# Patient Record
Sex: Male | Born: 1945 | Race: White | Hispanic: No | Marital: Married | Smoking: Former smoker
Health system: Southern US, Community
[De-identification: ages and names within clinical notes are randomized; demographics above are authoritative.]

## PROBLEM LIST (undated history)

## (undated) DIAGNOSIS — I779 Disorder of arteries and arterioles, unspecified: Secondary | ICD-10-CM

## (undated) DIAGNOSIS — E119 Type 2 diabetes mellitus without complications: Secondary | ICD-10-CM

## (undated) DIAGNOSIS — I739 Peripheral vascular disease, unspecified: Secondary | ICD-10-CM

## (undated) DIAGNOSIS — I10 Essential (primary) hypertension: Secondary | ICD-10-CM

## (undated) DIAGNOSIS — I519 Heart disease, unspecified: Secondary | ICD-10-CM

## (undated) DIAGNOSIS — Z862 Personal history of diseases of the blood and blood-forming organs and certain disorders involving the immune mechanism: Secondary | ICD-10-CM

## (undated) DIAGNOSIS — I119 Hypertensive heart disease without heart failure: Secondary | ICD-10-CM

## (undated) DIAGNOSIS — I77811 Abdominal aortic ectasia: Secondary | ICD-10-CM

## (undated) DIAGNOSIS — I2510 Atherosclerotic heart disease of native coronary artery without angina pectoris: Secondary | ICD-10-CM

## (undated) DIAGNOSIS — Z720 Tobacco use: Secondary | ICD-10-CM

## (undated) DIAGNOSIS — Z9289 Personal history of other medical treatment: Secondary | ICD-10-CM

## (undated) DIAGNOSIS — E785 Hyperlipidemia, unspecified: Secondary | ICD-10-CM

## (undated) DIAGNOSIS — Z9889 Other specified postprocedural states: Secondary | ICD-10-CM

## (undated) HISTORY — DX: Essential (primary) hypertension: I10

## (undated) HISTORY — PX: CARDIAC CATHETERIZATION: SHX172

## (undated) HISTORY — PX: CORONARY STENT PLACEMENT: SHX1402

## (undated) HISTORY — PX: CORONARY ANGIOPLASTY: SHX604

## (undated) HISTORY — DX: Abdominal aortic ectasia: I77.811

## (undated) HISTORY — DX: Atherosclerotic heart disease of native coronary artery without angina pectoris: I25.10

## (undated) HISTORY — DX: Personal history of other medical treatment: Z92.89

## (undated) HISTORY — DX: Type 2 diabetes mellitus without complications: E11.9

## (undated) HISTORY — PX: CAROTID ENDARTERECTOMY: SUR193

## (undated) HISTORY — DX: Personal history of diseases of the blood and blood-forming organs and certain disorders involving the immune mechanism: Z86.2

## (undated) HISTORY — DX: Heart disease, unspecified: I51.9

## (undated) HISTORY — DX: Other specified postprocedural states: Z98.890

## (undated) HISTORY — DX: Hyperlipidemia, unspecified: E78.5

---

## 2013-04-07 DIAGNOSIS — Z8679 Personal history of other diseases of the circulatory system: Secondary | ICD-10-CM | POA: Diagnosis not present

## 2013-04-07 DIAGNOSIS — I10 Essential (primary) hypertension: Secondary | ICD-10-CM | POA: Diagnosis not present

## 2013-04-07 DIAGNOSIS — I252 Old myocardial infarction: Secondary | ICD-10-CM | POA: Diagnosis not present

## 2013-04-07 DIAGNOSIS — Z125 Encounter for screening for malignant neoplasm of prostate: Secondary | ICD-10-CM | POA: Diagnosis not present

## 2013-04-07 DIAGNOSIS — I2510 Atherosclerotic heart disease of native coronary artery without angina pectoris: Secondary | ICD-10-CM | POA: Diagnosis not present

## 2013-04-07 DIAGNOSIS — E7800 Pure hypercholesterolemia, unspecified: Secondary | ICD-10-CM | POA: Diagnosis not present

## 2013-04-07 DIAGNOSIS — R7309 Other abnormal glucose: Secondary | ICD-10-CM | POA: Diagnosis not present

## 2013-04-10 DIAGNOSIS — E119 Type 2 diabetes mellitus without complications: Secondary | ICD-10-CM | POA: Diagnosis not present

## 2013-04-18 ENCOUNTER — Ambulatory Visit (INDEPENDENT_AMBULATORY_CARE_PROVIDER_SITE_OTHER): Payer: Medicare Other | Admitting: Internal Medicine

## 2013-04-18 ENCOUNTER — Encounter: Payer: Self-pay | Admitting: Internal Medicine

## 2013-04-18 VITALS — BP 120/86 | HR 73 | Ht 71.0 in | Wt 235.4 lb

## 2013-04-18 DIAGNOSIS — I6529 Occlusion and stenosis of unspecified carotid artery: Secondary | ICD-10-CM | POA: Diagnosis not present

## 2013-04-18 DIAGNOSIS — E785 Hyperlipidemia, unspecified: Secondary | ICD-10-CM

## 2013-04-18 DIAGNOSIS — I2510 Atherosclerotic heart disease of native coronary artery without angina pectoris: Secondary | ICD-10-CM

## 2013-04-18 DIAGNOSIS — I6521 Occlusion and stenosis of right carotid artery: Secondary | ICD-10-CM

## 2013-04-18 NOTE — Patient Instructions (Addendum)
Your physician recommends that you schedule a follow-up appointment WU:XLKG  WITH DR ROSS Your physician recommends that you continue on your current medications as directed. Please refer to the Current Medication list given to you today.  Your physician has requested that you have a lexiscan myoview. For further information please visit [email or web address] Please follow instruction sheet, as given.  Your physician has requested that you have a carotid duplex. This test is an ultrasound of the carotid arteries in your neck. It looks at blood flow through these arteries that supply the brain with blood. Allow one hour for this exam. There are no restrictions or special instructions.

## 2013-04-18 NOTE — Progress Notes (Signed)
HPI Patient is a 67 yo who presents for continued cardiac care  He was referred by Dr Zachery Dauer at [place] He has a history of CAD  While in [place] he had an MI in 2012.   He remembers one Friday having chest pressure  Took ASA  On Saturday and Sunday pressure continued  On Mon when he went to work he was checked out  Went to local hospital and then transferred to the [facility]. He was told he had a small amount of damage to his heart  He stayed in the ICU for 1 wk and then had a cardiac cath  He received 2 stents the the artery on the right side of his heart.  Does not know about the other vessels.   He was followed by the head of cardiology after  Had an echo  Was last seen in clinic in March 2014 He denies chest pressure  Does get tired with activty  No PND  No signif palpitations Continues to smoke  Cut back to 1/2 ppd   No Known Allergies  Current Outpatient Prescriptions  Medication Sig Dispense Refill  . aspirin 81 MG tablet Take 81 mg by mouth daily.      . irbesartan (AVAPRO) 150 MG tablet Take 150 mg by mouth daily.      . rosuvastatin (CRESTOR) 10 MG tablet Take 10 mg by mouth daily.       No current facility-administered medications for this visit.    No past medical history on file.  No past surgical history on file.  No family history on file.  History   Social History  . Marital Status: Married    Spouse Name: N/A    Number of Children: N/A  . Years of Education: N/A   Occupational History  . Not on file.   Social History Main Topics  . Smoking status: Current Every Day Smoker -- 0.50 packs/day for 40 years    Types: Cigarettes    Start date: 06/18/1972  . Smokeless tobacco: Not on file  . Alcohol Use: Not on file  . Drug Use: Not on file  . Sexual Activity: Not on file   Other Topics Concern  . Not on file   Social History Narrative  . No narrative on file    Review of Systems:  All systems reviewed.  They are negative to the above  problem except as previously stated.  Vital Signs: BP 120/86  Pulse 73  Ht 5\' 11"  (1.803 m)  Wt 235 lb 6.4 oz (106.777 kg)  BMI 32.85 kg/m2  Physical Exam Patient is in NAD HEENT:  Normocephalic, atraumatic. EOMI, PERRLA.  Neck: JVP is normal.  No bruits. Scar R CEA Lungs: clear to auscultation. No rales no wheezes.  Heart: Regular rate and rhythm. Normal S1, S2. No S3.   No significant murmurs. PMI not displaced.  Abdomen:  Supple, nontender. Normal bowel sounds. No masses. No hepatomegaly.  Extremities:   Good distal pulses throughout. No lower extremity edema.  Musculoskeletal :moving all extremities.  Neuro:   alert and oriented x3.  CN II-XII grossly intact.  EKG  SR 73   Assessment and Plan:  1.  CAd  Patient with stent x 2 two years ago.  He denies CP but does give out with activtuy  I would get a stress myoview to look for residual ischemia.  Keep on same meds  If normal i would ecourage him to get more active.  Consider echo if EF down  2.  PVOD  Will get carotid USN  Had CEA in [place] in 2006  No F/U there after Would get abd USN to screen for AAA since he is obese  3.  HL  Lipids are pretty good  LDL 80  Encouraged wt loss and exercise  4.  HTN  Good control  5.  Tobacco  COunselled on quitting  He has cut back  He will discuss with Dr Zachery Dauer Chantix.

## 2013-04-21 ENCOUNTER — Ambulatory Visit (HOSPITAL_COMMUNITY): Payer: Medicare Other | Attending: Internal Medicine

## 2013-04-21 DIAGNOSIS — I6529 Occlusion and stenosis of unspecified carotid artery: Secondary | ICD-10-CM | POA: Insufficient documentation

## 2013-04-21 DIAGNOSIS — I6521 Occlusion and stenosis of right carotid artery: Secondary | ICD-10-CM

## 2013-04-22 DIAGNOSIS — D72829 Elevated white blood cell count, unspecified: Secondary | ICD-10-CM | POA: Diagnosis not present

## 2013-04-22 DIAGNOSIS — E119 Type 2 diabetes mellitus without complications: Secondary | ICD-10-CM | POA: Diagnosis not present

## 2013-04-22 DIAGNOSIS — E875 Hyperkalemia: Secondary | ICD-10-CM | POA: Diagnosis not present

## 2013-04-28 ENCOUNTER — Telehealth: Payer: Self-pay | Admitting: Internal Medicine

## 2013-04-28 DIAGNOSIS — Z9289 Personal history of other medical treatment: Secondary | ICD-10-CM

## 2013-04-28 HISTORY — DX: Personal history of other medical treatment: Z92.89

## 2013-04-28 NOTE — Telephone Encounter (Signed)
C/D 04/28/13 for appt. 05/26/13

## 2013-04-28 NOTE — Telephone Encounter (Signed)
S/W PT AND GVE NP APPT 12/29 @ 1:30 W/DR. CHISM REFERRING DR. BARNES DX- ELEVATED WBC  WELCOME PACKET MAILED,.

## 2013-05-05 ENCOUNTER — Telehealth: Payer: Self-pay | Admitting: Internal Medicine

## 2013-05-05 NOTE — Telephone Encounter (Signed)
New message    Coming in wed for nuclear stress test---pcp changed bp medication last thurs----b/p is 148/79 which is high for him.  Should he wait and have stress test later when his bp is more stable?

## 2013-05-05 NOTE — Telephone Encounter (Signed)
Spoke with pt, his medical doctor changed his avapro to metoprolol succ 25 mg daily due to hyperkalemia. His bp is running in the 140's/70's. He wants to [facility] sure okay to cont with the stress testing this week. Will discuss with dr Tenny Craw.

## 2013-05-06 NOTE — Telephone Encounter (Signed)
Left message for pt, discussed with dr Tenny Craw, she would like the pt to have his stress test tomorrow. He will take his metoprolol in the morning for bp control.

## 2013-05-07 ENCOUNTER — Ambulatory Visit (HOSPITAL_COMMUNITY): Payer: Medicare Other | Attending: Internal Medicine | Admitting: Radiology

## 2013-05-07 VITALS — BP 122/73 | Ht 71.0 in | Wt 233.0 lb

## 2013-05-07 DIAGNOSIS — E785 Hyperlipidemia, unspecified: Secondary | ICD-10-CM | POA: Insufficient documentation

## 2013-05-07 DIAGNOSIS — R5381 Other malaise: Secondary | ICD-10-CM | POA: Insufficient documentation

## 2013-05-07 DIAGNOSIS — F17200 Nicotine dependence, unspecified, uncomplicated: Secondary | ICD-10-CM | POA: Insufficient documentation

## 2013-05-07 DIAGNOSIS — I2510 Atherosclerotic heart disease of native coronary artery without angina pectoris: Secondary | ICD-10-CM

## 2013-05-07 DIAGNOSIS — I779 Disorder of arteries and arterioles, unspecified: Secondary | ICD-10-CM | POA: Insufficient documentation

## 2013-05-07 DIAGNOSIS — I252 Old myocardial infarction: Secondary | ICD-10-CM | POA: Diagnosis not present

## 2013-05-07 DIAGNOSIS — I10 Essential (primary) hypertension: Secondary | ICD-10-CM | POA: Insufficient documentation

## 2013-05-07 MED ORDER — TECHNETIUM TC 99M SESTAMIBI GENERIC - CARDIOLITE
11.0000 | Freq: Once | INTRAVENOUS | Status: AC | PRN
  Administered 2013-05-07: 11 via INTRAVENOUS

## 2013-05-07 MED ORDER — TECHNETIUM TC 99M SESTAMIBI GENERIC - CARDIOLITE
33.0000 | Freq: Once | INTRAVENOUS | Status: AC | PRN
  Administered 2013-05-07: 33 via INTRAVENOUS

## 2013-05-07 MED ORDER — REGADENOSON 0.4 MG/5ML IV SOLN
0.4000 mg | Freq: Once | INTRAVENOUS | Status: AC
  Administered 2013-05-07: 0.4 mg via INTRAVENOUS

## 2013-05-07 NOTE — Progress Notes (Signed)
[facility] SITE 3 NUCLEAR MED [address] [place], [place] [address] [phone]    Cardiology Nuclear Med Study  Randy [name] is a 67 y.o. male     MRN : [id number]     DOB: 05/27/1946  Procedure Date: 05/07/2013  Nuclear Med Background Indication for Stress Test:  Evaluation for Ischemia and Stent Patency History:  1990 MPI: [place] MPI: [facility] some blockage per pt, Cath: '12 [place] -Stent Multiple Cardiac Risk Factors: Carotid Disease, Hypertension, Lipids and Smoker  Symptoms:  Fatigue   Nuclear Pre-Procedure Caffeine/Decaff Intake:  None > 12 hrs NPO After: 9:00pm   Lungs:  clear O2 Sat: 99% on room air. IV 0.9% NS with Angio Cath:  22g  IV Site: L Forearm x 1, tolerated well IV Started by:  Randy Hong, RN  Chest Size (in):  46 Cup Size: n/a  Height: 5\' 11"  (1.803 m)  Weight:  233 lb (105.688 kg)  BMI:  Body mass index is 32.51 kg/(m^2). Tech Comments:  Took am medications    Nuclear Med Study 1 or 2 day study: 1 day  Stress Test Type:  Lexiscan  Reading MD: Randy Miss, MD  Order Authorizing Provider:  Dietrich Pates, MD  Resting Radionuclide: Technetium 51m Sestamibi  Resting Radionuclide Dose: 11.0 mCi   Stress Radionuclide:  Technetium 28m Sestamibi  Stress Radionuclide Dose: 33.0 mCi           Stress Protocol Rest HR: 57 Stress HR: 76  Rest BP: 122/73 Stress BP: 147/91  Exercise Time (min): n/a METS: n/a   Predicted Max HR: 153 bpm % Max HR: 49.67 bpm Rate Pressure Product: 21308   Dose of Adenosine (mg):  n/a Dose of Lexiscan: 0.4 mg  Dose of Atropine (mg): n/a Dose of Dobutamine: n/a mcg/kg/min (at max HR)  Stress Test Technologist: Randy [name], EMT-P  Nuclear Technologist:  Randy [name], CNMT     Rest Procedure:  Myocardial perfusion imaging was performed at rest 45 minutes following the intravenous administration of Technetium 81m Sestamibi. Rest ECG: NSR - Normal EKG  Stress Procedure:  The patient received IV  Lexiscan 0.4 mg over 15-seconds.  Technetium 52m Sestamibi injected at 30-seconds. This patient was sob and felt funny with the Lexiscan injection. Quantitative spect images were obtained after a 45 minute delay. Stress ECG: No significant change from baseline ECG  QPS Raw Data Images:  Normal; no motion artifact; normal heart/lung ratio. Stress Images:  The LV is moderately dilated.  There is a large severe defect in the mid /basal inferiolateral wall.   the uptake in the other regions is normal.    Rest Images:  The LV is moderately dilated.  There is a large severe defect in the mid /basal inferiolateral wall.   the uptake in the other regions is normal. Subtraction (SDS):  No evidence of ischemia.  There is evidence of a previous inferiolateral MI  Transient Ischemic Dilatation (Normal <1.22):  1.00 Lung/Heart Ratio (Normal <0.45):  0.35  Quantitative Gated Spect Images QGS EDV:  150 ml QGS ESV:  85 ml  Impression Exercise Capacity:  Lexiscan with no exercise. BP Response:  Normal blood pressure response. Clinical Symptoms:  No significant symptoms noted. ECG Impression:  No significant ST segment change suggestive of ischemia. Comparison with Prior Nuclear Study: No images to compare  Overall Impression:  Intermediate risk stress nuclear study .  There is evidence of a previous inferiolateral MI.  There is no ischemia.  The  LV function is mildlly depressed and the LV is moderately enlarged.   There is hypokinesis of the inferiolateral wall. .  LV Ejection Fraction: 45%.  LV Wall Motion:  The LV function is mildlly depressed and the LV is moderately enlarged.   There is hypokinesis of the inferiolateral wall. Randy [name], Randy [name]., MD, [facility] 05/07/2013, 4:34 PM Office - [phone] Pager [phone]

## 2013-05-12 ENCOUNTER — Other Ambulatory Visit: Payer: Self-pay | Admitting: *Deleted

## 2013-05-12 DIAGNOSIS — R9430 Abnormal result of cardiovascular function study, unspecified: Secondary | ICD-10-CM

## 2013-05-15 DIAGNOSIS — E875 Hyperkalemia: Secondary | ICD-10-CM | POA: Diagnosis not present

## 2013-05-26 ENCOUNTER — Ambulatory Visit: Payer: No Typology Code available for payment source

## 2013-05-26 ENCOUNTER — Other Ambulatory Visit: Payer: No Typology Code available for payment source

## 2013-05-27 ENCOUNTER — Encounter: Payer: Self-pay | Admitting: Cardiovascular Disease

## 2013-05-27 ENCOUNTER — Ambulatory Visit (HOSPITAL_COMMUNITY): Payer: Medicare Other | Attending: Cardiovascular Disease | Admitting: Radiology

## 2013-05-27 DIAGNOSIS — I079 Rheumatic tricuspid valve disease, unspecified: Secondary | ICD-10-CM | POA: Diagnosis not present

## 2013-05-27 DIAGNOSIS — R9430 Abnormal result of cardiovascular function study, unspecified: Secondary | ICD-10-CM

## 2013-05-27 DIAGNOSIS — I2510 Atherosclerotic heart disease of native coronary artery without angina pectoris: Secondary | ICD-10-CM | POA: Diagnosis not present

## 2013-05-27 DIAGNOSIS — I080 Rheumatic disorders of both mitral and aortic valves: Secondary | ICD-10-CM | POA: Insufficient documentation

## 2013-05-27 DIAGNOSIS — I679 Cerebrovascular disease, unspecified: Secondary | ICD-10-CM | POA: Insufficient documentation

## 2013-05-27 DIAGNOSIS — F17200 Nicotine dependence, unspecified, uncomplicated: Secondary | ICD-10-CM | POA: Diagnosis not present

## 2013-05-27 DIAGNOSIS — I10 Essential (primary) hypertension: Secondary | ICD-10-CM | POA: Diagnosis not present

## 2013-05-27 DIAGNOSIS — E785 Hyperlipidemia, unspecified: Secondary | ICD-10-CM | POA: Insufficient documentation

## 2013-05-27 DIAGNOSIS — I379 Nonrheumatic pulmonary valve disorder, unspecified: Secondary | ICD-10-CM | POA: Insufficient documentation

## 2013-05-27 NOTE — Progress Notes (Signed)
Echocardiogram performed.  

## 2013-06-03 ENCOUNTER — Other Ambulatory Visit (HOSPITAL_COMMUNITY): Payer: No Typology Code available for payment source

## 2013-10-23 DIAGNOSIS — I252 Old myocardial infarction: Secondary | ICD-10-CM | POA: Diagnosis not present

## 2013-10-23 DIAGNOSIS — I10 Essential (primary) hypertension: Secondary | ICD-10-CM | POA: Diagnosis not present

## 2013-10-23 DIAGNOSIS — Z8679 Personal history of other diseases of the circulatory system: Secondary | ICD-10-CM | POA: Diagnosis not present

## 2013-10-23 DIAGNOSIS — E7800 Pure hypercholesterolemia, unspecified: Secondary | ICD-10-CM | POA: Diagnosis not present

## 2013-10-23 DIAGNOSIS — E875 Hyperkalemia: Secondary | ICD-10-CM | POA: Diagnosis not present

## 2013-10-23 DIAGNOSIS — E119 Type 2 diabetes mellitus without complications: Secondary | ICD-10-CM | POA: Diagnosis not present

## 2013-10-23 DIAGNOSIS — D72829 Elevated white blood cell count, unspecified: Secondary | ICD-10-CM | POA: Diagnosis not present

## 2014-03-03 ENCOUNTER — Encounter: Payer: Self-pay | Admitting: Internal Medicine

## 2014-04-03 ENCOUNTER — Telehealth: Payer: Self-pay | Admitting: Internal Medicine

## 2014-04-03 NOTE — Telephone Encounter (Signed)
Advised will discuss with Dr. Harrington Challenger and will let him know.

## 2014-04-03 NOTE — Telephone Encounter (Signed)
New Message  Pt called requets a call back to discuss why a carotid is needed. Please call

## 2014-04-03 NOTE — Telephone Encounter (Signed)
Reviewed need for carotid doppler with patient. He wants to know if Dr. Harrington Challenger would like any other test done prior to his appointment next month.

## 2014-04-07 NOTE — Telephone Encounter (Signed)
Pt advised due for carotid doppler after 04/21/14 this year (1 year from last carotid) , transferred to [facility] scheduler to schedule carotid prior to 05/11/14 appt with Dr Harrington Challenger.  Pt states he will discuss need for further testing with Dr Harrington Challenger at time of appt with her 05/11/14

## 2014-04-07 NOTE — Telephone Encounter (Signed)
Follow up      Pt want to talk to the nurse---he did not hear back from her from a few days ago

## 2014-04-14 DIAGNOSIS — I10 Essential (primary) hypertension: Secondary | ICD-10-CM | POA: Diagnosis not present

## 2014-04-14 DIAGNOSIS — E780 Pure hypercholesterolemia: Secondary | ICD-10-CM | POA: Diagnosis not present

## 2014-04-14 DIAGNOSIS — I252 Old myocardial infarction: Secondary | ICD-10-CM | POA: Diagnosis not present

## 2014-04-14 DIAGNOSIS — Z8679 Personal history of other diseases of the circulatory system: Secondary | ICD-10-CM | POA: Diagnosis not present

## 2014-04-14 DIAGNOSIS — E119 Type 2 diabetes mellitus without complications: Secondary | ICD-10-CM | POA: Diagnosis not present

## 2014-04-14 DIAGNOSIS — I2510 Atherosclerotic heart disease of native coronary artery without angina pectoris: Secondary | ICD-10-CM | POA: Diagnosis not present

## 2014-04-15 DIAGNOSIS — Z23 Encounter for immunization: Secondary | ICD-10-CM | POA: Diagnosis not present

## 2014-04-15 DIAGNOSIS — R7989 Other specified abnormal findings of blood chemistry: Secondary | ICD-10-CM | POA: Diagnosis not present

## 2014-04-15 DIAGNOSIS — Z1211 Encounter for screening for malignant neoplasm of colon: Secondary | ICD-10-CM | POA: Diagnosis not present

## 2014-04-15 DIAGNOSIS — E875 Hyperkalemia: Secondary | ICD-10-CM | POA: Diagnosis not present

## 2014-04-17 ENCOUNTER — Other Ambulatory Visit (HOSPITAL_COMMUNITY): Payer: Self-pay | Admitting: *Deleted

## 2014-04-17 DIAGNOSIS — I6523 Occlusion and stenosis of bilateral carotid arteries: Secondary | ICD-10-CM

## 2014-05-04 ENCOUNTER — Encounter (HOSPITAL_COMMUNITY): Payer: No Typology Code available for payment source

## 2014-05-11 ENCOUNTER — Ambulatory Visit: Payer: No Typology Code available for payment source | Admitting: Internal Medicine

## 2014-06-05 ENCOUNTER — Ambulatory Visit (HOSPITAL_COMMUNITY): Payer: Medicare Other | Attending: Cardiovascular Disease | Admitting: *Deleted

## 2014-06-05 DIAGNOSIS — I6523 Occlusion and stenosis of bilateral carotid arteries: Secondary | ICD-10-CM

## 2014-06-05 NOTE — Progress Notes (Signed)
Carotid Duplex Exam Performed 

## 2014-06-12 ENCOUNTER — Ambulatory Visit (INDEPENDENT_AMBULATORY_CARE_PROVIDER_SITE_OTHER): Payer: Medicare Other | Admitting: Internal Medicine

## 2014-06-12 ENCOUNTER — Encounter: Payer: Self-pay | Admitting: Internal Medicine

## 2014-06-12 VITALS — BP 162/88 | HR 58 | Ht 71.5 in | Wt 222.0 lb

## 2014-06-12 DIAGNOSIS — I6523 Occlusion and stenosis of bilateral carotid arteries: Secondary | ICD-10-CM | POA: Diagnosis not present

## 2014-06-12 DIAGNOSIS — I10 Essential (primary) hypertension: Secondary | ICD-10-CM

## 2014-06-12 DIAGNOSIS — E785 Hyperlipidemia, unspecified: Secondary | ICD-10-CM | POA: Diagnosis not present

## 2014-06-12 NOTE — Patient Instructions (Signed)
**Note De-Identified  Obfuscation** Your physician recommends that you continue on your current medications as directed. Please refer to the Current Medication list given to you today.  Your physician recommends that you schedule a follow-up appointment in: BP check in 1 month  Your physician wants you to follow-up in: 1 year. You will receive a reminder letter in the mail two months in advance. If you don't receive a letter, please call our office to schedule the follow-up appointment.

## 2014-06-12 NOTE — Progress Notes (Signed)
HPI Patient is a 55 with history of CAD  S/P MI in 2012 in [place]    After I saw him last he had a myoview scan that showed inferior scar.  Echo dones that showed LVEF was normal   Since seen he denies CP  Breathing is OK   Seen by Dr Drema Dallas recently  Labs done.   Still smoking 1/2 ppd     No Known Allergies  Current Outpatient Prescriptions  Medication Sig Dispense Refill  . aspirin 81 MG tablet Take 81 mg by mouth daily.    . metoprolol succinate (TOPROL-XL) 25 MG 24 hr tablet Take 25 mg by mouth daily.    . rosuvastatin (CRESTOR) 10 MG tablet Take 10 mg by mouth daily.     No current facility-administered medications for this visit.    Past Medical History  Diagnosis Date  . Carotid artery occlusion   . Coronary artery disease   . Hypertension   . Hyperlipidemia     Past Surgical History  Procedure Laterality Date  . Cardiac catheterization    . Carotid endarterectomy    . Coronary angioplasty      Family History  Problem Relation Age of Onset  . Hypertension Father     History   Social History  . Marital Status: Married    Spouse Name: ardith    Number of Children: 7  . Years of Education: college   Occupational History  . retired    Social History Main Topics  . Smoking status: Current Every Day Smoker -- 0.50 packs/day for 40 years    Types: Cigarettes    Start date: 06/18/1972  . Smokeless tobacco: Not on file  . Alcohol Use: No  . Drug Use: No  . Sexual Activity: Not on file   Other Topics Concern  . Not on file   Social History Narrative    Review of Systems:  All systems reviewed.  They are negative to the above problem except as previously stated.  Vital Signs: BP 162/88 mmHg  Pulse 58  Ht 5' 11.5" (1.816 m)  Wt 222 lb (100.699 kg)  BMI 30.53 kg/m2  Physical Exam Patient is in NAD HEENT:  Normocephalic, atraumatic. EOMI, PERRLA.  Neck: JVP is normal.  No bruits. Scar R CEA Lungs: clear to auscultation. No rales no wheezes.   Heart: Regular rate and rhythm. Normal S1, S2. No S3.   No significant murmurs. PMI not displaced.  Abdomen:  Supple, nontender. Normal bowel sounds. No masses. No hepatomegaly.  Extremities:   Good distal pulses throughout. No lower extremity edema.  Musculoskeletal :moving all extremities.  Neuro:   alert and oriented x3.  CN II-XII grossly intact. Assessment and Plan:  1.  CAd  Patient with stent x 2 two years ago.Myoview wint inferior scar  LVEF normal  FOllow   2.  PVOD  F/U carotid USN in 1 year  3.  HL  Will get labs from Dr Drema Dallas' office  4.  HTN BP is high  Will have him take at home  F?U in 1 month  Bring log    5.  Tobacco  COunselled again on smoking cessation

## 2014-07-13 ENCOUNTER — Ambulatory Visit (INDEPENDENT_AMBULATORY_CARE_PROVIDER_SITE_OTHER): Payer: Medicare Other

## 2014-07-13 VITALS — BP 170/80 | HR 65 | Resp 20 | Ht 71.5 in | Wt 224.0 lb

## 2014-07-13 DIAGNOSIS — I10 Essential (primary) hypertension: Secondary | ICD-10-CM | POA: Diagnosis not present

## 2014-07-13 NOTE — Patient Instructions (Signed)
Your physician recommends that you continue on your current medications as directed. Please refer to the Current Medication list given to you today.     

## 2014-07-13 NOTE — Progress Notes (Signed)
1.) Reason for visit: BP check  2.) Name of MD requesting visit: Dr. Harrington Challenger  3.) H&P: Patient has a history CAD, carotid artery occlusion, HTN, and hyperlipidemia. Patient's vital signs BP 170/80, HR 65, and O2 100% on room air. Patient denies any chest pain or SOB.   4.) ROS related to problem: Patient's BP is elevated here at the office, but was 126/82 this am with HR 72. Patient stated that he had 3 cups of strong coffee this am, plus the weather is bad with snow last night.   5.) Assessment and plan per MD: Showed vital signs to Truitt Merle NP (FLEX), after reviewing patient's latest record of BPs in the last month from home, she would not make any changes. Elevated BP maybe due to coming out in this weather and being in the doctor's office. Will forward to Dr. Harrington Challenger for further instructions.

## 2014-07-13 NOTE — Progress Notes (Signed)
Patient came in fro BP check  Our cuff and his cuff with fair correlation BP good at home  I would recomm that he continue to keep log. He should bring in his cuff and bp log when comes into clinic again.

## 2014-10-15 ENCOUNTER — Other Ambulatory Visit: Payer: Self-pay | Admitting: Family Medicine

## 2014-10-15 DIAGNOSIS — E780 Pure hypercholesterolemia: Secondary | ICD-10-CM | POA: Diagnosis not present

## 2014-10-15 DIAGNOSIS — Z1389 Encounter for screening for other disorder: Secondary | ICD-10-CM | POA: Diagnosis not present

## 2014-10-15 DIAGNOSIS — I10 Essential (primary) hypertension: Secondary | ICD-10-CM | POA: Diagnosis not present

## 2014-10-15 DIAGNOSIS — R0989 Other specified symptoms and signs involving the circulatory and respiratory systems: Secondary | ICD-10-CM | POA: Diagnosis not present

## 2014-10-15 DIAGNOSIS — E119 Type 2 diabetes mellitus without complications: Secondary | ICD-10-CM

## 2014-10-19 ENCOUNTER — Ambulatory Visit
Admission: RE | Admit: 2014-10-19 | Discharge: 2014-10-19 | Disposition: A | Discharge status: Home / Self Care | Payer: Medicare Other | Source: Ambulatory Visit | Attending: Family Medicine | Admitting: Family Medicine

## 2014-10-19 DIAGNOSIS — E119 Type 2 diabetes mellitus without complications: Secondary | ICD-10-CM

## 2014-10-19 DIAGNOSIS — R0989 Other specified symptoms and signs involving the circulatory and respiratory systems: Secondary | ICD-10-CM | POA: Diagnosis not present

## 2015-05-17 ENCOUNTER — Other Ambulatory Visit: Payer: Self-pay | Admitting: Internal Medicine

## 2015-05-17 DIAGNOSIS — I6523 Occlusion and stenosis of bilateral carotid arteries: Secondary | ICD-10-CM

## 2015-06-07 ENCOUNTER — Inpatient Hospital Stay (HOSPITAL_COMMUNITY): Admission: RE | Admit: 2015-06-07 | Payer: Medicare Other | Source: Ambulatory Visit

## 2015-06-08 ENCOUNTER — Ambulatory Visit (HOSPITAL_COMMUNITY)
Admission: RE | Admit: 2015-06-08 | Discharge: 2015-06-08 | Disposition: A | Discharge status: Home / Self Care | Payer: Medicare Other | Source: Ambulatory Visit | Attending: Cardiology | Admitting: Cardiology

## 2015-06-08 DIAGNOSIS — E785 Hyperlipidemia, unspecified: Secondary | ICD-10-CM | POA: Insufficient documentation

## 2015-06-08 DIAGNOSIS — I10 Essential (primary) hypertension: Secondary | ICD-10-CM | POA: Diagnosis not present

## 2015-06-08 DIAGNOSIS — I6523 Occlusion and stenosis of bilateral carotid arteries: Secondary | ICD-10-CM

## 2015-07-09 DIAGNOSIS — Z1211 Encounter for screening for malignant neoplasm of colon: Secondary | ICD-10-CM | POA: Diagnosis not present

## 2015-07-09 DIAGNOSIS — E7800 Pure hypercholesterolemia, unspecified: Secondary | ICD-10-CM | POA: Diagnosis not present

## 2015-07-09 DIAGNOSIS — I10 Essential (primary) hypertension: Secondary | ICD-10-CM | POA: Diagnosis not present

## 2015-07-09 DIAGNOSIS — E119 Type 2 diabetes mellitus without complications: Secondary | ICD-10-CM | POA: Diagnosis not present

## 2015-07-16 ENCOUNTER — Ambulatory Visit: Payer: Medicare Other | Admitting: Internal Medicine

## 2015-08-05 ENCOUNTER — Ambulatory Visit: Payer: Medicare Other | Admitting: Internal Medicine

## 2015-08-19 DIAGNOSIS — Z79899 Other long term (current) drug therapy: Secondary | ICD-10-CM | POA: Diagnosis not present

## 2015-08-19 DIAGNOSIS — I10 Essential (primary) hypertension: Secondary | ICD-10-CM | POA: Diagnosis not present

## 2015-08-23 ENCOUNTER — Ambulatory Visit (INDEPENDENT_AMBULATORY_CARE_PROVIDER_SITE_OTHER): Payer: Medicare Other | Admitting: Internal Medicine

## 2015-08-23 ENCOUNTER — Encounter: Payer: Self-pay | Admitting: Internal Medicine

## 2015-08-23 VITALS — BP 138/78 | HR 70 | Ht 71.5 in | Wt 225.0 lb

## 2015-08-23 DIAGNOSIS — I10 Essential (primary) hypertension: Secondary | ICD-10-CM | POA: Diagnosis not present

## 2015-08-23 DIAGNOSIS — I6523 Occlusion and stenosis of bilateral carotid arteries: Secondary | ICD-10-CM | POA: Diagnosis not present

## 2015-08-23 MED ORDER — IRBESARTAN-HYDROCHLOROTHIAZIDE 150-12.5 MG PO TABS: 0.5000 | ORAL_TABLET | Freq: Every day | ORAL | Status: DC

## 2015-08-23 MED ORDER — VARENICLINE TARTRATE 0.5 MG PO TABS: 0.5000 mg | ORAL_TABLET | Freq: Two times a day (BID) | ORAL | Status: DC

## 2015-08-23 MED ORDER — METOPROLOL SUCCINATE ER 25 MG PO TB24: 12.5000 mg | ORAL_TABLET | Freq: Every day | ORAL | Status: DC

## 2015-08-23 NOTE — Patient Instructions (Signed)
Your physician recommends that you continue on your current medications as directed. Please refer to the Current Medication list given to you today. Your physician wants you to follow-up in: 1 year with Dr. Harrington Challenger.  You will receive a reminder letter in the mail two months in advance. If you don't receive a letter, please call our office to schedule the follow-up appointment. Your physician wants you to follow-up in: January, 2018 for carotid doppler.   You will receive a reminder letter in the mail two months in advance. If you don't receive a letter, please call our office to schedule the follow-up appointment.

## 2015-08-23 NOTE — Progress Notes (Signed)
Cardiology Office Note   Date:  08/23/2015   ID:  Randy [name], DOB [birth year], MRN [id number]  PCP:  Gerrit Heck, MD  Cardiologist:   [name], MD   No chief complaint on file.  F/U of CAD     History of Present Illness: Randy [name] is a 70 y.o. male with a history of CAD  S/p MI in 2012 in [place]  Myoview with inferior scar  LVEF on echo was normal  I saw him in Jan 2016 Carotids showed mod plaquing       Just got back from [place]    Had physical with dr Drema Dallas before he left  150/ In [place] BP was very high  Stayed high for a few days   Seen by physcian there Started on  Irbasarten/ HCTZ  150/12.5    Takes 1/2 every day    Takes 1/2 metoprolol  (Toprol XL)  That was started 3 wks ago   Breathing OK  No CP   Had pressure when BP was up   Walking in [place]  Still smoking   Outpatient Prescriptions Prior to Visit  Medication Sig Dispense Refill  . aspirin 81 MG tablet Take 81 mg by mouth daily.    . rosuvastatin (CRESTOR) 10 MG tablet Take 10 mg by mouth daily.    . metoprolol succinate (TOPROL-XL) 25 MG 24 hr tablet Take 25 mg by mouth daily. Reported on 08/23/2015     No facility-administered medications prior to visit.     Allergies:   Review of patient's allergies indicates no known allergies.   Past Medical History  Diagnosis Date  . Carotid artery occlusion   . Coronary artery disease   . Hypertension   . Hyperlipidemia     Past Surgical History  Procedure Laterality Date  . Cardiac catheterization    . Carotid endarterectomy    . Coronary angioplasty       Social History:  The patient  reports that he has been smoking Cigarettes.  He started smoking about 43 years ago. He has a 20 pack-year smoking history. He does not have any smokeless tobacco history on file. He reports that he does not drink alcohol or use illicit drugs.   Family History:  The patient's family history includes Hypertension in his father.    ROS:  Please  see the history of present illness. All other systems are reviewed and  Negative to the above problem except as noted.    PHYSICAL EXAM: VS:  BP 138/78 mmHg  Pulse 70  Ht 5' 11.5" (1.816 m)  Wt 225 lb (102.059 kg)  BMI 30.95 kg/m2  GEN: Well nourished, well developed, in no acute distress HEENT: normal Neck: no JVD, carotid bruits, or masses Cardiac: RRR; no murmurs, rubs, or gallops,no edema  Respiratory:  clear to auscultation bilaterally, normal work of breathing GI: soft, nontender, nondistended, + BS  No hepatomegaly  MS: no deformity Moving all extremities   Skin: warm and dry, no rash Neuro:  Strength and sensation are intact Psych: euthymic mood, full affect   EKG:  EKG is ordered today.  SR 71  IWMI     Lipid Panel No results found for: CHOL, TRIG, HDL, CHOLHDL, VLDL, LDLCALC, LDLDIRECT    Wt Readings from Last 3 Encounters:  08/23/15 225 lb (102.059 kg)  07/13/14 224 lb (101.606 kg)  06/12/14 222 lb (100.699 kg)      ASSESSMENT AND PLAN:  1  CAD.  No symptoms to sugg angina    2  HL   Get lipids from Dr Drema Dallas' office    3  CV dz  F/u carotid USN in 1 year    4.  HTN  Better on current regimen   Follow    5  Tob  COunselled on cessation  Dicussed Chantix  He would like to try this     F/U in 1 year       Signed, [name], MD  08/23/2015 1:55 PM    [facility] [place], [place], [place]  [address] Phone: [phone]; Fax: [phone]

## 2015-08-27 ENCOUNTER — Encounter: Payer: Self-pay | Admitting: Internal Medicine

## 2015-09-02 ENCOUNTER — Other Ambulatory Visit: Payer: Self-pay | Admitting: *Deleted

## 2015-09-02 DIAGNOSIS — I6523 Occlusion and stenosis of bilateral carotid arteries: Secondary | ICD-10-CM

## 2015-09-03 ENCOUNTER — Other Ambulatory Visit (INDEPENDENT_AMBULATORY_CARE_PROVIDER_SITE_OTHER): Payer: Medicare Other | Admitting: *Deleted

## 2015-09-03 DIAGNOSIS — I6523 Occlusion and stenosis of bilateral carotid arteries: Secondary | ICD-10-CM

## 2015-09-03 DIAGNOSIS — I6529 Occlusion and stenosis of unspecified carotid artery: Secondary | ICD-10-CM | POA: Diagnosis not present

## 2015-09-03 LAB — LIPID PANEL
CHOL/HDL RATIO: 3.4 ratio (ref <=5.0)
CHOLESTEROL: 119 mg/dL — AB (ref 125–200)
HDL: 35 mg/dL — ABNORMAL LOW (ref >=40)
LDL Cholesterol: 65 mg/dL (ref <130)
Triglycerides: 97 mg/dL (ref <150)
VLDL: 19 mg/dL (ref <30)

## 2015-09-14 ENCOUNTER — Other Ambulatory Visit: Payer: Medicare Other

## 2015-10-20 ENCOUNTER — Encounter (HOSPITAL_COMMUNITY): Payer: Self-pay | Admitting: Family Medicine

## 2015-10-20 ENCOUNTER — Emergency Department (HOSPITAL_COMMUNITY): Payer: Medicare Other

## 2015-10-20 ENCOUNTER — Inpatient Hospital Stay (HOSPITAL_COMMUNITY)
Admission: EM | Admit: 2015-10-20 | Discharge: 2015-10-22 | DRG: 247 | Disposition: A | Discharge status: Home / Self Care | Payer: Medicare Other | Attending: Internal Medicine | Admitting: Internal Medicine

## 2015-10-20 DIAGNOSIS — R0602 Shortness of breath: Secondary | ICD-10-CM | POA: Diagnosis not present

## 2015-10-20 DIAGNOSIS — I214 Non-ST elevation (NSTEMI) myocardial infarction: Secondary | ICD-10-CM | POA: Diagnosis not present

## 2015-10-20 DIAGNOSIS — R200 Anesthesia of skin: Secondary | ICD-10-CM | POA: Diagnosis not present

## 2015-10-20 DIAGNOSIS — I252 Old myocardial infarction: Secondary | ICD-10-CM

## 2015-10-20 DIAGNOSIS — I119 Hypertensive heart disease without heart failure: Secondary | ICD-10-CM | POA: Diagnosis not present

## 2015-10-20 DIAGNOSIS — Z6831 Body mass index (BMI) 31.0-31.9, adult: Secondary | ICD-10-CM | POA: Diagnosis not present

## 2015-10-20 DIAGNOSIS — Z955 Presence of coronary angioplasty implant and graft: Secondary | ICD-10-CM

## 2015-10-20 DIAGNOSIS — I779 Disorder of arteries and arterioles, unspecified: Secondary | ICD-10-CM | POA: Insufficient documentation

## 2015-10-20 DIAGNOSIS — Z716 Tobacco abuse counseling: Secondary | ICD-10-CM | POA: Diagnosis not present

## 2015-10-20 DIAGNOSIS — Y718 Miscellaneous cardiovascular devices associated with adverse incidents, not elsewhere classified: Secondary | ICD-10-CM | POA: Diagnosis present

## 2015-10-20 DIAGNOSIS — F17210 Nicotine dependence, cigarettes, uncomplicated: Secondary | ICD-10-CM | POA: Diagnosis present

## 2015-10-20 DIAGNOSIS — I10 Essential (primary) hypertension: Secondary | ICD-10-CM | POA: Diagnosis not present

## 2015-10-20 DIAGNOSIS — I2510 Atherosclerotic heart disease of native coronary artery without angina pectoris: Secondary | ICD-10-CM | POA: Insufficient documentation

## 2015-10-20 DIAGNOSIS — E785 Hyperlipidemia, unspecified: Secondary | ICD-10-CM | POA: Diagnosis not present

## 2015-10-20 DIAGNOSIS — Z7982 Long term (current) use of aspirin: Secondary | ICD-10-CM | POA: Diagnosis not present

## 2015-10-20 DIAGNOSIS — T82858A Stenosis of vascular prosthetic devices, implants and grafts, initial encounter: Secondary | ICD-10-CM | POA: Diagnosis present

## 2015-10-20 DIAGNOSIS — Z720 Tobacco use: Secondary | ICD-10-CM | POA: Insufficient documentation

## 2015-10-20 DIAGNOSIS — R079 Chest pain, unspecified: Secondary | ICD-10-CM | POA: Diagnosis not present

## 2015-10-20 DIAGNOSIS — D72829 Elevated white blood cell count, unspecified: Secondary | ICD-10-CM

## 2015-10-20 DIAGNOSIS — I739 Peripheral vascular disease, unspecified: Secondary | ICD-10-CM

## 2015-10-20 HISTORY — DX: Tobacco use: Z72.0

## 2015-10-20 HISTORY — DX: Disorder of arteries and arterioles, unspecified: I77.9

## 2015-10-20 HISTORY — DX: Peripheral vascular disease, unspecified: I73.9

## 2015-10-20 HISTORY — DX: Morbid (severe) obesity due to excess calories: E66.01

## 2015-10-20 HISTORY — DX: Hypertensive heart disease without heart failure: I11.9

## 2015-10-20 LAB — CBC
HEMATOCRIT: 43.7 % (ref 39.0–52.0)
HEMOGLOBIN: 14.2 g/dL (ref 13.0–17.0)
MCH: 29.5 pg (ref 26.0–34.0)
MCHC: 32.5 g/dL (ref 30.0–36.0)
MCV: 90.7 fL (ref 78.0–100.0)
Platelets: 245 10*3/uL (ref 150–400)
RBC: 4.82 MIL/uL (ref 4.22–5.81)
RDW: 13.1 % (ref 11.5–15.5)
WBC: 13.7 10*3/uL — ABNORMAL HIGH (ref 4.0–10.5)

## 2015-10-20 LAB — BASIC METABOLIC PANEL
ANION GAP: 13 (ref 5–15)
BUN: 12 mg/dL (ref 6–20)
CO2: 23 mmol/L (ref 22–32)
Calcium: 9.3 mg/dL (ref 8.9–10.3)
Chloride: 99 mmol/L — ABNORMAL LOW (ref 101–111)
Creatinine, Ser: 0.95 mg/dL (ref 0.61–1.24)
GFR calc Af Amer: >60 mL/min (ref >60)
GFR calc non Af Amer: >60 mL/min (ref >60)
GLUCOSE: 153 mg/dL — AB (ref 65–99)
POTASSIUM: 3.5 mmol/L (ref 3.5–5.1)
Sodium: 135 mmol/L (ref 135–145)

## 2015-10-20 LAB — TSH: TSH: 2.23 u[IU]/mL (ref 0.350–4.500)

## 2015-10-20 LAB — I-STAT TROPONIN, ED: Troponin i, poc: 0.38 ng/mL (ref 0.00–0.08)

## 2015-10-20 LAB — TROPONIN I: TROPONIN I: 0.41 ng/mL — AB (ref <0.031)

## 2015-10-20 MED ORDER — SODIUM CHLORIDE 0.9% FLUSH: 3.0000 mL | INTRAVENOUS | Status: DC | PRN

## 2015-10-20 MED ORDER — ASPIRIN 81 MG PO CHEW
81.0000 mg | CHEWABLE_TABLET | ORAL | Status: AC
  Administered 2015-10-21: 81 mg via ORAL
  Filled 2015-10-20: qty 1

## 2015-10-20 MED ORDER — IRBESARTAN-HYDROCHLOROTHIAZIDE 150-12.5 MG PO TABS: 0.5000 | ORAL_TABLET | Freq: Every day | ORAL | Status: DC

## 2015-10-20 MED ORDER — SODIUM CHLORIDE 0.9 % WEIGHT BASED INFUSION: 1.0000 mL/kg/h | INTRAVENOUS | Status: DC

## 2015-10-20 MED ORDER — ACETAMINOPHEN 325 MG PO TABS: 650.0000 mg | ORAL_TABLET | ORAL | Status: DC | PRN

## 2015-10-20 MED ORDER — SODIUM CHLORIDE 0.9 % IV SOLN: 250.0000 mL | INTRAVENOUS | Status: DC | PRN

## 2015-10-20 MED ORDER — NICOTINE 14 MG/24HR TD PT24
14.0000 mg | MEDICATED_PATCH | Freq: Every day | TRANSDERMAL | Status: DC
  Administered 2015-10-21: 14 mg via TRANSDERMAL
  Filled 2015-10-20: qty 1

## 2015-10-20 MED ORDER — HYDROCHLOROTHIAZIDE 10 MG/ML ORAL SUSPENSION
6.2500 mg | Freq: Every day | ORAL | Status: DC
  Administered 2015-10-21 – 2015-10-22 (×2): 6.25 mg via ORAL
  Filled 2015-10-20 (×3): qty 1.25

## 2015-10-20 MED ORDER — ONDANSETRON HCL 4 MG/2ML IJ SOLN: 4.0000 mg | Freq: Four times a day (QID) | INTRAMUSCULAR | Status: DC | PRN

## 2015-10-20 MED ORDER — ASPIRIN 81 MG PO TABS: 81.0000 mg | ORAL_TABLET | Freq: Every day | ORAL | Status: DC

## 2015-10-20 MED ORDER — METOPROLOL SUCCINATE ER 25 MG PO TB24
12.5000 mg | ORAL_TABLET | Freq: Every day | ORAL | Status: DC
  Administered 2015-10-21 – 2015-10-22 (×2): 12.5 mg via ORAL
  Filled 2015-10-20 (×2): qty 1

## 2015-10-20 MED ORDER — NITROGLYCERIN 0.4 MG SL SUBL: 0.4000 mg | SUBLINGUAL_TABLET | SUBLINGUAL | Status: DC | PRN

## 2015-10-20 MED ORDER — HEPARIN (PORCINE) IN NACL 100-0.45 UNIT/ML-% IJ SOLN
1200.0000 [IU]/h | INTRAMUSCULAR | Status: DC
  Administered 2015-10-20: 1200 [IU]/h via INTRAVENOUS
  Filled 2015-10-20: qty 250

## 2015-10-20 MED ORDER — IRBESARTAN 75 MG PO TABS
75.0000 mg | ORAL_TABLET | Freq: Every day | ORAL | Status: DC
  Administered 2015-10-21 – 2015-10-22 (×2): 75 mg via ORAL
  Filled 2015-10-20 (×3): qty 1

## 2015-10-20 MED ORDER — ASPIRIN 81 MG PO CHEW
324.0000 mg | CHEWABLE_TABLET | Freq: Once | ORAL | Status: AC
  Administered 2015-10-20: 324 mg via ORAL
  Filled 2015-10-20: qty 4

## 2015-10-20 MED ORDER — SODIUM CHLORIDE 0.9 % WEIGHT BASED INFUSION
3.0000 mL/kg/h | INTRAVENOUS | Status: DC
  Administered 2015-10-21: 3 mL/kg/h via INTRAVENOUS

## 2015-10-20 MED ORDER — SODIUM CHLORIDE 0.9% FLUSH
3.0000 mL | Freq: Two times a day (BID) | INTRAVENOUS | Status: DC
  Administered 2015-10-21 – 2015-10-22 (×2): 3 mL via INTRAVENOUS

## 2015-10-20 MED ORDER — ASPIRIN 81 MG PO CHEW
81.0000 mg | CHEWABLE_TABLET | Freq: Every day | ORAL | Status: DC
  Administered 2015-10-22: 81 mg via ORAL
  Filled 2015-10-20: qty 1

## 2015-10-20 MED ORDER — ATORVASTATIN CALCIUM 80 MG PO TABS
80.0000 mg | ORAL_TABLET | Freq: Every day | ORAL | Status: DC
  Administered 2015-10-21 – 2015-10-22 (×2): 80 mg via ORAL
  Filled 2015-10-20 (×2): qty 1

## 2015-10-20 MED ORDER — SODIUM CHLORIDE 0.9% FLUSH
3.0000 mL | Freq: Two times a day (BID) | INTRAVENOUS | Status: DC
  Administered 2015-10-20: 3 mL via INTRAVENOUS

## 2015-10-20 MED ORDER — HEPARIN BOLUS VIA INFUSION
4000.0000 [IU] | Freq: Once | INTRAVENOUS | Status: AC
  Administered 2015-10-20: 4000 [IU] via INTRAVENOUS
  Filled 2015-10-20: qty 4000

## 2015-10-20 NOTE — Progress Notes (Signed)
ANTICOAGULATION CONSULT NOTE - Initial Consult  Pharmacy Consult for heparin Indication: chest pain/ACS  No Known Allergies  Patient Measurements: Weight: 225 lb (102.059 kg) Heparin Dosing Weight: 97.5 kg  Vital Signs: Temp: 97.8 F (36.6 C) (05/24 1720) Temp Source: Oral (05/24 1720) BP: 142/82 mmHg (05/24 1810) Pulse Rate: 68 (05/24 1810)  Labs:  Recent Labs  10/20/15 1730  HGB 14.2  HCT 43.7  PLT 245  CREATININE 0.95    Estimated Creatinine Clearance: 88.7 mL/min (by C-G formula based on Cr of 0.95).   Medical History: Past Medical History  Diagnosis Date  . Carotid artery occlusion   . Coronary artery disease   . Hypertension   . Hyperlipidemia   . MI, old     Medications:  Scheduled:  . aspirin  324 mg Oral Once    Assessment: 42 wo man admitted 10/20/2015 for CP with SOB and numbness in L neck and face. Trop 0.38 in ED. Received ASA 325.  PMH CAD s/p MI in [place] (2012), HTN, HLD  No anticoagulants prior to admission. Hgb 14.2, plt wnl.   Goal of Therapy:  Heparin level 0.3-0.7 units/ml Monitor platelets by anticoagulation protocol: Yes   Plan:  Heparin 4000 units x 1 bolus Heparin 1200 units/h AM HL and daily HL, CBC Monitor s/sx bleeding   Heloise Ochoa, Pharm.D., BCPS PGY2 Cardiology Pharmacy Resident Pager: [phone]  10/20/2015,6:20 PM

## 2015-10-20 NOTE — Consult Note (Deleted)
History & Physical    Patient ID: Randy [name] MRN: [id number], DOB/AGE: Oct 05, 1945   Admit date: 10/20/2015  Primary Physician: Gerrit Heck, MD Primary Cardiologist: Lizbeth Bark, MD   Patient Profile    70 y/o ? with a h/o CAD s/p prior MI and presumably RCA stenting who presented to the ED tonight following an episode of dyspnea and was found to have an elevated troponin.  Past Medical History    Past Medical History  Diagnosis Date  . Coronary artery disease     a. 2012 MI in [place] w/ PCI; b. 04/2013 MV: inflat scar, no ischemia.  . Hypertensive heart disease   . Hyperlipidemia   . Carotid arterial disease ([place])     a. S/p R CEA;  b. 05/2015 Carotid U/S: RICA 2-53%, LICA 66-44%.  . Morbid obesity ([place])   . Tobacco abuse     Past Surgical History  Procedure Laterality Date  . Cardiac catheterization    . Carotid endarterectomy    . Coronary angioplasty      Allergies  No Known Allergies  History of Present Illness    70 y/o ? with a h/o CAD s/p MI in [place] in 2012 @ which time he underwent stenting x 2.  He also has a h/o HTN, HL, obesity, and tobacco abuse.  He has been followed by Dr. Harrington Challenger as an outpt with low risk MV in 2014, nl EF by echo in 2014, nl ABI's in 09/2014, and moderate carotid dzs by u/s in 05/2015.  He lives locally with his wife and last travelled to [place] in March of this year.  He does not routinely exercise but generally does not experience any cp or dyspnea with usual activities.  Today, he and his wife went to [facility] and upon returning, he was carrying his good up the stairs into his home and developed profound dyspnea.  He sat and rested and felt that his heart was skipping, so he checked his carotid pulse and noted "skipped beats," which concerned him.  He was not having any chest pain or lightheadedness, and dyspnea resolved after sitting for about 45 seconds.  He was most concerned about his heart skipping and so he presented to the ED  where ECG showed RSR, prior inferior infarct and otw no acute changes.  His troponin returned elevated @ 0.38.  WBC 13.7.  Labs otw nl.  Since arrival in the ED, he has had no further dyspnea or palpitations.  He denies experiencing chest pain at all this evening, though apparently he reported this to triage earlier.  Home Medications    Prior to Admission medications   Medication Sig Start Date End Date Taking? Authorizing Provider  aspirin 81 MG tablet Take 81 mg by mouth daily.   Yes Historical Provider, MD  atorvastatin (LIPITOR) 40 MG tablet Take 40 mg by mouth daily. 09/01/15  Yes Historical Provider, MD  irbesartan-hydrochlorothiazide (AVALIDE) 150-12.5 MG tablet Take 0.5 tablets by mouth daily. 08/23/15  Yes Fay Records, MD  metoprolol succinate (TOPROL XL) 25 MG 24 hr tablet Take 0.5 tablets (12.5 mg total) by mouth daily. 08/23/15  Yes Fay Records, MD  varenicline (CHANTIX) 0.5 MG tablet Take 1 tablet (0.5 mg total) by mouth 2 (two) times daily. 08/23/15   Fay Records, MD    Family History    Family History  Problem Relation Age of Onset  . Hypertension Father     died in [place] @ 38  .  Other Mother     died @ 39 - complications following surgery.    Social History    Social History   Social History  . Marital Status: Married    Spouse Name: ardith  . Number of Children: 7  . Years of Education: college   Occupational History  . retired    Social History Main Topics  . Smoking status: Current Every Day Smoker -- 0.75 packs/day for 40 years    Types: Cigarettes    Start date: 06/18/1972  . Smokeless tobacco: Not on file  . Alcohol Use: No  . Drug Use: No  . Sexual Activity: Not on file   Other Topics Concern  . Not on file   Social History Narrative   Lives in [place] with wife.  Does not routinely exercise.  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     Review of Systems    General:  No chills, fever, night sweats or weight  changes.  Cardiovascular:  No chest pain, +++ dyspnea on exertion, no edema, orthopnea, palpitations, paroxysmal nocturnal dyspnea. Dermatological: No rash, lesions/masses Respiratory: No cough, dyspnea Urologic: No hematuria, dysuria Abdominal:   No nausea, vomiting, diarrhea, bright red blood per rectum, melena, or hematemesis Neurologic:  Perioral numbness in setting of dyspnea earlier today.  No visual changes, wkns, changes in mental status. All other systems reviewed and are otherwise negative except as noted above.  Physical Exam    Blood pressure 132/65, pulse 69, temperature 97.8 F (36.6 C), temperature source Oral, resp. rate 20, height 5' 11.5" (1.816 m), weight 225 lb (102.059 kg), SpO2 98 %.  General: Pleasant, NAD Psych: Normal affect. Neuro: Alert and oriented X 3. Moves all extremities spontaneously. HEENT: Normal  Neck: Supple without bruits or JVD. Lungs:  Resp regular and unlabored, CTA. Heart: RRR, distant heart sounds, no s3, s4, or murmurs. Abdomen: Soft, non-tender, non-distended, BS + x 4.  Extremities: No clubbing, cyanosis or edema. DP/PT/Radials 2+ and equal bilaterally.  Labs    Troponin [facility] of Care Test)  Recent Labs  11-09-2015 1735  TROPIPOC 0.38*   Lab Results  Component Value Date   WBC 13.7* 09-Nov-2015   HGB 14.2 11/09/2015   HCT 43.7 11-09-15   MCV 90.7 11-09-2015   PLT 245 11/09/2015     Recent Labs Lab 11/09/15 1730  NA 135  K 3.5  CL 99*  CO2 23  BUN 12  CREATININE 0.95  CALCIUM 9.3  GLUCOSE 153*   Lab Results  Component Value Date   CHOL 119* 09/03/2015   HDL 35* 09/03/2015   LDLCALC 65 09/03/2015   TRIG 97 09/03/2015    Radiology Studies    Dg Chest Port 1 View  09-Nov-2015  CLINICAL DATA:  Numbness in the left side the lips. EXAM: PORTABLE CHEST 1 VIEW COMPARISON:  None. FINDINGS: The heart size and mediastinal contours are within normal limits. There is no focal infiltrate, pulmonary edema, or pleural  effusion. The visualized skeletal structures are unremarkable. IMPRESSION: No active cardiopulmonary disease. Electronically Signed   By: Abelardo Diesel M.D.   On: 2015-11-09 18:41    ECG & Cardiac Imaging    RSR, 73, inf infarct, non-specific st/t changes.  Assessment & Plan    1.  NSTEMI:  Pt with a h/o CAD s/p prior MI and stenting x 2 in [place] in 2012.  Neg nuc study in 2014 with nl EF by echo @ that time.  He presented to the ED tonight after an episode of profound dyspnea and palpitations after carrying things into his house from [facility].  He denies chest pain. In ED, ECG w/o acute changes however troponin is elevated @ 0.38.  Currently symptom free.  Heparin started by ER.  Cont heparin, asa,  blocker, statin.  Cycle CE.  Probable cath in AM.  2.  Hypertensive Heart Disease:  Stable on  blocker, ARB/diuretic.  3.  HL:  LDL 65 in April.  Cont statin.  4.  Carotid Arterial dzs:  Stable by U/S in January.  Cont asa/statin.  5.  Tobacco abuse:  Cessation advised.  6.  Morbid obesity:  Will benefit from outpt dietary consultation.  7.  Leukocytosis:  ? Reactive in setting of NSTEMI.  CXR w/o acute findings.  No localizing Ss.  Afebrile.  Follow.  Signed, Murray Hodgkins, NP 10/20/2015, 7:51 PM

## 2015-10-20 NOTE — ED Provider Notes (Signed)
CSN: [id number]     Arrival date & time 10/20/15  1712 History   First MD Initiated Contact with Patient 10/20/15 1804     Chief Complaint  Patient presents with  . Shortness of Breath  . Chest Pain     (Consider location/radiation/quality/duration/timing/severity/associated sxs/prior Treatment) HPI Comments: 70yo M w/ PMH including CAD s/p stenting, carotid endarterectomy, HTN, HLD who presents with shortness of breath and numbness. Earlier today, the patient went to [facility] and then came home, where he carried several heavy objects up the stairs. When he finished, he noted that he did not feel well and he sat down. He felt short of breath and noticed numbness in his left neck and face. He also noticed that his heart seemed to skip beats and beat irregularly. Although he endorses chest pain to triage, he denied any chest pain or pressure to me. He states that his symptoms lasted approximately one hour and eventually resolved. He denies any symptoms currently. He states that he has had chest pain previously but denies any chest pain today. He states he had an MI and required cardiac stenting approximately 6 years ago while he was in [place]. He denies any recent illness. No problems with leg swelling. No recent travel.  Patient is a 70 y.o. male presenting with shortness of breath and chest pain. The history is provided by the patient.  Shortness of Breath Associated symptoms: chest pain   Chest Pain Associated symptoms: shortness of breath     Past Medical History  Diagnosis Date  . Carotid artery occlusion   . Coronary artery disease   . Hypertension   . Hyperlipidemia   . MI, old    Past Surgical History  Procedure Laterality Date  . Cardiac catheterization    . Carotid endarterectomy    . Coronary angioplasty     Family History  Problem Relation Age of Onset  . Hypertension Father    Social History  Substance Use Topics  . Smoking status: Current Every Day Smoker -- 0.50  packs/day for 40 years    Types: Cigarettes    Start date: 06/18/1972  . Smokeless tobacco: None  . Alcohol Use: No    Review of Systems  Respiratory: Positive for shortness of breath.   Cardiovascular: Positive for chest pain.   10 Systems reviewed and are negative for acute change except as noted in the HPI.   Allergies  Review of patient's allergies indicates no known allergies.  Home Medications   Prior to Admission medications   Medication Sig Start Date End Date Taking? Authorizing Provider  aspirin 81 MG tablet Take 81 mg by mouth daily.   Yes Historical Provider, MD  atorvastatin (LIPITOR) 40 MG tablet Take 40 mg by mouth daily. 09/01/15  Yes Historical Provider, MD  irbesartan-hydrochlorothiazide (AVALIDE) 150-12.5 MG tablet Take 0.5 tablets by mouth daily. 08/23/15  Yes Fay Records, MD  metoprolol succinate (TOPROL XL) 25 MG 24 hr tablet Take 0.5 tablets (12.5 mg total) by mouth daily. 08/23/15  Yes Fay Records, MD  varenicline (CHANTIX) 0.5 MG tablet Take 1 tablet (0.5 mg total) by mouth 2 (two) times daily. 08/23/15   Fay Records, MD   BP 134/71 mmHg  Pulse 67  Temp(Src) 97.8 F (36.6 C) (Oral)  Resp 20  Ht 5' 11.5" (1.816 m)  Wt 225 lb (102.059 kg)  BMI 30.95 kg/m2  SpO2 99% Physical Exam  Constitutional: He is oriented to person, place, and time. He appears  well-developed and well-nourished. No distress.  HENT:  Head: Normocephalic and atraumatic.  Moist mucous membranes, scar over R mid-face and weakness of R facial muscles involving forehead and eye  Eyes: Conjunctivae are normal. Pupils are equal, round, and reactive to light.  Neck: Neck supple.  Cardiovascular: Normal rate, regular rhythm and normal heart sounds.   No murmur heard. Pulmonary/Chest: Effort normal and breath sounds normal.  Abdominal: Soft. Bowel sounds are normal. He exhibits no distension. There is no tenderness.  Multiple well-healed abdominal scars  Musculoskeletal: He exhibits no  edema.  Neurological: He is alert and oriented to person, place, and time.  Fluent speech  Skin: Skin is warm and dry.  Psychiatric: He has a normal mood and affect. Judgment normal.  Nursing note and vitals reviewed.   ED Course  .Critical Care Performed by: Sharlett Iles Authorized by: Sharlett Iles Total critical care time: 30 minutes Critical care time was exclusive of separately billable procedures and treating other patients. Critical care was necessary to treat or prevent imminent or life-threatening deterioration of the following conditions: cardiac failure. Critical care was time spent personally by me on the following activities: development of treatment plan with patient or surrogate, discussions with consultants, evaluation of patient's response to treatment, examination of patient, obtaining history from patient or surrogate, ordering and performing treatments and interventions, ordering and review of laboratory studies, ordering and review of radiographic studies, re-evaluation of patient's condition and review of old charts.   (including critical care time) Labs Review Labs Reviewed  BASIC METABOLIC PANEL - Abnormal; Notable for the following:    Chloride 99 (*)    Glucose, Bld 153 (*)    All other components within normal limits  CBC - Abnormal; Notable for the following:    WBC 13.7 (*)    All other components within normal limits  I-STAT TROPOININ, ED - Abnormal; Notable for the following:    Troponin i, poc 0.38 (*)    All other components within normal limits  HEPARIN LEVEL (UNFRACTIONATED)  CBC    Imaging Review Dg Chest Port 1 View  10/20/2015  CLINICAL DATA:  Numbness in the left side the lips. EXAM: PORTABLE CHEST 1 VIEW COMPARISON:  None. FINDINGS: The heart size and mediastinal contours are within normal limits. There is no focal infiltrate, pulmonary edema, or pleural effusion. The visualized skeletal structures are unremarkable.  IMPRESSION: No active cardiopulmonary disease. Electronically Signed   By: Abelardo Diesel M.D.   On: 10/20/2015 18:41   I have personally reviewed and evaluated these lab results as part of my medical decision-making.   EKG Interpretation   Date/Time:  Wednesday Oct 20 2015 18:09:29 EDT Ventricular Rate:  76 PR Interval:  182 QRS Duration: 100 QT Interval:  429 QTC Calculation: 482 R Axis:   -23 Text Interpretation:  Sinus rhythm Multiform ventricular premature  complexes Borderline left axis deviation Abnormal R-wave progression,  early transition Nonspecific T abnormalities, diffuse leads Borderline  prolonged QT interval occasional PVCs new from previous, otherwise no  significant change Confirmed by LITTLE MD, RACHEL [id number]) on 10/20/2015  6:17:05 PM     Medications  heparin ADULT infusion 100 units/mL (25000 units/247m sodium chloride 0.45%) (1,200 Units/hr Intravenous New Bag/Given 10/20/15 1840)  aspirin chewable tablet 324 mg (324 mg Oral Given 10/20/15 1835)  heparin bolus via infusion 4,000 Units (4,000 Units Intravenous Given 10/20/15 1839)    MDM   Final diagnoses:  None   Patient presents with an episode  of shortness of breath as well as left facial numbness which occurred after exertion and improved after approximately one hour. On exam, he was comfortable and well-appearing with reassuring vital signs. EKG with some nonspecific T-wave changes and borderline prolonged QT, no previous available for comparison. No obvious ischemic changes. Occasional PVCs. Troponin and obtained from triage was abnormal at 0.38. Gave the patient 324 mg aspirin. He denies any bleeding problems or bloody stool, therefore initiated heparin per ACS protocol. Chest x-ray is unremarkable and given no sudden ripping or tearing chest pain, I doubt aortic dissection. Also no risk factors for PE. Patient remains symptom free in the ED. I discussed with cardiology, Dr. Tommi Rumps, who will admit patient  for further care.   Sharlett Iles, MD 10/20/15 Lurline Hare

## 2015-10-20 NOTE — ED Notes (Signed)
Dr. Little at bedside.  

## 2015-10-20 NOTE — ED Notes (Signed)
Pt here for chest pain, SOB and numbness in left neck and face. sts he was at [employer] when this happened and he was checking his pulse and he said his heart would stop for a few seconds.

## 2015-10-20 NOTE — ED Notes (Signed)
Cardiology at bedside with patient

## 2015-10-20 NOTE — ED Notes (Signed)
Reported to Dr. Rex Kras that pt.s Troponin is .38 and that we are placing pt. In Pod A 8

## 2015-10-20 NOTE — H&P (Signed)
History & Physical    Patient ID: Randy [name] MRN: [id number], DOB/AGE: December 15, 1945   Admit date: 10/20/2015  Primary Physician: Randy Heck, MD Primary Cardiologist: Randy Bark, MD   Patient Profile    70 y/o ? with a h/o CAD s/p prior MI and presumably RCA stenting who presented to the ED tonight following an episode of dyspnea and was found to have an elevated troponin.  Past Medical History    Past Medical History  Diagnosis Date  . Coronary artery disease     a. 2012 MI in [place] w/ PCI; b. 04/2013 MV: inflat scar, no ischemia.  . Hypertensive heart disease   . Hyperlipidemia   . Carotid arterial disease ([place])     a. S/p R CEA;  b. 05/2015 Carotid U/S: RICA 7-40%, LICA 81-44%.  . Morbid obesity ([place])   . Tobacco abuse     Past Surgical History  Procedure Laterality Date  . Cardiac catheterization    . Carotid endarterectomy    . Coronary angioplasty      Allergies  No Known Allergies  History of Present Illness    70 y/o ? with a h/o CAD s/p MI in [place] in 2012 @ which time he underwent stenting x 2.  He also has a h/o HTN, HL, obesity, and tobacco abuse.  He has been followed by Dr. Harrington [name] as an outpt with low risk MV in 2014, nl EF by echo in 2014, nl ABI's in 09/2014, and moderate carotid dzs by u/s in 05/2015.  He lives locally with his wife and last travelled to [place] in March of this year.  He does not routinely exercise but generally does not experience any cp or dyspnea with usual activities.  Today, he and his wife went to [facility] and upon returning, he was carrying his good up the stairs into his home and developed profound dyspnea.  He sat and rested and felt that his heart was skipping, so he checked his carotid pulse and noted "skipped beats," which concerned him.  He was not having any chest pain or lightheadedness, and dyspnea resolved after sitting for about 45 seconds.  He was most concerned about his heart skipping and so he presented to the ED  where ECG showed RSR, prior inferior infarct and otw no acute changes.  His troponin returned elevated @ 0.38.  WBC 13.7.  Labs otw nl.  Since arrival in the ED, he has had no further dyspnea or palpitations.  He denies experiencing chest pain at all this evening, though apparently he reported this to triage earlier.  Home Medications    Prior to Admission medications   Medication Sig Start Date End Date Taking? Authorizing Provider  aspirin 81 MG tablet Take 81 mg by mouth daily.   Yes Historical Provider, MD  atorvastatin (LIPITOR) 40 MG tablet Take 40 mg by mouth daily. 09/01/15  Yes Historical Provider, MD  irbesartan-hydrochlorothiazide (AVALIDE) 150-12.5 MG tablet Take 0.5 tablets by mouth daily. 08/23/15  Yes Randy Records, MD  metoprolol succinate (TOPROL XL) 25 MG 24 hr tablet Take 0.5 tablets (12.5 mg total) by mouth daily. 08/23/15  Yes Randy Records, MD  varenicline (CHANTIX) 0.5 MG tablet Take 1 tablet (0.5 mg total) by mouth 2 (two) times daily. 08/23/15   Randy Records, MD    Family History    Family History  Problem Relation Age of Onset  . Hypertension Father     died in [place] @ 13  .  Other Mother     died @ 7 - complications following surgery.    Social History    Social History   Social History  . Marital Status: Married    Spouse Name: Randy [name]  . Number of Children: 7  . Years of Education: college   Occupational History  . retired    Social History Main Topics  . Smoking status: Current Every Day Smoker -- 0.75 packs/day for 40 years    Types: Cigarettes    Start date: 06/18/1972  . Smokeless tobacco: Not on file  . Alcohol Use: No  . Drug Use: No  . Sexual Activity: Not on file   Other Topics Concern  . Not on file   Social History Narrative   Lives in [place] with wife.  Does not routinely exercise.  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     Review of Systems    General:  No chills, fever, night sweats or weight  changes.  Cardiovascular:  No chest pain, +++ dyspnea on exertion, no edema, orthopnea, palpitations, paroxysmal nocturnal dyspnea. Dermatological: No rash, lesions/masses Respiratory: No cough, dyspnea Urologic: No hematuria, dysuria Abdominal:   No nausea, vomiting, diarrhea, bright red blood per rectum, melena, or hematemesis Neurologic:  Perioral numbness in setting of dyspnea earlier today.  No visual changes, wkns, changes in mental status. All other systems reviewed and are otherwise negative except as noted above.  Physical Exam    Blood pressure 132/65, pulse 69, temperature 97.8 F (36.6 C), temperature source Oral, resp. rate 20, height 5' 11.5" (1.816 m), weight 225 lb (102.059 kg), SpO2 98 %.  General: Pleasant, NAD Psych: Normal affect. Neuro: Alert and oriented X 3. Moves all extremities spontaneously. HEENT: Normal  Neck: Supple without bruits or JVD. Lungs:  Resp regular and unlabored, CTA. Heart: RRR, distant heart sounds, no s3, s4, or murmurs. Abdomen: Soft, non-tender, non-distended, BS + x 4.  Extremities: No clubbing, cyanosis or edema. DP/PT/Radials 2+ and equal bilaterally.  Labs    Troponin [facility] of Care Test)  Recent Labs  11-09-2015 1735  TROPIPOC 0.38*   Lab Results  Component Value Date   WBC 13.7* 09-Nov-2015   HGB 14.2 2015/11/09   HCT 43.7 11-09-15   MCV 90.7 2015-11-09   PLT 245 2015/11/09     Recent Labs Lab November 09, 2015 1730  NA 135  K 3.5  CL 99*  CO2 23  BUN 12  CREATININE 0.95  CALCIUM 9.3  GLUCOSE 153*   Lab Results  Component Value Date   CHOL 119* 09/03/2015   HDL 35* 09/03/2015   LDLCALC 65 09/03/2015   TRIG 97 09/03/2015    Radiology Studies    Dg Chest Port 1 View  Nov 09, 2015  CLINICAL DATA:  Numbness in the left side the lips. EXAM: PORTABLE CHEST 1 VIEW COMPARISON:  None. FINDINGS: The heart size and mediastinal contours are within normal limits. There is no focal infiltrate, pulmonary edema, or pleural  effusion. The visualized skeletal structures are unremarkable. IMPRESSION: No active cardiopulmonary disease. Electronically Signed   By: Abelardo Diesel M.D.   On: 2015-11-09 18:41    ECG & Cardiac Imaging    RSR, 73, inf infarct, non-specific st/t changes.  Assessment & Plan    1.  NSTEMI:  Pt with a h/o CAD s/p prior MI and stenting x 2 in [place] in 2012.  Neg nuc study in 2014 with nl EF by echo @ that time.  He presented to the ED tonight after an episode of profound dyspnea and palpitations after carrying things into his house from [facility].  He denies chest pain. In ED, ECG w/o acute changes however troponin is elevated @ 0.38.  Currently symptom free.  Heparin started by ER.  Cont heparin, asa,  blocker, statin.  Cycle CE.  Probable cath in AM.  2.  Hypertensive Heart Disease:  Stable on  blocker, ARB/diuretic.  3.  HL:  LDL 65 in April.  Cont statin.  4.  Carotid Arterial dzs:  Stable by U/S in January.  Cont asa/statin.  5.  Tobacco abuse:  Cessation advised.  6.  Morbid obesity:  Will benefit from outpt dietary consultation.  7.  Leukocytosis:  ? Reactive in setting of NSTEMI.  CXR w/o acute findings.  No localizing Ss.  Afebrile.  Follow.  Signed, Murray Hodgkins, NP 10/20/2015, 7:51 PM  The patient was seen and examined, and I agree with the history, physical exam, assessment and plan as documented above which has been discussed with Angelica Ran NP. Patient currently symptom free. Denies any chest pain in the past several months. Had one isolated episode of shortness of breath while chopping wood roughly 3 months ago. Did experience bouts of dizziness when going from sitting to standing position this week but denies syncope. Will continue medications as noted above and plan for coronary angiography 10/21/15.  Kate Sable, MD, [facility]  10/20/2015 8:16 PM

## 2015-10-21 ENCOUNTER — Encounter (HOSPITAL_COMMUNITY)
Admission: EM | Disposition: A | Discharge status: Home / Self Care | Payer: Self-pay | Source: Home / Self Care | Attending: Internal Medicine

## 2015-10-21 ENCOUNTER — Encounter (HOSPITAL_COMMUNITY): Payer: Self-pay | Admitting: Interventional Cardiology

## 2015-10-21 DIAGNOSIS — Z9889 Other specified postprocedural states: Secondary | ICD-10-CM

## 2015-10-21 HISTORY — PX: CARDIAC CATHETERIZATION: SHX172

## 2015-10-21 HISTORY — DX: Other specified postprocedural states: Z98.890

## 2015-10-21 LAB — CREATININE, SERUM: CREATININE: 0.81 mg/dL (ref 0.61–1.24)

## 2015-10-21 LAB — COMPREHENSIVE METABOLIC PANEL
ALT: 22 U/L (ref 17–63)
AST: 19 U/L (ref 15–41)
Albumin: 3.1 g/dL — ABNORMAL LOW (ref 3.5–5.0)
Alkaline Phosphatase: 62 U/L (ref 38–126)
Anion gap: 6 (ref 5–15)
BILIRUBIN TOTAL: 0.4 mg/dL (ref 0.3–1.2)
BUN: 12 mg/dL (ref 6–20)
CHLORIDE: 102 mmol/L (ref 101–111)
CO2: 26 mmol/L (ref 22–32)
CREATININE: 0.86 mg/dL (ref 0.61–1.24)
Calcium: 8.8 mg/dL — ABNORMAL LOW (ref 8.9–10.3)
Glucose, Bld: 163 mg/dL — ABNORMAL HIGH (ref 65–99)
POTASSIUM: 3.5 mmol/L (ref 3.5–5.1)
Sodium: 134 mmol/L — ABNORMAL LOW (ref 135–145)
TOTAL PROTEIN: 6.3 g/dL — AB (ref 6.5–8.1)

## 2015-10-21 LAB — CBC
HCT: 39.9 % (ref 39.0–52.0)
HEMATOCRIT: 42.2 % (ref 39.0–52.0)
HEMOGLOBIN: 13.5 g/dL (ref 13.0–17.0)
Hemoglobin: 12.9 g/dL — ABNORMAL LOW (ref 13.0–17.0)
MCH: 29.1 pg (ref 26.0–34.0)
MCH: 29.9 pg (ref 26.0–34.0)
MCHC: 32.3 g/dL (ref 30.0–36.0)
MCHC: 32 g/dL (ref 30.0–36.0)
MCV: 89.9 fL (ref 78.0–100.0)
MCV: 93.6 fL (ref 78.0–100.0)
PLATELETS: 205 10*3/uL (ref 150–400)
Platelets: 204 10*3/uL (ref 150–400)
RBC: 4.44 MIL/uL (ref 4.22–5.81)
RBC: 4.51 MIL/uL (ref 4.22–5.81)
RDW: 13.2 % (ref 11.5–15.5)
RDW: 13.3 % (ref 11.5–15.5)
WBC: 11.5 10*3/uL — AB (ref 4.0–10.5)
WBC: 9.8 10*3/uL (ref 4.0–10.5)

## 2015-10-21 LAB — PROTIME-INR
INR: 1.14 (ref 0.00–1.49)
PROTHROMBIN TIME: 14.7 s (ref 11.6–15.2)

## 2015-10-21 LAB — POCT ACTIVATED CLOTTING TIME
Activated Clotting Time: 266 seconds
Activated Clotting Time: 384 seconds

## 2015-10-21 LAB — HEPARIN LEVEL (UNFRACTIONATED)
HEPARIN UNFRACTIONATED: 0.44 [IU]/mL (ref 0.30–0.70)
Heparin Unfractionated: 0.32 IU/mL (ref 0.30–0.70)

## 2015-10-21 LAB — TROPONIN I
TROPONIN I: 0.38 ng/mL — AB (ref <0.031)
TROPONIN I: 0.32 ng/mL — AB (ref <0.031)

## 2015-10-21 LAB — PLATELET COUNT: PLATELETS: 218 10*3/uL (ref 150–400)

## 2015-10-21 LAB — MRSA PCR SCREENING: MRSA by PCR: NEGATIVE

## 2015-10-21 SURGERY — LEFT HEART CATH AND CORONARY ANGIOGRAPHY
Anesthesia: LOCAL

## 2015-10-21 MED ORDER — LIDOCAINE HCL (PF) 1 % IJ SOLN
INTRAMUSCULAR | Status: AC
  Filled 2015-10-21: qty 30

## 2015-10-21 MED ORDER — HEPARIN (PORCINE) IN NACL 2-0.9 UNIT/ML-% IJ SOLN
INTRAMUSCULAR | Status: DC | PRN
  Administered 2015-10-21: 15:00:00

## 2015-10-21 MED ORDER — FENTANYL CITRATE (PF) 100 MCG/2ML IJ SOLN
INTRAMUSCULAR | Status: AC
  Filled 2015-10-21: qty 2

## 2015-10-21 MED ORDER — HEPARIN SODIUM (PORCINE) 1000 UNIT/ML IJ SOLN
INTRAMUSCULAR | Status: AC
  Filled 2015-10-21: qty 1

## 2015-10-21 MED ORDER — FENTANYL CITRATE (PF) 100 MCG/2ML IJ SOLN
INTRAMUSCULAR | Status: DC | PRN
  Administered 2015-10-21 (×2): 25 ug via INTRAVENOUS

## 2015-10-21 MED ORDER — MIDAZOLAM HCL 2 MG/2ML IJ SOLN
INTRAMUSCULAR | Status: AC
  Filled 2015-10-21: qty 2

## 2015-10-21 MED ORDER — HEPARIN (PORCINE) IN NACL 2-0.9 UNIT/ML-% IJ SOLN
INTRAMUSCULAR | Status: AC
  Filled 2015-10-21: qty 1000

## 2015-10-21 MED ORDER — IOPAMIDOL (ISOVUE-370) INJECTION 76%
INTRAVENOUS | Status: AC
  Filled 2015-10-21: qty 100

## 2015-10-21 MED ORDER — TIROFIBAN HCL IN NACL 5-0.9 MG/100ML-% IV SOLN
INTRAVENOUS | Status: AC
  Filled 2015-10-21: qty 100

## 2015-10-21 MED ORDER — HEPARIN SODIUM (PORCINE) 1000 UNIT/ML IJ SOLN
INTRAMUSCULAR | Status: DC | PRN
  Administered 2015-10-21: 5000 [IU] via INTRAVENOUS
  Administered 2015-10-21: 2000 [IU] via INTRAVENOUS
  Administered 2015-10-21: 5000 [IU] via INTRAVENOUS

## 2015-10-21 MED ORDER — TICAGRELOR 90 MG PO TABS
90.0000 mg | ORAL_TABLET | Freq: Two times a day (BID) | ORAL | Status: DC
  Administered 2015-10-21 – 2015-10-22 (×2): 90 mg via ORAL
  Filled 2015-10-21 (×2): qty 1

## 2015-10-21 MED ORDER — TIROFIBAN HCL IN NACL 5-0.9 MG/100ML-% IV SOLN
0.1500 ug/kg/min | INTRAVENOUS | Status: AC
  Filled 2015-10-21: qty 100

## 2015-10-21 MED ORDER — IOPAMIDOL (ISOVUE-370) INJECTION 76%
INTRAVENOUS | Status: DC | PRN
  Administered 2015-10-21: 100 mL

## 2015-10-21 MED ORDER — ASPIRIN 81 MG PO CHEW: 81.0000 mg | CHEWABLE_TABLET | Freq: Every day | ORAL | Status: DC

## 2015-10-21 MED ORDER — SODIUM CHLORIDE 0.9% FLUSH
3.0000 mL | Freq: Two times a day (BID) | INTRAVENOUS | Status: DC
  Administered 2015-10-21 – 2015-10-22 (×2): 3 mL via INTRAVENOUS

## 2015-10-21 MED ORDER — TIROFIBAN (AGGRASTAT) BOLUS VIA INFUSION
INTRAVENOUS | Status: DC | PRN
  Administered 2015-10-21: 2542.5 ug via INTRAVENOUS

## 2015-10-21 MED ORDER — ACETAMINOPHEN 325 MG PO TABS: 650.0000 mg | ORAL_TABLET | ORAL | Status: DC | PRN

## 2015-10-21 MED ORDER — ONDANSETRON HCL 4 MG/2ML IJ SOLN: 4.0000 mg | Freq: Four times a day (QID) | INTRAMUSCULAR | Status: DC | PRN

## 2015-10-21 MED ORDER — HEPARIN (PORCINE) IN NACL 2-0.9 UNIT/ML-% IJ SOLN
INTRAMUSCULAR | Status: DC | PRN
  Administered 2015-10-21: 10 mL via INTRA_ARTERIAL

## 2015-10-21 MED ORDER — SODIUM CHLORIDE 0.9 % IV SOLN: 250.0000 mL | INTRAVENOUS | Status: DC | PRN

## 2015-10-21 MED ORDER — NITROGLYCERIN 1 MG/10 ML FOR IR/CATH LAB
INTRA_ARTERIAL | Status: DC | PRN
  Administered 2015-10-21: 200 ug via INTRACORONARY

## 2015-10-21 MED ORDER — HEPARIN (PORCINE) IN NACL 2-0.9 UNIT/ML-% IJ SOLN
INTRAMUSCULAR | Status: AC
  Filled 2015-10-21: qty 500

## 2015-10-21 MED ORDER — HEPARIN SODIUM (PORCINE) 5000 UNIT/ML IJ SOLN
5000.0000 [IU] | Freq: Three times a day (TID) | INTRAMUSCULAR | Status: DC
  Administered 2015-10-22: 5000 [IU] via SUBCUTANEOUS
  Filled 2015-10-21: qty 1

## 2015-10-21 MED ORDER — SODIUM CHLORIDE 0.9% FLUSH: 3.0000 mL | INTRAVENOUS | Status: DC | PRN

## 2015-10-21 MED ORDER — SODIUM CHLORIDE 0.9 % WEIGHT BASED INFUSION
1.0000 mL/kg/h | INTRAVENOUS | Status: AC
  Administered 2015-10-21 (×2): 1 mL/kg/h via INTRAVENOUS

## 2015-10-21 MED ORDER — LIDOCAINE HCL (PF) 1 % IJ SOLN
INTRAMUSCULAR | Status: DC | PRN
  Administered 2015-10-21: 4 mL

## 2015-10-21 MED ORDER — NITROGLYCERIN 1 MG/10 ML FOR IR/CATH LAB
INTRA_ARTERIAL | Status: AC
  Filled 2015-10-21: qty 10

## 2015-10-21 MED ORDER — TIROFIBAN HCL IV 12.5 MG/250 ML
INTRAVENOUS | Status: DC | PRN
  Administered 2015-10-21: .15 ug/kg/min via INTRAVENOUS

## 2015-10-21 MED ORDER — TICAGRELOR 90 MG PO TABS
ORAL_TABLET | ORAL | Status: DC | PRN
  Administered 2015-10-21: 180 mg via ORAL

## 2015-10-21 MED ORDER — MIDAZOLAM HCL 2 MG/2ML IJ SOLN
INTRAMUSCULAR | Status: DC | PRN
  Administered 2015-10-21: 2 mg via INTRAVENOUS
  Administered 2015-10-21: 1 mg via INTRAVENOUS

## 2015-10-21 MED ORDER — TICAGRELOR 90 MG PO TABS
ORAL_TABLET | ORAL | Status: AC
  Filled 2015-10-21: qty 1

## 2015-10-21 MED ORDER — VERAPAMIL HCL 2.5 MG/ML IV SOLN
INTRAVENOUS | Status: AC
  Filled 2015-10-21: qty 2

## 2015-10-21 SURGICAL SUPPLY — 22 items
BALLN ANGIOSCULPT RX 2.5X10 (BALLOONS) ×2
BALLN EUPHORA RX 3.5X15 (BALLOONS) ×2
BALLN ~~LOC~~ TREK RX 4.5X12 (BALLOONS) ×2
BALLOON ANGIOSCULPT RX 2.5X10 (BALLOONS) ×1 IMPLANT
BALLOON EUPHORA RX 3.5X15 (BALLOONS) ×1 IMPLANT
BALLOON ~~LOC~~ TREK RX 4.5X12 (BALLOONS) ×1 IMPLANT
CATH INFINITI 5 FR JL3.5 (CATHETERS) ×2 IMPLANT
CATH INFINITI 5FR ANG PIGTAIL (CATHETERS) ×2 IMPLANT
CATH INFINITI JR4 5F (CATHETERS) ×2 IMPLANT
DEVICE RAD COMP TR BAND LRG (VASCULAR PRODUCTS) ×2 IMPLANT
GLIDESHEATH SLEND SS 6F .021 (SHEATH) ×2 IMPLANT
GUIDE CATH RUNWAY 6FR CLS3 (CATHETERS) ×2 IMPLANT
KIT ENCORE 26 ADVANTAGE (KITS) ×2 IMPLANT
KIT HEART LEFT (KITS) ×2 IMPLANT
PACK CARDIAC CATHETERIZATION (CUSTOM PROCEDURE TRAY) ×2 IMPLANT
STENT PROMUS PREM MR 4.0X20 (Permanent Stent) ×2 IMPLANT
SYR MEDRAD MARK V 150ML (SYRINGE) IMPLANT
TRANSDUCER W/STOPCOCK (MISCELLANEOUS) ×2 IMPLANT
TUBING CIL FLEX 10 FLL-RA (TUBING) ×2 IMPLANT
VALVE GUARDIAN II ~~LOC~~ HEMO (MISCELLANEOUS) ×2 IMPLANT
WIRE ASAHI PROWATER 180CM (WIRE) ×2 IMPLANT
WIRE SAFE-T 1.5MM-J .035X260CM (WIRE) ×2 IMPLANT

## 2015-10-21 NOTE — Care Management Note (Signed)
Case Management Note  Patient Details  Name: Randy [name] MRN: [id number] Date of Birth: [birth year]  Subjective/Objective:      Adm w nstemi              Action/Plan:lives w wife   Expected Discharge Date:                  Expected Discharge Plan:  Home/Self Care  In-House Referral:     Discharge planning Services     Post Acute Care Choice:    Choice offered to:     DME Arranged:    DME Agency:     HH Arranged:    HH Agency:     Status of Service:  In process, will continue to follow  Medicare Important Message Given:    Date Medicare IM Given:    Medicare IM give by:    Date Additional Medicare IM Given:    Additional Medicare Important Message give by:     If discussed at [place] of Stay Meetings, dates discussed:    Additional Comments: ur review done  Lacretia Leigh, RN 10/21/2015, 9:36 AM

## 2015-10-21 NOTE — Progress Notes (Signed)
ANTICOAGULATION CONSULT NOTE  Pharmacy Consult for heparin Indication: chest pain/ACS  No Known Allergies  Patient Measurements: Height: 5' 11.5" (181.6 cm) Weight: 225 lb (102.059 kg) IBW/kg (Calculated) : 76.45 Heparin Dosing Weight: 97.5 kg  Vital Signs: Temp: 98.1 F (36.7 C) (05/24 2340) Temp Source: Oral (05/24 2340) BP: 111/62 mmHg (05/24 2340) Pulse Rate: 63 (05/24 2340)  Labs:  Recent Labs  10/20/15 1730 10/20/15 2043 10/20/15 2357  HGB 14.2  --   --   HCT 43.7  --   --   PLT 245  --   --   HEPARINUNFRC  --   --  0.44  CREATININE 0.95  --   --   TROPONINI  --  0.41*  --     Estimated Creatinine Clearance: 88.7 mL/min (by C-G formula based on Cr of 0.95).   Medical History: Past Medical History  Diagnosis Date  . Coronary artery disease     a. 2012 MI in [place] w/ PCI; b. 04/2013 MV: inflat scar, no ischemia.  . Hypertensive heart disease   . Hyperlipidemia   . Carotid arterial disease ([place])     a. S/p R CEA;  b. 05/2015 Carotid U/S: RICA 4-54%, LICA 09-81%.  . Morbid obesity ([place])   . Tobacco abuse     Medications:  Scheduled:  . aspirin  81 mg Oral Pre-Cath  . aspirin  81 mg Oral Daily  . atorvastatin  80 mg Oral Daily  . hydrochlorothiazide  6.25 mg Oral Daily  . irbesartan  75 mg Oral Daily  . metoprolol succinate  12.5 mg Oral Daily  . nicotine  14 mg Transdermal Daily  . sodium chloride flush  3 mL Intravenous Q12H  . sodium chloride flush  3 mL Intravenous Q12H    Assessment: 89 wo man admitted 10/20/2015 for CP with SOB and numbness in L neck and face. Trop 0.38 in ED. Received ASA 325.  PMH CAD s/p MI in [place] (2012), HTN, HLD  No anticoagulants prior to admission. Hgb 14.2, plt wnl.   Initial HL is therapeutic at 0.44 on heparin 1200 units/hr. No issues with infusion or bleeding noted.  Goal of Therapy:  Heparin level 0.3-0.7 units/ml Monitor platelets by anticoagulation protocol: Yes   Plan:  Continue heparin 1200  units/hr Daily HL, CBC Monitor s/sx bleeding   Andrey Cota. Diona Foley, PharmD, BCPS Clinical Pharmacist Pager [phone]  10/21/2015,1:00 AM

## 2015-10-21 NOTE — Progress Notes (Signed)
Chaplain has given Advance Paperwork to the Pt. When The paper work is completed chaplain will call a notary

## 2015-10-21 NOTE — Progress Notes (Signed)
ANTICOAGULATION CONSULT NOTE  Pharmacy Consult for heparin Indication: chest pain/ACS  No Known Allergies  Patient Measurements: Height: 5' 11.5" (181.6 cm) Weight: 224 lb 3.3 oz (101.7 kg) IBW/kg (Calculated) : 76.45 Heparin Dosing Weight: 97.5 kg  Vital Signs: Temp: 97.6 F (36.4 C) (05/25 0800) Temp Source: Oral (05/25 0800) BP: 137/69 mmHg (05/25 1000) Pulse Rate: 60 (05/25 1000)  Labs:  Recent Labs  10/20/15 1730 10/20/15 2043 10/20/15 2357 10/21/15 0227 10/21/15 0919  HGB 14.2  --   --  12.9*  --   HCT 43.7  --   --  39.9  --   PLT 245  --   --  205  --   LABPROT  --   --   --   --  14.7  INR  --   --   --   --  1.14  HEPARINUNFRC  --   --  0.44  --  0.32  CREATININE 0.95  --   --  0.86  --   TROPONINI  --  0.41*  --  0.38* 0.32*    Estimated Creatinine Clearance: 97.9 mL/min (by C-G formula based on Cr of 0.86).   Medical History: Past Medical History  Diagnosis Date  . Coronary artery disease     a. 2012 MI in [place] w/ PCI; b. 04/2013 MV: inflat scar, no ischemia.  . Hypertensive heart disease   . Hyperlipidemia   . Carotid arterial disease ([place])     a. S/p R CEA;  b. 05/2015 Carotid U/S: RICA 8-85%, LICA 02-77%.  . Morbid obesity ([place])   . Tobacco abuse     Medications:  Scheduled:  . aspirin  81 mg Oral Daily  . atorvastatin  80 mg Oral Daily  . hydrochlorothiazide  6.25 mg Oral Daily  . irbesartan  75 mg Oral Daily  . metoprolol succinate  12.5 mg Oral Daily  . nicotine  14 mg Transdermal Daily  . sodium chloride flush  3 mL Intravenous Q12H  . sodium chloride flush  3 mL Intravenous Q12H    Assessment: 9 wo male admitted 10/20/2015 for CP with SOB and numbness in L neck and face. Trop 0.38 in ED. No anticoagulants prior to admission. Hgb 14.2, plt wnl.   HL remains therapeutic on 1200 units/hr. Patient to cath this afternoon.  Goal of Therapy:  Heparin level 0.3-0.7 units/ml Monitor platelets by anticoagulation protocol: Yes    Plan:  Continue heparin 1200 units/hr Daily HL, CBC Monitor s/sx bleeding F/u [facility] plans post-cath   Joya San, PharmD Clinical Pharmacy Resident Pager # [phone] 10/21/2015 11:03 AM

## 2015-10-21 NOTE — Interval H&P Note (Signed)
Cath Lab Visit (complete for each Cath Lab visit)  Clinical Evaluation Leading to the Procedure:   ACS: Yes.    Non-ACS:    Anginal Classification: CCS IV  Anti-ischemic medical therapy: Minimal Therapy (1 class of medications)  Non-Invasive Test Results: No non-invasive testing performed  Prior CABG: No previous CABG      History and Physical Interval Note:  10/21/2015 1:34 PM  Randy [name]  has presented today for surgery, with the diagnosis of NSTEMI  The various methods of treatment have been discussed with the patient and family. After consideration of risks, benefits and other options for treatment, the patient has consented to  Procedure(s): Left Heart Cath and Coronary Angiography (N/A) as a surgical intervention .  The patient's history has been reviewed, patient examined, no change in status, stable for surgery.  I have reviewed the patient's chart and labs.  Questions were answered to the patient's satisfaction.     Larae Grooms

## 2015-10-21 NOTE — Progress Notes (Signed)
       Patient Name: Randy [name] Date of Encounter: 10/21/2015    SUBJECTIVE:Denies chest pain.   TELEMETRY:  Sinus rhythm.  Filed Vitals:   10/21/15 0354 10/21/15 0400 10/21/15 0500 10/21/15 0800  BP: 99/55 95/46 116/65 128/67  Pulse: 57 58 59 63  Temp: 98.1 F (36.7 C)   97.6 F (36.4 C)  TempSrc: Oral   Oral  Resp: '21 23 20 16  '$ Height:      Weight: 224 lb 3.3 oz (101.7 kg)     SpO2: 93% 95% 95% 98%    Intake/Output Summary (Last 24 hours) at 10/21/15 0805 Last data filed at 10/21/15 0600  Gross per 24 hour  Intake    136 ml  Output      0 ml  Net    136 ml   LABS: Basic Metabolic Panel:  Recent Labs  10/20/15 1730 10/21/15 0227  NA 135 134*  K 3.5 3.5  CL 99* 102  CO2 23 26  GLUCOSE 153* 163*  BUN 12 12  CREATININE 0.95 0.86  CALCIUM 9.3 8.8*   CBC:  Recent Labs  10/20/15 1730 10/21/15 0227  WBC 13.7* 11.5*  HGB 14.2 12.9*  HCT 43.7 39.9  MCV 90.7 89.9  PLT 245 205   Cardiac Enzymes:  Recent Labs  10/20/15 2043 10/21/15 0227  TROPONINI 0.41* 0.38*    Physical Exam: Blood pressure 128/67, pulse 63, temperature 97.6 F (36.4 C), temperature source Oral, resp. rate 16, height 5' 11.5" (1.816 m), weight 224 lb 3.3 oz (101.7 kg), SpO2 98 %. Weight change:   Wt Readings from Last 3 Encounters:  10/21/15 224 lb 3.3 oz (101.7 kg)  08/23/15 225 lb (102.059 kg)  07/13/14 224 lb (101.606 kg)    General: NAD, laying in bed comfortably, obese Lungs: CTAB, no wheezing or crackles Cardiac: RRR, no murmurs or gallops GI: soft, active bowel sounds, non TTP Neuro: CN II-XII grossly intact Skin: warm and dry Ext: neg for pedal edema.    Assessment and plan:   1. NSTEMI- - troponins peaked at 0.41, EKG this morning reveals ST depression in lateral leads. Continue heparin gtt, BB, ASA, and statin. Having cardiac cath this afternoon.  2. HTN- controlled, continue  3. HLD - on statin HCTZ 6.'25mg'$ , torpol xl 12.'5mg'$ .  4. Tobacco abuse--  discussed tobacco cessation 6. Obesity -- discussed diet and exercise w/ patient this morning.   [name] 10/21/2015, 8:05 AM Patient seen and examined and history reviewed. Agree with above findings and plan. No further chest pain or SOB. Reports compliance with meds. BP has been well controlled. Smokes 1 ppd.  Ecg shows ST- T changes consistent with lateral ischemia. Troponin peak at .41. Findings consistent with STEMI. Reports 2 stents placed while in [place] in 2012 with MI.   Plan cardiac cath today with possible PCI. Hold off on P2Y12 inhibitor until anatomy defined. Discussed smoking cessation. Continue statin and beta blocker.   Peter Martinique, [place] 10/21/2015 9:13 AM

## 2015-10-21 NOTE — H&P (View-Only) (Signed)
       Patient Name: Randy [name] Date of Encounter: 10/21/2015    SUBJECTIVE:Denies chest pain.   TELEMETRY:  Sinus rhythm.  Filed Vitals:   10/21/15 0354 10/21/15 0400 10/21/15 0500 10/21/15 0800  BP: 99/55 95/46 116/65 128/67  Pulse: 57 58 59 63  Temp: 98.1 F (36.7 C)   97.6 F (36.4 C)  TempSrc: Oral   Oral  Resp: '21 23 20 16  '$ Height:      Weight: 224 lb 3.3 oz (101.7 kg)     SpO2: 93% 95% 95% 98%    Intake/Output Summary (Last 24 hours) at 10/21/15 0805 Last data filed at 10/21/15 0600  Gross per 24 hour  Intake    136 ml  Output      0 ml  Net    136 ml   LABS: Basic Metabolic Panel:  Recent Labs  10/20/15 1730 10/21/15 0227  NA 135 134*  K 3.5 3.5  CL 99* 102  CO2 23 26  GLUCOSE 153* 163*  BUN 12 12  CREATININE 0.95 0.86  CALCIUM 9.3 8.8*   CBC:  Recent Labs  10/20/15 1730 10/21/15 0227  WBC 13.7* 11.5*  HGB 14.2 12.9*  HCT 43.7 39.9  MCV 90.7 89.9  PLT 245 205   Cardiac Enzymes:  Recent Labs  10/20/15 2043 10/21/15 0227  TROPONINI 0.41* 0.38*    Physical Exam: Blood pressure 128/67, pulse 63, temperature 97.6 F (36.4 C), temperature source Oral, resp. rate 16, height 5' 11.5" (1.816 m), weight 224 lb 3.3 oz (101.7 kg), SpO2 98 %. Weight change:   Wt Readings from Last 3 Encounters:  10/21/15 224 lb 3.3 oz (101.7 kg)  08/23/15 225 lb (102.059 kg)  07/13/14 224 lb (101.606 kg)    General: NAD, laying in bed comfortably, obese Lungs: CTAB, no wheezing or crackles Cardiac: RRR, no murmurs or gallops GI: soft, active bowel sounds, non TTP Neuro: CN II-XII grossly intact Skin: warm and dry Ext: neg for pedal edema.    Assessment and plan:   1. NSTEMI- - troponins peaked at 0.41, EKG this morning reveals ST depression in lateral leads. Continue heparin gtt, BB, ASA, and statin. Having cardiac cath this afternoon.  2. HTN- controlled, continue  3. HLD - on statin HCTZ 6.'25mg'$ , torpol xl 12.'5mg'$ .  4. Tobacco abuse--  discussed tobacco cessation 6. Obesity -- discussed diet and exercise w/ patient this morning.   Randy [name] 10/21/2015, 8:05 AM Patient seen and examined and history reviewed. Agree with above findings and plan. No further chest pain or SOB. Reports compliance with meds. BP has been well controlled. Smokes 1 ppd.  Ecg shows ST- T changes consistent with lateral ischemia. Troponin peak at .41. Findings consistent with STEMI. Reports 2 stents placed while in [place] in 2012 with MI.   Plan cardiac cath today with possible PCI. Hold off on P2Y12 inhibitor until anatomy defined. Discussed smoking cessation. Continue statin and beta blocker.   Randy [name], [place] 10/21/2015 9:13 AM

## 2015-10-22 ENCOUNTER — Inpatient Hospital Stay (HOSPITAL_COMMUNITY): Payer: Medicare Other

## 2015-10-22 ENCOUNTER — Encounter (HOSPITAL_COMMUNITY): Payer: Self-pay | Admitting: Interventional Cardiology

## 2015-10-22 DIAGNOSIS — R079 Chest pain, unspecified: Secondary | ICD-10-CM

## 2015-10-22 LAB — ECHOCARDIOGRAM COMPLETE
Height: 71.5 in
Weight: 3622.6 oz

## 2015-10-22 LAB — BASIC METABOLIC PANEL
ANION GAP: 9 (ref 5–15)
BUN: 9 mg/dL (ref 6–20)
CHLORIDE: 104 mmol/L (ref 101–111)
CO2: 23 mmol/L (ref 22–32)
Calcium: 8.7 mg/dL — ABNORMAL LOW (ref 8.9–10.3)
Creatinine, Ser: 0.76 mg/dL (ref 0.61–1.24)
GFR calc Af Amer: >60 mL/min (ref >60)
GFR calc non Af Amer: >60 mL/min (ref >60)
GLUCOSE: 129 mg/dL — AB (ref 65–99)
POTASSIUM: 4 mmol/L (ref 3.5–5.1)
Sodium: 136 mmol/L (ref 135–145)

## 2015-10-22 LAB — CBC
HCT: 40.7 % (ref 39.0–52.0)
Hemoglobin: 13.3 g/dL (ref 13.0–17.0)
MCH: 29.5 pg (ref 26.0–34.0)
MCHC: 32.7 g/dL (ref 30.0–36.0)
MCV: 90.2 fL (ref 78.0–100.0)
Platelets: 197 10*3/uL (ref 150–400)
RBC: 4.51 MIL/uL (ref 4.22–5.81)
RDW: 13.2 % (ref 11.5–15.5)
WBC: 11.1 10*3/uL — AB (ref 4.0–10.5)

## 2015-10-22 MED ORDER — NICOTINE 14 MG/24HR TD PT24: 14.0000 mg | MEDICATED_PATCH | Freq: Every day | TRANSDERMAL | Status: DC

## 2015-10-22 MED ORDER — ATORVASTATIN CALCIUM 80 MG PO TABS: 80.0000 mg | ORAL_TABLET | Freq: Every day | ORAL | Status: DC

## 2015-10-22 MED ORDER — IRBESARTAN 75 MG PO TABS: 75.0000 mg | ORAL_TABLET | Freq: Every day | ORAL | Status: DC

## 2015-10-22 MED ORDER — HYDROCHLOROTHIAZIDE 12.5 MG PO CAPS: ORAL_CAPSULE | ORAL | Status: DC

## 2015-10-22 MED ORDER — TICAGRELOR 90 MG PO TABS: 90.0000 mg | ORAL_TABLET | Freq: Two times a day (BID) | ORAL | Status: DC

## 2015-10-22 MED ORDER — NITROGLYCERIN 0.4 MG SL SUBL: 0.4000 mg | SUBLINGUAL_TABLET | SUBLINGUAL | Status: DC | PRN

## 2015-10-22 MED FILL — Tirofiban HCl in NaCl 0.9% IV Soln 5 MG/100ML (Base Equiv): INTRAVENOUS | Qty: 100 | Status: AC

## 2015-10-22 NOTE — Progress Notes (Signed)
Patient discharged, all IV's removed, all patient belongings returned to patient, and discharge instructions reviewed with patient.  Patient had no questions.

## 2015-10-22 NOTE — Progress Notes (Signed)
CARDIAC REHAB PHASE I   PRE:  Rate/Rhythm: 61 SR  BP:  Supine: 130/65  Sitting:   Standing:    SaO2: 100%RA  MODE:  Ambulation: 700 ft   POST:  Rate/Rhythm: 83 SR  BP:  Supine:   Sitting: 146/72  Standing:    SaO2: 100%RA 1050-1140 Pt walked 700 ft on RA with steady gait. No CP. MI education completed with pt and wife. Understanding voiced. Stressed importance of brilinta with stent. Needs to see case manager. Discussed CRP 2 and will refer to [place]. Pt only eats one meal a day and that is at night. Discussed healthier food choices. Reviewed NTG use, ex ed and smoking cessation. Gave fake cigarette and smoking cessation handout. Pt stated he quit once for three months.   Graylon Good, RN BSN  10/22/2015 11:37 AM

## 2015-10-22 NOTE — Discharge Instructions (Signed)
Start taking Brilinta twice a day along with Aspirin everyday.   For your blood pressure take them as directed on this handout.

## 2015-10-22 NOTE — Progress Notes (Signed)
  Echocardiogram 2D Echocardiogram has been performed.  Randy [name] 10/22/2015, 1:54 PM

## 2015-10-22 NOTE — Progress Notes (Signed)
Spoke w pt. Gave pt 30day free brilitna card. Pt has ins that covers meds he states.

## 2015-10-22 NOTE — Discharge Summary (Signed)
Physician Discharge Summary    Patient ID: Randy [name] MRN: [id number] DOB/AGE: 1945/06/06 70 y.o.  Admit date: 10/20/2015 Discharge date: 10/22/2015  Primary Discharge Diagnosis: NSTEMI Secondary Discharge Diagnoses: HTN, HLD, tobacco abuse, obesity  Significant Diagnostic Studies: Cardiac Cath 5/25: Prox Cx lesion, 90% stenosed. Post intervention with a 4.0 x 20 mm Promus Premier stent, postdilated to 4.5 mm, there is a 0% residual stenosis. Ost Mrg lesion, 95% stenosed instent-TIMI 2 flow. This was the culprit vessel for the non-STEMI. Post intervention with a 2.5 x 10 Angiosculpt scoring balloon, there is a 0% residual stenosis. The lesion was previously treated with a stent (unknown type) in 2014 when the patient was in [place].   Consults: none  Hospital Course: 70 y/o ? with a h/o CAD s/p MI in [place] in 2012 @ which time he underwent stenting x 2. He also has a h/o HTN, HL, obesity, and tobacco abuse. He has been followed by Dr. Harrington Challenger as an outpt with low risk MV in 2014, nl EF by echo in 2014, nl ABI's in 09/2014, and moderate carotid dzs by u/s in 05/2015. He lives locally with his wife and last travelled to [place] in March of this year. He does not routinely exercise but generally does not experience any cp or dyspnea with usual activities.  On admission, he and his wife went to [facility] and upon returning, he was carrying his goods up the stairs into his home and developed profound dyspnea. He sat and rested and felt that his heart was skipping, so he checked his carotid pulse and noted "skipped beats," which concerned him. He was not having any chest pain or lightheadedness, and dyspnea resolved after sitting for about 45 seconds. He was most concerned about his heart skipping and so he presented to the ED where ECG showed RSR, prior inferior infarct and otw no acute changes. His troponin returned elevated @ 0.38. WBC 13.7. Labs otw nl. Since arrival in the ED, he had no further  dyspnea or palpitations.   Troponins peaked at 0.41. He was immediately started on a heparin gtt and taken to cath lab the following day revealing Prox Cx lesion, 90% stenosed. Post intervention with a 4.0 x 20 mm Promus Premier stent, postdilated to 4.5 mm, there is a 0% residual stenosis. Ost Mrg lesion, 95% stenosed instent-TIMI 2 flow. This was the culprit vessel for the non-STEMI. Post intervention with a 2.5 x 10 Angiosculpt scoring balloon, there is a 0% residual stenosis. The lesion was previously treated with a stent (unknown type) in 2014 when the patient was in [place]. He was started on brilinta '90mg'$  BID and asa x 1 year. He was counseled on diet, tobacco cessation, and exercise. On day of discharge an ECHO was ordered to assess LV function, prelim read by Dr. Martinique reveals LVEF around 50%, he was then discharged home w/ follow up with Richardson Dopp on 11/04/15.     Discharge Exam: Blood pressure 146/72, pulse 61, temperature 98 F (36.7 C), temperature source Oral, resp. rate 23, height 5' 11.5" (1.816 m), weight 226 lb 6.6 oz (102.7 kg), SpO2 95 %.   General: NAD, laying in bed comfortably, obese Lungs: CTAB, no wheezing or crackles Cardiac: RRR, no murmurs or gallops GI: soft, active bowel sounds, non TTP Neuro: CN II-XII grossly intact Skin: warm and dry Ext: neg for pedal edema.  Labs:   Lab Results  Component Value Date   WBC 11.1* 10/22/2015   HGB 13.3 10/22/2015  HCT 40.7 10/22/2015   MCV 90.2 10/22/2015   PLT 197 10/22/2015    Recent Labs Lab 10/21/15 0227  10/22/15 0243  NA 134*  --  136  K 3.5  --  4.0  CL 102  --  104  CO2 26  --  23  BUN 12  --  9  CREATININE 0.86  < > 0.76  CALCIUM 8.8*  --  8.7*  PROT 6.3*  --   --   BILITOT 0.4  --   --   ALKPHOS 62  --   --   ALT 22  --   --   AST 19  --   --   GLUCOSE 163*  --  129*  < > = values in this interval not displayed. Lab Results  Component Value Date   TROPONINI 0.32* 10/21/2015    Lab Results   Component Value Date   CHOL 119* 09/03/2015   Lab Results  Component Value Date   HDL 35* 09/03/2015   Lab Results  Component Value Date   LDLCALC 65 09/03/2015   Lab Results  Component Value Date   TRIG 97 09/03/2015   Lab Results  Component Value Date   CHOLHDL 3.4 09/03/2015      Radiology: Dg Chest Port 1 View  10/20/2015 CLINICAL DATA: Numbness in the left side the lips. EXAM: PORTABLE CHEST 1 VIEW COMPARISON: None. FINDINGS: The heart size and mediastinal contours are within normal limits. There is no focal infiltrate, pulmonary edema, or pleural effusion. The visualized skeletal structures are unremarkable. IMPRESSION: No active cardiopulmonary disease. Electronically Signed By: Abelardo Diesel M.D. On: 10/20/2015 18:41   EKG: RSR, 73, inf infarct, non-specific st/t changes.  FOLLOW UP PLANS AND APPOINTMENTS Discharge Instructions    Amb Referral to Cardiac Rehabilitation    Complete by:  As directed   Diagnosis:   NSTEMI Coronary Stents       Diet - low sodium heart healthy    Complete by:  As directed      Increase activity slowly    Complete by:  As directed             Medication List    STOP taking these medications        irbesartan-hydrochlorothiazide 150-12.5 MG tablet  Commonly known as:  AVALIDE      TAKE these medications        aspirin 81 MG tablet  Take 81 mg by mouth daily.     atorvastatin 80 MG tablet  Commonly known as:  LIPITOR  Take 1 tablet (80 mg total) by mouth daily.     hydrochlorothiazide 12.5 MG capsule  Commonly known as:  MICROZIDE  Take 1/2 tab daily ( 6.'25mg'$ )     irbesartan 75 MG tablet  Commonly known as:  AVAPRO  Take 1 tablet (75 mg total) by mouth daily.     metoprolol succinate 25 MG 24 hr tablet  Commonly known as:  TOPROL XL  Take 0.5 tablets (12.5 mg total) by mouth daily.     nicotine 14 mg/24hr patch  Commonly known as:  NICODERM CQ - dosed in mg/24 hours  Place 1 patch (14 mg total) onto  the skin daily.     nitroGLYCERIN 0.4 MG SL tablet  Commonly known as:  NITROSTAT  Place 1 tablet (0.4 mg total) under the tongue every 5 (five) minutes x 3 doses as needed for chest pain.     ticagrelor 90 MG Tabs tablet  Commonly known as:  BRILINTA  Take 1 tablet (90 mg total) by mouth 2 (two) times daily.     varenicline 0.5 MG tablet  Commonly known as:  CHANTIX  Take 1 tablet (0.5 mg total) by mouth 2 (two) times daily.           Follow-up Information    Follow up with Richardson Dopp, PA-C On 11/04/2015.   Specialties:  Cardiology, Physician Assistant   Why:  at 9:15am for hospital follow up.    Contact information:   [address] [place] [address] [phone]       [facility] ALL MEDICATIONS WITH YOU TO FOLLOW UP APPOINTMENTS   Signed: Julious Oka 10/22/2015, 2:12 PM

## 2015-10-22 NOTE — Progress Notes (Signed)
Patient Name: GYASI HAZZARD Date of Encounter: 10/22/2015    SUBJECTIVE: Denies CP, SOB. Only having pain from [facility] site.   TELEMETRY:  Sinus rhythm.  Filed Vitals:   10/22/15 0400 10/22/15 0500 10/22/15 0600 10/22/15 0700  BP: 117/55 117/51 115/59 98/52  Pulse: 54 66 54 51  Temp: 97.7 F (36.5 C)     TempSrc: Oral     Resp: '19 18 20 18  '$ Height:      Weight:  226 lb 6.6 oz (102.7 kg)    SpO2: 96% 97% 96% 96%    Intake/Output Summary (Last 24 hours) at 10/22/15 0751 Last data filed at 10/22/15 0200  Gross per 24 hour  Intake 1158.11 ml  Output    575 ml  Net 583.11 ml   LABS: Basic Metabolic Panel:  Recent Labs  10/21/15 0227 10/21/15 1617 10/22/15 0243  NA 134*  --  136  K 3.5  --  4.0  CL 102  --  104  CO2 26  --  23  GLUCOSE 163*  --  129*  BUN 12  --  9  CREATININE 0.86 0.81 0.76  CALCIUM 8.8*  --  8.7*   CBC:  Recent Labs  10/21/15 1617 10/21/15 1935 10/22/15 0243  WBC 9.8  --  11.1*  HGB 13.5  --  13.3  HCT 42.2  --  40.7  MCV 93.6  --  90.2  PLT 204 218 197   Cardiac Enzymes:  Recent Labs  10/20/15 2043 10/21/15 0227 10/21/15 0919  TROPONINI 0.41* 0.38* 0.32*    Physical Exam: Blood pressure 98/52, pulse 51, temperature 97.7 F (36.5 C), temperature source Oral, resp. rate 18, height 5' 11.5" (1.816 m), weight 226 lb 6.6 oz (102.7 kg), SpO2 96 %. Weight change: 1 lb 6.6 oz (0.641 kg)  Wt Readings from Last 3 Encounters:  10/22/15 226 lb 6.6 oz (102.7 kg)  08/23/15 225 lb (102.059 kg)  07/13/14 224 lb (101.606 kg)    General: NAD, laying in bed comfortably, obese Lungs: CTAB, no wheezing or crackles Cardiac: RRR, no murmurs or gallops GI: soft, active bowel sounds, non TTP Neuro: CN II-XII grossly intact Skin: warm and dry Ext: neg for pedal edema.    Assessment and plan:  1. NSTEMI- s/p LHC 5/25 w/ Prox Cx lesion, 90% stenosed. Post intervention with a 4.0 x 20 mm Promus Premier stent, postdilated to 4.5 mm, there  is a 0% residual stenosis. Ost Mrg lesion, 95% stenosed instent-TIMI 2 flow. This was the culprit vessel for the non-STEMI. Post intervention with a 2.5 x 10 Angiosculpt scoring balloon, there is a 0% residual stenosis. The lesion was previously treated with a stent (unknown type) in 2014 when the patient was in [place]. Continue asa and brillinta x 1 year along with lifestyle modifications.  - ECHO pending.  - encouraged cardiac rehab - possible d/c home today 2. HTN- controlled, HCTZ 6.'25mg'$ , torpol xl 12.'5mg'$ , irbesartan '75mg'$   3. HLD - on statin  4. Tobacco abuse-- discussed tobacco cessation, nicotine patch 6. Obesity -- discussed diet and exercise   Signed, Julious Oka 10/22/2015, 7:51 AM  Patient seen and examined and history reviewed. Agree with above findings and plan. Doing well post PCI. Findings of in stent restenosis in the first OM treated with POBA. Stent in mid LCx patent. New lesion in proximal LCx treated with DES. No chest pain. VSS. Labs OK. No radial site hematoma.  Will assess LV  function today with Echo. Plan to DC later today. Continue DAPT with ASA and Brilinta for one year. Discussed smoking cessation. On high dose statin.  Peter Martinique, [place] 10/22/2015 9:24 AM

## 2015-10-29 ENCOUNTER — Emergency Department (HOSPITAL_COMMUNITY): Payer: Medicare Other

## 2015-10-29 ENCOUNTER — Telehealth: Payer: Self-pay | Admitting: Internal Medicine

## 2015-10-29 ENCOUNTER — Emergency Department (HOSPITAL_COMMUNITY)
Admission: EM | Admit: 2015-10-29 | Discharge: 2015-10-29 | Disposition: A | Discharge status: Home / Self Care | Payer: Medicare Other | Attending: Emergency Medicine | Admitting: Emergency Medicine

## 2015-10-29 ENCOUNTER — Encounter (HOSPITAL_COMMUNITY): Payer: Self-pay | Admitting: *Deleted

## 2015-10-29 DIAGNOSIS — R079 Chest pain, unspecified: Secondary | ICD-10-CM

## 2015-10-29 DIAGNOSIS — R0789 Other chest pain: Secondary | ICD-10-CM | POA: Diagnosis not present

## 2015-10-29 DIAGNOSIS — Z7982 Long term (current) use of aspirin: Secondary | ICD-10-CM | POA: Insufficient documentation

## 2015-10-29 DIAGNOSIS — I2510 Atherosclerotic heart disease of native coronary artery without angina pectoris: Secondary | ICD-10-CM | POA: Insufficient documentation

## 2015-10-29 DIAGNOSIS — I119 Hypertensive heart disease without heart failure: Secondary | ICD-10-CM | POA: Insufficient documentation

## 2015-10-29 DIAGNOSIS — E785 Hyperlipidemia, unspecified: Secondary | ICD-10-CM | POA: Diagnosis not present

## 2015-10-29 DIAGNOSIS — Z79899 Other long term (current) drug therapy: Secondary | ICD-10-CM | POA: Insufficient documentation

## 2015-10-29 DIAGNOSIS — Z9889 Other specified postprocedural states: Secondary | ICD-10-CM | POA: Diagnosis not present

## 2015-10-29 DIAGNOSIS — Z87891 Personal history of nicotine dependence: Secondary | ICD-10-CM | POA: Insufficient documentation

## 2015-10-29 DIAGNOSIS — M7981 Nontraumatic hematoma of soft tissue: Secondary | ICD-10-CM | POA: Insufficient documentation

## 2015-10-29 DIAGNOSIS — Z9861 Coronary angioplasty status: Secondary | ICD-10-CM | POA: Diagnosis not present

## 2015-10-29 LAB — BASIC METABOLIC PANEL
Anion gap: 9 (ref 5–15)
BUN: 19 mg/dL (ref 6–20)
CHLORIDE: 97 mmol/L — AB (ref 101–111)
CO2: 26 mmol/L (ref 22–32)
CREATININE: 0.96 mg/dL (ref 0.61–1.24)
Calcium: 9.7 mg/dL (ref 8.9–10.3)
GFR calc Af Amer: >60 mL/min (ref >60)
GFR calc non Af Amer: >60 mL/min (ref >60)
GLUCOSE: 109 mg/dL — AB (ref 65–99)
POTASSIUM: 4.3 mmol/L (ref 3.5–5.1)
Sodium: 132 mmol/L — ABNORMAL LOW (ref 135–145)

## 2015-10-29 LAB — CBC
HEMATOCRIT: 45.3 % (ref 39.0–52.0)
Hemoglobin: 15.2 g/dL (ref 13.0–17.0)
MCH: 30.2 pg (ref 26.0–34.0)
MCHC: 33.6 g/dL (ref 30.0–36.0)
MCV: 90.1 fL (ref 78.0–100.0)
PLATELETS: 286 10*3/uL (ref 150–400)
RBC: 5.03 MIL/uL (ref 4.22–5.81)
RDW: 12.9 % (ref 11.5–15.5)
WBC: 13.8 10*3/uL — ABNORMAL HIGH (ref 4.0–10.5)

## 2015-10-29 LAB — I-STAT TROPONIN, ED: Troponin i, poc: 0.06 ng/mL (ref 0.00–0.08)

## 2015-10-29 NOTE — ED Notes (Signed)
Brought patient back to room via wheelchair; patient undressed, in gown, on monitor, continuous pulse oximetry and blood pressure cuff

## 2015-10-29 NOTE — Telephone Encounter (Signed)
Reviewed with Almyra Deforest, PA-  Per Isaac Laud- Pt should report to ED now for further evaluation.  Pt advised, verbalized understanding,agreed with plan. Pt advised not to drive.

## 2015-10-29 NOTE — ED Provider Notes (Signed)
CSN: [id number]     Arrival date & time 10/29/15  1140 History   First MD Initiated Contact with Patient 10/29/15 1337     Chief Complaint  Patient presents with  . Chest Pain     (Consider location/radiation/quality/duration/timing/severity/associated sxs/prior Treatment) HPI    Randy [name] is a 70 y.o. male who presents for evaluation of left lateral chest pain, in the area of the anterior axilla. The pain is intermittent, lasting just a second, and usually occurs about every 15 minutes. He describes the pain as "mild". There is no associated nausea, vomiting, cough, shortness of breath, persistent chest pain, dizziness, or paresthesias. He was concerned this might be a heart problem, so called his cardiologist, and a nurse there recommended that he come to the ED. He's been doing well post recent catheterization, with cardiac stenting. He is taking all his medications as directed. There are no other known modifying factors.  Past Medical History  Diagnosis Date  . Coronary artery disease     a. 2012 MI in [place] w/ PCI; b. 04/2013 MV: inflat scar, no ischemia.  . Hypertensive heart disease   . Hyperlipidemia   . Carotid arterial disease ([place])     a. S/p R CEA;  b. 05/2015 Carotid U/S: RICA 7-82%, LICA 95-62%.  . Morbid obesity ([place])   . Tobacco abuse    Past Surgical History  Procedure Laterality Date  . Cardiac catheterization    . Carotid endarterectomy    . Coronary angioplasty    . Cardiac catheterization N/A 10/21/2015    Procedure: Left Heart Cath and Coronary Angiography;  Surgeon: [name], MD;  Location: [facility];  Service: Cardiovascular;  Laterality: N/A;  . Cardiac catheterization N/A 10/21/2015    Procedure: Coronary Stent Intervention;  Surgeon: [name], MD;  Location: [facility];  Service: Cardiovascular;  Laterality: N/A;  . Cardiac catheterization N/A 10/21/2015    Procedure: Coronary Balloon Angioplasty;  Surgeon:  [name], MD;  Location: [facility];  Service: Cardiovascular;  Laterality: N/A;  . Coronary stent placement     Family History  Problem Relation Age of Onset  . Hypertension Father     died in [place] @ 72  . Other Mother     died @ 76 - complications following surgery.   Social History  Substance Use Topics  . Smoking status: Former Smoker -- 0.75 packs/day for 40 years    Types: Cigarettes    Start date: 06/18/1972    Quit date: 10/24/2015  . Smokeless tobacco: None  . Alcohol Use: No    Review of Systems  All other systems reviewed and are negative.     Allergies  Review of patient's allergies indicates no known allergies.  Home Medications   Prior to Admission medications   Medication Sig Start Date End Date Taking? Authorizing Provider  aspirin EC 81 MG tablet Take 81 mg by mouth daily.   Yes Historical Provider, MD  atorvastatin (LIPITOR) 40 MG tablet Take 40 mg by mouth daily.   Yes Historical Provider, MD  irbesartan-hydrochlorothiazide (AVALIDE) 150-12.5 MG tablet Take 0.5 tablets by mouth daily. 08/23/15  Yes Historical Provider, MD  metoprolol succinate (TOPROL XL) 25 MG 24 hr tablet Take 0.5 tablets (12.5 mg total) by mouth daily. 08/23/15  Yes Fay Records, MD  ticagrelor (BRILINTA) 90 MG TABS tablet Take 1 tablet (90 mg total) by mouth 2 (two) times daily. 10/22/15  Yes  Norman Herrlich, MD  atorvastatin (LIPITOR) 80 MG tablet Take 1 tablet (80 mg total) by mouth daily. Patient not taking: Reported on 10/29/2015 10/22/15   Norman Herrlich, MD  hydrochlorothiazide (MICROZIDE) 12.5 MG capsule Take 1/2 tab daily ( 6.'25mg'$ ) Patient not taking: Reported on 10/29/2015 10/22/15   Norman Herrlich, MD  irbesartan (AVAPRO) 75 MG tablet Take 1 tablet (75 mg total) by mouth daily. Patient not taking: Reported on 10/29/2015 10/22/15   Norman Herrlich, MD  nicotine (NICODERM CQ - DOSED IN MG/24 HOURS) 14 mg/24hr patch Place 1 patch (14 mg total) onto the skin daily. Patient  not taking: Reported on 10/29/2015 10/22/15   Norman Herrlich, MD  nitroGLYCERIN (NITROSTAT) 0.4 MG SL tablet Place 1 tablet (0.4 mg total) under the tongue every 5 (five) minutes x 3 doses as needed for chest pain. Patient not taking: Reported on 10/29/2015 10/22/15   Norman Herrlich, MD  varenicline (CHANTIX) 0.5 MG tablet Take 1 tablet (0.5 mg total) by mouth 2 (two) times daily. Patient not taking: Reported on 10/29/2015 08/23/15   Fay Records, MD   BP 114/76 mmHg  Pulse 60  Temp(Src) 97.8 F (36.6 C) (Oral)  Resp 18  Ht '5\' 11"'$  (1.803 m)  Wt 225 lb (102.059 kg)  BMI 31.39 kg/m2  SpO2 99% Physical Exam  Constitutional: He is oriented to person, place, and time. He appears well-developed and well-nourished. No distress.  HENT:  Head: Normocephalic and atraumatic.  Right Ear: External ear normal.  Left Ear: External ear normal.  Eyes: Conjunctivae and EOM are normal. Pupils are equal, round, and reactive to light.  Neck: Normal range of motion and phonation normal. Neck supple.  Cardiovascular: Normal rate, regular rhythm and normal heart sounds.   Pulmonary/Chest: Effort normal and breath sounds normal. No respiratory distress. He exhibits tenderness (Mild tenderness left lateral pectoralis region. No deformity in this area.). He exhibits no bony tenderness.  Abdominal: Soft. There is no tenderness.  Musculoskeletal: Normal range of motion. He exhibits no edema or tenderness.  Neurological: He is alert and oriented to person, place, and time. No cranial nerve deficit or sensory deficit. He exhibits normal muscle tone. Coordination normal.  Skin: Skin is warm, dry and intact.  Scattered bruising of the forearms bilaterally, consistent with recent hospitalization and catheterization, from vascular access trauma.  Psychiatric: He has a normal mood and affect. His behavior is normal. Judgment and thought content normal.  Nursing note and vitals reviewed.   ED Course  Procedures (including  critical care time)  Medications - No data to display  Patient Vitals for the past 24 hrs:  BP Temp Temp src Pulse Resp SpO2 Height Weight  10/29/15 1336 114/76 mmHg - - 60 18 99 % - -  10/29/15 1150 133/65 mmHg 97.8 F (36.6 C) Oral 73 16 97 % '5\' 11"'$  (1.803 m) 225 lb (102.059 kg)    14:05- Cardiology paged. Call returned at 15:25, triage nurse states that she will have the cardiologist call me. Dr. Harrington Challenger responded; she agrees with discharge at this time, 15:40  3:44 PM Reevaluation with update and discussion. After initial assessment and treatment, an updated evaluation reveals he remains comfortable. Findings discussed with patient and family member, all questions answered. Faron Tudisco L    Labs Review Labs Reviewed  BASIC METABOLIC PANEL - Abnormal; Notable for the following:    Sodium 132 (*)    Chloride 97 (*)    Glucose, Bld 109 (*)  All other components within normal limits  CBC - Abnormal; Notable for the following:    WBC 13.8 (*)    All other components within normal limits  I-STAT TROPOININ, ED    Imaging Review Dg Chest 2 View  10/29/2015  CLINICAL DATA:  Chest pain EXAM: CHEST  2 VIEW COMPARISON:  10/20/2015 FINDINGS: Normal heart size. Cardiac stents are identified. No pleural effusion or edema identified. No airspace consolidation noted. Spondylosis noted within the thoracic spine. IMPRESSION: 1. No acute cardiopulmonary abnormalities. Electronically Signed   By: Kerby Moors M.D.   On: 10/29/2015 12:33   I have personally reviewed and evaluated these images and lab results as part of my medical decision-making.   EKG Interpretation   Date/Time:  Friday October 29 2015 11:47:44 EDT Ventricular Rate:  68 PR Interval:  162 QRS Duration: 90 QT Interval:  380 QTC Calculation: 404 R Axis:   37 Text Interpretation:  Normal sinus rhythm Possible Inferior infarct , age  undetermined Abnormal ECG since last tracing no significant change  Confirmed by Eulis Foster  MD,  Marirose Deveney [phone]) on 10/29/2015 1:37:40 PM      MDM   Final diagnoses:  Nonspecific chest pain    Noncardiac chest pain, with reassuring evaluation. Doubt ACS, PE or pneumonia.  Nursing Notes Reviewed/ Care Coordinated Applicable Imaging Reviewed Interpretation of Laboratory Data incorporated into ED treatment  The patient appears reasonably screened and/or stabilized for discharge and I doubt any other medical condition or other [facility] requiring further screening, evaluation, or treatment in the ED at this time prior to discharge.  Plan: Home Medications- usual; Home Treatments- rest; return here if the recommended treatment, does not improve the symptoms; Recommended follow up- PCP prn     Daleen Bo, MD 10/29/15 1545

## 2015-10-29 NOTE — Discharge Instructions (Signed)

## 2015-10-29 NOTE — Telephone Encounter (Signed)
Pt states starting yesterday he noticed a pain on the left side above his left breast. Pt states he did not have symptoms during the night but symptoms started again this morning. Pt states that the pain is very light dull pain, lasts for a second, comes and goes every few minutes. Pt denies any other symptoms, including shortness of breath or nausea. Pt states BP 125/72 pulse 64.

## 2015-10-29 NOTE — ED Notes (Signed)
PT was discharged from [facility] last Fri after having 2 coronary artery stents placed.  Began experiencing L sided chest pain yesterday.  Pt called his cardiologist today who stated to come here.

## 2015-10-29 NOTE — Telephone Encounter (Signed)
New Message  Pt called states that he recently had a few stents and he has a slight pain on the left side of his chest just above his breast. Pt is requesting a call back to determine if he should come in.

## 2015-11-03 NOTE — Progress Notes (Signed)
Cardiology Office Note:    Date:  11/04/2015   ID:  Randy [name], DOB 06/22/1945, MRN [id number]  PCP:  Gerrit Heck, MD  Cardiologist:  Dr. [name]   Electrophysiologist:  n/a  Referring MD: Leighton Ruff, MD   Chief Complaint  Patient presents with  . Hospitalization Follow-up    a. s/p NSTEMI; b. ED visit 6/2 with chest pain    History of Present Illness:     Randy [name] is a 70 y.o. male with a hx of CAD s/p PCI in setting of MI while in [place] in 2012, carotid artery disease s/p R CEA, HTN, HL, tobacco abuse.  Last seen by Dr. Harrington Challenger 3/17.  Admitted 5/24-5/26 with non-STEMI. Cardiac catheterization demonstrated 90% proximal LCx stenosis treated with a Promus DES and 95% OM1 in-stent restenosis treated with POBA (angioscope scoring balloon). Echocardiogram demonstrated mild LVH, EF 55% and possible inferolateral hypokinesis, moderate diastolic dysfunction and trace AI.  This PCI course was uneventful. He was discharged on aspirin, Brilinta, Lipitor 80, Avapro, Toprol.    He returned to the emergency room 10/29/15 with chest discomfort. Cardiac markers are negative. Chest x-ray was unremarkable. Chest symptoms are felt to be atypical for ischemia and he was DC to home.  Returns for FU.  Here alone. He is doing well.  The patient denies any chest pain, significant dyspnea, syncope, orthopnea, PND, edema.   Past Medical History  Diagnosis Date  . Hypertensive heart disease   . Hyperlipidemia   . Carotid arterial disease ([place])     a. S/p R CEA;  b. 05/2015 Carotid U/S: RICA 8-84%, LICA 16-60%.  . Morbid obesity ([place])   . Tobacco abuse   . Coronary artery disease     a. 2012 MI in [place] w/ PCI  //  b. 04/2013 MV: inflat scar, no ischemia.  //  c.   NSTEMI 5/17 >> Promus DES to LCx and POBA to OM1 (ISR).   [name] History of echocardiogram     a. Mild LVH, EF 50-55%, possible inferolateral HK, grade 2 diastolic dysfunction, trivial AI, MAC  . History of cardiac  catheterization 10/21/15    a. NSTEMI 5/17: oLAD 40, pLCx 90 (Promus DES), OM2 90 ISR (POBA), oRCA 25   . History of nuclear stress test 04/2013    a. Myoview 12/14: Intermediate risk, prior inferolateral MI, no ischemia, EF 45%    Past Surgical History  Procedure Laterality Date  . Cardiac catheterization    . Carotid endarterectomy    . Coronary angioplasty    . Cardiac catheterization N/A 10/21/2015    Procedure: Left Heart Cath and Coronary Angiography;  Surgeon: [name], MD;  Location: [place] [facility];  Service: Cardiovascular;  Laterality: N/A;  . Cardiac catheterization N/A 10/21/2015    Procedure: Coronary Stent Intervention;  Surgeon: [name], MD;  Location: [place] [facility];  Service: Cardiovascular;  Laterality: N/A;  . Cardiac catheterization N/A 10/21/2015    Procedure: Coronary Balloon Angioplasty;  Surgeon: [name], MD;  Location: [place] [facility];  Service: Cardiovascular;  Laterality: N/A;  . Coronary stent placement      Current Medications: Outpatient Prescriptions Prior to Visit  Medication Sig Dispense Refill  . aspirin EC 81 MG tablet Take 81 mg by mouth daily.    . irbesartan-hydrochlorothiazide (AVALIDE) 150-12.5 MG tablet Take 0.5 tablets by mouth daily.  3  . metoprolol succinate (TOPROL XL) 25 MG 24 hr tablet Take 0.5  tablets (12.5 mg total) by mouth daily. 45 tablet 3  . nitroGLYCERIN (NITROSTAT) 0.4 MG SL tablet Place 1 tablet (0.4 mg total) under the tongue every 5 (five) minutes x 3 doses as needed for chest pain. 30 tablet 12  . atorvastatin (LIPITOR) 40 MG tablet Take 40 mg by mouth daily.    . ticagrelor (BRILINTA) 90 MG TABS tablet Take 1 tablet (90 mg total) by mouth 2 (two) times daily. 60 tablet 11  . atorvastatin (LIPITOR) 80 MG tablet Take 1 tablet (80 mg total) by mouth daily. (Patient not taking: Reported on 10/29/2015) 30 tablet 11  . hydrochlorothiazide (MICROZIDE) 12.5 MG capsule Take 1/2 tab daily (  6.'25mg'$ ) (Patient not taking: Reported on 10/29/2015) 30 capsule 1  . irbesartan (AVAPRO) 75 MG tablet Take 1 tablet (75 mg total) by mouth daily. (Patient not taking: Reported on 10/29/2015) 30 tablet 1  . nicotine (NICODERM CQ - DOSED IN MG/24 HOURS) 14 mg/24hr patch Place 1 patch (14 mg total) onto the skin daily. (Patient not taking: Reported on 10/29/2015) 28 patch 0  . varenicline (CHANTIX) 0.5 MG tablet Take 1 tablet (0.5 mg total) by mouth 2 (two) times daily. (Patient not taking: Reported on 10/29/2015) 60 tablet 0   No facility-administered medications prior to visit.      Allergies:   Review of patient's allergies indicates no known allergies.   Social History   Social History  . Marital Status: Married    Spouse Name: ardith  . Number of Children: 7  . Years of Education: college   Occupational History  . retired    Social History Main Topics  . Smoking status: Former Smoker -- 0.75 packs/day for 40 years    Types: Cigarettes    Start date: 06/18/1972    Quit date: 10/24/2015  . Smokeless tobacco: None  . Alcohol Use: No  . Drug Use: No  . Sexual Activity: Not Asked   Other Topics Concern  . None   Social History Narrative   Lives in [place] with wife.  Does not routinely exercise.  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     Family History:  The patient's family history includes Hypertension in his father; Other in his mother.   ROS:   Please see the history of present illness.    Review of Systems  Cardiovascular: Positive for dyspnea on exertion.  Hematologic/Lymphatic: Bruises/bleeds easily.   All other systems reviewed and are negative.   Physical Exam:    VS:  BP 122/78 mmHg  Pulse 60  Ht '5\' 11"'$  (1.803 m)  Wt 229 lb 12.8 oz (104.237 kg)  BMI 32.06 kg/m2   Physical Exam  Constitutional: He is oriented to person, place, and time. He appears well-developed and well-nourished.  HENT:  Head: Normocephalic and  atraumatic.  Neck: Normal range of motion. No JVD present.  Cardiovascular: Normal rate, regular rhythm and normal heart sounds.   No murmur heard. Pulmonary/Chest: Breath sounds normal. He has no wheezes. He has no rales.  Abdominal: Soft. Bowel sounds are normal. There is no tenderness.  Musculoskeletal: Normal range of motion. He exhibits no edema.  right wrist without hematoma or mass   Neurological: He is alert and oriented to person, place, and time.  Skin: Skin is warm and dry.  Psychiatric: He has a normal mood and affect.    Wt Readings from Last 3 Encounters:  11/04/15 229 lb 12.8 oz (104.237 kg)  10/29/15 225 lb (102.059 kg)  10/22/15 226 lb 6.6 oz (102.7 kg)      Studies/Labs Reviewed:     EKG:  EKG is  ordered today.  The ekg ordered today demonstrates NSR, HR 61, normal axis, NSSTTW changes, QTc 434 ms, no sig changes  Recent Labs: 10/20/2015: TSH 2.230 10/21/2015: ALT 22 10/29/2015: BUN 19; Creatinine, Ser 0.96; Hemoglobin 15.2; Platelets 286; Potassium 4.3; Sodium 132*   Recent Lipid Panel    Component Value Date/Time   CHOL 119* 09/03/2015 1124   TRIG 97 09/03/2015 1124   HDL 35* 09/03/2015 1124   CHOLHDL 3.4 09/03/2015 1124   VLDL 19 09/03/2015 1124   LDLCALC 65 09/03/2015 1124    Additional studies/ records that were reviewed today include:   Echo 10/22/15 - Left ventricle: The cavity size was normal. Wall thickness was  increased in a pattern of mild LVH. Systolic function was normal.  The estimated ejection fraction was in the range of 50% to 55%.  Possible hypokinesis of the basalinferolateral myocardium.  Features are consistent with a pseudonormal left ventricular  filling pattern, with concomitant abnormal relaxation and  increased filling pressure (grade 2 diastolic dysfunction). - Aortic valve: There was trivial regurgitation. - Mitral valve: Calcified annulus. Impressions: - Mild hypokinesis of the basal inferior lateral wall with  overall  preserved LV function; grade 2 diastolic dysfunction; mild LVH;  calcified aortic valve with trace AI.  LHC 10/21/15 LAD ost 40% LCx prox 90%, OM2 90% ISR RCA ost 25% PCI: 4 x 20 mm Promus DES to pLCx, POBA to OM2  Prox Cx lesion, 90% stenosed. Post intervention with a 4.0 x 20 mm Promus Premier stent, postdilated to 4.5 mm, there is a 0% residual stenosis.  Ost Mrg lesion, 95% stenosed instent-TIMI 2 flow. This was the culprit vessel for the non-STEMI. Post intervention with a 2.5 x 10 Angiosculpt scoring balloon, there is a 0% residual stenosis. The lesion was previously treated with a stent (unknown type) in 2014 when the patient was in [place].  Normal LVEDP. No aortic valve gradient. Continue dual antiplatelet therapy for at least a year without interruption. Continue aggressive secondary prevention including weight reduction, regular exercise, healthy diet and lipid-lowering therapy.  Carotid US 06/08/15 R 1-39%, L 40-59% >> FU 1 year  Myoview 12/14 Overall Impression: Intermediate risk stress nuclear study . There is evidence of a previous inferiolateral MI. There is no ischemia. The LV function is mildlly depressed and the LV is moderately enlarged. There is hypokinesis of the inferiolateral wall.  LV Ejection Fraction: 45%. LV Wall Motion: The LV function is mildlly depressed and the LV is moderately enlarged. There is hypokinesis of the inferiolateral wall.    ASSESSMENT:     1. NSTEMI (non-ST elevated myocardial infarction) ([place])   2. Coronary artery disease involving native coronary artery of native heart without angina pectoris   3. Hypertensive heart disease without heart failure   4. Hyperlipidemia   5. Bilateral carotid artery disease ([place])   6. Tobacco abuse     PLAN:     In order of problems listed above:  1. S/p NSTEMI - Recent admit with NSTEMI tx with DES to LCx and POBA of ISR in OM2.  We discussed the importance of dual antiplatelet  therapy. He is doing well without angina.  -  Continue ASA, Brilinta, high dose statin, ARB, beta-blocker   -  Refer to Cardiac Rehab.  -  Cost of Brilinta may be prohibitive. We  will check on assistance.  2. CAD - No recurrent chest pain. Mild to mod non-obstructive residual disease in LAD and RCA to be tx medically.  Continue ASA, Brilinta, statin, beta-blocker, angiotensin receptor blocker.    3. HTN - Controlled.  4. HL - He was previously on Lipitor 40. Given recent MI, I have recommended that he remain on high-dose statin. Increase Lipitor to 80 mg daily. Check lipids and LFTs in 6 weeks.  5. Carotid disease - FU duplex in 1/18 planned.  6. Tobacco abuse - He has quit smoking.     Medication Adjustments/Labs and Tests Ordered: Current medicines are reviewed at length with the patient today.  Concerns regarding medicines are outlined above.  Medication changes, Labs and Tests ordered today are outlined in the Patient Instructions noted below. Patient Instructions  Medication Instructions:  INCREASE ATORVASTATIN TO 80 MG ONCE DAILY= 2 OF THE 40 MG TABLETS ONCE DAILY Labwork: Your physician recommends that you return for lab work in: 6 WEEKS= DO NOT EAT PRIOR TO LAB WORK Follow-Up: Your physician recommends that you schedule a follow-up appointment in: WITH DR ROSS IN 2 [place]    Signed, Richardson Dopp, PA-C  11/04/2015 10:29 AM    [facility] [address], [place]  [address] Phone: [phone]; Fax: [phone]

## 2015-11-04 ENCOUNTER — Ambulatory Visit (INDEPENDENT_AMBULATORY_CARE_PROVIDER_SITE_OTHER): Payer: Medicare Other | Admitting: Physician Assistant

## 2015-11-04 ENCOUNTER — Encounter: Payer: Self-pay | Admitting: Physician Assistant

## 2015-11-04 VITALS — BP 122/78 | HR 60 | Ht 71.0 in | Wt 229.8 lb

## 2015-11-04 DIAGNOSIS — I6523 Occlusion and stenosis of bilateral carotid arteries: Secondary | ICD-10-CM

## 2015-11-04 DIAGNOSIS — E785 Hyperlipidemia, unspecified: Secondary | ICD-10-CM | POA: Diagnosis not present

## 2015-11-04 DIAGNOSIS — Z720 Tobacco use: Secondary | ICD-10-CM

## 2015-11-04 DIAGNOSIS — I119 Hypertensive heart disease without heart failure: Secondary | ICD-10-CM

## 2015-11-04 DIAGNOSIS — I2510 Atherosclerotic heart disease of native coronary artery without angina pectoris: Secondary | ICD-10-CM

## 2015-11-04 DIAGNOSIS — I214 Non-ST elevation (NSTEMI) myocardial infarction: Secondary | ICD-10-CM

## 2015-11-04 DIAGNOSIS — I739 Peripheral vascular disease, unspecified: Secondary | ICD-10-CM

## 2015-11-04 DIAGNOSIS — I779 Disorder of arteries and arterioles, unspecified: Secondary | ICD-10-CM

## 2015-11-04 MED ORDER — ATORVASTATIN CALCIUM 80 MG PO TABS: 80.0000 mg | ORAL_TABLET | Freq: Every day | ORAL | Status: DC

## 2015-11-04 MED ORDER — TICAGRELOR 90 MG PO TABS
90.0000 mg | ORAL_TABLET | Freq: Two times a day (BID) | ORAL | Status: DC
Start: 2015-11-04 — End: 2015-12-30

## 2015-11-04 NOTE — Patient Instructions (Addendum)
Medication Instructions:  INCREASE ATORVASTATIN TO 80 MG ONCE DAILY= 2 OF THE 40 MG TABLETS ONCE DAILY Labwork: Your physician recommends that you return for lab work in: 6 WEEKS= DO NOT EAT PRIOR TO LAB WORK Follow-Up: Your physician recommends that you schedule a follow-up appointment in: WITH DR ROSS IN 2 [place]

## 2015-12-16 ENCOUNTER — Other Ambulatory Visit: Payer: Medicare Other

## 2015-12-24 ENCOUNTER — Telehealth: Payer: Self-pay | Admitting: *Deleted

## 2015-12-24 ENCOUNTER — Other Ambulatory Visit: Payer: Medicare Other | Admitting: *Deleted

## 2015-12-24 DIAGNOSIS — E785 Hyperlipidemia, unspecified: Secondary | ICD-10-CM | POA: Diagnosis not present

## 2015-12-24 LAB — LIPID PANEL
Cholesterol: 125 mg/dL (ref 125–200)
HDL: 39 mg/dL — ABNORMAL LOW (ref >=40)
LDL Cholesterol: 66 mg/dL (ref <130)
Total CHOL/HDL Ratio: 3.2 Ratio (ref <=5.0)
Triglycerides: 99 mg/dL (ref <150)
VLDL: 20 mg/dL (ref <30)

## 2015-12-24 LAB — HEPATIC FUNCTION PANEL
ALK PHOS: 69 U/L (ref 40–115)
ALT: 32 U/L (ref 9–46)
AST: 21 U/L (ref 10–35)
Albumin: 3.9 g/dL (ref 3.6–5.1)
BILIRUBIN DIRECT: 0.2 mg/dL (ref <=0.2)
BILIRUBIN TOTAL: 0.7 mg/dL (ref 0.2–1.2)
Indirect Bilirubin: 0.5 mg/dL (ref 0.2–1.2)
Total Protein: 7 g/dL (ref 6.1–8.1)

## 2015-12-24 NOTE — Telephone Encounter (Signed)
Pt notified of lab results by phone with verbal understanding.  

## 2015-12-28 ENCOUNTER — Inpatient Hospital Stay (HOSPITAL_COMMUNITY): Admission: RE | Admit: 2015-12-28 | Payer: Medicare Other | Source: Ambulatory Visit

## 2015-12-29 ENCOUNTER — Telehealth: Payer: Self-pay | Admitting: Internal Medicine

## 2015-12-29 NOTE — Telephone Encounter (Signed)
New Message  Pt c/o medication issue:  1. Name of Medication: Brilinta   2. How are you currently taking this medication (dosage and times per day)? '90mg'$    3. Are you having a reaction (difficulty breathing--STAT)? Pt states he is winded; nose bleeds   4. What is your medication issue? Pt states he was prescribed Brilinta and since taking the med it has caused him to be winded and having nose bleeds.pt is concerned and would like a call back from RN to discuss

## 2015-12-30 MED ORDER — CLOPIDOGREL BISULFATE 75 MG PO TABS: 75.0000 mg | ORAL_TABLET | Freq: Every day | ORAL | 11 refills | Status: DC

## 2015-12-30 NOTE — Telephone Encounter (Signed)
Patient reports he has had continuous mild SOB since discharge from the hospital in May. He has also had 2 nosebleeds (the last one 2 weeks ago). He states he did not have these symptoms prior to hospitalization. He blames his symptoms on Brilinta. He denies any other symptoms. Per Richardson Dopp, confirmed with patient he has taken Plavix before and did not have any issues with it (he took it for 6 months after MI in [place]). Instructed patient to switch Brilinta to Plavix with first dose of Plavix being tomorrow. Patient understands to keep OV with Dr. Harrington Challenger in a couple weeks and to call if symptoms do not improve. He was grateful for assistance.

## 2015-12-30 NOTE — Telephone Encounter (Signed)
Agree. DC Brilinta (Ticagrelor)  Start Clopidogrel 75 mg QD FU with Dr. [name] as planned Richardson Dopp, PA-C   12/30/2015 1:04 PM

## 2016-01-03 ENCOUNTER — Ambulatory Visit (HOSPITAL_COMMUNITY): Payer: Medicare Other

## 2016-01-05 ENCOUNTER — Ambulatory Visit (HOSPITAL_COMMUNITY): Payer: Medicare Other

## 2016-01-07 ENCOUNTER — Ambulatory Visit (HOSPITAL_COMMUNITY): Payer: Medicare Other

## 2016-01-10 ENCOUNTER — Ambulatory Visit (HOSPITAL_COMMUNITY): Payer: Medicare Other

## 2016-01-12 ENCOUNTER — Ambulatory Visit (HOSPITAL_COMMUNITY): Payer: Medicare Other

## 2016-01-12 ENCOUNTER — Telehealth (HOSPITAL_COMMUNITY): Payer: Self-pay | Admitting: *Deleted

## 2016-01-12 DIAGNOSIS — Z87891 Personal history of nicotine dependence: Secondary | ICD-10-CM | POA: Diagnosis not present

## 2016-01-12 DIAGNOSIS — Z794 Long term (current) use of insulin: Secondary | ICD-10-CM | POA: Diagnosis not present

## 2016-01-12 DIAGNOSIS — I2510 Atherosclerotic heart disease of native coronary artery without angina pectoris: Secondary | ICD-10-CM | POA: Diagnosis not present

## 2016-01-12 DIAGNOSIS — I252 Old myocardial infarction: Secondary | ICD-10-CM | POA: Diagnosis not present

## 2016-01-12 DIAGNOSIS — I10 Essential (primary) hypertension: Secondary | ICD-10-CM | POA: Diagnosis not present

## 2016-01-12 DIAGNOSIS — E119 Type 2 diabetes mellitus without complications: Secondary | ICD-10-CM | POA: Diagnosis not present

## 2016-01-12 DIAGNOSIS — E7800 Pure hypercholesterolemia, unspecified: Secondary | ICD-10-CM | POA: Diagnosis not present

## 2016-01-12 NOTE — Telephone Encounter (Signed)
Pt called to inform cardiac rehab staff that he would like to cancel his appt for orientation on tomorrow. Pt stated that he is having Bp problems and he will be seeing the doctor on Monday.  Asked it pt wanted to reschedule.  Pt indicated no, he will call back to reschedule when ready. Cherre Huger, BSN

## 2016-01-13 ENCOUNTER — Ambulatory Visit (HOSPITAL_COMMUNITY): Payer: Medicare Other

## 2016-01-14 ENCOUNTER — Ambulatory Visit (HOSPITAL_COMMUNITY): Payer: Medicare Other

## 2016-01-17 ENCOUNTER — Ambulatory Visit (INDEPENDENT_AMBULATORY_CARE_PROVIDER_SITE_OTHER): Payer: Medicare Other | Admitting: Internal Medicine

## 2016-01-17 ENCOUNTER — Encounter: Payer: Self-pay | Admitting: Internal Medicine

## 2016-01-17 ENCOUNTER — Ambulatory Visit (HOSPITAL_COMMUNITY): Payer: Medicare Other

## 2016-01-17 VITALS — BP 128/76 | HR 64 | Ht 71.0 in | Wt 237.4 lb

## 2016-01-17 DIAGNOSIS — I10 Essential (primary) hypertension: Secondary | ICD-10-CM | POA: Diagnosis not present

## 2016-01-17 DIAGNOSIS — I25709 Atherosclerosis of coronary artery bypass graft(s), unspecified, with unspecified angina pectoris: Secondary | ICD-10-CM

## 2016-01-17 DIAGNOSIS — I6523 Occlusion and stenosis of bilateral carotid arteries: Secondary | ICD-10-CM

## 2016-01-17 NOTE — Progress Notes (Signed)
Cardiology Office Note   Date:  01/17/2016   ID:  Randy [name], DOB 03/06/1946, MRN [id number]  PCP:  Gerrit Heck, MD  Cardiologist:   [name], MD   F/U of CAD     History of Present Illness: Randy [name] is a 70 y.o. male with a history of Randy [name] is a 70 y.o. male with a hx of CAD s/p PCI in setting of MI while in [place] in 2012, carotid artery disease s/p R CEA, HTN, HL, tobacco abuse.  Last seen by Dr. Harrington Challenger 3/17.  Admitted 5/24-5/26 with non-STEMI. Cardiac catheterization demonstrated 90% proximal LCx stenosis treated with a Promus DES and 95% OM1 in-stent restenosis treated with POBA (angioscope scoring balloon). Echocardiogram demonstrated mild LVH, EF 55% and possible inferolateral hypokinesis, moderate diastolic dysfunction and trace AI.  This PCI course was uneventful. He was discharged on aspirin, Brilinta, Lipitor 80, Avapro, Toprol.    He returned to the emergency room 10/29/15 with chest discomfort. Cardiac markers are negative. Chest x-ray was unremarkable. Chest symptoms are felt to be atypical for ischemia and he was DC to home.  Returns for FU.  Here alone. He is doing well.  The patient denies any chest pain, significant dyspnea, syncope, orthopnea, PND, edema.  Since seen he called in with SOB   Told to switch to Plavix  He has not done this yet BP at home 110s to 120s  At night 100s  Has not taken meds yed    Has not resumed smoking   No CP  Breagthing can be short at times        Outpatient Medications Prior to Visit  Medication Sig Dispense Refill  . aspirin EC 81 MG tablet Take 81 mg by mouth daily.    [name] atorvastatin (LIPITOR) 80 MG tablet Take 1 tablet (80 mg total) by mouth daily. 30 tablet 11  . irbesartan-hydrochlorothiazide (AVALIDE) 150-12.5 MG tablet Take 0.5 tablets by mouth daily.  3  . metoprolol succinate (TOPROL XL) 25 MG 24 hr tablet Take 0.5 tablets (12.5 mg total) by mouth daily. 45 tablet 3  . nitroGLYCERIN  (NITROSTAT) 0.4 MG SL tablet Place 1 tablet (0.4 mg total) under the tongue every 5 (five) minutes x 3 doses as needed for chest pain. 30 tablet 12  . clopidogrel (PLAVIX) 75 MG tablet Take 1 tablet (75 mg total) by mouth daily. (Patient not taking: Reported on 01/17/2016) 30 tablet 11   No facility-administered medications prior to visit.      Allergies:   Review of patient's allergies indicates no known allergies.   Past Medical History:  Diagnosis Date  . Carotid arterial disease ([place])    a. S/p R CEA;  b. 05/2015 Carotid U/S: RICA 5-36%, LICA 64-40%.  . Coronary artery disease    a. 2012 MI in [place] w/ PCI  //  b. 04/2013 MV: inflat scar, no ischemia.  //  c.   NSTEMI 5/17 >> Promus DES to LCx and POBA to OM1 (ISR).   [name] History of cardiac catheterization 10/21/15   a. NSTEMI 5/17: oLAD 40, pLCx 90 (Promus DES), OM2 90 ISR (POBA), oRCA 25   . History of echocardiogram    a. Mild LVH, EF 50-55%, possible inferolateral HK, grade 2 diastolic dysfunction, trivial AI, MAC  . History of nuclear stress test 04/2013   a. Myoview 12/14: Intermediate risk, prior inferolateral MI, no ischemia, EF 45%  . Hyperlipidemia   . Hypertensive heart disease   .  Morbid obesity ([place])   . Tobacco abuse     Past Surgical History:  Procedure Laterality Date  . CARDIAC CATHETERIZATION    . CARDIAC CATHETERIZATION N/A 10/21/2015   Procedure: Left Heart Cath and Coronary Angiography;  Surgeon: [name], MD;  Location: [facility];  Service: Cardiovascular;  Laterality: N/A;  . CARDIAC CATHETERIZATION N/A 10/21/2015   Procedure: Coronary Stent Intervention;  Surgeon: [name], MD;  Location: [facility];  Service: Cardiovascular;  Laterality: N/A;  . CARDIAC CATHETERIZATION N/A 10/21/2015   Procedure: Coronary Balloon Angioplasty;  Surgeon: [name], MD;  Location: [facility];  Service: Cardiovascular;  Laterality: N/A;  . CAROTID ENDARTERECTOMY    .  CORONARY ANGIOPLASTY    . CORONARY STENT PLACEMENT       Social History:  The patient  reports that he quit smoking about 2 months ago. His smoking use included Cigarettes. He started smoking about 43 years ago. He has a 30.00 pack-year smoking history. He has never used smokeless tobacco. He reports that he does not drink alcohol or use drugs.   Family History:  The patient's family history includes Hypertension in his father; Other in his mother.    ROS:  Please see the history of present illness. All other systems are reviewed and  Negative to the above problem except as noted.    PHYSICAL EXAM: VS:  BP 128/76   Pulse 64   Ht '5\' 11"'$  (1.803 m)   Wt 237 lb 6.4 oz (107.7 kg)   SpO2 97%   BMI 33.11 kg/m   GEN: Obese 70 yo, in no acute distress  HEENT: normal  Neck: no JVD, carotid bruits, or masses Cardiac: RRR; no murmurs, rubs, or gallops,no edema  Respiratory:  clear to auscultation bilaterally, normal work of breathing GI: soft, nontender, nondistended, + BS  No hepatomegaly  MS: no deformity Moving all extremities   Skin: warm and dry, no rash Neuro:  Strength and sensation are intact Psych: euthymic mood, full affect   EKG:  EKG is not ordered today.   Lipid Panel    Component Value Date/Time   CHOL 125 12/24/2015 0809   TRIG 99 12/24/2015 0809   HDL 39 (L) 12/24/2015 0809   CHOLHDL 3.2 12/24/2015 0809   VLDL 20 12/24/2015 0809   LDLCALC 66 12/24/2015 0809      Wt Readings from Last 3 Encounters:  01/17/16 237 lb 6.4 oz (107.7 kg)  11/04/15 229 lb 12.8 oz (104.2 kg)  10/29/15 225 lb (102.1 kg)      ASSESSMENT AND PLAN:  1  CAD  Doing OK  Will swithc to Plavix  2  HTN  BP is low at night  I told him to hold avalide  Check BP  If goes up would recomm Avapro 75 He will call  3.  HL Continue lipitir  4  Tobacco  Congratulated on quitting   F?U in  Jan 2018     SignedDorris Carnes, MD  01/17/2016 8:37 AM    [address], [place],   [address] Phone: [phone]; Fax: [phone]

## 2016-01-17 NOTE — Patient Instructions (Addendum)
Hold Avalide and call in few days or email Dr. Harrington Challenger through [facility] your blood pressures and how you are feeling.  Switch to Plavix (75 mg once a day)when you finish current supply of Brilinta.  Continue all other medications.  Your physician wants you to follow-up in: January, 2018 with Dr. Harrington Challenger.  You will receive a reminder letter in the mail two months in advance. If you don't receive a letter, please call our office to schedule the follow-up appointment.

## 2016-01-19 ENCOUNTER — Ambulatory Visit (HOSPITAL_COMMUNITY): Payer: Medicare Other

## 2016-01-20 ENCOUNTER — Telehealth: Payer: Self-pay | Admitting: Internal Medicine

## 2016-01-20 NOTE — Telephone Encounter (Signed)
Pt states at time of office visit 01/17/16 when avalide was discontinued there was some discussion about changing to clopidogrel from Brilinta because of shortness of breath.  Pt states since he is feeling better, less shortness of breath, off avalide he is going to continue Brilinta for another month before making a decision about changing to clopidogrel.  Pt requesting Dr Harrington Challenger review BP readings and make recommendations.  Pt advised I will forward to Dr Harrington Challenger for review.

## 2016-01-20 NOTE — Telephone Encounter (Signed)
New Message:    Pt said Dr Harrington Challenger took him off one of his blood pressure medicine on Monday. It is going up since he stopped the medicine.Please call to advise.

## 2016-01-20 NOTE — Telephone Encounter (Signed)
Pt states Dr Harrington Challenger discontinued avalide.  Pt calling with BP readings the last 4 days.  01/17/16 137/72 58 01/18/16  141/84 HR 72  AM 144/87   HR 65   PM 01/19/16   144/84  HR 64 AM 139/80 HR  64 PM 01/20/16 143/80 HR 63 AM  Pt calling to report BP readings since stopping avalide. Pt states feels better and more energy, less shortness of breath.

## 2016-01-20 NOTE — Telephone Encounter (Signed)
BP is OK  WOuld continue to follow Agree wth plans as noted.

## 2016-01-21 ENCOUNTER — Ambulatory Visit (HOSPITAL_COMMUNITY): Payer: Medicare Other

## 2016-01-21 ENCOUNTER — Other Ambulatory Visit: Payer: Self-pay | Admitting: *Deleted

## 2016-01-21 MED ORDER — TICAGRELOR 90 MG PO TABS: 90.0000 mg | ORAL_TABLET | Freq: Two times a day (BID) | ORAL | 3 refills | Status: DC

## 2016-01-21 NOTE — Progress Notes (Signed)
Refilled Brilinta to pharmacy.  Pt is going to continue x 1 more month since SOB has improved.   He had been planning to switch to Plavix but wants to try another month on Brilinta.  See phone note 8/24.

## 2016-01-21 NOTE — Telephone Encounter (Addendum)
LMTCB  Pt returning call.  Advised of Dr. Alan Ripper recommendation to continue to monitor BP.  Discussed range of BP and when to take his BP.  Advised to take BP about 1 hr after taking his medication in the AM.  Advised to notify us if he continues to have the SOB.  He verbalizes understanding.

## 2016-01-24 ENCOUNTER — Ambulatory Visit (HOSPITAL_COMMUNITY): Payer: Medicare Other

## 2016-01-26 ENCOUNTER — Ambulatory Visit (HOSPITAL_COMMUNITY): Payer: Medicare Other

## 2016-01-28 ENCOUNTER — Ambulatory Visit (HOSPITAL_COMMUNITY): Payer: Medicare Other

## 2016-02-01 ENCOUNTER — Telehealth: Payer: Self-pay | Admitting: Internal Medicine

## 2016-02-01 ENCOUNTER — Encounter: Payer: Self-pay | Admitting: Internal Medicine

## 2016-02-01 NOTE — Telephone Encounter (Signed)
Spoke with pt and advised him that pt advice request has been sent over to Dr. Harrington Challenger and we are waiting for her to review this.  Pt email is as follows:  Dr. Harrington Challenger  During my last visit I was taking 1/2 pill Metroprolol 25 mg and 1/2 Irbesartin-HCTZ 150-12.5 mg. We eliminated the Irbesartin . My readings for the last two weeks have been averaging 155/85. But over the holidays it really got high readings. 188/107 and 186/106 the last couple of days morning and evening. I did not do anything but rest those 2 days and readings still high so I went back to Irbesartin the last few days and readings have came down to 157/86 as of this morning. Do we need to change the BP Pill or continue on Irbesartin? I am currently cutting Irbesartin in half but they do make a 75 mg without HCTZ. Or should we go to another BP pill that is better. As I said before the Irbesartin I was taking I felt a little weak but we can try again . Please advise  Randy [name]   Pt denies any changes to meds or diet that would cause BP to rise.  Pt had slight HA when BP elevated but denies any other symptoms.  On Sunday pt started taking a 1/2 tab of the Irbesartan/HCTZ that was previously d/c'ed.  Advised pt that I will send this message to Dr. Harrington Challenger for review and advisement.  Pt verbalized understanding.

## 2016-02-01 NOTE — Telephone Encounter (Signed)
Follow up   Pt verbalized that he sent an email today and he is waiting for rn to call him back

## 2016-02-02 ENCOUNTER — Ambulatory Visit (HOSPITAL_COMMUNITY): Payer: Medicare Other

## 2016-02-02 ENCOUNTER — Encounter: Payer: Self-pay | Admitting: Internal Medicine

## 2016-02-02 MED ORDER — IRBESARTAN 75 MG PO TABS: 75.0000 mg | ORAL_TABLET | Freq: Every day | ORAL | 3 refills | Status: DC

## 2016-02-02 NOTE — Telephone Encounter (Signed)
FOLLOW UP EMAIL FROM PHONE CALL THIS AM:  From: Gayleen Orem  Sent: 02/02/2016 10:55 AM  To: [facility] Triage  Subject: Visit Follow-Up Question               Randy [name],  Just spoken to you and wanted to follow up on the back pain or pressure on my back above the left shoulder blade when my blood pressure was hi. I also had it when I was on vacation and walking one time but went away. I don't know if that is from Hi BP or not you may want to ask Dr. Harrington Challenger per our discussion  [place],  [name]

## 2016-02-02 NOTE — Telephone Encounter (Signed)
FROM LAST OV NOTE:  ASSESSMENT AND PLAN:  1  CAD  Doing OK  Will swithc to Plavix  2  HTN  BP is low at night  I told him to hold avalide  Check BP  If goes up would recomm Avapro 75 He will call    I called patient to discuss.    2 days ago he restarted his Irbesartin-HCTZ  --1/2 tablet every morning (His usual dose).  BPs yesterday 137/83, 126/75.   This AM 140/85 (before meds). We discussed that Dr. Harrington Challenger was planning to prescribe Avapro 75 if BP went back up.    Patient is agreeable to start Avapro 75 mg EVERY AM.      He will call back/email with further updates.   Wants Dr. Harrington Challenger to know that he had an ache between his shoulder blades only when his BP readings were high (per email).  This did not go away with position changes.  No other symptoms but did discuss SOB, still has it, not as bad.  Wants to continue Brilinta for now even though he was told it is ok to switch to Plavix.   He is going to [place] on Oct 9 for couple weeks, able to change this if necessary.  Pt is aware that I am forwarding to Dr. Harrington Challenger and will call him if there are any further recommendations.

## 2016-02-03 ENCOUNTER — Other Ambulatory Visit: Payer: Self-pay

## 2016-02-03 NOTE — Telephone Encounter (Signed)
Spoke to pt on phone. He is taking avapro 75  BP today is 135/75 No back symptoms   I have asked him to follow BP and write back email in 2 wks with readings.

## 2016-02-04 ENCOUNTER — Ambulatory Visit (HOSPITAL_COMMUNITY): Payer: Medicare Other

## 2016-02-07 ENCOUNTER — Ambulatory Visit (HOSPITAL_COMMUNITY): Payer: Medicare Other

## 2016-02-09 ENCOUNTER — Ambulatory Visit (HOSPITAL_COMMUNITY): Payer: Medicare Other

## 2016-02-11 ENCOUNTER — Ambulatory Visit (HOSPITAL_COMMUNITY): Payer: Medicare Other

## 2016-02-14 ENCOUNTER — Ambulatory Visit (HOSPITAL_COMMUNITY): Payer: Medicare Other

## 2016-02-16 ENCOUNTER — Ambulatory Visit (HOSPITAL_COMMUNITY): Payer: Medicare Other

## 2016-02-18 ENCOUNTER — Ambulatory Visit (HOSPITAL_COMMUNITY): Payer: Medicare Other

## 2016-02-21 ENCOUNTER — Ambulatory Visit (HOSPITAL_COMMUNITY): Payer: Medicare Other

## 2016-02-23 ENCOUNTER — Ambulatory Visit (HOSPITAL_COMMUNITY): Payer: Medicare Other

## 2016-02-25 ENCOUNTER — Ambulatory Visit (HOSPITAL_COMMUNITY): Payer: Medicare Other

## 2016-02-28 ENCOUNTER — Ambulatory Visit (HOSPITAL_COMMUNITY): Payer: Medicare Other

## 2016-03-01 ENCOUNTER — Ambulatory Visit (HOSPITAL_COMMUNITY): Payer: Medicare Other

## 2016-03-03 ENCOUNTER — Ambulatory Visit (HOSPITAL_COMMUNITY): Payer: Medicare Other

## 2016-03-06 ENCOUNTER — Ambulatory Visit (HOSPITAL_COMMUNITY): Payer: Medicare Other

## 2016-03-08 ENCOUNTER — Ambulatory Visit (HOSPITAL_COMMUNITY): Payer: Medicare Other

## 2016-03-10 ENCOUNTER — Ambulatory Visit (HOSPITAL_COMMUNITY): Payer: Medicare Other

## 2016-03-13 ENCOUNTER — Ambulatory Visit (HOSPITAL_COMMUNITY): Payer: Medicare Other

## 2016-03-15 ENCOUNTER — Ambulatory Visit (HOSPITAL_COMMUNITY): Payer: Medicare Other

## 2016-03-17 ENCOUNTER — Ambulatory Visit (HOSPITAL_COMMUNITY): Payer: Medicare Other

## 2016-03-20 ENCOUNTER — Ambulatory Visit (HOSPITAL_COMMUNITY): Payer: Medicare Other

## 2016-03-22 ENCOUNTER — Ambulatory Visit (HOSPITAL_COMMUNITY): Payer: Medicare Other

## 2016-03-24 ENCOUNTER — Ambulatory Visit (HOSPITAL_COMMUNITY): Payer: Medicare Other

## 2016-03-27 ENCOUNTER — Ambulatory Visit (HOSPITAL_COMMUNITY): Payer: Medicare Other

## 2016-03-29 ENCOUNTER — Ambulatory Visit (HOSPITAL_COMMUNITY): Payer: Medicare Other

## 2016-03-31 ENCOUNTER — Ambulatory Visit (HOSPITAL_COMMUNITY): Payer: Medicare Other

## 2016-04-03 ENCOUNTER — Ambulatory Visit (HOSPITAL_COMMUNITY): Payer: Medicare Other

## 2016-04-05 ENCOUNTER — Ambulatory Visit (HOSPITAL_COMMUNITY): Payer: Medicare Other

## 2016-04-07 ENCOUNTER — Ambulatory Visit (HOSPITAL_COMMUNITY): Payer: Medicare Other

## 2016-04-10 ENCOUNTER — Ambulatory Visit (HOSPITAL_COMMUNITY): Payer: Medicare Other

## 2016-04-12 ENCOUNTER — Ambulatory Visit (HOSPITAL_COMMUNITY): Payer: Medicare Other

## 2016-04-14 ENCOUNTER — Ambulatory Visit (HOSPITAL_COMMUNITY): Payer: Medicare Other

## 2016-04-17 ENCOUNTER — Ambulatory Visit (HOSPITAL_COMMUNITY): Payer: Medicare Other

## 2016-04-19 ENCOUNTER — Encounter: Payer: Self-pay | Admitting: Internal Medicine

## 2016-04-19 ENCOUNTER — Ambulatory Visit (HOSPITAL_COMMUNITY): Payer: Medicare Other

## 2016-04-21 ENCOUNTER — Ambulatory Visit (HOSPITAL_COMMUNITY): Payer: Medicare Other

## 2016-04-24 ENCOUNTER — Ambulatory Visit (HOSPITAL_COMMUNITY): Payer: Medicare Other

## 2016-04-26 ENCOUNTER — Ambulatory Visit (HOSPITAL_COMMUNITY): Payer: Medicare Other

## 2016-04-28 ENCOUNTER — Ambulatory Visit (HOSPITAL_COMMUNITY): Payer: Medicare Other

## 2016-05-01 ENCOUNTER — Ambulatory Visit (HOSPITAL_COMMUNITY): Payer: Medicare Other

## 2016-05-03 ENCOUNTER — Ambulatory Visit (HOSPITAL_COMMUNITY): Payer: Medicare Other

## 2016-05-05 ENCOUNTER — Ambulatory Visit (HOSPITAL_COMMUNITY): Payer: Medicare Other

## 2016-05-16 ENCOUNTER — Encounter: Payer: Self-pay | Admitting: Internal Medicine

## 2016-05-16 ENCOUNTER — Telehealth: Payer: Self-pay | Admitting: *Deleted

## 2016-05-16 NOTE — Telephone Encounter (Signed)
Patient c/o elevated blood pressure today. Patient confirmed that he is taking avapro 75 mg daily and toprol xl 12.5 mg daily. Patient said he uses an omron digital BP cuff that he purchased 6-7 months ago and that he has checked his monitor for accuracy at his PCP's office. Patient states " my BP was 188/108 this morning after waking up and sitting for 10 min." Patient said he slept well last night and admits to snoring. Per patient, he checked his BP 3 times today and it ranged from 867-672/ with diastolic all over 094. Patient encouraged to check his blood pressure at the same time each day, using the same cuff and same arm each time. Patient said he was in [place] for 2 weeks and has been back in the [place] for 1 week. Patient admitted to eating foods high in sodium while in [place]. Patient said that prior to going to [place] (November 2017) that he did check his BP daily his BP ranged around 122/77 -145/89. No c/o chest pain, dizziness, n/v or edema. C/O sob with activity that has worsened recently. Patient said he thinks its related to [place]. Patient said that he did discuss this with his doctor previously but he chose to stay on brilinta. Patient also c/o of slight intermittent headaches that he's had all week rated 6-7/10. No c/o blurred vision. Patient said he did not take anything for the headache. Patient said he has been able to do all of his regular activities. Patient encouraged to take tylenol for the headache.Patient c/o indigestion last night with coughing and said that some stomach content came up that caused him some discomfort. Patient said he ate chicken and noodles for dinner last night and did not have any spicy foods. Patient advised to monitor the BP as suggested above, advised to contact his PCP about his headaches and that message would be sent to his doctor for advise on the worsened sob with taking brilinta. Patient verbalized understanding of plan.

## 2016-05-16 NOTE — Telephone Encounter (Signed)
Message discussed with Dr. Tamala Julian and suggested that patient start a BP log measuring BP's at the same time daily, using the same cuff and same arm and taking it at at least 2 hours after BP medications and not upon waking up in the mornings. Also suggested that patient does not take any NSAID's and patient is to contact his PCP about his headaches. Patient informed of the above information and  also made aware that Dr. Harrington Challenger would see this message upon her return to the office r/e his sob and brilinta. Patient informed and verbalized understanding of plan.

## 2016-05-18 NOTE — Progress Notes (Signed)
Patient ID: Randy [name]                 DOB: Aug 01, 1945                      MRN: [id number]     HPI: Randy [name] is a 70 y.o. male referred by Dr. Harrington Challenger to HTN clinic.  PMH positive for hx of CAD s/p PCI in setting of MI in 2012, non-STEMI 2017,carotid artery disease s/p R CEA, HTN, HL, tobacco abuse. Cardiac catheterization demonstrated 90% proximal LCx stenosis.  Echocardiogram demonstrated mild LVH, EF 55% and moderate diastolic dysfunction.   Patient presents today at hypertension clinic for 1st visit. Reports multiple changes in blood pressure medication in the last 6 months. Patient travels to [place] every 3 months for work and sees doctor in [place] as needed for cardiac management and blood pressure management.  Also comes to OV with 4 different home devices for BP monitoring.    No dizziness, headaches or swelling reported. Only complaint today is SOB related to Brilinta and already addressed by Dr Harrington Challenger.  Main home monitor was compared with clinic manual readying and patient to stop using ALL other devices.  Current HTN meds: ibesartan '75mg'$  daily, metoprolol succinate 12.'5mg'$  daily  Previously tried: ibesartan-HTCZ 150-12.'5mg'$  (1 tablet daily, then 1/2 tablet daily) changed by Dr Harrington Challenger > 3 months ago.  BP goal: < 130/80  Social History:  The patient  reports that he quit smoking about 2 months ago. His smoking use included Cigarettes. He started smoking about 43 years ago. He has a 30.00 pack-year smoking history. He has never used smokeless tobacco. He reports that he does not drink alcohol or use drugs.   Family History:  The patient's family history includes Hypertension in his father; Other in his mother.   Diet: Add table salt to food frequently; no salt substitute; 1 cup of regular coffee every day  Exercise: walks a lot when in [place], very minimal exercise while at home  Home BP readings:  30 readings ; average 138/86 with range 115-185/68-108  Wt Readings from Last 3  Encounters:  01/17/16 237 lb 6.4 oz (107.7 kg)  11/04/15 229 lb 12.8 oz (104.2 kg)  10/29/15 225 lb (102.1 kg)   BP Readings from Last 3 Encounters:  01/17/16 128/76  11/04/15 122/78  10/29/15 114/61   Pulse Readings from Last 3 Encounters:  01/17/16 64  11/04/15 60  10/29/15 (!) 50    Past Medical History:  Diagnosis Date  . Carotid arterial disease ([place])    a. S/p R CEA;  b. 05/2015 Carotid U/S: RICA 7-82%, LICA 42-35%.  . Coronary artery disease    a. 2012 MI in [place] w/ PCI  //  b. 04/2013 MV: inflat scar, no ischemia.  //  c.   NSTEMI 5/17 >> Promus DES to LCx and POBA to OM1 (ISR).   [name] History of cardiac catheterization 10/21/15   a. NSTEMI 5/17: oLAD 40, pLCx 90 (Promus DES), OM2 90 ISR (POBA), oRCA 25   . History of echocardiogram    a. Mild LVH, EF 50-55%, possible inferolateral HK, grade 2 diastolic dysfunction, trivial AI, MAC  . History of nuclear stress test 04/2013   a. Myoview 12/14: Intermediate risk, prior inferolateral MI, no ischemia, EF 45%  . Hyperlipidemia   . Hypertensive heart disease   . Morbid obesity ([place])   . Tobacco abuse     Current Outpatient Prescriptions on  File Prior to Visit  Medication Sig Dispense Refill  . aspirin EC 81 MG tablet Take 81 mg by mouth daily.    [name] atorvastatin (LIPITOR) 80 MG tablet Take 1 tablet (80 mg total) by mouth daily. 30 tablet 11  . clopidogrel (PLAVIX) 75 MG tablet Take 1 tablet (75 mg total) by mouth daily. (Patient not taking: Reported on 01/17/2016) 30 tablet 11  . irbesartan (AVAPRO) 75 MG tablet Take 1 tablet (75 mg total) by mouth daily. 90 tablet 3  . metoprolol succinate (TOPROL XL) 25 MG 24 hr tablet Take 0.5 tablets (12.5 mg total) by mouth daily. 45 tablet 3  . nitroGLYCERIN (NITROSTAT) 0.4 MG SL tablet Place 1 tablet (0.4 mg total) under the tongue every 5 (five) minutes x 3 doses as needed for chest pain. 30 tablet 12  . ticagrelor (BRILINTA) 90 MG TABS tablet Take 1 tablet (90 mg total) by mouth 2 (two)  times daily. 60 tablet 3   No current facility-administered medications on file prior to visit.     No Known Allergies   Assessment/Plan:  1. Hypertension - BP today of 155/88 (~3hr after taking all morning medication) remains above goal of <130/80 for a patient with significant cardiac history.  Readings at home extremely variable with  range 115-185/68-108. Home device was compared with manual reading in office with less than 59mHg difference.  Will plan to increase irbesartan '75mg'$  to '150mg'$  daily (will use irbesartan '75mg'$  x 2 tablet for now), and  continue metoprolol succinate 12.'5mg'$  daily.  Beta-blocker dose stable at this time with HR consistently ~60s.   Follow up with clinic and BMET in 2 weeks.  Patient instructed to call clinic if BP drops < 100/60 or sign/symptoms of ADR noted.     Joselito Fieldhouse Rodriguez-Guzman PharmD, BSan [id number]/27/2017 1:51 PM

## 2016-05-19 NOTE — Telephone Encounter (Signed)
Spoke with patient on yesterday r/e email he sent with his BP readings. Patient scheduled to be seen in the HTN clinic on 05/24/16 next week.

## 2016-05-23 ENCOUNTER — Encounter: Payer: Self-pay | Admitting: *Deleted

## 2016-05-24 ENCOUNTER — Ambulatory Visit (INDEPENDENT_AMBULATORY_CARE_PROVIDER_SITE_OTHER): Payer: Medicare Other | Admitting: Pharmacist

## 2016-05-24 VITALS — BP 155/88 | HR 69

## 2016-05-24 DIAGNOSIS — I10 Essential (primary) hypertension: Secondary | ICD-10-CM

## 2016-05-24 DIAGNOSIS — I6523 Occlusion and stenosis of bilateral carotid arteries: Secondary | ICD-10-CM

## 2016-05-24 NOTE — Patient Instructions (Signed)
Blood pressure today was 155/88 pulse 69  1. Increase Irbesartan to 150 mg daily (okay to take 2 tablets of 75 mg )   2. Continue metoprolol 12.'5mg'$  daily  3. Keep records of home blood pressure reading and bring to next appointment  4. Work on decreasing salt from diet  5. Call Hypertension clinic if Blood pressure below 100/60 or side effect develop  6. Follow up appointment and blood work in 2 weeks   Hypertension Hypertension is another name for high blood pressure. High blood pressure forces your heart to work harder to pump blood. A blood pressure reading has two numbers, which includes a higher number over a lower number (example: 110/72). Follow these instructions at home:  Have your blood pressure rechecked by your doctor.  Only take medicine as told by your doctor. Follow the directions carefully. The medicine does not work as well if you skip doses. Skipping doses also puts you at risk for problems.  Do not smoke.  Monitor your blood pressure at home as told by your doctor. Contact a doctor if:  You think you are having a reaction to the medicine you are taking.  You have repeat headaches or feel dizzy.  You have puffiness (swelling) in your ankles.  You have trouble with your vision. Get help right away if:  You get a very bad headache and are confused.  You feel weak, numb, or faint.  You get chest or belly (abdominal) pain.  You throw up (vomit).  You cannot breathe very well. This information is not intended to replace advice given to you by your health care provider. Make sure you discuss any questions you have with your health care provider. Document Released: 11/01/2007 Document Revised: 10/21/2015 Document Reviewed: 03/07/2013 Elsevier Interactive Patient Education  2017 [employer].

## 2016-06-07 ENCOUNTER — Other Ambulatory Visit: Payer: Medicare Other

## 2016-06-09 ENCOUNTER — Encounter: Payer: Self-pay | Admitting: Internal Medicine

## 2016-06-09 ENCOUNTER — Ambulatory Visit: Payer: Medicare Other

## 2016-06-09 ENCOUNTER — Other Ambulatory Visit: Payer: Self-pay | Admitting: *Deleted

## 2016-06-09 ENCOUNTER — Ambulatory Visit (INDEPENDENT_AMBULATORY_CARE_PROVIDER_SITE_OTHER): Payer: Medicare Other | Admitting: Internal Medicine

## 2016-06-09 ENCOUNTER — Other Ambulatory Visit: Payer: Medicare Other | Admitting: *Deleted

## 2016-06-09 VITALS — BP 164/81 | HR 64 | Ht 71.0 in | Wt 240.0 lb

## 2016-06-09 DIAGNOSIS — Z23 Encounter for immunization: Secondary | ICD-10-CM | POA: Diagnosis not present

## 2016-06-09 DIAGNOSIS — E7800 Pure hypercholesterolemia, unspecified: Secondary | ICD-10-CM | POA: Diagnosis not present

## 2016-06-09 DIAGNOSIS — I110 Hypertensive heart disease with heart failure: Secondary | ICD-10-CM | POA: Diagnosis not present

## 2016-06-09 DIAGNOSIS — I214 Non-ST elevation (NSTEMI) myocardial infarction: Secondary | ICD-10-CM

## 2016-06-09 MED ORDER — ATORVASTATIN CALCIUM 40 MG PO TABS: 40.0000 mg | ORAL_TABLET | Freq: Every day | ORAL | 3 refills | Status: DC

## 2016-06-09 MED ORDER — IRBESARTAN 150 MG PO TABS: 150.0000 mg | ORAL_TABLET | Freq: Every day | ORAL | 3 refills | Status: DC

## 2016-06-09 NOTE — Addendum Note (Signed)
Addended by: Eulis Foster on: 06/09/2016 02:01 PM   Modules accepted: Orders

## 2016-06-09 NOTE — Patient Instructions (Addendum)
Your physician recommends that you continue on your current medications as directed. Please refer to the Current Medication list given to you today.     

## 2016-06-09 NOTE — Progress Notes (Deleted)
Patient ID: Randy [name]                 DOB: Oct 25, 1945                      MRN: [id number]     HPI: Randy [name] is a 71 y.o. male patient of Dr. Harrington Challenger with PMH below who presents today for hypertension follow up. His cardiac catheterization demonstrated 90% proximal LCx stenosis.  Echocardiogram demonstrated mild LVH, EF 55% and moderate diastolic dysfunction. At his most recent visit with Raquel Rodriguez-Guzman, PharmD he seemed to have tenuous home pressures with ranges 115-185/68-108 and his irbesartan was increased to 164m daily.    Cardiac Hx: CAD s/p PCI in setting of MI in 2012, non-STEMI 2017,carotid artery disease s/p R CEA, HTN, HL, tobacco abuse  Current HTN meds:  Irbesartan 1519mdaily Metoprolol succinate 12.92m1maily  Previously tried: ibesartan-HTCZ 150-12.92mg66m tablet daily, then 1/2 tablet daily) changed by Dr RossHarrington Challenger months ago  BP goal: <130/80  Social History: The patient reports that he quit smoking about 2 months ago. His smoking use included Cigarettes. He started smoking about 43 years ago. He has a 30.00 pack-year smoking history. He has never used smokeless tobacco. He reports that he does not drink alcohol or use drugs.  Family History:The patient's family history includes Hypertension in his father; Other in his mother.  Diet: Add table salt to food frequently; no salt substitute; 1 cup of regular coffee every day  Exercise: walks a lot when in [place] minimal exercise while at home  Home BP readings:   Wt Readings from Last 3 Encounters:  01/17/16 237 lb 6.4 oz (107.7 kg)  11/04/15 229 lb 12.8 oz (104.2 kg)  10/29/15 225 lb (102.1 kg)   BP Readings from Last 3 Encounters:  05/24/16 (!) 155/88  01/17/16 128/76  11/04/15 122/78   Pulse Readings from Last 3 Encounters:  05/24/16 69  01/17/16 64  11/04/15 60    Renal function: CrCl cannot be calculated (Patient's most recent lab result is older than the maximum 21 days  allowed.).  Past Medical History:  Diagnosis Date  . Carotid arterial disease (HCC)[place] a. S/p R CEA;  b. 05/2015 Carotid U/S: RICA 1-399-48%CA 40-501-65% Coronary artery disease    [place]. [place]  //  b. 04/2013 MV: inflat scar, no ischemia.  //  c.   NSTEMI 5/17 >> Promus DES to LCx and POBA to OM1 (ISR).   . HiMarland Kitchentory of cardiac catheterization 10/21/15   a. NSTEMI 5/17: oLAD 40, pLCx 90 (Promus DES), OM2 90 ISR (POBA), oRCA 25   . History of echocardiogram    a. Mild LVH, EF 50-55%, possible inferolateral HK, grade 2 diastolic dysfunction, trivial AI, MAC  . History of nuclear stress test 04/2013   a. Myoview 12/14: Intermediate risk, prior inferolateral MI, no ischemia, EF 45%  . Hyperlipidemia   . Hypertensive heart disease   . Morbid obesity (HCC)[place]. Tobacco abuse     Current Outpatient Prescriptions on File Prior to Visit  Medication Sig Dispense Refill  . aspirin EC 81 MG tablet Take 81 mg by mouth daily.    . atMarland Kitchenrvastatin (LIPITOR) 80 MG tablet Take 1 tablet (80 mg total) by mouth daily. 30 tablet 11  . clopidogrel (PLAVIX) 75 MG tablet Take 1 tablet (75 mg total) by mouth daily. (Patient  not taking: Reported on 01/17/2016) 30 tablet 11  . irbesartan (AVAPRO) 75 MG tablet Take 1 tablet (75 mg total) by mouth daily. 90 tablet 3  . metoprolol succinate (TOPROL XL) 25 MG 24 hr tablet Take 0.5 tablets (12.5 mg total) by mouth daily. 45 tablet 3  . nitroGLYCERIN (NITROSTAT) 0.4 MG SL tablet Place 1 tablet (0.4 mg total) under the tongue every 5 (five) minutes x 3 doses as needed for chest pain. 30 tablet 12  . ticagrelor (BRILINTA) 90 MG TABS tablet Take 1 tablet (90 mg total) by mouth 2 (two) times daily. 60 tablet 3   No current facility-administered medications on file prior to visit.     No Known Allergies  There were no vitals taken for this visit.   Assessment/Plan: Hypertension:    Thank you, Lelan Pons. Patterson Hammersmith, [facility]    06/09/2016 8:03 AM

## 2016-06-09 NOTE — Progress Notes (Signed)
Cardiology Office Note   Date:  06/09/2016   ID:  Randy [name], DOB 07/23/1945, MRN [id number]  PCP:  Gerrit Heck, MD  Cardiologist:   [name], MD   F/U of CAD     History of Present Illness: Randy [name] is a 71 y.o. male with a history of CAD  S/p PCI in 2012 in [place]  Pt went on to have an NSTEMI in May 2017   Cardiac catheterization demonstrated 90% proximal LCx stenosis treated with a Promus DES and 95% OM1 in-stent restenosis treated with POBA (angioscope scoring balloon). Echocardiogram demonstrated mild LVH, EF 55% and possible inferolateral hypokinesis, moderate diastolic dysfunction and trace AI. This PCI course was uneventful. He was discharged on aspirin, Brilinta, Lipitor 80, Avapro, Toprol.   He returned to the emergency room 10/29/15 with chest discomfort. Cardiac markers are negative. Chest x-ray was unremarkable. Chest symptoms are felt to be atypical for ischemia and he was DC to home.  I saw hiim in AUgust 2017   He had switched to plavix prior   Pt also had a history of HTN, HL  Tobacco abouse, CV dz s/p R CEA    BP wa up  Iresartan was increasied  He is still taking Brilinta  Still has some SOB  Says he never felt boost after  Intervention done last spring  Denies CP   Current Meds  Medication Sig  . aspirin EC 81 MG tablet Take 81 mg by mouth daily.  [name] atorvastatin (LIPITOR) 80 MG tablet Take 1 tablet (80 mg total) by mouth daily. (Patient taking differently: Take 40 mg by mouth daily. )  . irbesartan (AVAPRO) 75 MG tablet Take 1 tablet (75 mg total) by mouth daily. (Patient taking differently: Take 150 mg by mouth daily. )  . metoprolol succinate (TOPROL XL) 25 MG 24 hr tablet Take 0.5 tablets (12.5 mg total) by mouth daily.  . nitroGLYCERIN (NITROSTAT) 0.4 MG SL tablet Place 1 tablet (0.4 mg total) under the tongue every 5 (five) minutes x 3 doses as needed for chest pain.  . ticagrelor (BRILINTA) 90 MG TABS tablet Take 1 tablet (90 mg  total) by mouth 2 (two) times daily.     Allergies:   Patient has no known allergies.   Past Medical History:  Diagnosis Date  . Carotid arterial disease ([place])    a. S/p R CEA;  b. 05/2015 Carotid U/S: RICA 5-88%, LICA 50-27%.  . Coronary artery disease    a. 2012 MI in [place] w/ PCI  //  b. 04/2013 MV: inflat scar, no ischemia.  //  c.   NSTEMI 5/17 >> Promus DES to LCx and POBA to OM1 (ISR).   [name] History of cardiac catheterization 10/21/15   a. NSTEMI 5/17: oLAD 40, pLCx 90 (Promus DES), OM2 90 ISR (POBA), oRCA 25   . History of echocardiogram    a. Mild LVH, EF 50-55%, possible inferolateral HK, grade 2 diastolic dysfunction, trivial AI, MAC  . History of nuclear stress test 04/2013   a. Myoview 12/14: Intermediate risk, prior inferolateral MI, no ischemia, EF 45%  . Hyperlipidemia   . Hypertensive heart disease   . Morbid obesity ([place])   . Tobacco abuse     Past Surgical History:  Procedure Laterality Date  . CARDIAC CATHETERIZATION    . CARDIAC CATHETERIZATION N/A 10/21/2015   Procedure: Left Heart Cath and Coronary Angiography;  Surgeon: [name], MD;  Location: [facility];  Service: Cardiovascular;  Laterality: N/A;  . CARDIAC CATHETERIZATION N/A 10/21/2015   Procedure: Coronary Stent Intervention;  Surgeon: [name], MD;  Location: [facility];  Service: Cardiovascular;  Laterality: N/A;  . CARDIAC CATHETERIZATION N/A 10/21/2015   Procedure: Coronary Balloon Angioplasty;  Surgeon: [name], MD;  Location: [facility];  Service: Cardiovascular;  Laterality: N/A;  . CAROTID ENDARTERECTOMY    . CORONARY ANGIOPLASTY    . CORONARY STENT PLACEMENT       Social History:  The patient  reports that he quit smoking about 7 months ago. His smoking use included Cigarettes. He started smoking about 44 years ago. He has a 30.00 pack-year smoking history. He has never used smokeless tobacco. He reports that he does not drink alcohol or  use drugs.   Family History:  The patient's family history includes Hypertension in his father; Other in his mother.    ROS:  Please see the history of present illness. All other systems are reviewed and  Negative to the above problem except as noted.    PHYSICAL EXAM: VS:  BP (!) 164/81   Pulse 64   Ht _0  (1.803 m)   Wt 240 lb (108.9 kg)   BMI 33.47 kg/m   GEN: Obese 70 yo , in no acute distress  HEENT: normal  Neck: no JVD, carotid bruits, or masses Cardiac: RRR; no murmurs, rubs, or gallops,no edema  Respiratory:  clear to auscultation bilaterally, normal work of breathing GI: soft, nontender, nondistended, + BS  No hepatomegaly  MS: no deformity Moving all extremities   Skin: warm and dry, no rash Neuro:  Strength and sensation are intact Psych: euthymic mood, full affect   EKG:  EKG is not  ordered today.   Lipid Panel    Component Value Date/Time   CHOL 125 12/24/2015 0809   TRIG 99 12/24/2015 0809   HDL 39 (L) 12/24/2015 0809   CHOLHDL 3.2 12/24/2015 0809   VLDL 20 12/24/2015 0809   LDLCALC 66 12/24/2015 0809      Wt Readings from Last 3 Encounters:  06/09/16 240 lb (108.9 kg)  01/17/16 237 lb 6.4 oz (107.7 kg)  11/04/15 229 lb 12.8 oz (104.2 kg)      ASSESSMENT AND PLAN:  1  CAD   I am not convinced of active angina  SOB has been chronic   WIll see if he can get Efflient covered  Would try before switching to plavix    2  HL   Keep on statin   Good control of LDL   3   BP is up some  I would not change now     Current medicines are reviewed at length with the patient today.  The patient does not have concerns regarding medicines.  Signed, [name], MD  06/09/2016 2:40 PM    [facility] [place], [place], [place]  [address] Phone: [phone]; Fax: [phone]

## 2016-06-10 LAB — BASIC METABOLIC PANEL
BUN/Creatinine Ratio: 13 (ref 10–24)
BUN: 12 mg/dL (ref 8–27)
CO2: 23 mmol/L (ref 18–29)
Calcium: 9.2 mg/dL (ref 8.6–10.2)
Chloride: 96 mmol/L (ref 96–106)
Creatinine, Ser: 0.93 mg/dL (ref 0.76–1.27)
GFR calc Af Amer: 96 mL/min/{1.73_m2} (ref >59)
GFR calc non Af Amer: 83 mL/min/{1.73_m2} (ref >59)
GLUCOSE: 118 mg/dL — AB (ref 65–99)
Potassium: 4.7 mmol/L (ref 3.5–5.2)
Sodium: 137 mmol/L (ref 134–144)

## 2016-06-19 ENCOUNTER — Encounter: Payer: Self-pay | Admitting: Internal Medicine

## 2016-06-19 MED ORDER — PRASUGREL HCL 10 MG PO TABS: 10.0000 mg | ORAL_TABLET | Freq: Every day | ORAL | 6 refills | Status: DC

## 2016-06-20 LAB — HM DIABETES EYE EXAM

## 2016-06-23 ENCOUNTER — Encounter: Payer: Self-pay | Admitting: Internal Medicine

## 2016-06-23 ENCOUNTER — Telehealth: Payer: Self-pay | Admitting: Internal Medicine

## 2016-06-23 MED ORDER — IRBESARTAN 75 MG PO TABS: 75.0000 mg | ORAL_TABLET | Freq: Every day | ORAL | Status: DC

## 2016-06-23 NOTE — Telephone Encounter (Signed)
Patient is concerned about his BP 98/64. Patient is asymptomatic. Patient thinks his BP is too low due to his increased dose of irbesartan 150 mg. Will consult Dr. Harrington Challenger for advisement.  Dr. Harrington Challenger advised patient to go back on irbesartan 75 mg, keep taking record of BPs, and come in to see her in a couple of weeks. Instructed patient to bring his BP machine with him to office visit. Scheduled on 07/14/16. Patient verbalized understanding. Will update patient's medication list.

## 2016-06-23 NOTE — Telephone Encounter (Signed)
Follow Up:   Pt said he sent e mail earlier today,still have not heard anything. Pt's blood pressure is low,it is 98/64.

## 2016-06-27 ENCOUNTER — Other Ambulatory Visit: Payer: Self-pay | Admitting: Internal Medicine

## 2016-06-27 DIAGNOSIS — I6523 Occlusion and stenosis of bilateral carotid arteries: Secondary | ICD-10-CM

## 2016-07-12 ENCOUNTER — Ambulatory Visit (HOSPITAL_COMMUNITY)
Admission: RE | Admit: 2016-07-12 | Discharge: 2016-07-12 | Disposition: A | Discharge status: Home / Self Care | Payer: Medicare Other | Source: Ambulatory Visit | Attending: Cardiology | Admitting: Cardiology

## 2016-07-12 DIAGNOSIS — I6523 Occlusion and stenosis of bilateral carotid arteries: Secondary | ICD-10-CM | POA: Diagnosis not present

## 2016-07-12 NOTE — Progress Notes (Signed)
Cardiology Office Note   Date:  07/14/2016   ID:  BODI PALMERI, DOB [birth year], MRN [id number]  PCP:  Gerrit Heck, MD  Cardiologist:   [name], MD   F/U of CAD   History of Present Illness: Randy [name] is a 71 y.o. male with a history ofCAD  S/p PCI in 2012 in [place]  Pt went on to have an NSTEMI in May 2017   Cardiac catheterization demonstrated 90% proximal LCx stenosis treated with a Promus DES and 95% OM1 in-stent restenosis treated with POBA (angioscope scoring balloon). Echocardiogram demonstrated mild LVH, EF 55% and possible inferolateral hypokinesis, moderate diastolic dysfunction and trace AI. This PCI course was uneventful. He was discharged on aspirin, Brilinta, Lipitor 80, Avapro, Toprol.   He returned to the emergency room 10/29/15 with chest discomfort. Cardiac markers are negative. Chest x-ray was unremarkable. Chest symptoms are felt to be atypical for ischemia and he was DC to home.  He had switched to plavix prior   Pt also had a history of HTN, HL  Tobacco abouse, CV dz s/p R CEA     I ssw him in JAn 2018  Avapro adjusted  Up  He stopped brilinta and switched to effient  After that bp at home was lower  He felt bad  Went back to 25 Irbisartan.   He is feelig good  BP at home has been 110s to 150  Average 120  Denies CP  Feeling better  Breating is better     Current Meds  Medication Sig  . aspirin EC 81 MG tablet Take 81 mg by mouth daily.  [name] atorvastatin (LIPITOR) 40 MG tablet Take 1 tablet (40 mg total) by mouth daily.  . irbesartan (AVAPRO) 75 MG tablet Take 1 tablet (75 mg total) by mouth daily.  . metoprolol succinate (TOPROL XL) 25 MG 24 hr tablet Take 0.5 tablets (12.5 mg total) by mouth daily.  . nitroGLYCERIN (NITROSTAT) 0.4 MG SL tablet Place 1 tablet (0.4 mg total) under the tongue every 5 (five) minutes x 3 doses as needed for chest pain.  . prasugrel (EFFIENT) 10 MG TABS tablet Take 1 tablet (10 mg total) by mouth daily.      Allergies:   Patient has no known allergies.   Past Medical History:  Diagnosis Date  . Carotid arterial disease ([place])    a. S/p R CEA;  b. 05/2015 Carotid U/S: RICA 6-25%, LICA 63-89%.  . Coronary artery disease    a. 2012 MI in [place] w/ PCI  //  b. 04/2013 MV: inflat scar, no ischemia.  //  c.   NSTEMI 5/17 >> Promus DES to LCx and POBA to OM1 (ISR).   [name] History of cardiac catheterization 10/21/15   a. NSTEMI 5/17: oLAD 40, pLCx 90 (Promus DES), OM2 90 ISR (POBA), oRCA 25   . History of echocardiogram    a. Mild LVH, EF 50-55%, possible inferolateral HK, grade 2 diastolic dysfunction, trivial AI, MAC  . History of nuclear stress test 04/2013   a. Myoview 12/14: Intermediate risk, prior inferolateral MI, no ischemia, EF 45%  . Hyperlipidemia   . Hypertensive heart disease   . Morbid obesity ([place])   . Tobacco abuse     Past Surgical History:  Procedure Laterality Date  . CARDIAC CATHETERIZATION    . CARDIAC CATHETERIZATION N/A 10/21/2015   Procedure: Left Heart Cath and Coronary Angiography;  Surgeon: [name], MD;  Location: [facility];  Service: Cardiovascular;  Laterality: N/A;  . CARDIAC CATHETERIZATION N/A 10/21/2015   Procedure: Coronary Stent Intervention;  Surgeon: [name], MD;  Location: [facility];  Service: Cardiovascular;  Laterality: N/A;  . CARDIAC CATHETERIZATION N/A 10/21/2015   Procedure: Coronary Balloon Angioplasty;  Surgeon: [name], MD;  Location: [facility];  Service: Cardiovascular;  Laterality: N/A;  . CAROTID ENDARTERECTOMY    . CORONARY ANGIOPLASTY    . CORONARY STENT PLACEMENT       Social History:  The patient  reports that he quit smoking about 8 months ago. His smoking use included Cigarettes. He started smoking about 44 years ago. He has a 30.00 pack-year smoking history. He has never used smokeless tobacco. He reports that he does not drink alcohol or use drugs.   Family History:  The  patient's family history includes Hypertension in his father; Other in his mother.    ROS:  Please see the history of present illness. All other systems are reviewed and  Negative to the above problem except as noted.    PHYSICAL EXAM: VS:  BP 138/70   Pulse 65   Ht _0  (1.803 m)   Wt 241 lb 3.2 oz (109.4 kg)   SpO2 97%   BMI 33.64 kg/m   GEN:  Obese 70 yo in no acute distress  HEENT: normal  Neck: no JVD, carotid bruits, or masses Cardiac: RRR; no murmurs, rubs, or gallops,no edema  Respiratory:  clear to auscultation bilaterally, normal work of breathing GI: soft, nontender, nondistended, + BS  No hepatomegaly  MS: no deformity Moving all extremities   Skin: warm and dry, no rash Neuro:  Strength and sensation are intact Psych: euthymic mood, full affect   EKG:  EKG is not prdered today.   Lipid Panel    Component Value Date/Time   CHOL 125 12/24/2015 0809   TRIG 99 12/24/2015 0809   HDL 39 (L) 12/24/2015 0809   CHOLHDL 3.2 12/24/2015 0809   VLDL 20 12/24/2015 0809   LDLCALC 66 12/24/2015 0809      Wt Readings from Last 3 Encounters:  07/14/16 241 lb 3.2 oz (109.4 kg)  06/09/16 240 lb (108.9 kg)  01/17/16 237 lb 6.4 oz (107.7 kg)      ASSESSMENT AND PLAN: 1  HTN  BP ok on current dose of medicine  I would continue to follow  May need to go to higher dose  I would recomm that he lose wt    2  CAD  No anginal symptoms  Continue current meds including Effient F/U end of may  3  HL  Keep on same regimen     4  Obesity  COunselled on diet and exercise  IF wt came down, would help BP    F/U May 2018    Current medicines are reviewed at length with the patient today.  The patient does not have concerns regarding medicines.  Signed, [name], MD  07/14/2016 2:16 PM    [facility] [place], [place], [place]  [address] Phone: [phone]; Fax: [phone]

## 2016-07-14 ENCOUNTER — Encounter: Payer: Self-pay | Admitting: Internal Medicine

## 2016-07-14 ENCOUNTER — Ambulatory Visit (INDEPENDENT_AMBULATORY_CARE_PROVIDER_SITE_OTHER): Payer: Medicare Other | Admitting: Internal Medicine

## 2016-07-14 VITALS — BP 138/70 | HR 65 | Ht 71.0 in | Wt 241.2 lb

## 2016-07-14 DIAGNOSIS — I6523 Occlusion and stenosis of bilateral carotid arteries: Secondary | ICD-10-CM | POA: Diagnosis not present

## 2016-07-14 DIAGNOSIS — I10 Essential (primary) hypertension: Secondary | ICD-10-CM | POA: Diagnosis not present

## 2016-07-14 DIAGNOSIS — I2510 Atherosclerotic heart disease of native coronary artery without angina pectoris: Secondary | ICD-10-CM

## 2016-07-14 DIAGNOSIS — E785 Hyperlipidemia, unspecified: Secondary | ICD-10-CM

## 2016-07-14 NOTE — Patient Instructions (Signed)
Your physician recommends that you continue on your current medications as directed. Please refer to the Current Medication list given to you today.  Your physician recommends that you schedule a follow-up appointment in: May, 2018 with Dr. Harrington Challenger.

## 2016-08-04 ENCOUNTER — Ambulatory Visit: Payer: Medicare Other | Admitting: Internal Medicine

## 2016-08-16 ENCOUNTER — Other Ambulatory Visit: Payer: Self-pay | Admitting: *Deleted

## 2016-08-16 MED ORDER — PRASUGREL HCL 10 MG PO TABS: 10.0000 mg | ORAL_TABLET | Freq: Every day | ORAL | 3 refills | Status: DC

## 2016-08-25 ENCOUNTER — Ambulatory Visit: Payer: Medicare Other | Admitting: Internal Medicine

## 2016-09-03 ENCOUNTER — Other Ambulatory Visit: Payer: Self-pay | Admitting: Internal Medicine

## 2016-10-04 ENCOUNTER — Encounter: Payer: Self-pay | Admitting: Internal Medicine

## 2016-10-19 NOTE — Progress Notes (Signed)
Cardiology Office Note   Date:  10/20/2016   ID:  COLBE Randy [name], DOB Jul 26, 1945, MRN [id number]  PCP:  Leighton Ruff, MD  Cardiologist:   [name], MD   F/U of CAD    History of Present Illness: Randy [name] is a 71 y.o. male with a history of CAD    S/p PCI in 2012 in [place]  Pt went on to have an NSTEMI in May 2017   Cardiac catheterization demonstrated 90% proximal LCx stenosis treated with a Promus DES and 95% OM1 in-stent restenosis treated with POBA (angioscope scoring balloon). Echocardiogram demonstrated mild LVH, EF 55% and possible inferolateral hypokinesis, moderate diastolic dysfunction and trace AI. This PCI course was uneventful. He was discharged on aspirin, Brilinta, Lipitor 80, Avapro, Toprol.   He returned to the emergency room 10/29/15 with chest discomfort. Cardiac markers are negative. Chest x-ray was unremarkable. Chest symptoms are felt to be atypical for ischemia and he was DC to home.  I saw him back in Jan 2018 and then Feb  Brings in BP log  Overall pretty good  110s to 130s  Occaional 150    He is breathing good  No CP  No dizziness  Plans to go to [place] soon   Current Meds  Medication Sig  . aspirin EC 81 MG tablet Take 81 mg by mouth daily.  [name] atorvastatin (LIPITOR) 40 MG tablet Take 1 tablet (40 mg total) by mouth daily.  . irbesartan (AVAPRO) 150 MG tablet Take 150 mg by mouth as directed.  . irbesartan (AVAPRO) 75 MG tablet Take 1 tablet (75 mg total) by mouth daily.  . metoprolol succinate (TOPROL-XL) 25 MG 24 hr tablet TAKE 1/2 TABLETS BY MOUTH DAILY  . nitroGLYCERIN (NITROSTAT) 0.4 MG SL tablet Place 1 tablet (0.4 mg total) under the tongue every 5 (five) minutes x 3 doses as needed for chest pain.  . prasugrel (EFFIENT) 10 MG TABS tablet Take 1 tablet (10 mg total) by mouth daily.     Allergies:   Patient has no known allergies.   Past Medical History:  Diagnosis Date  . Carotid arterial disease ([place])    a. S/p R CEA;  b.  05/2015 Carotid U/S: RICA 3-61%, LICA 44-31%.  . Coronary artery disease    a. 2012 MI in [place] w/ PCI  //  b. 04/2013 MV: inflat scar, no ischemia.  //  c.   NSTEMI 5/17 >> Promus DES to LCx and POBA to OM1 (ISR).   [name] History of cardiac catheterization 10/21/15   a. NSTEMI 5/17: oLAD 40, pLCx 90 (Promus DES), OM2 90 ISR (POBA), oRCA 25   . History of echocardiogram    a. Mild LVH, EF 50-55%, possible inferolateral HK, grade 2 diastolic dysfunction, trivial AI, MAC  . History of nuclear stress test 04/2013   a. Myoview 12/14: Intermediate risk, prior inferolateral MI, no ischemia, EF 45%  . Hyperlipidemia   . Hypertensive heart disease   . Morbid obesity ([place])   . Tobacco abuse     Past Surgical History:  Procedure Laterality Date  . CARDIAC CATHETERIZATION    . CARDIAC CATHETERIZATION N/A 10/21/2015   Procedure: Left Heart Cath and Coronary Angiography;  Surgeon: [name], MD;  Location: [facility];  Service: Cardiovascular;  Laterality: N/A;  . CARDIAC CATHETERIZATION N/A 10/21/2015   Procedure: Coronary Stent Intervention;  Surgeon: [name], MD;  Location: [facility];  Service: Cardiovascular;  Laterality:  N/A;  . CARDIAC CATHETERIZATION N/A 10/21/2015   Procedure: Coronary Balloon Angioplasty;  Surgeon: [name], MD;  Location: [facility];  Service: Cardiovascular;  Laterality: N/A;  . CAROTID ENDARTERECTOMY    . CORONARY ANGIOPLASTY    . CORONARY STENT PLACEMENT       Social History:  The patient  reports that he quit smoking about a year ago. His smoking use included Cigarettes. He started smoking about 44 years ago. He has a 30.00 pack-year smoking history. He has never used smokeless tobacco. He reports that he does not drink alcohol or use drugs.   Family History:  The patient's family history includes Hypertension in his father; Other in his mother.    ROS:  Please see the history of present illness. All other systems  are reviewed and  Negative to the above problem except as noted.    PHYSICAL EXAM: VS:  BP 124/68   Pulse 67   Ht _0  (1.803 m)   Wt 233 lb 12.8 oz (106.1 kg)   SpO2 97%   BMI 32.61 kg/m   GEN: Well nourished, well developed, in no acute distress  HEENT: normal  Neck: no JVD, carotid bruits, or masses Cardiac: RRR; no murmurs, rubs, or gallops,no edema  Respiratory:  clear to auscultation bilaterally, normal work of breathing GI: soft, nontender, nondistended, + BS  No hepatomegaly  MS: no deformity Moving all extremities   Skin: warm and dry, no rash Neuro:  Strength and sensation are intact Psych: euthymic mood, full affect   EKG:  EKG is not ordered today.   Lipid Panel    Component Value Date/Time   CHOL 125 12/24/2015 0809   TRIG 99 12/24/2015 0809   HDL 39 (L) 12/24/2015 0809   CHOLHDL 3.2 12/24/2015 0809   VLDL 20 12/24/2015 0809   LDLCALC 66 12/24/2015 0809      Wt Readings from Last 3 Encounters:  10/20/16 233 lb 12.8 oz (106.1 kg)  07/14/16 241 lb 3.2 oz (109.4 kg)  06/09/16 240 lb (108.9 kg)      ASSESSMENT AND PLAN:  1  CAD No symptoms of angina    I would keep on same regimen  Will review length of DAPT Rx    2  HTN  BP is better  I would keep on same meds    3  HL  Continue statin  Check lipids today    4 Obesity  COunselled on wt loss.  INcreased activity  Will also check CBC and BMET    F/U next winter      Current medicines are reviewed at length with the patient today.  The patient does not have concerns regarding medicines.  Signed, [name], MD  10/20/2016 2:49 PM    [facility], [place], [place]  [address] Phone: [phone]; Fax: [phone]

## 2016-10-20 ENCOUNTER — Ambulatory Visit (INDEPENDENT_AMBULATORY_CARE_PROVIDER_SITE_OTHER): Payer: Medicare Other | Admitting: Internal Medicine

## 2016-10-20 ENCOUNTER — Encounter: Payer: Self-pay | Admitting: Internal Medicine

## 2016-10-20 VITALS — BP 124/68 | HR 67 | Ht 71.0 in | Wt 233.8 lb

## 2016-10-20 DIAGNOSIS — I2510 Atherosclerotic heart disease of native coronary artery without angina pectoris: Secondary | ICD-10-CM

## 2016-10-20 DIAGNOSIS — I10 Essential (primary) hypertension: Secondary | ICD-10-CM

## 2016-10-20 DIAGNOSIS — E785 Hyperlipidemia, unspecified: Secondary | ICD-10-CM | POA: Diagnosis not present

## 2016-10-20 DIAGNOSIS — I6523 Occlusion and stenosis of bilateral carotid arteries: Secondary | ICD-10-CM

## 2016-10-20 NOTE — Patient Instructions (Signed)
Your physician recommends that you continue on your current medications as directed. Please refer to the Current Medication list given to you today.  Your physician recommends that you return for lab work in: today (LIPIDS, BMET, CBC)  Your physician wants you to follow-up in: [place].  You will receive a reminder letter in the mail two months in advance. If you don't receive a letter, please call our office to schedule the follow-up appointment.

## 2016-10-21 LAB — CBC
HEMOGLOBIN: 13.5 g/dL (ref 13.0–17.7)
Hematocrit: 42 % (ref 37.5–51.0)
MCH: 28.1 pg (ref 26.6–33.0)
MCHC: 32.1 g/dL (ref 31.5–35.7)
MCV: 87 fL (ref 79–97)
PLATELETS: 326 10*3/uL (ref 150–379)
RBC: 4.81 x10E6/uL (ref 4.14–5.80)
RDW: 13.1 % (ref 12.3–15.4)
WBC: 12.3 10*3/uL — ABNORMAL HIGH (ref 3.4–10.8)

## 2016-10-21 LAB — BASIC METABOLIC PANEL
BUN / CREAT RATIO: 13 (ref 10–24)
BUN: 11 mg/dL (ref 8–27)
CO2: 24 mmol/L (ref 18–29)
CREATININE: 0.87 mg/dL (ref 0.76–1.27)
Calcium: 9.5 mg/dL (ref 8.6–10.2)
Chloride: 99 mmol/L (ref 96–106)
GFR calc Af Amer: 100 mL/min/{1.73_m2} (ref >59)
GFR calc non Af Amer: 87 mL/min/{1.73_m2} (ref >59)
GLUCOSE: 99 mg/dL (ref 65–99)
Potassium: 5.1 mmol/L (ref 3.5–5.2)
Sodium: 138 mmol/L (ref 134–144)

## 2016-10-21 LAB — LIPID PANEL
CHOLESTEROL TOTAL: 116 mg/dL (ref 100–199)
Chol/HDL Ratio: 3.7 ratio (ref 0.0–5.0)
HDL: 31 mg/dL — ABNORMAL LOW (ref >39)
LDL CALC: 55 mg/dL (ref 0–99)
TRIGLYCERIDES: 149 mg/dL (ref 0–149)
VLDL Cholesterol Cal: 30 mg/dL (ref 5–40)

## 2016-10-24 ENCOUNTER — Encounter: Payer: Self-pay | Admitting: Internal Medicine

## 2016-10-25 ENCOUNTER — Encounter: Payer: Self-pay | Admitting: *Deleted

## 2016-10-30 MED ORDER — CLOPIDOGREL BISULFATE 75 MG PO TABS: 75.0000 mg | ORAL_TABLET | Freq: Every day | ORAL | 3 refills | Status: DC

## 2016-10-30 NOTE — Progress Notes (Signed)
Would keep on Plavix and 81 asa

## 2016-10-30 NOTE — Addendum Note (Signed)
Addended by: Emmaline Life on: 10/30/2016 09:23 AM   Modules accepted: Orders

## 2016-11-08 ENCOUNTER — Other Ambulatory Visit: Payer: Self-pay | Admitting: *Deleted

## 2016-11-08 DIAGNOSIS — R7989 Other specified abnormal findings of blood chemistry: Secondary | ICD-10-CM

## 2016-12-01 ENCOUNTER — Other Ambulatory Visit: Payer: Medicare Other

## 2017-01-03 ENCOUNTER — Encounter: Payer: Self-pay | Admitting: Internal Medicine

## 2017-01-12 ENCOUNTER — Other Ambulatory Visit: Payer: Self-pay | Admitting: Internal Medicine

## 2017-01-23 DIAGNOSIS — E7800 Pure hypercholesterolemia, unspecified: Secondary | ICD-10-CM | POA: Diagnosis not present

## 2017-01-23 DIAGNOSIS — D72829 Elevated white blood cell count, unspecified: Secondary | ICD-10-CM | POA: Diagnosis not present

## 2017-01-23 DIAGNOSIS — I10 Essential (primary) hypertension: Secondary | ICD-10-CM | POA: Diagnosis not present

## 2017-01-23 DIAGNOSIS — Z23 Encounter for immunization: Secondary | ICD-10-CM | POA: Diagnosis not present

## 2017-01-23 DIAGNOSIS — E119 Type 2 diabetes mellitus without complications: Secondary | ICD-10-CM | POA: Diagnosis not present

## 2017-01-23 DIAGNOSIS — Z8679 Personal history of other diseases of the circulatory system: Secondary | ICD-10-CM | POA: Diagnosis not present

## 2017-01-23 DIAGNOSIS — I2510 Atherosclerotic heart disease of native coronary artery without angina pectoris: Secondary | ICD-10-CM | POA: Diagnosis not present

## 2017-01-23 LAB — POCT UA - MICROALBUMIN
ALBUMIN/CREATININE RATIO, URINE, POC: 13
Creatinine, POC: 144 mg/dL
MICROALBUMIN (UR) POC: 1.87 mg/L

## 2017-02-02 DIAGNOSIS — E119 Type 2 diabetes mellitus without complications: Secondary | ICD-10-CM | POA: Diagnosis not present

## 2017-02-02 DIAGNOSIS — R7989 Other specified abnormal findings of blood chemistry: Secondary | ICD-10-CM | POA: Diagnosis not present

## 2017-02-02 DIAGNOSIS — Z7984 Long term (current) use of oral hypoglycemic drugs: Secondary | ICD-10-CM | POA: Diagnosis not present

## 2017-02-20 DIAGNOSIS — E119 Type 2 diabetes mellitus without complications: Secondary | ICD-10-CM | POA: Diagnosis not present

## 2017-02-20 DIAGNOSIS — Z23 Encounter for immunization: Secondary | ICD-10-CM | POA: Diagnosis not present

## 2017-02-20 DIAGNOSIS — Z7984 Long term (current) use of oral hypoglycemic drugs: Secondary | ICD-10-CM | POA: Diagnosis not present

## 2017-02-22 ENCOUNTER — Telehealth: Payer: Self-pay | Admitting: Oncology

## 2017-02-22 ENCOUNTER — Encounter: Payer: Self-pay | Admitting: Oncology

## 2017-02-22 NOTE — Telephone Encounter (Signed)
Appt has been scheduled for the pt to see Dr. Alen Blew on 10/9 at 11am. Pt aware to arrive 30 minutes early. Letter mailed to the pt and faxed to the referring.

## 2017-03-07 ENCOUNTER — Ambulatory Visit (HOSPITAL_BASED_OUTPATIENT_CLINIC_OR_DEPARTMENT_OTHER): Payer: Medicare Other | Admitting: Oncology

## 2017-03-07 ENCOUNTER — Telehealth: Payer: Self-pay | Admitting: Oncology

## 2017-03-07 VITALS — BP 155/76 | HR 74 | Temp 97.4°F | Resp 18 | Ht 71.0 in | Wt 218.3 lb

## 2017-03-07 DIAGNOSIS — D72829 Elevated white blood cell count, unspecified: Secondary | ICD-10-CM | POA: Diagnosis present

## 2017-03-07 NOTE — Telephone Encounter (Signed)
Per 10/10 - no los at checkout

## 2017-03-07 NOTE — Progress Notes (Signed)
Reason for Referral: Leukocytosis.   HPI:  71 year old gentleman currently of [place] where he lived the majority of his life. He also lived and continues to travel to [place] on multiple occasions because of his work. He is currently retired but does travel periodically still. He has also history of coronary disease and hyperlipidemia. He was noted to have leukocytosis dating back to 1999. He has intermittently noted to have white cell count as high as 13,000 which normalizes spontaneously. He was had normal differential and normal CBC otherwise. His most recent CBC in September 2018 show white cell count 12.3, normal hemoglobin and platelet count. His differential was completely normal. He is asymptomatic at this time from this finding. He denied any smoking recently or any known allergies. He does have seasonal allergies at times. He denies any fevers or constitutional symptoms. He remains active and attends to activities of daily living.  He has not reported any headaches, blurry vision, syncope or seizures. He does not report any fevers, chills or sweats. He does not report any cough, wheezing or hemoptysis. He does not report any chest pain, palpitation, orthopnea or leg edema. He does not report any frequency urgency or hesitancy. He does not report any skeletal complaints. Remaining review of system is unremarkable.   Past Medical History:  Diagnosis Date  . Carotid arterial disease ([place])    a. S/p R CEA;  b. 05/2015 Carotid U/S: RICA 8-18%, LICA 29-93%.  . Coronary artery disease    a. 2012 MI in [place] w/ PCI  //  b. 04/2013 MV: inflat scar, no ischemia.  //  c.   NSTEMI 5/17 >> Promus DES to LCx and POBA to OM1 (ISR).   [name] History of cardiac catheterization 10/21/15   a. NSTEMI 5/17: oLAD 40, pLCx 90 (Promus DES), OM2 90 ISR (POBA), oRCA 25   . History of echocardiogram    a. Mild LVH, EF 50-55%, possible inferolateral HK, grade 2 diastolic dysfunction, trivial AI, MAC  . History of  nuclear stress test 04/2013   a. Myoview 12/14: Intermediate risk, prior inferolateral MI, no ischemia, EF 45%  . Hyperlipidemia   . Hypertensive heart disease   . Morbid obesity ([place])   . Tobacco abuse   :  Past Surgical History:  Procedure Laterality Date  . CARDIAC CATHETERIZATION    . CARDIAC CATHETERIZATION N/A 10/21/2015   Procedure: Left Heart Cath and Coronary Angiography;  Surgeon: [name], MD;  Location: [facility];  Service: Cardiovascular;  Laterality: N/A;  . CARDIAC CATHETERIZATION N/A 10/21/2015   Procedure: Coronary Stent Intervention;  Surgeon: [name], MD;  Location: [facility];  Service: Cardiovascular;  Laterality: N/A;  . CARDIAC CATHETERIZATION N/A 10/21/2015   Procedure: Coronary Balloon Angioplasty;  Surgeon: [name], MD;  Location: [facility];  Service: Cardiovascular;  Laterality: N/A;  . CAROTID ENDARTERECTOMY    . CORONARY ANGIOPLASTY    . CORONARY STENT PLACEMENT    :   Current Outpatient Prescriptions:  .  aspirin EC 81 MG tablet, Take 81 mg by mouth daily., Disp: , Rfl:  .  atorvastatin (LIPITOR) 40 MG tablet, Take 1 tablet (40 mg total) by mouth daily., Disp: 90 tablet, Rfl: 3 .  clopidogrel (PLAVIX) 75 MG tablet, Take 1 tablet (75 mg total) by mouth daily., Disp: 90 tablet, Rfl: 3 .  metFORMIN (GLUCOPHAGE) 500 MG tablet, TAKE 1/2 TABLET TWICE A DAY ORALLY, Disp: , Rfl: 0 .  metoprolol  succinate (TOPROL-XL) 25 MG 24 hr tablet, TAKE 1/2 TABLETS BY MOUTH DAILY, Disp: 45 tablet, Rfl: 2:  No Known Allergies:  Family History  Problem Relation Age of Onset  . Other Mother        died @ 49 - complications following surgery.  . Hypertension Father        died in [place] @ 75  :  Social History   Social History  . Marital status: Married    Spouse name: ardith  . Number of children: 7  . Years of education: college   Occupational History  . retired    Social History Main Topics  . Smoking  status: Former Smoker    Packs/day: 0.75    Years: 40.00    Types: Cigarettes    Start date: 06/18/1972    Quit date: 10/24/2015  . Smokeless tobacco: Never Used  . Alcohol use No  . Drug use: No  . Sexual activity: Not on file   Other Topics Concern  . Not on file   Social History Narrative   Lives in [place] with wife.  Does not routinely exercise.  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  :  Pertinent items are noted in HPI.  Exam: Blood pressure (!) 155/76, pulse 74, temperature (!) 97.4 F (36.3 C), temperature source Oral, resp. rate 18, height _0  (1.803 m), weight 218 lb 4.8 oz (99 kg), SpO2 100 %.  ECOG 0  General appearance: alert and cooperative appeared without distress. Throat: No oral thrush or ulcers.  Neck: no adenopathy or neck masses. Resp: clear to auscultation bilaterally Chest wall: no tenderness Cardio: regular rate and rhythm, S1, S2 normal, no murmur, click, rub or gallop GI: soft, non-tender; bowel sounds normal; no masses,  no organomegaly Extremities: extremities normal, atraumatic, no cyanosis or edema Pulses: 2+ and symmetric Skin: Skin color, texture, turgor normal. No rashes or lesions  CBC    Component Value Date/Time   WBC 12.3 (H) 10/20/2016 1508   WBC 13.8 (H) 10/29/2015 1145   RBC 4.81 10/20/2016 1508   RBC 5.03 10/29/2015 1145   HGB 13.5 10/20/2016 1508   HCT 42.0 10/20/2016 1508   PLT 326 10/20/2016 1508   MCV 87 10/20/2016 1508   MCH 28.1 10/20/2016 1508   MCH 30.2 10/29/2015 1145   MCHC 32.1 10/20/2016 1508   MCHC 33.6 10/29/2015 1145   RDW 13.1 10/20/2016 1508     Chemistry      Component Value Date/Time   NA 138 10/20/2016 1508   K 5.1 10/20/2016 1508   CL 99 10/20/2016 1508   CO2 24 10/20/2016 1508   BUN 11 10/20/2016 1508   CREATININE 0.87 10/20/2016 1508      Component Value Date/Time   CALCIUM 9.5 10/20/2016 1508   ALKPHOS 69 12/24/2015 0809   AST 21 12/24/2015 0809    ALT 32 12/24/2015 0809   BILITOT 0.7 12/24/2015 0809       Assessment and Plan:   71 year old gentleman with the following issues:  1. Leukocytosis: His white cell count fluctuating as high as 12.3 and the normal range since at least 1999. We have documented CBCs in 2017 with similar pattern. His differential was normal including normal percentage of neutrophils, lymphocytes, eosinophils and basophils. He has normal hemoglobin and platelets. He is completely asymptomatic from this finding.  The differential diagnosis was discussed today with the patient. This appears to be benign fluctuating leukocytosis without  any evidence to suggest hematological disorder. Myeloproliferative disorder and a lymphoproliferative disorder considered less likely. Leukocytosis related to malignancy is also less likely.  Given the chronicity and the fluctuating nature of this finding I see no evidence to suggest hematological disorder or any further workup at this time is not needed.  2. Follow-up: In happy to see him in the future as needed.

## 2017-03-12 ENCOUNTER — Encounter: Payer: Self-pay | Admitting: Physician Assistant

## 2017-03-12 ENCOUNTER — Ambulatory Visit (INDEPENDENT_AMBULATORY_CARE_PROVIDER_SITE_OTHER): Payer: Medicare Other

## 2017-03-12 ENCOUNTER — Ambulatory Visit (INDEPENDENT_AMBULATORY_CARE_PROVIDER_SITE_OTHER): Payer: Medicare Other | Admitting: Physician Assistant

## 2017-03-12 VITALS — BP 131/82 | HR 73 | Ht 71.0 in | Wt 218.0 lb

## 2017-03-12 DIAGNOSIS — I10 Essential (primary) hypertension: Secondary | ICD-10-CM | POA: Diagnosis not present

## 2017-03-12 DIAGNOSIS — I110 Hypertensive heart disease with heart failure: Secondary | ICD-10-CM | POA: Diagnosis not present

## 2017-03-12 DIAGNOSIS — I119 Hypertensive heart disease without heart failure: Secondary | ICD-10-CM

## 2017-03-12 DIAGNOSIS — J189 Pneumonia, unspecified organism: Secondary | ICD-10-CM

## 2017-03-12 DIAGNOSIS — D72829 Elevated white blood cell count, unspecified: Secondary | ICD-10-CM | POA: Diagnosis not present

## 2017-03-12 DIAGNOSIS — E119 Type 2 diabetes mellitus without complications: Secondary | ICD-10-CM | POA: Diagnosis not present

## 2017-03-12 DIAGNOSIS — R05 Cough: Secondary | ICD-10-CM | POA: Diagnosis not present

## 2017-03-12 DIAGNOSIS — Z122 Encounter for screening for malignant neoplasm of respiratory organs: Secondary | ICD-10-CM

## 2017-03-12 DIAGNOSIS — Z7689 Persons encountering health services in other specified circumstances: Secondary | ICD-10-CM

## 2017-03-12 DIAGNOSIS — J449 Chronic obstructive pulmonary disease, unspecified: Secondary | ICD-10-CM

## 2017-03-12 DIAGNOSIS — I5189 Other ill-defined heart diseases: Secondary | ICD-10-CM

## 2017-03-12 DIAGNOSIS — Z87891 Personal history of nicotine dependence: Secondary | ICD-10-CM | POA: Insufficient documentation

## 2017-03-12 DIAGNOSIS — R058 Other specified cough: Secondary | ICD-10-CM

## 2017-03-12 DIAGNOSIS — R053 Chronic cough: Secondary | ICD-10-CM | POA: Insufficient documentation

## 2017-03-12 DIAGNOSIS — I6523 Occlusion and stenosis of bilateral carotid arteries: Secondary | ICD-10-CM | POA: Diagnosis not present

## 2017-03-12 DIAGNOSIS — I519 Heart disease, unspecified: Secondary | ICD-10-CM | POA: Diagnosis not present

## 2017-03-12 MED ORDER — ALBUTEROL SULFATE HFA 108 (90 BASE) MCG/ACT IN AERS: 1.0000 | INHALATION_SPRAY | Freq: Every day | RESPIRATORY_TRACT | 0 refills | Status: DC

## 2017-03-12 MED ORDER — BENZONATATE 200 MG PO CAPS: 200.0000 mg | ORAL_CAPSULE | Freq: Three times a day (TID) | ORAL | 0 refills | Status: DC | PRN

## 2017-03-12 NOTE — Progress Notes (Signed)
HPI:                                                                Randy [name] is a 71 y.o. male who presents to [facility] today to establish care  Current concerns include: cough  This pleasant 71 yo M with PMH COPD/chronic bronchitis, HTN, CAD, HFpEF, leukocytosis presents today for new onset cough. Patient reports productive cough for 7 weeks. Worse when laying down. Productive of thick, clear sputum. Reports he quit smoking 1 year ago, approximately (40 packyears). States his COPD improved greatly after quitting smoking and he was taken off of his inhalers. Denies fever, chills, nightsweats, malaise, unintended weight loss, hemoptysis, SOB, DOE.  Denies recent international travel. 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   Past Medical History:  Diagnosis Date  . Carotid arterial disease ([place])    a. S/p R CEA;  b. 05/2015 Carotid U/S: RICA 0-45%, LICA 40-98%.  . Coronary artery disease    a. 2012 MI in [place] w/ PCI  //  b. 04/2013 MV: inflat scar, no ischemia.  //  c.   NSTEMI 5/17 >> Promus DES to LCx and POBA to OM1 (ISR).   . Diabetes mellitus without complication ([place])   . History of cardiac catheterization 10/21/15   a. NSTEMI 5/17: oLAD 40, pLCx 90 (Promus DES), OM2 90 ISR (POBA), oRCA 25   . History of echocardiogram    a. Mild LVH, EF 50-55%, possible inferolateral HK, grade 2 diastolic dysfunction, trivial AI, MAC  . History of nuclear stress test 04/2013   a. Myoview 12/14: Intermediate risk, prior inferolateral MI, no ischemia, EF 45%  . Hx of leukocytosis   . Hyperlipidemia   . Hypertension   . Hypertensive heart disease   . Morbid obesity ([place])   . Tobacco abuse    Past Surgical History:  Procedure Laterality Date  . CARDIAC CATHETERIZATION    . CARDIAC CATHETERIZATION N/A 10/21/2015   Procedure: Left Heart Cath and Coronary Angiography;  Surgeon: [name], MD;  Location: [facility];   Service: Cardiovascular;  Laterality: N/A;  . CARDIAC CATHETERIZATION N/A 10/21/2015   Procedure: Coronary Stent Intervention;  Surgeon: [name], MD;  Location: [facility];  Service: Cardiovascular;  Laterality: N/A;  . CARDIAC CATHETERIZATION N/A 10/21/2015   Procedure: Coronary Balloon Angioplasty;  Surgeon: [name], MD;  Location: [facility];  Service: Cardiovascular;  Laterality: N/A;  . CAROTID ENDARTERECTOMY    . CORONARY ANGIOPLASTY    . CORONARY STENT PLACEMENT     Social History  Substance Use Topics  . Smoking status: Former Smoker    Packs/day: 0.75    Years: 40.00    Types: Cigarettes    Start date: 06/18/1972    Quit date: 10/24/2015  . Smokeless tobacco: Never Used  . Alcohol use No   family history includes Hypertension in his father; Other in his mother.  ROS: Review of Systems  Constitutional: Negative.   HENT: Positive for hearing loss.   Respiratory: Positive for cough and sputum production. Negative for hemoptysis, shortness of breath and wheezing.   Cardiovascular: Negative.  Negative for chest pain and palpitations.  Gastrointestinal: Negative.   Genitourinary:       + erectile dysfunction  Musculoskeletal: Negative.   Skin: Negative.   Neurological: Negative.   Endo/Heme/Allergies: Negative.   Psychiatric/Behavioral: Negative.      Medications: Current Outpatient Prescriptions  Medication Sig Dispense Refill  . aspirin EC 81 MG tablet Take 81 mg by mouth daily.    [name] atorvastatin (LIPITOR) 40 MG tablet Take 1 tablet (40 mg total) by mouth daily. 90 tablet 3  . clopidogrel (PLAVIX) 75 MG tablet Take 1 tablet (75 mg total) by mouth daily. 90 tablet 3  . irbesartan (AVAPRO) 75 MG tablet Take 0.5 tablets by mouth daily as needed.    . metFORMIN (GLUCOPHAGE) 500 MG tablet TAKE 1/2 TABLET TWICE A DAY ORALLY  0  . metoprolol succinate (TOPROL-XL) 25 MG 24 hr tablet TAKE 1/2 TABLETS BY MOUTH DAILY 45 tablet 2  .  albuterol (PROVENTIL HFA;VENTOLIN HFA) 108 (90 Base) MCG/ACT inhaler Inhale 1 puff into the lungs at bedtime. 1 Inhaler 0  . benzonatate (TESSALON) 200 MG capsule Take 1 capsule (200 mg total) by mouth 3 (three) times daily as needed for cough. 45 capsule 0   No current facility-administered medications for this visit.    Allergies  Allergen Reactions  . Brilinta [Ticagrelor]        Objective:  BP 131/82   Pulse 73   Ht 5' 11"  (1.803 m)   Wt 218 lb (98.9 kg)   SpO2 97%   BMI 30.40 kg/m  Gen:  alert, not ill-appearing, no distress, appropriate for age 53: head normocephalic without obvious abnormality, conjunctiva and cornea clear, trachea midline Pulm: Normal work of breathing, normal phonation, clear to auscultation bilaterally, no wheezes, rales or rhonchi CV: Normal rate, regular rhythm, heart sounds are distant, no murmur appreciated Neuro: alert and oriented x 3, no tremor MSK: extremities atraumatic, normal gait and station, no peripheral edema Skin: intact, no rashes on exposed skin, no cyanosis Psych: well-groomed, cooperative, good eye contact, euthymic mood, affect mood-congruent, speech is articulate, and thought processes clear and goal-directed  Depression screen [facility] 2/9 03/12/2017  Decreased Interest 0  Down, Depressed, Hopeless 0  PHQ - 2 Score 0     No results found for this or any previous visit (from the past 72 hour(s)). No results found.    Assessment and Plan: 71 y.o. male with   1. Encounter to establish care - reviewed PMH, PSH, PFH, medications and allergies - reviewed health maintenance - has never had colon cancer screening - due for AAA screening - Pneumonia and influenza vaccines UTD  2. Cough present for greater than 3 weeks - differential diagnosis is COPD exacerbation, pneumonia, pertussis, and CHF exacerbation. Patient is saturating 97% on RA and rest, vitals are normal, stable. - DG Chest 2 View to assess for infiltrate and  pulmonary congestion. Will also order BNP given history of hypertensive cardiomyopathy - symptomatic management with Tessalon and Albuterol in the meantime - albuterol (PROVENTIL HFA;VENTOLIN HFA) 108 (90 Base) MCG/ACT inhaler; Inhale 1 puff into the lungs at bedtime.  Dispense: 1 Inhaler; Refill: 0 - benzonatate (TESSALON) 200 MG capsule; Take 1 capsule (200 mg total) by mouth 3 (three) times daily as needed for cough.  Dispense: 45 capsule; Refill: 0 - B Nat Peptide  3. Hypertension goal BP (blood pressure) < 140/90 BP Readings from Last 3 Encounters:  03/12/17 131/82  03/07/17 (!) 155/76  10/20/16 124/68  - BP at goal <140/90 - cont ARB and  beta blocker - therapeutic lifestyle changes   4. Diabetes mellitus without complication (HCC) - cont low-dose Metformin - Hemoglobin A1c  5. Hypertensive heart disease without congestive heart failure, unspecified heart failure type [facility]) - followed by Dr. Stanford Breed, Cardiology - Echo 10/22/2015 EF 50-55%, mild LVH, grade 2 diastolic dysfunction - checking B Nat Peptide today, patient does not appear volume overloaded on exam  6. COPD, Former heavy cigarette smoker - patient is a candidate for annual low-dose lung CT for lung cancer screening   Patient education and anticipatory guidance given Patient agrees with treatment plan Follow-up in 2 weeks or sooner as needed if symptoms worsen or fail to improve  Darlyne Russian PA-C

## 2017-03-12 NOTE — Patient Instructions (Addendum)
-   Chest x-ray and labs today - Tessalon up to three times daily for cough - Albuterol 1-2 puffs nightly at bedtime - Follow-up in 2 weeks   Cough, Adult Coughing is a reflex that clears your throat and your airways. Coughing helps to heal and protect your lungs. It is normal to cough occasionally, but a cough that happens with other symptoms or lasts a long time may be a sign of a condition that needs treatment. A cough may last only 2-3 weeks (acute), or it may last longer than 8 weeks (chronic). What are the causes? Coughing is commonly caused by:  Breathing in substances that irritate your lungs.  A viral or bacterial respiratory infection.  Allergies.  Asthma.  Postnasal drip.  Smoking.  Acid backing up from the stomach into the esophagus (gastroesophageal reflux).  Certain medicines.  Chronic lung problems, including COPD (or rarely, lung cancer).  Other medical conditions such as heart failure.  Follow these instructions at home: Pay attention to any changes in your symptoms. Take these actions to help with your discomfort:  Take medicines only as told by your health care provider. ? If you were prescribed an antibiotic medicine, take it as told by your health care provider. Do not stop taking the antibiotic even if you start to feel better. ? Talk with your health care provider before you take a cough suppressant medicine.  Drink enough fluid to keep your urine clear or pale yellow.  If the air is dry, use a cold steam vaporizer or humidifier in your bedroom or your home to help loosen secretions.  Avoid anything that causes you to cough at work or at home.  If your cough is worse at night, try sleeping in a semi-upright position.  Avoid cigarette smoke. If you smoke, quit smoking. If you need help quitting, ask your health care provider.  Avoid caffeine.  Avoid alcohol.  Rest as needed.  Contact a health care provider if:  You have new symptoms.  You  cough up pus.  Your cough does not get better after 2-3 weeks, or your cough gets worse.  You cannot control your cough with suppressant medicines and you are losing sleep.  You develop pain that is getting worse or pain that is not controlled with pain medicines.  You have a fever.  You have unexplained weight loss.  You have night sweats. Get help right away if:  You cough up blood.  You have difficulty breathing.  Your heartbeat is very fast. This information is not intended to replace advice given to you by your health care provider. Make sure you discuss any questions you have with your health care provider. Document Released: 11/11/2010 Document Revised: 10/21/2015 Document Reviewed: 07/22/2014 Elsevier Interactive Patient Education  2017 [employer].

## 2017-03-13 ENCOUNTER — Ambulatory Visit: Payer: Medicare Other

## 2017-03-13 ENCOUNTER — Encounter: Payer: Self-pay | Admitting: Physician Assistant

## 2017-03-13 DIAGNOSIS — Z122 Encounter for screening for malignant neoplasm of respiratory organs: Secondary | ICD-10-CM

## 2017-03-13 DIAGNOSIS — J449 Chronic obstructive pulmonary disease, unspecified: Secondary | ICD-10-CM | POA: Insufficient documentation

## 2017-03-13 DIAGNOSIS — I5189 Other ill-defined heart diseases: Secondary | ICD-10-CM

## 2017-03-13 DIAGNOSIS — I519 Heart disease, unspecified: Secondary | ICD-10-CM | POA: Insufficient documentation

## 2017-03-13 DIAGNOSIS — I700 Atherosclerosis of aorta: Secondary | ICD-10-CM | POA: Diagnosis not present

## 2017-03-13 DIAGNOSIS — Z87891 Personal history of nicotine dependence: Secondary | ICD-10-CM

## 2017-03-13 HISTORY — DX: Other ill-defined heart diseases: I51.89

## 2017-03-13 LAB — HEMOGLOBIN A1C
HEMOGLOBIN A1C: 6.2 %{Hb} — AB (ref <5.7)
Mean Plasma Glucose: 131 (calc)
eAG (mmol/L): 7.3 (calc)

## 2017-03-13 LAB — BRAIN NATRIURETIC PEPTIDE: Brain Natriuretic Peptide: 14 pg/mL (ref <100)

## 2017-03-13 MED ORDER — AZITHROMYCIN 250 MG PO TABS: ORAL_TABLET | ORAL | 0 refills | Status: DC

## 2017-03-13 MED ORDER — PREDNISONE 50 MG PO TABS: ORAL_TABLET | ORAL | 0 refills | Status: DC

## 2017-03-13 NOTE — Progress Notes (Signed)
Good morning Elenore Rota,  Your chest x-ray is concerning for pneumonia. I have sent an antibiotic and steroid to your [facility]. Please follow-up with me in 1 week.  I have also ordered a CT scan of your lungs. Your smoking history qualifies you for lung cancer screening. You will be contacted to schedule this.  Your diabetes is well-controlled (a1c 6.2). Continue your Metformin as you are taking it. We will check this every 6 months.  Best, Evlyn Clines

## 2017-03-13 NOTE — Telephone Encounter (Signed)
To PCP

## 2017-03-13 NOTE — Addendum Note (Signed)
Addended by: Nelson Chimes E on: 03/13/2017 09:15 AM   Modules accepted: Orders

## 2017-03-14 ENCOUNTER — Encounter: Payer: Self-pay | Admitting: Physician Assistant

## 2017-03-14 DIAGNOSIS — Z136 Encounter for screening for cardiovascular disorders: Secondary | ICD-10-CM

## 2017-03-14 DIAGNOSIS — I700 Atherosclerosis of aorta: Secondary | ICD-10-CM | POA: Insufficient documentation

## 2017-03-14 DIAGNOSIS — R918 Other nonspecific abnormal finding of lung field: Secondary | ICD-10-CM | POA: Insufficient documentation

## 2017-03-14 NOTE — Progress Notes (Signed)
Good morning Don,  Your CT scan shows a mass in the right upper lung. It is possible this is due to pneumonia . However, it could also be a tumor. We are going to treat you for pneumonia and get another picture of your lungs in 4 weeks. If you do not improve after antibiotics, I will likely refer you to a pulmonologist, a doctor who specializes in the lung disease.  I would like to see you back in the clinic next week for follow-up.  Best, Evlyn Clines

## 2017-03-19 ENCOUNTER — Ambulatory Visit (INDEPENDENT_AMBULATORY_CARE_PROVIDER_SITE_OTHER): Payer: Medicare Other | Admitting: Physician Assistant

## 2017-03-19 ENCOUNTER — Encounter: Payer: Self-pay | Admitting: Physician Assistant

## 2017-03-19 ENCOUNTER — Other Ambulatory Visit: Payer: Self-pay

## 2017-03-19 VITALS — BP 130/76 | HR 73 | Temp 98.1°F | Wt 219.0 lb

## 2017-03-19 DIAGNOSIS — R918 Other nonspecific abnormal finding of lung field: Secondary | ICD-10-CM

## 2017-03-19 DIAGNOSIS — J181 Lobar pneumonia, unspecified organism: Secondary | ICD-10-CM

## 2017-03-19 DIAGNOSIS — I6523 Occlusion and stenosis of bilateral carotid arteries: Secondary | ICD-10-CM

## 2017-03-19 DIAGNOSIS — J439 Emphysema, unspecified: Secondary | ICD-10-CM

## 2017-03-19 DIAGNOSIS — Z136 Encounter for screening for cardiovascular disorders: Secondary | ICD-10-CM

## 2017-03-19 DIAGNOSIS — J189 Pneumonia, unspecified organism: Secondary | ICD-10-CM

## 2017-03-19 MED ORDER — METFORMIN HCL 500 MG PO TABS: 250.0000 mg | ORAL_TABLET | Freq: Two times a day (BID) | ORAL | 0 refills | Status: DC

## 2017-03-19 NOTE — Progress Notes (Signed)
HPI:                                                                Randy [name] is a 71 y.o. male who presents to [facility]: [facility] today for pneumonia follow-up  Randy [name] is a pleasant 71 yo M with PMH of COPD (quit date 1 year ago), HTN, CAD, HFpEF, DMII, leukocytosis recently found to have lung mass of the RUL on CT chest. The mass was felt to be a possible necrotizing pneumonia versus malignancy. At the time he reported a cough productive of thick sputum for 7 weeks. He later developed some hemoptysis. He was treated for CAP with Azithromycin and Prednisone and noted improvement in his symptoms within 48 hours of starting treatment. Today he feels improved; denies fever, chills, nightsweats, malaise, unintended weight loss, cough, hemoptysis, SOB, DOE. He is wondering if it is okay to travel to [place] this month.   HTN: taking low-dose Irbesartan and Metoprolol daily. Compliant with medications. Checks BP's at home. BP range 114-139/68-85 Denies vision change, headache, chest pain with exertion, orthopnea, lightheadedness, syncope and edema.   Past Medical History:  Diagnosis Date  . Carotid arterial disease ([place])    a. S/p R CEA;  b. 05/2015 Carotid U/S: RICA 1-44%, LICA 31-54%.  . Coronary artery disease    a. 2012 MI in [place] w/ PCI  //  b. 04/2013 MV: inflat scar, no ischemia.  //  c.   NSTEMI 5/17 >> Promus DES to LCx and POBA to OM1 (ISR).   . Diabetes mellitus without complication ([place])   . Grade II diastolic dysfunction 00/86/7619  . History of cardiac catheterization 10/21/15   a. NSTEMI 5/17: oLAD 40, pLCx 90 (Promus DES), OM2 90 ISR (POBA), oRCA 25   . History of echocardiogram    a. Mild LVH, EF 50-55%, possible inferolateral HK, grade 2 diastolic dysfunction, trivial AI, MAC  . History of nuclear stress test 04/2013   a. Myoview 12/14: Intermediate risk, prior inferolateral MI, no ischemia, EF 45%  . Hx of leukocytosis   .  Hyperlipidemia   . Hypertension   . Hypertensive heart disease   . Morbid obesity ([place])   . Tobacco abuse    Past Surgical History:  Procedure Laterality Date  . CARDIAC CATHETERIZATION    . CARDIAC CATHETERIZATION N/A 10/21/2015   Procedure: Left Heart Cath and Coronary Angiography;  Surgeon: [name], MD;  Location: [facility];  Service: Cardiovascular;  Laterality: N/A;  . CARDIAC CATHETERIZATION N/A 10/21/2015   Procedure: Coronary Stent Intervention;  Surgeon: [name], MD;  Location: [facility];  Service: Cardiovascular;  Laterality: N/A;  . CARDIAC CATHETERIZATION N/A 10/21/2015   Procedure: Coronary Balloon Angioplasty;  Surgeon: [name], MD;  Location: [facility];  Service: Cardiovascular;  Laterality: N/A;  . CAROTID ENDARTERECTOMY    . CORONARY ANGIOPLASTY    . CORONARY STENT PLACEMENT     Social History  Substance Use Topics  . Smoking status: Former Smoker    Packs/day: 0.75    Years: 40.00    Types: Cigarettes    Start date: 06/18/1972    Quit date: 10/24/2015  . Smokeless tobacco: Never Used  .  Alcohol use No   family history includes Hypertension in his father; Other in his mother.  ROS: negative except as noted in the HPI  Medications: Current Outpatient Prescriptions  Medication Sig Dispense Refill  . albuterol (PROVENTIL HFA;VENTOLIN HFA) 108 (90 Base) MCG/ACT inhaler Inhale 1 puff into the lungs at bedtime. 1 Inhaler 0  . aspirin EC 81 MG tablet Take 81 mg by mouth daily.    [name] atorvastatin (LIPITOR) 40 MG tablet Take 1 tablet (40 mg total) by mouth daily. 90 tablet 3  . clopidogrel (PLAVIX) 75 MG tablet Take 1 tablet (75 mg total) by mouth daily. 90 tablet 3  . irbesartan (AVAPRO) 75 MG tablet Take 0.5 tablets by mouth daily as needed.    . metoprolol succinate (TOPROL-XL) 25 MG 24 hr tablet TAKE 1/2 TABLETS BY MOUTH DAILY 45 tablet 2  . metFORMIN (GLUCOPHAGE) 500 MG tablet Take 0.5 tablets (250 mg  total) by mouth 2 (two) times daily with a meal. 90 tablet 0   No current facility-administered medications for this visit.    Allergies  Allergen Reactions  . Brilinta [Ticagrelor]        Objective:  BP 130/76   Pulse 73   Temp 98.1 F (36.7 C) (Oral)   Wt 219 lb (99.3 kg)   SpO2 96%   BMI 30.54 kg/m  Gen:  alert, not ill-appearing, no distress, appropriate for age, obese male HEENT: head normocephalic without obvious abnormality, conjunctiva and cornea clear, well groomed, trachea midline Pulm: Normal work of breathing, normal phonation, clear to auscultation bilaterally, no wheezes, rales or rhonchi CV: Normal rate, regular rhythm, heart sounds distant, no murmurs, clicks or rubs  Neuro: alert and oriented x 3, no tremor MSK: extremities atraumatic, normal gait and station Skin: intact, no rashes on exposed skin, no cyanosis Psych: well-groomed, cooperative, good eye contact, euthymic mood, affect mood-congruent, speech is articulate, and thought processes clear and goal-directed  CLINICAL DATA:  Lung cancer screening. Fifty pack-year history. Former asymptomatic smoker.  EXAM: CT CHEST WITHOUT CONTRAST  TECHNIQUE: Multidetector CT imaging of the chest was performed following the standard protocol without IV contrast.  COMPARISON:  None.  FINDINGS: Cardiovascular: The heart size is normal. Aortic atherosclerosis. Calcifications within the left main coronary artery, LAD, left circumflex and RCA. No pericardial effusion  Mediastinum/Nodes: The trachea appears patent and is midline. Normal appearance of the esophagus. Lymph nodes. No hilar adenopathy. No axillary or supraclavicular adenopathy.  Lungs/Pleura: No pleural effusion. Moderate changes of emphysema. There is a cavitary lung mass within the central right upper lobe measuring 3.3 cm, image 157 of series 5 there is complete occlusion of the right upper lobe bronchus. Postobstructive pneumonitis  within the right upper lobe is identified.  Upper Abdomen: No acute abnormality.  Musculoskeletal: There is degenerative disc disease identified within the thoracic spine. No aggressive lytic or sclerotic bone lesions.  IMPRESSION: 1. There is a central right upper lobe cavitary lung mass with associated postobstructive pneumonitis. If there are signs or symptoms of infection then it is conceivable that this could represent an necrotizing pneumonia. However, the diagnosis of exclusion in this patient who has a long-standing smoking history and evidence of smoking related changes in both lungs would be pulmonary neoplasm. Management strategies include antibiotic therapy if there are signs or symptoms of pneumonia with follow-up imaging to ensure complete resolution. If there is no evidence for pneumonia then further investigation with PET-CT and tissue sampling is recommended. 2. Aortic Atherosclerosis (ICD10-I70.0). Three vessel  coronary artery calcifications noted. These results will be called to the ordering clinician or representative by the Radiologist Assistant, and communication documented in the PACS or zVision Dashboard.   Electronically Signed   By: Kerby Moors M.D.   On: 03/13/2017 16:31   No results found for this or any previous visit (from the past 72 hour(s)). No results found.    Assessment and Plan: 71 y.o. male with   1. Mass of upper lobe of right lung - I think it is reassuring that he responded to pneumonia treatment. Given smoking history and nature of the mass, recommend consultation with Pulmonology to decide on appropriate work-up/surveillance - Ambulatory referral to Pulmonology  2. Community acquired pneumonia of right upper lobe of lung ([place]) - completed Azithromycin/Prednisone; clinically improved, vital signs normal, SpO2 96% on RA at rest - I recommended he defer any air travel for now  3. Pulmonary emphysema, unspecified  emphysema type ([place]) - Ambulatory referral to Pulmonology  4. Encounter for abdominal aortic aneurysm (AAA) screening - patient has known CAD, +smoking history and recent CT chest showed aortic atherosclerosis. He states he has never had AAA screening - [place] Aorta Initial Medicare Screen; Future   Patient education and anticipatory guidance given Patient agrees with treatment plan Follow-up in 6 months for DM and HTN or sooner as needed if symptoms worsen or fail to improve  Darlyne Russian PA-C

## 2017-03-20 DIAGNOSIS — J189 Pneumonia, unspecified organism: Secondary | ICD-10-CM | POA: Insufficient documentation

## 2017-03-20 DIAGNOSIS — J181 Lobar pneumonia, unspecified organism: Secondary | ICD-10-CM

## 2017-03-21 ENCOUNTER — Encounter: Payer: Self-pay | Admitting: Physician Assistant

## 2017-03-21 DIAGNOSIS — J850 Gangrene and necrosis of lung: Secondary | ICD-10-CM

## 2017-03-22 ENCOUNTER — Ambulatory Visit (HOSPITAL_BASED_OUTPATIENT_CLINIC_OR_DEPARTMENT_OTHER)
Admission: RE | Admit: 2017-03-22 | Discharge: 2017-03-22 | Disposition: A | Discharge status: Home / Self Care | Payer: Medicare Other | Source: Ambulatory Visit | Attending: Physician Assistant | Admitting: Physician Assistant

## 2017-03-22 DIAGNOSIS — I714 Abdominal aortic aneurysm, without rupture: Secondary | ICD-10-CM | POA: Diagnosis not present

## 2017-03-22 DIAGNOSIS — I77811 Abdominal aortic ectasia: Secondary | ICD-10-CM | POA: Insufficient documentation

## 2017-03-22 DIAGNOSIS — Z136 Encounter for screening for cardiovascular disorders: Secondary | ICD-10-CM | POA: Diagnosis not present

## 2017-03-22 DIAGNOSIS — I700 Atherosclerosis of aorta: Secondary | ICD-10-CM | POA: Insufficient documentation

## 2017-03-22 DIAGNOSIS — Z87891 Personal history of nicotine dependence: Secondary | ICD-10-CM | POA: Diagnosis not present

## 2017-03-22 MED ORDER — MOXIFLOXACIN HCL 400 MG PO TABS: 400.0000 mg | ORAL_TABLET | Freq: Every day | ORAL | 0 refills | Status: DC

## 2017-03-23 ENCOUNTER — Encounter: Payer: Self-pay | Admitting: Physician Assistant

## 2017-03-23 DIAGNOSIS — I77811 Abdominal aortic ectasia: Secondary | ICD-10-CM

## 2017-03-23 HISTORY — DX: Abdominal aortic ectasia: I77.811

## 2017-03-23 NOTE — Telephone Encounter (Signed)
Good morning,  Your ultrasound shows that your aorta is mildly dilated, but there is no aneurysm. Recommend repeat ultrasound in 5 years.  Best, Evlyn Clines

## 2017-03-26 ENCOUNTER — Encounter: Payer: Self-pay | Admitting: Pulmonary Disease

## 2017-03-26 ENCOUNTER — Ambulatory Visit (INDEPENDENT_AMBULATORY_CARE_PROVIDER_SITE_OTHER): Payer: Medicare Other | Admitting: Pulmonary Disease

## 2017-03-26 ENCOUNTER — Ambulatory Visit: Payer: Medicare Other | Admitting: Physician Assistant

## 2017-03-26 VITALS — BP 120/71 | HR 78 | Ht 71.0 in | Wt 212.0 lb

## 2017-03-26 DIAGNOSIS — R0600 Dyspnea, unspecified: Secondary | ICD-10-CM

## 2017-03-26 DIAGNOSIS — J432 Centrilobular emphysema: Secondary | ICD-10-CM

## 2017-03-26 DIAGNOSIS — J984 Other disorders of lung: Secondary | ICD-10-CM

## 2017-03-26 DIAGNOSIS — I6523 Occlusion and stenosis of bilateral carotid arteries: Secondary | ICD-10-CM | POA: Diagnosis not present

## 2017-03-26 DIAGNOSIS — Z01812 Encounter for preprocedural laboratory examination: Secondary | ICD-10-CM

## 2017-03-26 NOTE — Patient Instructions (Signed)
Lung mass: While I agree there is some pneumonia in your right upper lobe, I am very concerned that she will have a lung tumor there We will arrange for a bronchoscopy on Monday, November 5 in the afternoon at [facility] long hospital endoscopy suite Stop taking aspirin and Plavix now We will check a complete blood count  Shortness of breath: We will check a lung function test to evaluate for COPD  Recent pneumonia: Continue moxifloxacin as guided by your primary care physician  We will follow-up with you in 12-14 days, once the bronchoscopy results are available

## 2017-03-26 NOTE — Progress Notes (Signed)
Subjective:    Patient ID: Randy [name], male    DOB: [birth year], 71 y.o.   MRN: [id number]  Synopsis: Referred in 2018 with CAD and hyperlipidemia for a right upper lobe mass, thought to be necrotizing pneumonia.  He also has a history of COPD.  He sees Dr. Harrington Challenger for his CAD.   HPI  Chief Complaint  Patient presents with  . Advice Only    Referred for pneumonia, chronic cough.     Joan is here to see me for evaluation of an abnormal CT scan of the chest seen recently.  He tells me that he had a cough for three weeks productive of clear mucus.  He had a chest X-ray that was abnormal and a CT scan.  He was diagnosed with pneumonia in the right upper lobe and was treated with prednisone and azithromycin.  He completed treatment and he says his cough is 50% better.  His PCP added moxifloxacin and he has about three days left on that.  He says that he still feels like he is getting better.  He has a cough which has improved and is still brining up some clear mucus.  He is no longer feeling short of breath right now, only when he climbs stairs.  He says that this started in [place] all the sudden, no sick contact, no no fevers or chills.  Some runny nose.  He has noticed some hemoptysis with the cough.   He used to smoke cigarettes for 40 years, just under 1ppd, quit in 2017.    No hospitalizations for respiratory.    He takes Plavix for his CAD, his last stent was placed 08/2015.    He says that he has been told he had emphysema but he has not been told he has COPD.    He has been evalutaed recently for an elevated WBC by hematology and was told there was no evidence of malignancy.      Past Medical History:  Diagnosis Date  . 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 Repeat US due 2023  . Carotid arterial disease ([place])    a. S/p R CEA;  b. 05/2015 Carotid U/S: RICA 1-70%, LICA 01-74%.  . Coronary artery disease    a. 2012 MI in [place] w/ PCI  //  b. 04/2013 MV: inflat  scar, no ischemia.  //  c.   NSTEMI 5/17 >> Promus DES to LCx and POBA to OM1 (ISR).   . Diabetes mellitus without complication ([place])   . Grade II diastolic dysfunction 94/49/6759  . History of cardiac catheterization 10/21/15   a. NSTEMI 5/17: oLAD 40, pLCx 90 (Promus DES), OM2 90 ISR (POBA), oRCA 25   . History of echocardiogram    a. Mild LVH, EF 50-55%, possible inferolateral HK, grade 2 diastolic dysfunction, trivial AI, MAC  . History of nuclear stress test 04/2013   a. Myoview 12/14: Intermediate risk, prior inferolateral MI, no ischemia, EF 45%  . Hx of leukocytosis   . Hyperlipidemia   . Hypertension   . Hypertensive heart disease   . Morbid obesity ([place])   . Tobacco abuse      Family History  Problem Relation Age of Onset  . Other Mother        died @ 52 - complications following surgery.  . Hypertension Father        died in [place] @ 67     Social History   Social History  .  Marital status: Married    Spouse name: ardith  . Number of children: 7  . Years of education: college   Occupational History  . retired    Social History Main Topics  . Smoking status: Former Smoker    Packs/day: 0.75    Years: 40.00    Types: Cigarettes    Start date: 06/18/1972    Quit date: 10/24/2015  . Smokeless tobacco: Never Used  . Alcohol use No  . Drug use: No  . Sexual activity: Not Currently   Other Topics Concern  . Not on file   Social History Narrative   Lives in [place] with wife.  Does not routinely exercise.  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     Allergies  Allergen Reactions  . Brilinta [Ticagrelor]      Outpatient Medications Prior to Visit  Medication Sig Dispense Refill  . aspirin EC 81 MG tablet Take 81 mg by mouth daily.    [name] atorvastatin (LIPITOR) 40 MG tablet Take 1 tablet (40 mg total) by mouth daily. 90 tablet 3  . clopidogrel (PLAVIX) 75 MG tablet Take 1 tablet (75 mg total) by mouth daily. 90 tablet 3  .  irbesartan (AVAPRO) 75 MG tablet Take 0.5 tablets by mouth daily as needed.    . metFORMIN (GLUCOPHAGE) 500 MG tablet Take 0.5 tablets (250 mg total) by mouth 2 (two) times daily with a meal. 90 tablet 0  . metoprolol succinate (TOPROL-XL) 25 MG 24 hr tablet TAKE 1/2 TABLETS BY MOUTH DAILY 45 tablet 2  . moxifloxacin (AVELOX) 400 MG tablet Take 1 tablet (400 mg total) by mouth daily. 7 tablet 0  . albuterol (PROVENTIL HFA;VENTOLIN HFA) 108 (90 Base) MCG/ACT inhaler Inhale 1 puff into the lungs at bedtime. (Patient not taking: Reported on 03/26/2017) 1 Inhaler 0   No facility-administered medications prior to visit.        Review of Systems     Objective:   Physical Exam  Vitals:   03/26/17 1446  BP: 120/71  Pulse: 78  SpO2: 95%  Weight: 212 lb (96.2 kg)  Height: _0  (1.803 m)    Gen: well appearing, no acute distress HENT: NCAT, OP clear, neck supple without masses Eyes: PERRL, EOMi Lymph: no cervical lymphadenopathy PULM: CTA B CV: RRR, no mgr, no JVD GI: BS+, soft, nontender, no hsm Derm: no rash or skin breakdown MSK: normal bulk and tone Neuro: A&Ox4, CN II-XII intact, strength 5/5 in all 4 extremities Psyche: normal mood and affect    CBC    Component Value Date/Time   WBC 12.3 (H) 10/20/2016 1508   WBC 13.8 (H) 10/29/2015 1145   RBC 4.81 10/20/2016 1508   RBC 5.03 10/29/2015 1145   HGB 13.5 10/20/2016 1508   HCT 42.0 10/20/2016 1508   PLT 326 10/20/2016 1508   MCV 87 10/20/2016 1508   MCH 28.1 10/20/2016 1508   MCH 30.2 10/29/2015 1145   MCHC 32.1 10/20/2016 1508   MCHC 33.6 10/29/2015 1145   RDW 13.1 10/20/2016 1508   BMET    Component Value Date/Time   NA 138 10/20/2016 1508   K 5.1 10/20/2016 1508   CL 99 10/20/2016 1508   CO2 24 10/20/2016 1508   GLUCOSE 99 10/20/2016 1508   GLUCOSE 109 (H) 10/29/2015 1145   BUN 11 10/20/2016 1508   CREATININE 0.87 10/20/2016 1508   CALCIUM 9.5 10/20/2016 1508   GFRNONAA 87 10/20/2016 1508  GFRAA  100 10/20/2016 1508   Chest imaging: 02/2017 CT chest images reviewed: upper lobe predominant centrilobular emphsema mild, necrotizing RUQ mass  [facility] Onc records reviewed: has leukocytosis of undetermined significance  Records from his visit with primary care reviewed where he was noted to have a right upper lobe lung mass, possible necrotizing pneumonia and he was referred to [place] for further evaluation.  He had previously been treated with prednisone and azithromycin     Assessment & Plan:   Dyspnea, unspecified type - Plan: Pulmonary function test  Pre-procedural laboratory examination - Plan: CBC w/Diff  Centrilobular emphysema ([place])  Cavitating mass in right upper lung lobe  Discussion: I am very concerned about Elenore Rota.  He has a cavitary lesion in the right upper lobe with surrounding changes worrisome for pneumonia.  However, the mass itself seems to be larger than what would be expected for just pneumonia.  Further, his clinical history is not consistent with what we would normally expect with necrotizing pneumonia and that he never had a fever or significant purulent cough.  Given his extensive smoking history I feel the best approach is to proceed with a bronchoscopy after he has held aspirin and Plavix for a week.  I described this to him in detail today and he is willing to proceed.  He also has centrilobular emphysema and likely has COPD, will get a lung function test to evaluate.  Plan:  Lung mass: While I agree there is some pneumonia in your right upper lobe, I am very concerned that she will have a lung tumor there We will arrange for a bronchoscopy on Monday, November 5 in the afternoon at [facility] long hospital endoscopy suite Stop taking aspirin and Plavix now We will check a complete blood count  Shortness of breath: We will check a lung function test to evaluate for COPD  Recent pneumonia: Continue moxifloxacin as guided by your primary care physician  We will  follow-up with you in 12-14 days, once the bronchoscopy results are available   Current Outpatient Prescriptions:  .  aspirin EC 81 MG tablet, Take 81 mg by mouth daily., Disp: , Rfl:  .  atorvastatin (LIPITOR) 40 MG tablet, Take 1 tablet (40 mg total) by mouth daily., Disp: 90 tablet, Rfl: 3 .  clopidogrel (PLAVIX) 75 MG tablet, Take 1 tablet (75 mg total) by mouth daily., Disp: 90 tablet, Rfl: 3 .  irbesartan (AVAPRO) 75 MG tablet, Take 0.5 tablets by mouth daily as needed., Disp: , Rfl:  .  metFORMIN (GLUCOPHAGE) 500 MG tablet, Take 0.5 tablets (250 mg total) by mouth 2 (two) times daily with a meal., Disp: 90 tablet, Rfl: 0 .  metoprolol succinate (TOPROL-XL) 25 MG 24 hr tablet, TAKE 1/2 TABLETS BY MOUTH DAILY, Disp: 45 tablet, Rfl: 2 .  moxifloxacin (AVELOX) 400 MG tablet, Take 1 tablet (400 mg total) by mouth daily., Disp: 7 tablet, Rfl: 0 .  albuterol (PROVENTIL HFA;VENTOLIN HFA) 108 (90 Base) MCG/ACT inhaler, Inhale 1 puff into the lungs at bedtime. (Patient not taking: Reported on 03/26/2017), Disp: 1 Inhaler, Rfl: 0

## 2017-03-27 LAB — CBC WITH DIFFERENTIAL/PLATELET
BASOS: 0 %
Basophils Absolute: 0.1 10*3/uL (ref 0.0–0.2)
EOS (ABSOLUTE): 0.3 10*3/uL (ref 0.0–0.4)
EOS: 2 %
HEMATOCRIT: 42.7 % (ref 37.5–51.0)
HEMOGLOBIN: 14.2 g/dL (ref 13.0–17.7)
IMMATURE GRANS (ABS): 0 10*3/uL (ref 0.0–0.1)
IMMATURE GRANULOCYTES: 0 %
LYMPHS: 20 %
Lymphocytes Absolute: 3.2 10*3/uL — ABNORMAL HIGH (ref 0.7–3.1)
MCH: 28.5 pg (ref 26.6–33.0)
MCHC: 33.3 g/dL (ref 31.5–35.7)
MCV: 86 fL (ref 79–97)
MONOCYTES: 8 %
Monocytes Absolute: 1.2 10*3/uL — ABNORMAL HIGH (ref 0.1–0.9)
NEUTROS PCT: 70 %
Neutrophils Absolute: 10.9 10*3/uL — ABNORMAL HIGH (ref 1.4–7.0)
Platelets: 354 10*3/uL (ref 150–379)
RBC: 4.98 x10E6/uL (ref 4.14–5.80)
RDW: 14.1 % (ref 12.3–15.4)
WBC: 15.7 10*3/uL — AB (ref 3.4–10.8)

## 2017-04-02 ENCOUNTER — Encounter (HOSPITAL_COMMUNITY): Payer: Medicare Other

## 2017-04-02 ENCOUNTER — Encounter: Payer: Self-pay | Admitting: Physician Assistant

## 2017-04-03 ENCOUNTER — Other Ambulatory Visit: Payer: Self-pay | Admitting: Pulmonary Disease

## 2017-04-03 ENCOUNTER — Encounter (HOSPITAL_COMMUNITY): Payer: Self-pay | Admitting: Respiratory Therapy

## 2017-04-03 ENCOUNTER — Ambulatory Visit (HOSPITAL_COMMUNITY)
Admission: RE | Admit: 2017-04-03 | Discharge: 2017-04-03 | Disposition: A | Discharge status: Home / Self Care | Payer: Medicare Other | Source: Ambulatory Visit | Attending: Pulmonary Disease | Admitting: Pulmonary Disease

## 2017-04-03 ENCOUNTER — Encounter (HOSPITAL_COMMUNITY)
Admission: RE | Disposition: A | Discharge status: Home / Self Care | Payer: Self-pay | Source: Ambulatory Visit | Attending: Pulmonary Disease

## 2017-04-03 ENCOUNTER — Ambulatory Visit (HOSPITAL_COMMUNITY): Payer: Medicare Other

## 2017-04-03 ENCOUNTER — Other Ambulatory Visit: Payer: Self-pay | Admitting: Physician Assistant

## 2017-04-03 DIAGNOSIS — R058 Other specified cough: Secondary | ICD-10-CM

## 2017-04-03 DIAGNOSIS — E119 Type 2 diabetes mellitus without complications: Secondary | ICD-10-CM | POA: Diagnosis not present

## 2017-04-03 DIAGNOSIS — Z7901 Long term (current) use of anticoagulants: Secondary | ICD-10-CM | POA: Diagnosis not present

## 2017-04-03 DIAGNOSIS — I252 Old myocardial infarction: Secondary | ICD-10-CM | POA: Diagnosis not present

## 2017-04-03 DIAGNOSIS — Z87891 Personal history of nicotine dependence: Secondary | ICD-10-CM | POA: Diagnosis not present

## 2017-04-03 DIAGNOSIS — I119 Hypertensive heart disease without heart failure: Secondary | ICD-10-CM | POA: Insufficient documentation

## 2017-04-03 DIAGNOSIS — C3411 Malignant neoplasm of upper lobe, right bronchus or lung: Secondary | ICD-10-CM | POA: Diagnosis not present

## 2017-04-03 DIAGNOSIS — Z955 Presence of coronary angioplasty implant and graft: Secondary | ICD-10-CM | POA: Insufficient documentation

## 2017-04-03 DIAGNOSIS — R918 Other nonspecific abnormal finding of lung field: Secondary | ICD-10-CM

## 2017-04-03 DIAGNOSIS — R05 Cough: Secondary | ICD-10-CM

## 2017-04-03 DIAGNOSIS — Z9889 Other specified postprocedural states: Secondary | ICD-10-CM

## 2017-04-03 DIAGNOSIS — I2510 Atherosclerotic heart disease of native coronary artery without angina pectoris: Secondary | ICD-10-CM | POA: Insufficient documentation

## 2017-04-03 DIAGNOSIS — E785 Hyperlipidemia, unspecified: Secondary | ICD-10-CM | POA: Insufficient documentation

## 2017-04-03 HISTORY — PX: VIDEO BRONCHOSCOPY: SHX5072

## 2017-04-03 SURGERY — BRONCHOSCOPY, WITH FLUOROSCOPY
Anesthesia: Moderate Sedation | Laterality: Bilateral

## 2017-04-03 MED ORDER — LIDOCAINE HCL 2 % EX GEL: 1.0000 "application " | Freq: Once | CUTANEOUS | Status: DC

## 2017-04-03 MED ORDER — VENTOLIN HFA 108 (90 BASE) MCG/ACT IN AERS: 1.0000 | INHALATION_SPRAY | RESPIRATORY_TRACT | 5 refills | Status: DC | PRN

## 2017-04-03 MED ORDER — FENTANYL CITRATE (PF) 100 MCG/2ML IJ SOLN
INTRAMUSCULAR | Status: AC
  Filled 2017-04-03: qty 4

## 2017-04-03 MED ORDER — FENTANYL CITRATE (PF) 100 MCG/2ML IJ SOLN
INTRAMUSCULAR | Status: DC | PRN
  Administered 2017-04-03: 25 ug via INTRAVENOUS
  Administered 2017-04-03: 50 ug via INTRAVENOUS
  Administered 2017-04-03: 25 ug via INTRAVENOUS

## 2017-04-03 MED ORDER — LIDOCAINE HCL 2 % EX GEL
CUTANEOUS | Status: DC | PRN
  Administered 2017-04-03: 1

## 2017-04-03 MED ORDER — SODIUM CHLORIDE 0.9 % IV SOLN
INTRAVENOUS | Status: DC
  Administered 2017-04-03: 15:00:00 via INTRAVENOUS

## 2017-04-03 MED ORDER — PHENYLEPHRINE HCL 0.25 % NA SOLN
NASAL | Status: DC | PRN
  Administered 2017-04-03: 2 via NASAL

## 2017-04-03 MED ORDER — MIDAZOLAM HCL 5 MG/ML IJ SOLN
INTRAMUSCULAR | Status: AC
  Filled 2017-04-03: qty 2

## 2017-04-03 MED ORDER — BUTAMBEN-TETRACAINE-BENZOCAINE 2-2-14 % EX AERO: 1.0000 | INHALATION_SPRAY | Freq: Once | CUTANEOUS | Status: DC

## 2017-04-03 MED ORDER — MIDAZOLAM HCL 10 MG/2ML IJ SOLN
INTRAMUSCULAR | Status: DC | PRN
  Administered 2017-04-03 (×2): 1 mg via INTRAVENOUS
  Administered 2017-04-03: 2 mg via INTRAVENOUS

## 2017-04-03 MED ORDER — LIDOCAINE HCL 1 % IJ SOLN
INTRAMUSCULAR | Status: DC | PRN
  Administered 2017-04-03: 6 mL via RESPIRATORY_TRACT

## 2017-04-03 MED ORDER — PHENYLEPHRINE HCL 0.25 % NA SOLN: 1.0000 | Freq: Four times a day (QID) | NASAL | Status: DC | PRN

## 2017-04-03 MED ORDER — HYDROCOD POLST-CPM POLST ER 10-8 MG/5ML PO SUER
5.0000 mL | Freq: Two times a day (BID) | ORAL | 0 refills | Status: DC | PRN
Start: 2017-04-03 — End: 2017-05-01

## 2017-04-03 NOTE — Progress Notes (Signed)
Video Bronchoscopy done  Intervention Bronchial washing Intervention Bronchial biopsy Intervention Bronchial brushing  Procedure tolerated well

## 2017-04-03 NOTE — Op Note (Signed)
[facility] Cardiopulmonary Patient Name: Randy [name] Procedure Date: 04/03/2017 MRN: [id number] Attending MD: [name] , MD Date of Birth: 1946-01-22 CSN: [id number] Age: 71 Admit Type: Outpatient Ethnicity: Not Hispanic or Latino Procedure:            Bronchoscopy Indications:          Right upper lobe mass Providers:            Nathaneil Canary B. Lake Bells, MD, Andre Lefort RRT,RCP, Cherre Huger RRT, RCP Referring MD:          Medicines:            Midazolam 4 mg IV, Fentanyl 100 mcg IV, Lidocaine 2%                        Nebulizer 2.5 mL, Lidocaine 1% applied to cords 5 mL,                        Lidocaine 1% applied to the tracheobronchial tree 9 mL Complications:        No immediate complications Estimated Blood Loss: Estimated blood loss was minimal. Procedure:      Pre-Anesthesia Assessment:      - A History and Physical has been performed. Patient meds and allergies       have been reviewed. The risks and benefits of the procedure and the       sedation options and risks were discussed with the patient. All       questions were answered and informed consent was obtained. Patient       identification and proposed procedure were verified prior to the       procedure by the physician and the technician in the procedure room.       Mental Status Examination: alert and oriented. Airway Examination:       normal oropharyngeal airway. Respiratory Examination: clear to       auscultation. CV Examination: RRR, no murmurs, no S3 or S4. ASA Grade       Assessment: II - A patient with mild systemic disease. After reviewing       the risks and benefits, the patient was deemed in satisfactory condition       to undergo the procedure. The anesthesia plan was to use moderate       sedation / analgesia (conscious sedation). Immediately prior to       administration of medications, the patient was re-assessed for adequacy       to receive  sedatives. The heart rate, respiratory rate, oxygen       saturations, blood pressure, adequacy of pulmonary ventilation, and       response to care were monitored throughout the procedure. The physical       status of the patient was re-assessed after the procedure.      After obtaining informed consent, the bronchoscope was passed under       direct vision. Throughout the procedure, the patient's blood pressure,       pulse, and oxygen saturations were monitored continuously. the [id number]       ([id number]) scope was introduced through the left nostril and advanced to       the tracheobronchial tree of both lungs. The procedure was accomplished  without difficulty. The patient tolerated the procedure well. The total       duration of the procedure was 15 minutes. Findings:      The nasopharynx/oropharynx appears normal. The larynx appears normal.       The vocal cords appear normal. The subglottic space is normal. The       trachea is of normal caliber. The carina is sharp. The tracheobronchial       tree of the left lung was examined to at least the first subsegmental       level. Bronchial mucosa and anatomy in the left lung are normal; there       are no endobronchial lesions, and no secretions.      Right Lung Abnormalities: The right mainstem, bronchus intermedius,       right middle lobe and right lower lobe were inspected to the first       subsegment and were found to be normal. However when attention was       turned to the right upper lobe a partially obstructing mass was found       proximally, at the orifice in the anterior segment of the right upper       lobe (B3). The mass was large and endobronchial and infiltrative. The       lesion was not traversed. Washings were obtained in the right upper lobe       and sent for routine cytology. The return was blood-tinged.       Fluoroscopically guided transbronchial brushings were obtained in the       anterior segment of the  right upper lobe and sent for routine cytology.       Two samples were obtained. Endobronchial biopsies were performed in the       right upper lobe using forceps and sent for histopathology examination.       6 were obtained. Impression:      - Right upper lobe mass      - The left lung was normal.      - An endobronchial and infiltrative mass was found in the anterior       segment of the right upper lobe (B3). This lesion is likely malignant.      - Washings were obtained.      - Fluoroscopically guided transbronchial brushings were obtained.      - An endobronchial biopsy was performed. Moderate Sedation:      Moderate (conscious) sedation was personally administered by the       endoscopist. The following parameters were monitored: oxygen saturation,       heart rate, blood pressure, and response to care. Total physician       intraservice time was 21 minutes. Recommendation:      - Await biopsy and cytology results. Procedure Code(s):      --- Professional ---      [phone], Bronchoscopy, rigid or flexible, including fluoroscopic guidance,       when performed; with bronchial or endobronchial biopsy(s), single or       multiple sites      31623, Bronchoscopy, rigid or flexible, including fluoroscopic guidance,       when performed; with brushing or protected brushings      99152, Moderate sedation services provided by the same physician or       other qualified health care professional performing the diagnostic or       therapeutic service that the sedation supports, requiring  the presence       of an independent trained observer to assist in the monitoring of the       patient's level of consciousness and physiological status; initial 15       minutes of intraservice time, patient age 63 years or older Diagnosis Code(s):      --- Professional ---      R91.8, Other nonspecific abnormal finding of lung field      J98.9, Respiratory disorder, unspecified CPT copyright 2016  American Medical Association. All rights reserved. The codes documented in this report are preliminary and upon coder review may  be revised to meet current compliance requirements. Norlene Campbell, MD [name], MD 04/03/2017 3:23:57 PM This report has been signed electronically. Number of Addenda: 0 Scope In: 2:58:09 PM Scope Out: 3:15:01 PM

## 2017-04-03 NOTE — H&P (Signed)
[facility] H&P  CC lung mass HPI: Referred in 02/2017 for evaluation of a RUL pneumonia.  Has a history of tobacco abuse.  CT scan of the chest worrisome for malignancy.  He takes blood thinners for a history of CAD with stent in place. He feels OK today. He says the cough is improving.   Past Medical History:  Diagnosis Date  . 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 Repeat US due 2023  . Carotid arterial disease ([place])    a. S/p R CEA;  b. 05/2015 Carotid U/S: RICA 3-97%, LICA 67-34%.  . Coronary artery disease    a. 2012 MI in [place] w/ PCI  //  b. 04/2013 MV: inflat scar, no ischemia.  //  c.   NSTEMI 5/17 >> Promus DES to LCx and POBA to OM1 (ISR).   . Diabetes mellitus without complication ([place])   . Grade II diastolic dysfunction 19/37/9024  . History of cardiac catheterization 10/21/15   a. NSTEMI 5/17: oLAD 40, pLCx 90 (Promus DES), OM2 90 ISR (POBA), oRCA 25   . History of echocardiogram    a. Mild LVH, EF 50-55%, possible inferolateral HK, grade 2 diastolic dysfunction, trivial AI, MAC  . History of nuclear stress test 04/2013   a. Myoview 12/14: Intermediate risk, prior inferolateral MI, no ischemia, EF 45%  . Hx of leukocytosis   . Hyperlipidemia   . Hypertension   . Hypertensive heart disease   . Morbid obesity ([place])   . Tobacco abuse      Family History  Problem Relation Age of Onset  . Other Mother        died @ 58 - complications following surgery.  . Hypertension Father        died in MVA @ 44     Social History   Socioeconomic History  . Marital status: Married    Spouse name: ardith  . Number of children: 7  . Years of education: college  . Highest education level: Not on file  Social Needs  . Financial resource strain: Not on file  . Food insecurity - worry: Not on file  . Food insecurity - inability: Not on file  . Transportation needs - medical: Not on file  . Transportation needs - non-medical: Not on file  Occupational History  .  Occupation: retired  Tobacco Use  . Smoking status: Former Smoker    Packs/day: 0.75    Years: 40.00    Pack years: 30.00    Types: Cigarettes    Start date: 06/18/1972    Last attempt to quit: 10/24/2015    Years since quitting: 1.4  . Smokeless tobacco: Never Used  Substance and Sexual Activity  . Alcohol use: No    Alcohol/week: 0.0 oz  . Drug use: No  . Sexual activity: Not Currently  Other Topics Concern  . Not on file  Social History Narrative   Lives in [place] with wife.  Does not routinely exercise.  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     Allergies  Allergen Reactions  . Brilinta [Ticagrelor] Shortness Of Breath     _0 @  There were no vitals filed for this visit.   Gen: well appearing HENT: OP clear, TM's clear, neck supple PULM: CTA B, normal percussion CV: RRR, no mgr, trace edema GI: BS+, soft, nontender Derm: no cyanosis or rash Psyche: normal mood and affect   CT chest : reviewed again  showing a mass like density at the right upper lobe obstructing the orifice of the anterior segment of the RUL  CBC    Component Value Date/Time   WBC 15.7 (H) 03/26/2017 1537   WBC 13.8 (H) 10/29/2015 1145   RBC 4.98 03/26/2017 1537   RBC 5.03 10/29/2015 1145   HGB 14.2 03/26/2017 1537   HCT 42.7 03/26/2017 1537   PLT 354 03/26/2017 1537   MCV 86 03/26/2017 1537   MCH 28.5 03/26/2017 1537   MCH 30.2 10/29/2015 1145   MCHC 33.3 03/26/2017 1537   MCHC 33.6 10/29/2015 1145   RDW 14.1 03/26/2017 1537   LYMPHSABS 3.2 (H) 03/26/2017 1537   EOSABS 0.3 03/26/2017 1537   BASOSABS 0.1 03/26/2017 1537    Impression/Plan:  Right upper lobe mass: Plan bronchoscopy today for further evaluation   Roselie Awkward, MD [facility] Pager: [phone] Cell: [phone] After 3pm or if no response, call [phone]

## 2017-04-03 NOTE — Discharge Instructions (Signed)
Flexible Bronchoscopy, Care After These instructions give you information on caring for yourself after your procedure. Your doctor may also give you more specific instructions. Call your doctor if you have any problems or questions after your procedure. Follow these instructions at home:  Do not eat or drink anything for 2 hours after your procedure. If you try to eat or drink before the medicine wears off, food or drink could go into your lungs. You could also burn yourself.  After 2 hours have passed and when you can cough and gag normally, you may eat soft food and drink liquids slowly.  The day after the test, you may eat your normal diet.  You may do your normal activities.  Keep all doctor visits. Get help right away if:  You get more and more short of breath.  You get light-headed.  You feel like you are going to pass out (faint).  You have chest pain.  You have new problems that worry you.  You cough up more than a little blood.  You cough up more blood than before. This information is not intended to replace advice given to you by your health care provider. Make sure you discuss any questions you have with your health care provider. Document Released: 03/12/2009 Document Revised: 10/21/2015 Document Reviewed: 01/17/2013 Elsevier Interactive Patient Education  2017 [place].  Nothing to eat or drink until     5:15  pm today  04/02/2017

## 2017-04-04 ENCOUNTER — Encounter (HOSPITAL_COMMUNITY): Payer: Self-pay | Admitting: Pulmonary Disease

## 2017-04-04 LAB — GLUCOSE, CAPILLARY: Glucose-Capillary: 101 mg/dL — ABNORMAL HIGH (ref 65–99)

## 2017-04-05 ENCOUNTER — Encounter: Payer: Self-pay | Admitting: Internal Medicine

## 2017-04-05 ENCOUNTER — Encounter: Payer: Self-pay | Admitting: Pulmonary Disease

## 2017-04-05 ENCOUNTER — Ambulatory Visit (INDEPENDENT_AMBULATORY_CARE_PROVIDER_SITE_OTHER): Payer: Medicare Other | Admitting: Pulmonary Disease

## 2017-04-05 VITALS — BP 131/85 | HR 92 | Ht 71.0 in | Wt 208.0 lb

## 2017-04-05 DIAGNOSIS — I6523 Occlusion and stenosis of bilateral carotid arteries: Secondary | ICD-10-CM

## 2017-04-05 DIAGNOSIS — J432 Centrilobular emphysema: Secondary | ICD-10-CM

## 2017-04-05 DIAGNOSIS — C3491 Malignant neoplasm of unspecified part of right bronchus or lung: Secondary | ICD-10-CM

## 2017-04-05 NOTE — Patient Instructions (Signed)
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

## 2017-04-05 NOTE — Progress Notes (Signed)
Subjective:    Patient ID: Randy [name], male    DOB: 1945-06-16, 71 y.o.   MRN: [id number]  Synopsis: Referred in 2018 for evaluation of a right upper lobe mass and pneumonia.  Bronchoscopy performed in 03/2017 showed squamous cell carcinoma.   HPI Chief Complaint  Patient presents with  . Follow-up    review recent bronch results per BQ.     Randy [name] comes back to clinic today to go over the results from his bronchoscopy earlier this week.  He says that his cough resolved the night after the bronchoscopy.  He never needed to use the cough medicine.  His shortness of breath has been improving.    Past Medical History:  Diagnosis Date  . 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 Repeat US due 2023  . Carotid arterial disease ([place])    a. S/p R CEA;  b. 05/2015 Carotid U/S: RICA 7-09%, LICA 62-83%.  . Coronary artery disease    a. 2012 MI in [place] w/ PCI  //  b. 04/2013 MV: inflat scar, no ischemia.  //  c.   NSTEMI 5/17 >> Promus DES to LCx and POBA to OM1 (ISR).   . Diabetes mellitus without complication ([place])   . Grade II diastolic dysfunction 66/29/4765  . History of cardiac catheterization 10/21/15   a. NSTEMI 5/17: oLAD 40, pLCx 90 (Promus DES), OM2 90 ISR (POBA), oRCA 25   . History of echocardiogram    a. Mild LVH, EF 50-55%, possible inferolateral HK, grade 2 diastolic dysfunction, trivial AI, MAC  . History of nuclear stress test 04/2013   a. Myoview 12/14: Intermediate risk, prior inferolateral MI, no ischemia, EF 45%  . Hx of leukocytosis   . Hyperlipidemia   . Hypertension   . Hypertensive heart disease   . Morbid obesity ([place])   . Tobacco abuse         Review of Systems  Constitutional: Negative for chills, fatigue and fever.  HENT: Negative for rhinorrhea, sinus pressure and sinus pain.   Respiratory: Positive for cough. Negative for shortness of breath and wheezing.   Cardiovascular: Negative for chest pain, palpitations and leg swelling.        Objective:   Physical Exam  Vitals:   04/05/17 1547  BP: 131/85  Pulse: 92  SpO2: 100%  Weight: 208 lb (94.3 kg)  Height: _0  (1.803 m)    Gen: well appearing HENT: OP clear, TM's clear, neck supple PULM: Wheeze RUL B, normal percussion CV: RRR, no mgr, trace edema GI: BS+, soft, nontender Derm: no cyanosis or rash Psyche: normal mood and affect   CBC    Component Value Date/Time   WBC 15.7 (H) 03/26/2017 1537   WBC 13.8 (H) 10/29/2015 1145   RBC 4.98 03/26/2017 1537   RBC 5.03 10/29/2015 1145   HGB 14.2 03/26/2017 1537   HCT 42.7 03/26/2017 1537   PLT 354 03/26/2017 1537   MCV 86 03/26/2017 1537   MCH 28.5 03/26/2017 1537   MCH 30.2 10/29/2015 1145   MCHC 33.3 03/26/2017 1537   MCHC 33.6 10/29/2015 1145   RDW 14.1 03/26/2017 1537   LYMPHSABS 3.2 (H) 03/26/2017 1537   EOSABS 0.3 03/26/2017 1537   BASOSABS 0.1 03/26/2017 1537   BMET    Component Value Date/Time   NA 138 10/20/2016 1508   K 5.1 10/20/2016 1508   CL 99 10/20/2016 1508   CO2 24 10/20/2016 1508   GLUCOSE 99 10/20/2016  1508   GLUCOSE 109 (H) 10/29/2015 1145   BUN 11 10/20/2016 1508   CREATININE 0.87 10/20/2016 1508   CALCIUM 9.5 10/20/2016 1508   GFRNONAA 87 10/20/2016 1508   GFRAA 100 10/20/2016 1508    Chest imaging: 02/2017 CT chest images reviewed: upper lobe predominant centrilobular emphsema mild, necrotizing RUQ mass  Pathology: November 2018 right upper lobe mass surgical biopsy consistent with squamous cell carcinoma, brushings also consistent with squamous cell carcinoma; sent for PDL-1 testing     Assessment & Plan:   Centrilobular emphysema ([place])  Squamous cell carcinoma of lung, right ([place])  Discussion: Unfortunately Randy [name] has a new diagnosis of squamous 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  This is due to prior tobacco use.  Today we discussed his diagnosis and his questions were answered.  I explained to him that the next steps will be performing  staging studies and then he will be seen in the thoracic oncology clinic.  I talked about the advancements and cancer treatment over the last 10 years and advised that some of the newer medications are quite effective.  As his disease appears to be localized to the right upper lobe we may consider surgical resection.  It should be noted that tumor did involve the orifice of the right upper lobe bronchus.  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  aspirin EC 81 MG tablet, Take 81 mg by mouth daily., Disp: , Rfl:  .  atorvastatin (LIPITOR) 40 MG tablet, Take 1 tablet (40 mg total) by mouth daily., Disp: 90 tablet, Rfl: 3 .  clopidogrel (PLAVIX) 75 MG tablet, Take 1 tablet (75 mg total) by mouth daily., Disp: 90 tablet, Rfl: 3 .  irbesartan (AVAPRO) 75 MG tablet, Take 75 mg by mouth daily as needed. , Disp: , Rfl:  .  metFORMIN (GLUCOPHAGE) 500 MG tablet, Take 0.5 tablets (250 mg total) by mouth 2 (two) times daily with a meal., Disp: 90 tablet, Rfl: 0 .  metoprolol succinate (TOPROL-XL) 25 MG 24 hr tablet, Take 12.5 mg by mouth daily., Disp: , Rfl:  .  VENTOLIN HFA 108 (90 Base) MCG/ACT inhaler, Inhale 1 puff every 4 (four) hours as needed into the lungs for wheezing or shortness of breath., Disp: 1 Inhaler, Rfl: 5 .  chlorpheniramine-HYDROcodone (TUSSIONEX PENNKINETIC ER) 10-8 MG/5ML SUER, Take 5 mLs every 12 (twelve) hours as needed by mouth for cough. (Patient not taking: Reported on 04/05/2017), Disp: 140 mL, Rfl: 0

## 2017-04-06 ENCOUNTER — Encounter: Payer: Self-pay | Admitting: *Deleted

## 2017-04-06 DIAGNOSIS — R918 Other nonspecific abnormal finding of lung field: Secondary | ICD-10-CM

## 2017-04-06 NOTE — Progress Notes (Signed)
Oncology Nurse Navigator Documentation  Oncology Nurse Navigator Flowsheets 04/06/2017  Referral date to RadOnc/MedOnc 04/06/2017  Navigator Encounter Type Other/I received referral on MR. [place].  Looks like early stage lung cancer. PET and brain scan are on 11/19. Per Dr. Kathee Delton note stating possible surgical resection, I completed referral to T surgery. I will also update T surgery office on referral.   Treatment Phase Pre-Tx/Tx Discussion  Barriers/Navigation Needs Coordination of Care  Interventions Coordination of Care  Coordination of Care Other  Acuity Level 1  Time Spent with Patient 30

## 2017-04-09 ENCOUNTER — Ambulatory Visit (HOSPITAL_COMMUNITY): Payer: Medicare Other

## 2017-04-12 ENCOUNTER — Ambulatory Visit: Payer: Medicare Other | Admitting: Adult Health

## 2017-04-12 ENCOUNTER — Telehealth: Payer: Self-pay | Admitting: Pulmonary Disease

## 2017-04-12 ENCOUNTER — Telehealth: Payer: Self-pay | Admitting: Internal Medicine

## 2017-04-12 ENCOUNTER — Encounter: Payer: Self-pay | Admitting: Internal Medicine

## 2017-04-12 NOTE — Telephone Encounter (Signed)
Patient states that he has been taking plavix for a little over a year after his stents and wanted to know if he should still be on Plavix?

## 2017-04-12 NOTE — Telephone Encounter (Signed)
Per last OV note in May:  ASSESSMENT AND PLAN:  1  CAD No symptoms of angina    I would keep on same regimen  Will review length of DAPT Rx     Will route to Dr. Harrington Challenger for further recommendation on how long patient will remain on Plavix.

## 2017-04-12 NOTE — Telephone Encounter (Signed)
I forwarded note to pt that I would stay on plavix given that he had instent restenosis

## 2017-04-13 ENCOUNTER — Encounter (HOSPITAL_COMMUNITY): Payer: Self-pay

## 2017-04-13 NOTE — Telephone Encounter (Signed)
Spoke with patient who stated that he noticed the blood in his mucus after a "hard coughing" spell. Since calling yesterday, patient stated that he has not noticed any more blood in his mucus. He denied any symptoms of SOB or chest pain.   I advised patient to call us back if the blood returns or he starts to develop symptoms. Patient verbalized understanding. Nothing else needed at time of call.

## 2017-04-16 ENCOUNTER — Encounter (HOSPITAL_COMMUNITY)
Admission: RE | Admit: 2017-04-16 | Discharge: 2017-04-16 | Disposition: A | Discharge status: Home / Self Care | Payer: Medicare Other | Source: Ambulatory Visit | Attending: Pulmonary Disease | Admitting: Pulmonary Disease

## 2017-04-16 ENCOUNTER — Other Ambulatory Visit (HOSPITAL_COMMUNITY): Payer: Medicare Other

## 2017-04-16 ENCOUNTER — Ambulatory Visit (HOSPITAL_COMMUNITY)
Admission: RE | Admit: 2017-04-16 | Discharge: 2017-04-16 | Disposition: A | Discharge status: Home / Self Care | Payer: Medicare Other | Source: Ambulatory Visit | Attending: Pulmonary Disease | Admitting: Pulmonary Disease

## 2017-04-16 DIAGNOSIS — N200 Calculus of kidney: Secondary | ICD-10-CM | POA: Diagnosis not present

## 2017-04-16 DIAGNOSIS — C3491 Malignant neoplasm of unspecified part of right bronchus or lung: Secondary | ICD-10-CM | POA: Diagnosis not present

## 2017-04-16 DIAGNOSIS — J189 Pneumonia, unspecified organism: Secondary | ICD-10-CM | POA: Diagnosis not present

## 2017-04-16 DIAGNOSIS — R918 Other nonspecific abnormal finding of lung field: Secondary | ICD-10-CM | POA: Diagnosis not present

## 2017-04-16 DIAGNOSIS — I700 Atherosclerosis of aorta: Secondary | ICD-10-CM | POA: Diagnosis not present

## 2017-04-16 DIAGNOSIS — R590 Localized enlarged lymph nodes: Secondary | ICD-10-CM | POA: Diagnosis not present

## 2017-04-16 DIAGNOSIS — I2510 Atherosclerotic heart disease of native coronary artery without angina pectoris: Secondary | ICD-10-CM | POA: Diagnosis not present

## 2017-04-16 DIAGNOSIS — G9389 Other specified disorders of brain: Secondary | ICD-10-CM | POA: Diagnosis not present

## 2017-04-16 DIAGNOSIS — C3490 Malignant neoplasm of unspecified part of unspecified bronchus or lung: Secondary | ICD-10-CM | POA: Diagnosis not present

## 2017-04-16 LAB — GLUCOSE, CAPILLARY: GLUCOSE-CAPILLARY: 123 mg/dL — AB (ref 65–99)

## 2017-04-16 LAB — POCT I-STAT CREATININE: CREATININE: 0.7 mg/dL (ref 0.61–1.24)

## 2017-04-16 MED ORDER — GADOBENATE DIMEGLUMINE 529 MG/ML IV SOLN
20.0000 mL | Freq: Once | INTRAVENOUS | Status: AC | PRN
  Administered 2017-04-16: 19 mL via INTRAVENOUS

## 2017-04-16 MED ORDER — FLUDEOXYGLUCOSE F - 18 (FDG) INJECTION
10.1700 | Freq: Once | INTRAVENOUS | Status: AC | PRN
  Administered 2017-04-16: 10.17 via INTRAVENOUS

## 2017-04-18 ENCOUNTER — Institutional Professional Consult (permissible substitution) (INDEPENDENT_AMBULATORY_CARE_PROVIDER_SITE_OTHER): Payer: Medicare Other | Admitting: Thoracic Surgery (Cardiothoracic Vascular Surgery)

## 2017-04-18 ENCOUNTER — Encounter: Payer: Self-pay | Admitting: Thoracic Surgery (Cardiothoracic Vascular Surgery)

## 2017-04-18 ENCOUNTER — Other Ambulatory Visit: Payer: Self-pay

## 2017-04-18 VITALS — BP 118/76 | HR 89 | Resp 16 | Ht 71.0 in | Wt 207.0 lb

## 2017-04-18 DIAGNOSIS — I6523 Occlusion and stenosis of bilateral carotid arteries: Secondary | ICD-10-CM

## 2017-04-18 DIAGNOSIS — C3411 Malignant neoplasm of upper lobe, right bronchus or lung: Secondary | ICD-10-CM | POA: Diagnosis not present

## 2017-04-18 NOTE — Progress Notes (Signed)
PCP is [name], PA-C Referring Provider is [name], MD  Chief Complaint  Patient presents with  . 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  He presented with a complaint of a chronic persistent cough.  He saw Sherlie Ban and had a chest x-ray.  There was concern for pneumonia and he was treated with antibiotics and steroids.  A CT of the chest was ordered and it showed a cavitary right upper lobe mass.  He was referred to Dr. Lake Bells.  Bronchoscopy showed an endobronchial mass in the right upper lobe.  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  He has had a couple of episodes of hemoptysis but none recently.  He says that he can walk about half a mile without getting short of breath but his wife questions that.  He does get winded walking up a flight of stairs.  He is not having any chest pain, pressure, or tightness at rest or with exertion.  He has not had any headaches or visual changes.  He has had a 21 pound weight loss over the past 3 months.  Zubrod Score: At the time of surgery this patient's most appropriate activity status/level should be described as: _0     0    Normal activity, no symptoms _1     1    Restricted in physical strenuous activity but ambulatory, able to do out light work _2     2    Ambulatory and capable of self care, unable to do work activities, up and about >50 % of waking hours                              _3     3    Only limited self care, in bed greater than 50% of waking hours _4     4    Completely disabled, no self care, confined to bed or chair _5     5     Moribund  Past Medical History:  Diagnosis Date  . 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 Repeat US due 2023  . Carotid arterial disease ([place])    a. S/p R CEA;  b. 05/2015 Carotid U/S: RICA 4-74%, LICA 25-95%.  . Coronary artery disease    a. 2012 MI in [place] w/ PCI  //  b. 04/2013 MV: inflat scar, no ischemia.  //  c.   NSTEMI 5/17 >> Promus DES to LCx and POBA to OM1 (ISR).   . Diabetes mellitus without complication ([place])   . Grade II diastolic dysfunction 63/87/5643  . History of cardiac catheterization 10/21/15   a. NSTEMI 5/17: oLAD 40, pLCx 90 (Promus DES), OM2 90 ISR (POBA), oRCA 25   . History of echocardiogram    a. Mild LVH, EF 50-55%, possible inferolateral HK, grade 2 diastolic dysfunction, trivial AI, MAC  . History of nuclear stress test 04/2013   a. Myoview 12/14: Intermediate risk, prior inferolateral MI, no ischemia, EF 45%  . Hx of leukocytosis   . Hyperlipidemia   . Hypertension   . Hypertensive heart disease   . Morbid obesity ([place])   .  Tobacco abuse     Past Surgical History:  Procedure Laterality Date  . CARDIAC CATHETERIZATION    . CARDIAC CATHETERIZATION N/A 10/21/2015   Procedure: Left Heart Cath and Coronary Angiography;  Surgeon: [name], MD;  Location: [facility];  Service: Cardiovascular;  Laterality: N/A;  . CARDIAC CATHETERIZATION N/A 10/21/2015   Procedure: Coronary Stent Intervention;  Surgeon: [name], MD;  Location: [facility];  Service: Cardiovascular;  Laterality: N/A;  . CARDIAC CATHETERIZATION N/A 10/21/2015   Procedure: Coronary Balloon Angioplasty;  Surgeon: [name], MD;  Location: [facility];  Service: Cardiovascular;  Laterality: N/A;  . CAROTID ENDARTERECTOMY    . CORONARY ANGIOPLASTY    . CORONARY STENT PLACEMENT    . VIDEO BRONCHOSCOPY Bilateral 04/03/2017   Procedure: VIDEO BRONCHOSCOPY WITH FLUORO;  Surgeon: [name], MD;  Location: [facility];   Service: Cardiopulmonary;  Laterality: Bilateral;    Family History  Problem Relation Age of Onset  . Other Mother        died @ 29 - complications following surgery.  . Hypertension Father        died in [place] @ 47    Social History Social History   Tobacco Use  . Smoking status: Former Smoker    Packs/day: 0.75    Years: 40.00    Pack years: 30.00    Types: Cigarettes    Start date: 06/18/1972    Last attempt to quit: 10/24/2015    Years since quitting: 1.4  . Smokeless tobacco: Never Used  Substance Use Topics  . Alcohol use: No    Alcohol/week: 0.0 oz  . Drug use: No    Current Outpatient Medications  Medication Sig Dispense Refill  . aspirin EC 81 MG tablet Take 81 mg by mouth daily.    [name] atorvastatin (LIPITOR) 40 MG tablet Take 1 tablet (40 mg total) by mouth daily. 90 tablet 3  . clopidogrel (PLAVIX) 75 MG tablet Take 1 tablet (75 mg total) by mouth daily. 90 tablet 3  . irbesartan (AVAPRO) 75 MG tablet Take 75 mg by mouth daily as needed.     . metFORMIN (GLUCOPHAGE) 500 MG tablet Take 0.5 tablets (250 mg total) by mouth 2 (two) times daily with a meal. 90 tablet 0  . metoprolol succinate (TOPROL-XL) 25 MG 24 hr tablet Take 12.5 mg by mouth daily.    . VENTOLIN HFA 108 (90 Base) MCG/ACT inhaler Inhale 1 puff every 4 (four) hours as needed into the lungs for wheezing or shortness of breath. 1 Inhaler 5  . chlorpheniramine-HYDROcodone (TUSSIONEX PENNKINETIC ER) 10-8 MG/5ML SUER Take 5 mLs every 12 (twelve) hours as needed by mouth for cough. (Patient not taking: Reported on 04/05/2017) 140 mL 0   No current facility-administered medications for this visit.     Allergies  Allergen Reactions  . Brilinta [Ticagrelor] Shortness Of Breath    Review of Systems  Constitutional: Positive for activity change, fatigue and unexpected weight change (Lost 21 pounds in 3 months). Negative for appetite change.  HENT: Negative for trouble swallowing and voice change.   Eyes:  Negative for visual disturbance.  Respiratory: Positive for cough, shortness of breath and wheezing (Occasional).   Cardiovascular: Negative for chest pain and leg swelling.  Gastrointestinal: Negative for abdominal pain and blood in stool.  Genitourinary: Negative for difficulty urinating and dysuria.  Musculoskeletal: Negative for arthralgias and myalgias.  Neurological: Negative for seizures, speech difficulty and weakness.  Hematological: Negative for adenopathy. Bruises/bleeds easily.  All other systems reviewed and are negative.   BP 118/76 (BP Location: Left Arm, Patient Position: Sitting, Cuff Size: Large)   Pulse 89   Resp 16   Ht _0  (1.803 m)   Wt 207 lb (93.9 kg)   SpO2 98% Comment: ON RA  BMI 28.87 kg/m  Physical Exam  Constitutional: He is oriented to person, place, and time. He appears well-developed and well-nourished. No distress.  HENT:  Head: Normocephalic and atraumatic.  Mouth/Throat: No oropharyngeal exudate.  Eyes: Conjunctivae and EOM are normal. No scleral icterus.  Neck: Neck supple. No thyromegaly present.  Cardiovascular: Normal rate, regular rhythm, normal heart sounds and intact distal pulses. Exam reveals no gallop and no friction rub.  No murmur heard. Pulmonary/Chest: Effort normal. No respiratory distress. He has no wheezes.  Diminished breath sounds right apex  Abdominal: Soft. He exhibits no distension. There is no tenderness.  Musculoskeletal: He exhibits no edema.  Lymphadenopathy:    He has no cervical adenopathy.  Neurological: He is alert and oriented to person, place, and time. No cranial nerve deficit.  Motor grossly intact  Skin: Skin is warm and dry.  Vitals reviewed.    Diagnostic Tests: CT CHEST WITHOUT CONTRAST  TECHNIQUE: Multidetector CT imaging of the chest was performed following the standard protocol without IV contrast.  COMPARISON:  None.  FINDINGS: Cardiovascular: The heart size is normal. Aortic  atherosclerosis. Calcifications within the left main coronary artery, LAD, left circumflex and RCA. No pericardial effusion  Mediastinum/Nodes: The trachea appears patent and is midline. Normal appearance of the esophagus. Lymph nodes. No hilar adenopathy. No axillary or supraclavicular adenopathy.  Lungs/Pleura: No pleural effusion. Moderate changes of emphysema. There is a cavitary lung mass within the central right upper lobe measuring 3.3 cm, image 157 of series 5 there is complete occlusion of the right upper lobe bronchus. Postobstructive pneumonitis within the right upper lobe is identified.  Upper Abdomen: No acute abnormality.  Musculoskeletal: There is degenerative disc disease identified within the thoracic spine. No aggressive lytic or sclerotic bone lesions.  IMPRESSION: 1. There is a central right upper lobe cavitary lung mass with associated postobstructive pneumonitis. If there are signs or symptoms of infection then it is conceivable that this could represent an necrotizing pneumonia. However, the diagnosis of exclusion in this patient who has a long-standing smoking history and evidence of smoking related changes in both lungs would be pulmonary neoplasm. Management strategies include antibiotic therapy if there are signs or symptoms of pneumonia with follow-up imaging to ensure complete resolution. If there is no evidence for pneumonia then further investigation with PET-CT and tissue sampling is recommended. 2. Aortic Atherosclerosis (ICD10-I70.0). Three vessel coronary artery calcifications noted. These results will be called to the ordering clinician or representative by the Radiologist Assistant, and communication documented in the PACS or zVision Dashboard.   Electronically Signed   By: Kerby Moors M.D.   On: 03/13/2017 16:31 NUCLEAR MEDICINE PET SKULL BASE TO THIGH  TECHNIQUE: 3.2 mCi F-18 FDG was injected intravenously. Full-ring PET  imaging was performed from the skull base to thigh after the radiotracer. CT data was obtained and used for attenuation correction and anatomic localization.  FASTING BLOOD GLUCOSE:  Value: 126 mg/dl  COMPARISON:  CT 03/13/2017  FINDINGS: NECK: No areas of abnormal hypermetabolism. Bilateral carotid atherosclerosis. No cervical adenopathy.  CHEST: Hypermetabolic central right upper lobe lung mass which measures 5.0 cm and a S.U.V. max of 22.2,  including on image 30/series a 8. This is similar in size to on the prior diagnostic CT.  Surrounding presumed postobstructive pneumonitis. A more nodular hypermetabolic component laterally including at a S.U.V. max of 3.6 on image 27/ series 8 could represent tumor extension or postobstructive pneumonitis.  A focus of right hilar hypermetabolism measures a S.U.V. max of 4.5, including on image 76/series 4. This is favored to correspond to a node on image 162/series 3 of the prior diagnostic CT  Right mediastinal node measures 7 mm and a S.U.V. max of 3.0 on image 74/series 4.  Multivessel coronary artery atherosclerosis. Tiny hiatal hernia. Centrilobular and paraseptal emphysema. Scattered pulmonary nodules, below PET resolution. These are grossly similar and nonspecific. Example in the right middle lobe at 6 mm on image 45/series 8.  ABDOMEN/PELVIS: No abdominopelvic parenchymal or nodal hypermetabolism. Punctate right renal collecting system calculi. Normal adrenal glands. Abdominal aortic and branch vessel atherosclerosis is advanced. Mild prostatomegaly.  SKELETON: No abnormal marrow activity.  IMPRESSION: 1. Hypermetabolic right upper lobe lung mass, consistent with primary bronchogenic carcinoma. Surrounding postobstructive pneumonitis. 2. Probable right hilar/infrahilar nodal metastasis. Equivocal activity within a non pathologically enlarged right mediastinum. 3. No evidence of extrathoracic metastatic  disease. 4. Nonspecific pulmonary nodules. 5. Coronary artery atherosclerosis. Aortic Atherosclerosis (ICD10-I70.0). 6. Right nephrolithiasis.   Electronically Signed   By: Abigail Miyamoto M.D.   On: 04/16/2017 10:04 I personally reviewed the CT and PET/CT images and concur with the findings noted above  Impression: Randy [name] is a 71 year old gentleman with a history of tobacco abuse who has had a persistent cough with intermittent hemoptysis recently.  He was found to have a cavitary right upper lobe mass that is biopsy-proven squamous cell carcinoma.  His imaging shows an elevated right hemidiaphragm and there is mediastinal invasion.  There also appears to be involvement of the hilar node and probably a subcarinal node as well.  He has at least T4 N1 and possible T4N2 disease.  He therefore is either stage IIIA or stage IIIB.  Surgical resection is not an option given the mediastinal invasion.  The best option would be to treat him with combined chemoradiation.  If he has a dramatic response we could potentially consider surgery after chemoradiation, but my guess is it will not be useful in his situation.  We would also have to address his cardiorespiratory status before we consider surgical resection.  Coronary artery disease with multiple stents-followed by Dr. [name].  Is on aspirin and Plavix.  Does have an in-stent restenosis.  Tobacco abuse-quit 2 years ago.  Plan: Referral to multiuse perithoracic oncology clinic to meet with radiation oncology and hematology oncology.  Melrose Nakayama, MD [facility] [phone]

## 2017-04-23 ENCOUNTER — Telehealth: Payer: Self-pay | Admitting: *Deleted

## 2017-04-23 DIAGNOSIS — R918 Other nonspecific abnormal finding of lung field: Secondary | ICD-10-CM

## 2017-04-23 NOTE — Telephone Encounter (Signed)
Oncology Nurse Navigator Documentation  Oncology Nurse Navigator Flowsheets 04/23/2017  Navigator Location [facility]  Navigator Encounter Type Telephone/I received referral on Mr. Woodhead today. I updated Dr. [name]. He would like to see patient tomorrow. I called and scheduled patient to be seen tomorrow.   Telephone Outgoing Call  Treatment Phase Pre-Tx/Tx Discussion  Barriers/Navigation Needs Coordination of Care  Interventions Coordination of Care  Coordination of Care Appts  Acuity Level 2  Time Spent with Patient 30

## 2017-04-24 ENCOUNTER — Other Ambulatory Visit (HOSPITAL_BASED_OUTPATIENT_CLINIC_OR_DEPARTMENT_OTHER): Payer: Medicare Other

## 2017-04-24 ENCOUNTER — Ambulatory Visit (HOSPITAL_BASED_OUTPATIENT_CLINIC_OR_DEPARTMENT_OTHER): Payer: Medicare Other | Admitting: Internal Medicine

## 2017-04-24 ENCOUNTER — Encounter: Payer: Self-pay | Admitting: *Deleted

## 2017-04-24 ENCOUNTER — Telehealth: Payer: Self-pay | Admitting: Internal Medicine

## 2017-04-24 ENCOUNTER — Encounter: Payer: Self-pay | Admitting: Internal Medicine

## 2017-04-24 VITALS — BP 124/68 | HR 73 | Temp 98.5°F | Resp 20 | Ht 71.0 in | Wt 207.5 lb

## 2017-04-24 DIAGNOSIS — C3411 Malignant neoplasm of upper lobe, right bronchus or lung: Secondary | ICD-10-CM

## 2017-04-24 DIAGNOSIS — Z7189 Other specified counseling: Secondary | ICD-10-CM

## 2017-04-24 DIAGNOSIS — R918 Other nonspecific abnormal finding of lung field: Secondary | ICD-10-CM

## 2017-04-24 DIAGNOSIS — C3491 Malignant neoplasm of unspecified part of right bronchus or lung: Secondary | ICD-10-CM

## 2017-04-24 DIAGNOSIS — Z5111 Encounter for antineoplastic chemotherapy: Secondary | ICD-10-CM

## 2017-04-24 LAB — CBC WITH DIFFERENTIAL/PLATELET
BASO%: 1 % (ref 0.0–2.0)
BASOS ABS: 0.1 10*3/uL (ref 0.0–0.1)
EOS%: 1.7 % (ref 0.0–7.0)
Eosinophils Absolute: 0.2 10*3/uL (ref 0.0–0.5)
HEMATOCRIT: 40.7 % (ref 38.4–49.9)
HGB: 13.1 g/dL (ref 13.0–17.1)
LYMPH%: 19.3 % (ref 14.0–49.0)
MCH: 27.3 pg (ref 27.2–33.4)
MCHC: 32.2 g/dL (ref 32.0–36.0)
MCV: 84.8 fL (ref 79.3–98.0)
MONO#: 1.1 10*3/uL — ABNORMAL HIGH (ref 0.1–0.9)
MONO%: 9.3 % (ref 0.0–14.0)
NEUT#: 8 10*3/uL — ABNORMAL HIGH (ref 1.5–6.5)
NEUT%: 68.7 % (ref 39.0–75.0)
PLATELETS: 315 10*3/uL (ref 140–400)
RBC: 4.8 10*6/uL (ref 4.20–5.82)
RDW: 14.6 % (ref 11.0–14.6)
WBC: 11.6 10*3/uL — ABNORMAL HIGH (ref 4.0–10.3)
lymph#: 2.2 10*3/uL (ref 0.9–3.3)

## 2017-04-24 LAB — COMPREHENSIVE METABOLIC PANEL
ALBUMIN: 3.3 g/dL — AB (ref 3.5–5.0)
ALK PHOS: 59 U/L (ref 40–150)
ALT: 12 U/L (ref 0–55)
ANION GAP: 11 meq/L (ref 3–11)
AST: 13 U/L (ref 5–34)
BILIRUBIN TOTAL: 0.65 mg/dL (ref 0.20–1.20)
BUN: 8.2 mg/dL (ref 7.0–26.0)
CALCIUM: 9.7 mg/dL (ref 8.4–10.4)
CO2: 23 mEq/L (ref 22–29)
CREATININE: 0.8 mg/dL (ref 0.7–1.3)
Chloride: 102 mEq/L (ref 98–109)
EGFR: >60 mL/min/{1.73_m2} (ref >60)
Glucose: 97 mg/dl (ref 70–140)
Potassium: 4.4 mEq/L (ref 3.5–5.1)
Sodium: 136 mEq/L (ref 136–145)
TOTAL PROTEIN: 7.9 g/dL (ref 6.4–8.3)

## 2017-04-24 NOTE — Telephone Encounter (Signed)
Gave avs and calendar for December and January 2019 °

## 2017-04-24 NOTE — Progress Notes (Signed)
[place] [phone]   [phone]  CONSULT NOTE  REFERRING PHYSICIAN: Dr. Modesto Charon  REASON FOR CONSULTATION:  71 years old white male recently diagnosed with lung cancer.  HPI Randy [name] is a 71 y.o. male with past medical history significant for hypertension, diabetes mellitus, coronary artery disease, dyslipidemia, moderate obesity, carotid artery disease as well as long history of smoking.  The patient mentioned that since September 2018 he has been complaining of chronic cough that was getting worse.  He was seen by his primary care physician and a chest x-ray on March 12, 2017 showed right upper lobe opacities concerning for pneumonia.  CT screening of the chest was performed on March 13, 2017 and it showed a central right upper lobe cavitary lung mass with associated postobstructive pneumonitis.  Suspicious for necrotizing pneumonia versus neoplasm.  The patient was a started on treatment with antibiotics and steroids.  He has no improvement in his symptoms.  He was seen by Dr. Lake Bells and underwent bronchoscopy on April 03, 2017 and the final pathology [id number]) was consistent with a squamous cell carcinoma.  PDL 1 expression was performed and it was negative.  A PET scan was performed on April 16, 2017 and it showed hypermetabolic central right upper lobe lung mass measured 5.0 cm with SUV max of 22.2.  There was also presumed postobstructive pneumonitis.  The scan also showed a focus of right hilar hypermetabolism with SUV max of 4.5.  There was also right mediastinal node measures 0.7 cm with SUV max of 3.0.  MRI of the brain performed in the same day showed no evidence of metastatic disease to the brain. The patient was seen by Dr. Roxan Hockey for consideration of surgical resection but because of his advanced disease he is not a good surgical candidate for resection. Dr. Roxan Hockey kindly refer the patient to me today for  evaluation and recommendation regarding treatment of his condition. When seen today the patient is fine except for fatigue.  He also has cough productive of whitish mucus and shortness of breath with exertion but no significant chest pain or hemoptysis.  He denied having any nausea, vomiting, diarrhea or constipation.  He intentionally lost around 20 pounds in the last few months with diet.  The patient denied having any headache or visual changes. Family history significant for mother died from pulmonary embolism after back surgery.  Father died at age 87 in a car accident. The patient is married and has 4 children.  He used to work as an Chief Financial Officer.  He has a history of smoking 1 pack/day for around 4 years and quit 18 months ago.  He has no history of alcohol or drug abuse. HPI  Past Medical History:  Diagnosis Date  . 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 Repeat US due 2023  . Carotid arterial disease ([place])    a. S/p R CEA;  b. 05/2015 Carotid U/S: RICA 6-56%, LICA 81-27%.  . Coronary artery disease    a. 2012 MI in [place] w/ PCI  //  b. 04/2013 MV: inflat scar, no ischemia.  //  c.   NSTEMI 5/17 >> Promus DES to LCx and POBA to OM1 (ISR).   . Diabetes mellitus without complication ([place])   . Grade II diastolic dysfunction 51/70/0174  . History of cardiac catheterization 10/21/15   a. NSTEMI 5/17: oLAD 40, pLCx 90 (Promus DES), OM2 90 ISR (POBA), oRCA 25   . History  of echocardiogram    a. Mild LVH, EF 50-55%, possible inferolateral HK, grade 2 diastolic dysfunction, trivial AI, MAC  . History of nuclear stress test 04/2013   a. Myoview 12/14: Intermediate risk, prior inferolateral MI, no ischemia, EF 45%  . Hx of leukocytosis   . Hyperlipidemia   . Hypertension   . Hypertensive heart disease   . Morbid obesity ([place])   . Tobacco abuse     Past Surgical History:  Procedure Laterality Date  . CARDIAC CATHETERIZATION    . CARDIAC CATHETERIZATION N/A 10/21/2015   Procedure:  Left Heart Cath and Coronary Angiography;  Surgeon: [name], MD;  Location: [facility];  Service: Cardiovascular;  Laterality: N/A;  . CARDIAC CATHETERIZATION N/A 10/21/2015   Procedure: Coronary Stent Intervention;  Surgeon: [name], MD;  Location: [facility];  Service: Cardiovascular;  Laterality: N/A;  . CARDIAC CATHETERIZATION N/A 10/21/2015   Procedure: Coronary Balloon Angioplasty;  Surgeon: [name], MD;  Location: [facility];  Service: Cardiovascular;  Laterality: N/A;  . CAROTID ENDARTERECTOMY    . CORONARY ANGIOPLASTY    . CORONARY STENT PLACEMENT    . VIDEO BRONCHOSCOPY Bilateral 04/03/2017   Procedure: VIDEO BRONCHOSCOPY WITH FLUORO;  Surgeon: [name], MD;  Location: [facility];  Service: Cardiopulmonary;  Laterality: Bilateral;    Family History  Problem Relation Age of Onset  . Other Mother        died @ 53 - complications following surgery.  . Hypertension Father        died in [place] @ 36    Social History Social History   Tobacco Use  . Smoking status: Former Smoker    Packs/day: 0.75    Years: 40.00    Pack years: 30.00    Types: Cigarettes    Start date: 06/18/1972    Last attempt to quit: 10/24/2015    Years since quitting: 1.5  . Smokeless tobacco: Never Used  Substance Use Topics  . Alcohol use: No    Alcohol/week: 0.0 oz  . Drug use: No    Allergies  Allergen Reactions  . Brilinta [Ticagrelor] Shortness Of Breath    Current Outpatient Medications  Medication Sig Dispense Refill  . aspirin EC 81 MG tablet Take 81 mg by mouth daily.    [name] atorvastatin (LIPITOR) 40 MG tablet Take 1 tablet (40 mg total) by mouth daily. 90 tablet 3  . chlorpheniramine-HYDROcodone (TUSSIONEX PENNKINETIC ER) 10-8 MG/5ML SUER Take 5 mLs every 12 (twelve) hours as needed by mouth for cough. (Patient not taking: Reported on 04/05/2017) 140 mL 0  . clopidogrel (PLAVIX) 75 MG tablet Take 1 tablet (75 mg total) by  mouth daily. 90 tablet 3  . irbesartan (AVAPRO) 75 MG tablet Take 75 mg by mouth daily as needed.     . metFORMIN (GLUCOPHAGE) 500 MG tablet Take 0.5 tablets (250 mg total) by mouth 2 (two) times daily with a meal. 90 tablet 0  . metoprolol succinate (TOPROL-XL) 25 MG 24 hr tablet Take 12.5 mg by mouth daily.    . VENTOLIN HFA 108 (90 Base) MCG/ACT inhaler Inhale 1 puff every 4 (four) hours as needed into the lungs for wheezing or shortness of breath. 1 Inhaler 5   No current facility-administered medications for this visit.     Review of Systems  Constitutional: positive for fatigue Eyes: negative Ears, nose, mouth, throat, and face: negative Respiratory: positive for cough, dyspnea on exertion and  sputum Cardiovascular: negative Gastrointestinal: negative Genitourinary:negative Integument/breast: negative Hematologic/lymphatic: negative Musculoskeletal:negative Neurological: negative Behavioral/Psych: negative Endocrine: negative Allergic/Immunologic: negative  Physical Exam  NGE:XBMWU, healthy, no distress, well nourished, well developed and anxious SKIN: skin color, texture, turgor are normal, no rashes or significant lesions HEAD: Normocephalic, No masses, lesions, tenderness or abnormalities EYES: normal, PERRLA, Conjunctiva are pink and non-injected EARS: External ears normal, Canals clear OROPHARYNX:no exudate, no erythema and lips, buccal mucosa, and tongue normal  NECK: supple, no adenopathy, no JVD LYMPH:  no palpable lymphadenopathy, no hepatosplenomegaly LUNGS: clear to auscultation , and palpation HEART: regular rate & rhythm, no murmurs and no gallops ABDOMEN:abdomen soft, non-tender, normal bowel sounds and no masses or organomegaly BACK: Back symmetric, no curvature., No CVA tenderness EXTREMITIES:no joint deformities, effusion, or inflammation, no edema, no skin discoloration  NEURO: alert & oriented x 3 with fluent speech, no focal motor/sensory  deficits  PERFORMANCE STATUS: ECOG 1  LABORATORY DATA: Lab Results  Component Value Date   WBC 11.6 (H) 04/24/2017   HGB 13.1 04/24/2017   HCT 40.7 04/24/2017   MCV 84.8 04/24/2017   PLT 315 04/24/2017      Chemistry      Component Value Date/Time   NA 136 04/24/2017 1115   K 4.4 04/24/2017 1115   CL 99 10/20/2016 1508   CO2 23 04/24/2017 1115   BUN 8.2 04/24/2017 1115   CREATININE 0.8 04/24/2017 1115      Component Value Date/Time   CALCIUM 9.7 04/24/2017 1115   ALKPHOS 59 04/24/2017 1115   AST 13 04/24/2017 1115   ALT 12 04/24/2017 1115   BILITOT 0.65 04/24/2017 1115       RADIOGRAPHIC STUDIES: Mr Jeri Cos XL Contrast  Result Date: 04/16/2017 CLINICAL DATA:  71 year old male with new diagnosis of non-small cell lung cancer. Staging. EXAM: MRI HEAD WITHOUT AND WITH CONTRAST TECHNIQUE: Multiplanar, multiecho pulse sequences of the brain and surrounding structures were obtained without and with intravenous contrast. CONTRAST:  44m MULTIHANCE GADOBENATE DIMEGLUMINE 529 MG/ML IV SOLN COMPARISON:  PET-CT today reported separately. FINDINGS: Brain: Multifocal chronic cortical encephalomalacia in the right parietal lobe including the sensory strip (series 6, images 19 and 16). Some associated subcortical white matter gliosis. No restricted diffusion or evidence of acute infarction. No midline shift, mass effect, or evidence of intracranial mass lesion. No abnormal enhancement identified. No dural thickening. No other cortical encephalomalacia identified. Scattered mild to moderate nonspecific mostly small foci of cerebral white matter T2 and FLAIR hyperintensity elsewhere. Probable tiny chronic lacunes in the cerebellum. No chronic cerebral blood products. No ventriculomegaly, extra-axial collection or acute intracranial hemorrhage. Cervicomedullary junction and pituitary are within normal limits. Vascular: Major intracranial vascular flow voids are preserved, the distal left  vertebral artery is dominant. Skull and upper cervical spine: Susceptibility artifact along the left frontal bone and scalp on series 3, image 14. Visualized bone marrow signal is within normal limits. Negative visible cervical spine and spinal cord. Sinuses/Orbits: Normal orbits soft tissues. Paranasal sinuses are clear. Other: Visible internal auditory structures appear normal. Mastoid air cells are clear. Asymmetric atrophy of the right parotid gland. Normal stylomastoid foramina. Other scalp and visible face soft tissues appear negative. IMPRESSION: 1.  No metastatic disease or acute intracranial abnormality. 2. Chronic right parietal lobe encephalomalacia suggesting prior posterior right MCA infarct. Tiny chronic cerebellar lacunes suspected. Otherwise mild for age nonspecific white matter signal changes Electronically Signed   By: HGenevie AnnM.D.   On: 04/16/2017 10:53   Nm  Pet Image Initial (pi) Skull Base To Thigh  Result Date: 04/16/2017 CLINICAL DATA:  Initial treatment strategy for staging of lung cancer. EXAM: NUCLEAR MEDICINE PET SKULL BASE TO THIGH TECHNIQUE: 3.2 mCi F-18 FDG was injected intravenously. Full-ring PET imaging was performed from the skull base to thigh after the radiotracer. CT data was obtained and used for attenuation correction and anatomic localization. FASTING BLOOD GLUCOSE:  Value: 126 mg/dl COMPARISON:  CT 03/13/2017 FINDINGS: NECK: No areas of abnormal hypermetabolism. Bilateral carotid atherosclerosis. No cervical adenopathy. CHEST: Hypermetabolic central right upper lobe lung mass which measures 5.0 cm and a S.U.V. max of 22.2, including on image 30/series a 8. This is similar in size to on the prior diagnostic CT. Surrounding presumed postobstructive pneumonitis. A more nodular hypermetabolic component laterally including at a S.U.V. max of 3.6 on image 27/ series 8 could represent tumor extension or postobstructive pneumonitis. A focus of right hilar hypermetabolism  measures a S.U.V. max of 4.5, including on image 76/series 4. This is favored to correspond to a node on image 162/series 3 of the prior diagnostic CT Right mediastinal node measures 7 mm and a S.U.V. max of 3.0 on image 74/series 4. Multivessel coronary artery atherosclerosis. Tiny hiatal hernia. Centrilobular and paraseptal emphysema. Scattered pulmonary nodules, below PET resolution. These are grossly similar and nonspecific. Example in the right middle lobe at 6 mm on image 45/series 8. ABDOMEN/PELVIS: No abdominopelvic parenchymal or nodal hypermetabolism. Punctate right renal collecting system calculi. Normal adrenal glands. Abdominal aortic and branch vessel atherosclerosis is advanced. Mild prostatomegaly. SKELETON: No abnormal marrow activity. IMPRESSION: 1. Hypermetabolic right upper lobe lung mass, consistent with primary bronchogenic carcinoma. Surrounding postobstructive pneumonitis. 2. Probable right hilar/infrahilar nodal metastasis. Equivocal activity within a non pathologically enlarged right mediastinum. 3. No evidence of extrathoracic metastatic disease. 4. Nonspecific pulmonary nodules. 5. Coronary artery atherosclerosis. Aortic Atherosclerosis (ICD10-I70.0). 6. Right nephrolithiasis. Electronically Signed   By: Abigail Miyamoto M.D.   On: 04/16/2017 10:04   Dg C-arm Bronchoscopy  Result Date: 04/03/2017 C-ARM BRONCHOSCOPY: Fluoroscopy was utilized by the requesting physician.  No radiographic interpretation.    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   PLAN: I had a lengthy discussion with the patient today about his current disease of stage, prognosis and  treatment options.  I personally and independently reviewed the scan images and discussed the results and showed the images to the patient today. The patient is not a good surgical candidate for resection. I recommended for the patient a course of concurrent chemoradiation with weekly carboplatin for [facility] of 2 1 paclitaxel 45 mg/M2 with radiation for 6-7 weeks followed by restaging scan and consideration of consolidation immunotherapy if he has no evidence of disease progression after the induction phase. I discussed with the patient the adverse effect of this treatment including but not limited to alopecia, myelosuppression, nausea and vomiting, peripheral neuropathy, liver or renal dysfunction. I will arrange for the patient to have a chemotherapy education class before starting the first dose of chemotherapy. I also referred the patient to radiation with concurrent radiotherapy. He is expected to start the first cycle of this treatment on May 07, 2017. I would call his pharmacy with prescription for Compazine 10 mg p.o.  every 6 hours as needed for nausea. The patient is interested in proceeding with this course of treatment. I will see him back for follow-up visit in 3 weeks for evaluation and management of this effect of his treatment. He was advised to call immediately if he has any concerning symptoms in the interval. The patient voices understanding of current disease status and treatment options and is in agreement with the current care plan. All questions were answered. The patient knows to call the clinic with any problems, questions or concerns. We can certainly see the patient much sooner if necessary. Thank you so much for allowing me to participate in the care of BACILIO ABASCAL. I will continue to follow up the patient with you and assist in his care.  I spent 55 minutes counseling the patient face to face. The total time spent in the appointment was 80 minutes.  Disclaimer: This  note was dictated with voice recognition software. Similar sounding words can inadvertently be transcribed and may not be corrected upon review.   Eilleen Kempf April 24, 2017, 12:06 PM

## 2017-04-24 NOTE — Progress Notes (Signed)
START ON PATHWAY REGIMEN - Non-Small Cell Lung     Administer weekly:     Paclitaxel      Carboplatin   **Always confirm dose/schedule in your pharmacy ordering system**    Patient Characteristics: Stage III - Unresectable, PS = 0, 1 AJCC T Category: T4 Current Disease Status: No Distant Mets or Local Recurrence AJCC N Category: N1 AJCC M Category: M0 AJCC 8 Stage Grouping: IIIA Performance Status: PS = 0, 1 Intent of Therapy: Curative Intent, Discussed with Patient

## 2017-04-24 NOTE — Progress Notes (Signed)
Oncology Nurse Navigator Documentation  Oncology Nurse Navigator Flowsheets 04/24/2017  Navigator Location [facility]  Navigator Encounter Type Clinic/MDC/I spoke with patient today at clinic. I gave and explained information on treatment plan and next steps.  I will notify Rad Onc on referral to help expedite.   Abnormal Finding Date 03/12/2017  Confirmed Diagnosis Date 04/03/2017  Patient Visit Type MedOnc  Treatment Phase Pre-Tx/Tx Discussion  Barriers/Navigation Needs Education;Coordination of Care  Education Newly Diagnosed Cancer Education;Other  Interventions Coordination of Care;Education  Coordination of Care Other  Education Method Verbal;Written  Acuity Level 2  Acuity Level 2 Educational needs;Assistance expediting appointments;Other  Time Spent with Patient 30

## 2017-04-25 ENCOUNTER — Encounter: Payer: Self-pay | Admitting: Radiation Oncology

## 2017-04-26 ENCOUNTER — Encounter: Payer: Self-pay | Admitting: Physician Assistant

## 2017-04-26 ENCOUNTER — Encounter: Payer: Self-pay | Admitting: Internal Medicine

## 2017-04-26 ENCOUNTER — Encounter: Payer: Self-pay | Admitting: Pulmonary Disease

## 2017-04-27 NOTE — Progress Notes (Signed)
Thoracic Location of Tumor / Histology: Right upper lung mass  Patient presented  months ago with symptoms of: Chronic cough since September getting worse  Biopsies of  (if applicable) revealed: Diagnosis 04/03/17: Lung, biopsy, mass, right upper lobe - SQUAMOUS CELL CARCINOMA.  Tobacco/Marijuana/Snuff/ETOH use: 1ppd x 40 years,quit 1.5 years ago, no alcohol or drug use, no smokeless  tobacco   Past/Anticipated interventions by cardiothoracic surgery, if any:   Past/Anticipated interventions by medical oncology, if any: Dr. [name] 04/24/17 appt: Chemo education 04/30/17, Chemotherapy  infusion on 05/07/17, each monday  Signs/Symptoms  Weight changes, if any: loss 3-5 lbs past 2 weeks  Respiratory complaints, if any: sob with exertion  Hemoptysis, if any: yes, last time 2 days ago 3x in his mucous  Pain issues, if any: NO  SAFETY ISSUES:  Prior radiation? no  Pacemaker/ICD? No  Is the patient on methotrexate? NO  Current Complaints / other details:  Married, Abdominal aortic ectasia  2.5 cm  03/23/17;, CAD, cardiac catheretization 10/21/15;  coronary stent placement , 1st 2 stents put in [place],  2012, DM, HTN, pneumonia, prior inferolateral MI, right MCA infarct   Mother deceased age 9 with  PE; after back surgery , Father deceased MVA  BP (!) 155/86   Pulse 78   Temp 97.8 F (36.6 C) (Oral)   Resp 20   Ht 5\' 11"  (1.803 m)   Wt 205 lb 12.8 oz (93.4 kg)   SpO2 100% Comment: room air  BMI 28.70 kg/m   Wt Readings from Last 3 Encounters:  05/01/17 205 lb 12.8 oz (93.4 kg)  04/24/17 207 lb 8 oz (94.1 kg)  04/18/17 207 lb (93.9 kg)

## 2017-04-29 ENCOUNTER — Other Ambulatory Visit: Payer: Self-pay | Admitting: Internal Medicine

## 2017-04-30 ENCOUNTER — Encounter: Payer: Self-pay | Admitting: Internal Medicine

## 2017-04-30 ENCOUNTER — Other Ambulatory Visit: Payer: Self-pay | Admitting: *Deleted

## 2017-04-30 ENCOUNTER — Other Ambulatory Visit: Payer: Medicare Other

## 2017-04-30 MED ORDER — PROCHLORPERAZINE MALEATE 10 MG PO TABS: 10.0000 mg | ORAL_TABLET | Freq: Four times a day (QID) | ORAL | 2 refills | Status: DC | PRN

## 2017-04-30 NOTE — Telephone Encounter (Signed)
Antiemetic sent to pt Pharmacy.

## 2017-05-01 ENCOUNTER — Telehealth: Payer: Self-pay | Admitting: Pulmonary Disease

## 2017-05-01 ENCOUNTER — Encounter: Payer: Self-pay | Admitting: Pulmonary Disease

## 2017-05-01 ENCOUNTER — Encounter: Payer: Self-pay | Admitting: General Practice

## 2017-05-01 ENCOUNTER — Ambulatory Visit
Admission: RE | Admit: 2017-05-01 | Discharge: 2017-05-01 | Disposition: A | Discharge status: Home / Self Care | Payer: Medicare Other | Source: Ambulatory Visit | Attending: Radiation Oncology | Admitting: Radiation Oncology

## 2017-05-01 ENCOUNTER — Encounter: Payer: Self-pay | Admitting: Radiation Oncology

## 2017-05-01 VITALS — BP 155/86 | HR 78 | Temp 97.8°F | Resp 20 | Ht 71.0 in | Wt 205.8 lb

## 2017-05-01 DIAGNOSIS — I2510 Atherosclerotic heart disease of native coronary artery without angina pectoris: Secondary | ICD-10-CM | POA: Diagnosis not present

## 2017-05-01 DIAGNOSIS — I252 Old myocardial infarction: Secondary | ICD-10-CM | POA: Diagnosis not present

## 2017-05-01 DIAGNOSIS — Z79899 Other long term (current) drug therapy: Secondary | ICD-10-CM | POA: Diagnosis not present

## 2017-05-01 DIAGNOSIS — C3411 Malignant neoplasm of upper lobe, right bronchus or lung: Secondary | ICD-10-CM

## 2017-05-01 DIAGNOSIS — Z7902 Long term (current) use of antithrombotics/antiplatelets: Secondary | ICD-10-CM | POA: Diagnosis not present

## 2017-05-01 DIAGNOSIS — Z888 Allergy status to other drugs, medicaments and biological substances status: Secondary | ICD-10-CM | POA: Diagnosis not present

## 2017-05-01 DIAGNOSIS — Z8249 Family history of ischemic heart disease and other diseases of the circulatory system: Secondary | ICD-10-CM | POA: Insufficient documentation

## 2017-05-01 DIAGNOSIS — I10 Essential (primary) hypertension: Secondary | ICD-10-CM | POA: Diagnosis not present

## 2017-05-01 DIAGNOSIS — Z510 Encounter for antineoplastic radiation therapy: Secondary | ICD-10-CM | POA: Insufficient documentation

## 2017-05-01 DIAGNOSIS — C3491 Malignant neoplasm of unspecified part of right bronchus or lung: Secondary | ICD-10-CM

## 2017-05-01 DIAGNOSIS — Z7952 Long term (current) use of systemic steroids: Secondary | ICD-10-CM | POA: Insufficient documentation

## 2017-05-01 DIAGNOSIS — E785 Hyperlipidemia, unspecified: Secondary | ICD-10-CM | POA: Diagnosis not present

## 2017-05-01 DIAGNOSIS — Z7984 Long term (current) use of oral hypoglycemic drugs: Secondary | ICD-10-CM | POA: Diagnosis not present

## 2017-05-01 DIAGNOSIS — Z87891 Personal history of nicotine dependence: Secondary | ICD-10-CM | POA: Diagnosis not present

## 2017-05-01 DIAGNOSIS — Z7982 Long term (current) use of aspirin: Secondary | ICD-10-CM | POA: Diagnosis not present

## 2017-05-01 DIAGNOSIS — E119 Type 2 diabetes mellitus without complications: Secondary | ICD-10-CM | POA: Diagnosis not present

## 2017-05-01 DIAGNOSIS — Z9889 Other specified postprocedural states: Secondary | ICD-10-CM | POA: Insufficient documentation

## 2017-05-01 DIAGNOSIS — I77811 Abdominal aortic ectasia: Secondary | ICD-10-CM | POA: Diagnosis not present

## 2017-05-01 MED ORDER — HYDROCOD POLST-CPM POLST ER 10-8 MG/5ML PO SUER: 5.0000 mL | Freq: Two times a day (BID) | ORAL | 0 refills | Status: DC | PRN

## 2017-05-01 NOTE — Telephone Encounter (Signed)
BQ please advise. Pt sent an email  Aharon, Carriere "[name]"  to [name], MD      05/01/17 9:56 AM  Dr. Lake Bells,  I have sent 3 emails thru my chart asking for a refill on the cough syurp you prescribed for me . I am out and try to get refilled. Pharmacy said i had to contact the doctor on this prescripition . I have called the office twice and i got my chart message a few days ago wanting to know what kind of syurp i am requesting . On your Medication list it was Tussionex.  Could you please have someone to call in or another replacement .  Regards,  Rudean Hitt

## 2017-05-01 NOTE — Telephone Encounter (Signed)
See phone note from 05/01/17.

## 2017-05-01 NOTE — Telephone Encounter (Signed)
Patient has a telephone encounter, sent to BQ for review.

## 2017-05-01 NOTE — Progress Notes (Signed)
[place] Psychosocial Distress Screening Clinical Social Work  Clinical Social Work was referred by distress screening protocol.  The patient scored a 7 on the Psychosocial Distress Thermometer which indicates moderate distress. Clinical Social Worker Edwyna Shell to assess for distress and other psychosocial needs. CSW met w patient in Radiation waiting room.  Pt reports he is somewhat "depressed" but states this is due to lack of information about overall treatment plan and prognosis "they say it's curable but we'll see."  Lives w wife and two teenage children (20 and 37); wife drives him to appointments and provides significant support.  Pt has told his children about diagnosis, treatment, prognosis; reports his most important concern is how his diagnosis impacts children.  "I know they are worried about me, I try to be honest."  CSW provided resources, validation of feelings, invited patient to contact CSW w ongoing concerns as treatment progresses, also offered supportive services for wife and children as needed.    ONCBCN DISTRESS SCREENING 05/01/2017  Screening Type Initial Screening  Distress experienced in past week (1-10) 7  Emotional problem type Adjusting to illness  Information Concerns Type Lack of info about treatment  Physical Problem type Sleep/insomnia;Skin dry/itchy  Physician notified of physical symptoms Yes  Referral to clinical psychology No  Referral to clinical social work Yes    Clinical Social Worker follow up needed: No.  If yes, follow up plan:  Edwyna Shell, LCSW Clinical Social Worker Phone:  [phone]

## 2017-05-01 NOTE — Progress Notes (Signed)
Radiation Oncology         [phone] ________________________________  Name: Randy [name]        MRN: [id number]  Date of Service: 05/01/2017 DOB: 1945/11/08  PP:IRJJOACZ, Elson Areas, PA-C  Curt Bears, MD     REFERRING PHYSICIAN: Curt Bears, MD   DIAGNOSIS: The primary encounter diagnosis was Adenocarcinoma of right lung, stage 3 ([place]). A diagnosis of Malignant neoplasm of right upper lobe of lung (HCC) was also pertinent to this visit.   HISTORY OF PRESENT ILLNESS: Randy [name] is a 71 y.o. male seen at the request of Dr. [name] with a recent diagnosis of lung cancer.  Patient has had a chronic cough ongoing since September 2018 seen by primary care provider, on March 12, 2017 a chest x-ray revealed right upper lobe opacity concerning for pneumonia and he was started on antibiotics and steroid therapy as well.   CT scan on 03/13/2017 revealed a central right upper lobe cavitary lung mass with postobstructive pneumonitis suspicious for pneumonia versus neoplasm.  He did not improved with his symptoms despite the antibiotics and steroids, and on 04/03/2017 underwent bronchoscopy with Dr. 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 On 04/16/17 he had a PET scan revealing, hypermetabolic right upper lobe lung mass measuring 5 cm, consistent with primary bronchogenic carcinoma as wekk as right hilar/ infrahilar nodal adenopathy. No metastatic disease was seen. Also on 04/16/17  a MRI Brain was performed and no metasattic diease or acute intracranial abnormality was seen.  He met with Dr. [name] to review the results, and plans to start chemotherapy next Monday.  He comes today to discuss the role of concurrent radiotherapy.  PREVIOUS RADIATION THERAPY: No   PAST MEDICAL HISTORY:  Past Medical History:  Diagnosis Date  . 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 Repeat US due 2023  . Carotid  arterial disease ([place])    a. S/p R CEA;  b. 05/2015 Carotid U/S: RICA 6-60%, LICA 63-01%.  . Coronary artery disease    a. 2012 MI in [place] w/ PCI  //  b. 04/2013 MV: inflat scar, no ischemia.  //  c.   NSTEMI 5/17 >> Promus DES to LCx and POBA to OM1 (ISR).   . Diabetes mellitus without complication ([place])   . Grade II diastolic dysfunction 60/02/9322  . History of cardiac catheterization 10/21/15   a. NSTEMI 5/17: oLAD 40, pLCx 90 (Promus DES), OM2 90 ISR (POBA), oRCA 25   . History of echocardiogram    a. Mild LVH, EF 50-55%, possible inferolateral HK, grade 2 diastolic dysfunction, trivial AI, MAC  . History of nuclear stress test 04/2013   a. Myoview 12/14: Intermediate risk, prior inferolateral MI, no ischemia, EF 45%  . Hx of leukocytosis   . Hyperlipidemia   . Hypertension   . Hypertensive heart disease   . Morbid obesity ([place])   . Tobacco abuse        PAST SURGICAL HISTORY: Past Surgical History:  Procedure Laterality Date  . CARDIAC CATHETERIZATION    . CARDIAC CATHETERIZATION N/A 10/21/2015   Procedure: Left Heart Cath and Coronary Angiography;  Surgeon: [name], MD;  Location: [facility];  Service: Cardiovascular;  Laterality: N/A;  . CARDIAC CATHETERIZATION N/A 10/21/2015   Procedure: Coronary Stent Intervention;  Surgeon: [name], MD;  Location: [facility];  Service: Cardiovascular;  Laterality: N/A;  . CARDIAC CATHETERIZATION N/A  10/21/2015   Procedure: Coronary Balloon Angioplasty;  Surgeon: [name], MD;  Location: [facility];  Service: Cardiovascular;  Laterality: N/A;  . CAROTID ENDARTERECTOMY    . CORONARY ANGIOPLASTY    . CORONARY STENT PLACEMENT    . VIDEO BRONCHOSCOPY Bilateral 04/03/2017   Procedure: VIDEO BRONCHOSCOPY WITH FLUORO;  Surgeon: [name], MD;  Location: [facility];  Service: Cardiopulmonary;  Laterality: Bilateral;     FAMILY HISTORY:  Family History  Problem Relation Age of  Onset  . Other Mother        died @ 67 - complications following surgery.  . Hypertension Father        died in MVA @ 69     SOCIAL HISTORY:  reports that he quit smoking about 18 months ago. His smoking use included cigarettes. He started smoking about 44 years ago. He has a 30.00 pack-year smoking history. he has never used smokeless tobacco. He reports that he does not drink alcohol or use drugs. He is married and lives in [place].  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 Speaks a little mandarin.   ALLERGIES: Brilinta [ticagrelor]   MEDICATIONS:  Current Outpatient Medications  Medication Sig Dispense Refill  . aspirin EC 81 MG tablet Take 81 mg by mouth daily.    [name] atorvastatin (LIPITOR) 40 MG tablet Take 1 tablet (40 mg total) by mouth daily. 90 tablet 3  . chlorpheniramine-HYDROcodone (TUSSIONEX PENNKINETIC ER) 10-8 MG/5ML SUER Take 5 mLs every 12 (twelve) hours as needed by mouth for cough. 140 mL 0  . clopidogrel (PLAVIX) 75 MG tablet Take 1 tablet (75 mg total) by mouth daily. 90 tablet 3  . irbesartan (AVAPRO) 75 MG tablet Take 75 mg by mouth daily as needed.     . metFORMIN (GLUCOPHAGE) 500 MG tablet Take 0.5 tablets (250 mg total) by mouth 2 (two) times daily with a meal. 90 tablet 0  . metoprolol succinate (TOPROL-XL) 25 MG 24 hr tablet Take 12.5 mg by mouth daily.    . VENTOLIN HFA 108 (90 Base) MCG/ACT inhaler Inhale 1 puff every 4 (four) hours as needed into the lungs for wheezing or shortness of breath. 1 Inhaler 5  . benzonatate (TESSALON) 200 MG capsule     . metoprolol succinate (TOPROL-XL) 25 MG 24 hr tablet TAKE 1/2 TABLETS BY MOUTH DAILY (Patient not taking: Reported on 05/01/2017) 45 tablet 1  . predniSONE (DELTASONE) 50 MG tablet     . prochlorperazine (COMPAZINE) 10 MG tablet Take 1 tablet (10 mg total) by mouth every 6 (six) hours as needed for nausea or vomiting. (Patient not taking: Reported on 05/01/2017) 30 tablet 2    No current facility-administered medications for this encounter.      REVIEW OF SYSTEMS: On review of systems, the patient reports that he is doing well overall. He  denies any chest pain, shortness of breath,  fevers, chills, night sweats.  He is noted about 20 pounds of weight loss in the last 6 months unintentionally, and reports that he has a chronic cough which is helped by Tussionex.  He requests a refill of this.  He  denies any bowel or bladder disturbances, and denies abdominal pain, nausea or vomiting. He denies any new musculoskeletal or joint aches or pains. A complete review of systems is obtained and is otherwise negative.   PHYSICAL EXAM:  Wt Readings from Last 3 Encounters:  05/01/17 205 lb 12.8  oz (93.4 kg)  04/24/17 207 lb 8 oz (94.1 kg)  04/18/17 207 lb (93.9 kg)   Temp Readings from Last 3 Encounters:  05/01/17 97.8 F (36.6 C) (Oral)  04/24/17 98.5 F (36.9 C) (Oral)  04/03/17 98.6 F (37 C) (Oral)   BP Readings from Last 3 Encounters:  05/01/17 (!) 155/86  04/24/17 124/68  04/18/17 118/76   Pulse Readings from Last 3 Encounters:  05/01/17 78  04/24/17 73  04/18/17 89   Pain Assessment Pain Score: 0-No pain/10  In general this is a well appearing he in no acute distress. He is alert and oriented x4 and appropriate throughout the examination. HEENT reveals that the patient is normocephalic, atraumatic. EOMs are intact. PERRLA. Skin is intact without any evidence of gross lesions. Cardiovascular exam reveals a regular rate and rhythm, no clicks rubs or murmurs are auscultated. Chest is clear to auscultation bilaterally. Lymphatic assessment is performed and does not reveal any adenopathy in the cervical, supraclavicular, axillary, or inguinal chains. Abdomen has active bowel sounds in all quadrants and is intact. The abdomen is soft, non tender, non distended. Lower extremities are negative for pretibial pitting edema, deep calf tenderness, cyanosis or  clubbing.    ECOG = 1  0 - Asymptomatic (Fully active, able to carry on all predisease activities without restriction)  1 - Symptomatic but completely ambulatory (Restricted in physically strenuous activity but ambulatory and able to carry out work of a light or sedentary nature. For example, light housework, office work)  2 - Symptomatic, <50% in bed during the day (Ambulatory and capable of all self care but unable to carry out any work activities. Up and about more than 50% of waking hours)  3 - Symptomatic, >50% in bed, but not bedbound (Capable of only limited self-care, confined to bed or chair 50% or more of waking hours)  4 - Bedbound (Completely disabled. Cannot carry on any self-care. Totally confined to bed or chair)  5 - Death   Eustace Pen MM, Creech RH, Tormey DC, et al. [phone]). "Toxicity and response criteria of the 2020 [facility] [employer]". [place] Oncol. 5 (6): 649-55    LABORATORY DATA:  Lab Results  Component Value Date   WBC 11.6 (H) 04/24/2017   HGB 13.1 04/24/2017   HCT 40.7 04/24/2017   MCV 84.8 04/24/2017   PLT 315 04/24/2017   Lab Results  Component Value Date   NA 136 04/24/2017   K 4.4 04/24/2017   CL 99 10/20/2016   CO2 23 04/24/2017   Lab Results  Component Value Date   ALT 12 04/24/2017   AST 13 04/24/2017   ALKPHOS 59 04/24/2017   BILITOT 0.65 04/24/2017      RADIOGRAPHY: Mr Jeri Cos EL Contrast  Result Date: 04/16/2017 CLINICAL DATA:  72 year old male with new diagnosis of non-small cell lung cancer. Staging. EXAM: MRI HEAD WITHOUT AND WITH CONTRAST TECHNIQUE: Multiplanar, multiecho pulse sequences of the brain and surrounding structures were obtained without and with intravenous contrast. CONTRAST:  20m MULTIHANCE GADOBENATE DIMEGLUMINE 529 MG/ML IV SOLN COMPARISON:  PET-CT today reported separately. FINDINGS: Brain: Multifocal chronic cortical encephalomalacia in the right parietal lobe including the sensory strip (series 6,  images 19 and 16). Some associated subcortical white matter gliosis. No restricted diffusion or evidence of acute infarction. No midline shift, mass effect, or evidence of intracranial mass lesion. No abnormal enhancement identified. No dural thickening. No other cortical encephalomalacia identified. Scattered mild to moderate nonspecific mostly small foci  of cerebral white matter T2 and FLAIR hyperintensity elsewhere. Probable tiny chronic lacunes in the cerebellum. No chronic cerebral blood products. No ventriculomegaly, extra-axial collection or acute intracranial hemorrhage. Cervicomedullary junction and pituitary are within normal limits. Vascular: Major intracranial vascular flow voids are preserved, the distal left vertebral artery is dominant. Skull and upper cervical spine: Susceptibility artifact along the left frontal bone and scalp on series 3, image 14. Visualized bone marrow signal is within normal limits. Negative visible cervical spine and spinal cord. Sinuses/Orbits: Normal orbits soft tissues. Paranasal sinuses are clear. Other: Visible internal auditory structures appear normal. Mastoid air cells are clear. Asymmetric atrophy of the right parotid gland. Normal stylomastoid foramina. Other scalp and visible face soft tissues appear negative. IMPRESSION: 1.  No metastatic disease or acute intracranial abnormality. 2. Chronic right parietal lobe encephalomalacia suggesting prior posterior right MCA infarct. Tiny chronic cerebellar lacunes suspected. Otherwise mild for age nonspecific white matter signal changes Electronically Signed   By: Genevie Ann M.D.   On: 04/16/2017 10:53   Nm Pet Image Initial (pi) Skull Base To Thigh  Result Date: 04/16/2017 CLINICAL DATA:  Initial treatment strategy for staging of lung cancer. EXAM: NUCLEAR MEDICINE PET SKULL BASE TO THIGH TECHNIQUE: 3.2 mCi F-18 FDG was injected intravenously. Full-ring PET imaging was performed from the skull base to thigh after the  radiotracer. CT data was obtained and used for attenuation correction and anatomic localization. FASTING BLOOD GLUCOSE:  Value: 126 mg/dl COMPARISON:  CT 03/13/2017 FINDINGS: NECK: No areas of abnormal hypermetabolism. Bilateral carotid atherosclerosis. No cervical adenopathy. CHEST: Hypermetabolic central right upper lobe lung mass which measures 5.0 cm and a S.U.V. max of 22.2, including on image 30/series a 8. This is similar in size to on the prior diagnostic CT. Surrounding presumed postobstructive pneumonitis. A more nodular hypermetabolic component laterally including at a S.U.V. max of 3.6 on image 27/ series 8 could represent tumor extension or postobstructive pneumonitis. A focus of right hilar hypermetabolism measures a S.U.V. max of 4.5, including on image 76/series 4. This is favored to correspond to a node on image 162/series 3 of the prior diagnostic CT Right mediastinal node measures 7 mm and a S.U.V. max of 3.0 on image 74/series 4. Multivessel coronary artery atherosclerosis. Tiny hiatal hernia. Centrilobular and paraseptal emphysema. Scattered pulmonary nodules, below PET resolution. These are grossly similar and nonspecific. Example in the right middle lobe at 6 mm on image 45/series 8. ABDOMEN/PELVIS: No abdominopelvic parenchymal or nodal hypermetabolism. Punctate right renal collecting system calculi. Normal adrenal glands. Abdominal aortic and branch vessel atherosclerosis is advanced. Mild prostatomegaly. SKELETON: No abnormal marrow activity. IMPRESSION: 1. Hypermetabolic right upper lobe lung mass, consistent with primary bronchogenic carcinoma. Surrounding postobstructive pneumonitis. 2. Probable right hilar/infrahilar nodal metastasis. Equivocal activity within a non pathologically enlarged right mediastinum. 3. No evidence of extrathoracic metastatic disease. 4. Nonspecific pulmonary nodules. 5. Coronary artery atherosclerosis. Aortic Atherosclerosis (ICD10-I70.0). 6. Right  nephrolithiasis. Electronically Signed   By: Abigail Miyamoto M.D.   On: 04/16/2017 10:04   Dg C-arm Bronchoscopy  Result Date: 04/03/2017 C-ARM BRONCHOSCOPY: Fluoroscopy was utilized by the requesting physician.  No radiographic interpretation.       IMPRESSION/PLAN: 1. Stage IIIA/B T4, N1-2, M0 NSCLC, squamous 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 Dr. Lisbeth Renshaw discusses the pathology findings and reviews the nature of locally advanced lung disease.He discusses the utility and guidelines of staging and treatment for stage III disease and recommends concurrent chemo with radiation. He anticipates starting treatment on  Monday with Dr. [name]. We discussed the risks, benefits, short, and long term effects of radiotherapy, and the patient is interested in proceeding. Dr. Lisbeth Renshaw discusses the delivery and logistics of radiotherapy and anticipates a course of 6 1/2 weeks. We will simulate today at 3pm. He is in agreement. Written consent is obtained and placed in the chart, a copy was provided to the patient.   The above documentation reflects my direct findings during this shared patient visit. Please see the separate note by Dr. Lisbeth Renshaw on this date for the remainder of the patient's plan of care.    Carola Rhine, PAC  ------------------------------------------------  Jodelle Gross, MD, PhD  This document serves as a record of services personally performed by Kyung Rudd MD and Shona Simpson, PA-C. It was created on their behalf by Delton Coombes, a trained medical scribe. The creation of this record is based on the scribe's personal observations and the provider's statements to them.

## 2017-05-01 NOTE — Progress Notes (Signed)
Please see the Nurse Progress Note in the MD Initial Consult Encounter for this patient. 

## 2017-05-01 NOTE — Progress Notes (Signed)
[facility] Psychosocial Distress Screening Clinical Social Work  Clinical Social Work was referred by distress screening protocol.  The patient scored a 7 on the Psychosocial Distress Thermometer which indicates moderate distress. Clinical Social Worker Edwyna Shell  to assess for distress and other psychosocial needs.   ONCBCN DISTRESS SCREENING 05/01/2017  Screening Type Initial Screening  Distress experienced in past week (1-10) 7  Emotional problem type Adjusting to illness  Information Concerns Type Lack of info about treatment  Physical Problem type Sleep/insomnia;Skin dry/itchy  Physician notified of physical symptoms Yes  Referral to clinical psychology No  Referral to clinical social work Yes    Clinical Social Worker follow up needed: Yes.    If yes, follow up plan: attempt recontact  12/4:  Referral received, VM left requesting call back.    Edwyna Shell, LCSW Clinical Social Worker Phone:  [phone]

## 2017-05-02 ENCOUNTER — Telehealth: Payer: Self-pay | Admitting: *Deleted

## 2017-05-02 NOTE — Telephone Encounter (Signed)
patient  Stated "Bryson Ha our PA wanted him to be treated for radiation before his chemo infusion on 05/07/17," that appt is for 1:30, his rad tx after 4pm, transferred call to [facility] RT therapist to see if she can change his tima 11:13 AM

## 2017-05-02 NOTE — Telephone Encounter (Signed)
OK to refill tussionex 

## 2017-05-02 NOTE — Telephone Encounter (Signed)
Called patient home number no answer, and left vm for patient to call back  11:06 AM

## 2017-05-03 ENCOUNTER — Encounter: Payer: Self-pay | Admitting: Internal Medicine

## 2017-05-03 NOTE — Progress Notes (Signed)
Pt has 2 insurances so copay assistance shouldn't be needed.  I reached out to [facility] in the radiation department requesting she inform pt of the [place] to assist with gasoline.

## 2017-05-04 DIAGNOSIS — I252 Old myocardial infarction: Secondary | ICD-10-CM | POA: Diagnosis not present

## 2017-05-04 DIAGNOSIS — C3411 Malignant neoplasm of upper lobe, right bronchus or lung: Secondary | ICD-10-CM | POA: Diagnosis not present

## 2017-05-04 DIAGNOSIS — I77811 Abdominal aortic ectasia: Secondary | ICD-10-CM | POA: Diagnosis not present

## 2017-05-04 DIAGNOSIS — I2510 Atherosclerotic heart disease of native coronary artery without angina pectoris: Secondary | ICD-10-CM | POA: Diagnosis not present

## 2017-05-04 DIAGNOSIS — E119 Type 2 diabetes mellitus without complications: Secondary | ICD-10-CM | POA: Diagnosis not present

## 2017-05-04 DIAGNOSIS — Z510 Encounter for antineoplastic radiation therapy: Secondary | ICD-10-CM | POA: Diagnosis not present

## 2017-05-07 ENCOUNTER — Ambulatory Visit: Payer: Medicare Other

## 2017-05-07 ENCOUNTER — Ambulatory Visit: Payer: Medicare Other | Admitting: Radiation Oncology

## 2017-05-07 ENCOUNTER — Other Ambulatory Visit: Payer: Medicare Other

## 2017-05-08 ENCOUNTER — Telehealth: Payer: Self-pay | Admitting: Internal Medicine

## 2017-05-08 ENCOUNTER — Encounter: Payer: Self-pay | Admitting: Internal Medicine

## 2017-05-08 ENCOUNTER — Telehealth: Payer: Self-pay | Admitting: *Deleted

## 2017-05-08 ENCOUNTER — Other Ambulatory Visit: Payer: Self-pay | Admitting: Internal Medicine

## 2017-05-08 ENCOUNTER — Ambulatory Visit
Admission: RE | Admit: 2017-05-08 | Discharge: 2017-05-08 | Disposition: A | Discharge status: Home / Self Care | Payer: Medicare Other | Source: Ambulatory Visit | Attending: Radiation Oncology | Admitting: Radiation Oncology

## 2017-05-08 DIAGNOSIS — Z510 Encounter for antineoplastic radiation therapy: Secondary | ICD-10-CM | POA: Diagnosis not present

## 2017-05-08 DIAGNOSIS — I2510 Atherosclerotic heart disease of native coronary artery without angina pectoris: Secondary | ICD-10-CM | POA: Diagnosis not present

## 2017-05-08 DIAGNOSIS — I252 Old myocardial infarction: Secondary | ICD-10-CM | POA: Diagnosis not present

## 2017-05-08 DIAGNOSIS — E119 Type 2 diabetes mellitus without complications: Secondary | ICD-10-CM | POA: Diagnosis not present

## 2017-05-08 DIAGNOSIS — C3411 Malignant neoplasm of upper lobe, right bronchus or lung: Secondary | ICD-10-CM | POA: Diagnosis not present

## 2017-05-08 DIAGNOSIS — I77811 Abdominal aortic ectasia: Secondary | ICD-10-CM | POA: Diagnosis not present

## 2017-05-08 NOTE — Telephone Encounter (Signed)
Patient called in wanting to know should he have radiation when he haven't had first treatment yet.

## 2017-05-08 NOTE — Telephone Encounter (Signed)
Returned call to pt, pt missed Taxol/Carbo infusion 12/10 " should I still continue radiation this week?"Informed pt per MD to continue with Rad Treatment and begin Chemotherapy next Monday. Pt verbalized understanding.

## 2017-05-09 ENCOUNTER — Other Ambulatory Visit: Payer: Self-pay | Admitting: Internal Medicine

## 2017-05-09 ENCOUNTER — Ambulatory Visit
Admission: RE | Admit: 2017-05-09 | Discharge: 2017-05-09 | Disposition: A | Discharge status: Home / Self Care | Payer: Medicare Other | Source: Ambulatory Visit | Attending: Radiation Oncology | Admitting: Radiation Oncology

## 2017-05-09 DIAGNOSIS — C3411 Malignant neoplasm of upper lobe, right bronchus or lung: Secondary | ICD-10-CM | POA: Diagnosis not present

## 2017-05-09 DIAGNOSIS — Z510 Encounter for antineoplastic radiation therapy: Secondary | ICD-10-CM | POA: Diagnosis not present

## 2017-05-09 DIAGNOSIS — I2510 Atherosclerotic heart disease of native coronary artery without angina pectoris: Secondary | ICD-10-CM | POA: Diagnosis not present

## 2017-05-09 DIAGNOSIS — E119 Type 2 diabetes mellitus without complications: Secondary | ICD-10-CM | POA: Diagnosis not present

## 2017-05-09 DIAGNOSIS — I77811 Abdominal aortic ectasia: Secondary | ICD-10-CM | POA: Diagnosis not present

## 2017-05-09 DIAGNOSIS — I252 Old myocardial infarction: Secondary | ICD-10-CM | POA: Diagnosis not present

## 2017-05-10 ENCOUNTER — Ambulatory Visit
Admission: RE | Admit: 2017-05-10 | Discharge: 2017-05-10 | Disposition: A | Discharge status: Home / Self Care | Payer: Medicare Other | Source: Ambulatory Visit | Attending: Radiation Oncology | Admitting: Radiation Oncology

## 2017-05-10 DIAGNOSIS — Z510 Encounter for antineoplastic radiation therapy: Secondary | ICD-10-CM | POA: Diagnosis not present

## 2017-05-10 DIAGNOSIS — C3411 Malignant neoplasm of upper lobe, right bronchus or lung: Secondary | ICD-10-CM | POA: Diagnosis not present

## 2017-05-10 DIAGNOSIS — I252 Old myocardial infarction: Secondary | ICD-10-CM | POA: Diagnosis not present

## 2017-05-10 DIAGNOSIS — E119 Type 2 diabetes mellitus without complications: Secondary | ICD-10-CM | POA: Diagnosis not present

## 2017-05-10 DIAGNOSIS — I77811 Abdominal aortic ectasia: Secondary | ICD-10-CM | POA: Diagnosis not present

## 2017-05-10 DIAGNOSIS — I2510 Atherosclerotic heart disease of native coronary artery without angina pectoris: Secondary | ICD-10-CM | POA: Diagnosis not present

## 2017-05-11 ENCOUNTER — Encounter: Payer: Self-pay | Admitting: Physician Assistant

## 2017-05-11 ENCOUNTER — Ambulatory Visit
Admission: RE | Admit: 2017-05-11 | Discharge: 2017-05-11 | Disposition: A | Discharge status: Home / Self Care | Payer: Medicare Other | Source: Ambulatory Visit | Attending: Radiation Oncology | Admitting: Radiation Oncology

## 2017-05-11 ENCOUNTER — Encounter: Payer: Self-pay | Admitting: Radiation Oncology

## 2017-05-11 DIAGNOSIS — I252 Old myocardial infarction: Secondary | ICD-10-CM | POA: Diagnosis not present

## 2017-05-11 DIAGNOSIS — E119 Type 2 diabetes mellitus without complications: Secondary | ICD-10-CM | POA: Diagnosis not present

## 2017-05-11 DIAGNOSIS — I77811 Abdominal aortic ectasia: Secondary | ICD-10-CM | POA: Diagnosis not present

## 2017-05-11 DIAGNOSIS — Z510 Encounter for antineoplastic radiation therapy: Secondary | ICD-10-CM | POA: Diagnosis not present

## 2017-05-11 DIAGNOSIS — I2510 Atherosclerotic heart disease of native coronary artery without angina pectoris: Secondary | ICD-10-CM | POA: Diagnosis not present

## 2017-05-11 DIAGNOSIS — C3411 Malignant neoplasm of upper lobe, right bronchus or lung: Secondary | ICD-10-CM | POA: Diagnosis not present

## 2017-05-11 NOTE — Progress Notes (Signed)
Financial Counselor--Spoke with patients wife today--she provided income verification--they are over income for [employer]

## 2017-05-11 NOTE — Progress Notes (Signed)
  Radiation Oncology         [phone] ________________________________  Name: Randy [name] MRN: [id number]  Date: 05/01/2017  DOB: [birth year]  SIMULATION AND TREATMENT PLANNING NOTE  DIAGNOSIS:     ICD-10-CM   1. Malignant neoplasm of bronchus of right upper lobe ([place]) C34.11      NARRATIVE:  The patient was brought to the [facility].  Identity was confirmed.  All relevant records and images related to the planned course of therapy were reviewed.   Written consent to proceed with treatment was confirmed which was freely given after reviewing the details related to the planned course of therapy had been reviewed with the patient.  Then, the patient was set-up in a stable reproducible  supine position for radiation therapy.  CT images were obtained.  Surface markings were placed.    Medically necessary complex treatment device(s) for immobilization:  Vac-lock bag.   The CT images were loaded into the planning software.  Then the target and avoidance structures were contoured.  Treatment planning then occurred.  The radiation prescription was entered and confirmed.  A total of 3 complex treatment devices were fabricated which relate to the designed radiation treatment fields. Each of these customized fields/ complex treatment devices will be used on a daily basis during the radiation course. I have requested : 3D Simulation  I have requested a DVH of the following structures: target volume, spinal cord, lungs, heart.   The patient will undergo daily image guidance to ensure accurate localization of the target, and adequate minimize dose to the normal surrounding structures in close proximity to the target.  PLAN:  The patient will receive 60 Gy in 30 fractions initially. The patient will then receive a 6 Gy boost for a final dose of 66 Gy.  Special treatment procedure The patient will also receive concurrent chemotherapy during the treatment. The patient may therefore  experience increased toxicity or side effects and the patient will be monitored for such problems. This may require extra lab work as necessary. This therefore constitutes a special treatment procedure.   ________________________________   Jodelle Gross, MD, PhD

## 2017-05-12 ENCOUNTER — Ambulatory Visit
Admission: RE | Admit: 2017-05-12 | Discharge: 2017-05-12 | Disposition: A | Discharge status: Home / Self Care | Payer: Medicare Other | Source: Ambulatory Visit | Attending: Radiation Oncology | Admitting: Radiation Oncology

## 2017-05-12 DIAGNOSIS — I252 Old myocardial infarction: Secondary | ICD-10-CM | POA: Diagnosis not present

## 2017-05-12 DIAGNOSIS — I77811 Abdominal aortic ectasia: Secondary | ICD-10-CM | POA: Diagnosis not present

## 2017-05-12 DIAGNOSIS — E119 Type 2 diabetes mellitus without complications: Secondary | ICD-10-CM | POA: Diagnosis not present

## 2017-05-12 DIAGNOSIS — C3411 Malignant neoplasm of upper lobe, right bronchus or lung: Secondary | ICD-10-CM | POA: Diagnosis not present

## 2017-05-12 DIAGNOSIS — I2510 Atherosclerotic heart disease of native coronary artery without angina pectoris: Secondary | ICD-10-CM | POA: Diagnosis not present

## 2017-05-12 DIAGNOSIS — Z510 Encounter for antineoplastic radiation therapy: Secondary | ICD-10-CM | POA: Diagnosis not present

## 2017-05-14 ENCOUNTER — Encounter: Payer: Self-pay | Admitting: Internal Medicine

## 2017-05-14 ENCOUNTER — Ambulatory Visit (HOSPITAL_BASED_OUTPATIENT_CLINIC_OR_DEPARTMENT_OTHER): Payer: Medicare Other

## 2017-05-14 ENCOUNTER — Ambulatory Visit
Admission: RE | Admit: 2017-05-14 | Discharge: 2017-05-14 | Disposition: A | Discharge status: Home / Self Care | Payer: Medicare Other | Source: Ambulatory Visit | Attending: Radiation Oncology | Admitting: Radiation Oncology

## 2017-05-14 ENCOUNTER — Ambulatory Visit (HOSPITAL_BASED_OUTPATIENT_CLINIC_OR_DEPARTMENT_OTHER): Payer: Medicare Other | Admitting: Internal Medicine

## 2017-05-14 ENCOUNTER — Encounter: Payer: Self-pay | Admitting: *Deleted

## 2017-05-14 ENCOUNTER — Other Ambulatory Visit (HOSPITAL_BASED_OUTPATIENT_CLINIC_OR_DEPARTMENT_OTHER): Payer: Medicare Other

## 2017-05-14 VITALS — BP 131/74 | HR 94 | Temp 98.2°F | Resp 18

## 2017-05-14 VITALS — BP 128/79 | HR 83 | Temp 98.2°F | Resp 16 | Wt 204.2 lb

## 2017-05-14 DIAGNOSIS — I252 Old myocardial infarction: Secondary | ICD-10-CM | POA: Diagnosis not present

## 2017-05-14 DIAGNOSIS — Z5111 Encounter for antineoplastic chemotherapy: Secondary | ICD-10-CM

## 2017-05-14 DIAGNOSIS — I2510 Atherosclerotic heart disease of native coronary artery without angina pectoris: Secondary | ICD-10-CM | POA: Diagnosis not present

## 2017-05-14 DIAGNOSIS — C3411 Malignant neoplasm of upper lobe, right bronchus or lung: Secondary | ICD-10-CM

## 2017-05-14 DIAGNOSIS — I77811 Abdominal aortic ectasia: Secondary | ICD-10-CM | POA: Diagnosis not present

## 2017-05-14 DIAGNOSIS — E119 Type 2 diabetes mellitus without complications: Secondary | ICD-10-CM | POA: Diagnosis not present

## 2017-05-14 DIAGNOSIS — Z510 Encounter for antineoplastic radiation therapy: Secondary | ICD-10-CM | POA: Diagnosis not present

## 2017-05-14 DIAGNOSIS — R05 Cough: Secondary | ICD-10-CM | POA: Diagnosis not present

## 2017-05-14 DIAGNOSIS — C3491 Malignant neoplasm of unspecified part of right bronchus or lung: Secondary | ICD-10-CM

## 2017-05-14 LAB — COMPREHENSIVE METABOLIC PANEL
ALBUMIN: 2.7 g/dL — AB (ref 3.5–5.0)
ALK PHOS: 52 U/L (ref 40–150)
ALT: 13 U/L (ref 0–55)
AST: 13 U/L (ref 5–34)
Anion Gap: 9 mEq/L (ref 3–11)
BILIRUBIN TOTAL: 0.57 mg/dL (ref 0.20–1.20)
BUN: 9.1 mg/dL (ref 7.0–26.0)
CO2: 25 meq/L (ref 22–29)
CREATININE: 0.7 mg/dL (ref 0.7–1.3)
Calcium: 9 mg/dL (ref 8.4–10.4)
Chloride: 95 mEq/L — ABNORMAL LOW (ref 98–109)
GLUCOSE: 173 mg/dL — AB (ref 70–140)
Potassium: 4.3 mEq/L (ref 3.5–5.1)
SODIUM: 129 meq/L — AB (ref 136–145)
TOTAL PROTEIN: 7.1 g/dL (ref 6.4–8.3)

## 2017-05-14 LAB — CBC WITH DIFFERENTIAL/PLATELET
BASO%: 0.3 % (ref 0.0–2.0)
BASOS ABS: 0 10*3/uL (ref 0.0–0.1)
EOS%: 1.8 % (ref 0.0–7.0)
Eosinophils Absolute: 0.2 10*3/uL (ref 0.0–0.5)
HCT: 37.8 % — ABNORMAL LOW (ref 38.4–49.9)
HGB: 12.2 g/dL — ABNORMAL LOW (ref 13.0–17.1)
LYMPH#: 1.4 10*3/uL (ref 0.9–3.3)
LYMPH%: 12 % — AB (ref 14.0–49.0)
MCH: 27.4 pg (ref 27.2–33.4)
MCHC: 32.3 g/dL (ref 32.0–36.0)
MCV: 84.8 fL (ref 79.3–98.0)
MONO#: 1.1 10*3/uL — ABNORMAL HIGH (ref 0.1–0.9)
MONO%: 9 % (ref 0.0–14.0)
NEUT#: 9.2 10*3/uL — ABNORMAL HIGH (ref 1.5–6.5)
NEUT%: 76.9 % — AB (ref 39.0–75.0)
Platelets: 328 10*3/uL (ref 140–400)
RBC: 4.46 10*6/uL (ref 4.20–5.82)
RDW: 14.1 % (ref 11.0–14.6)
WBC: 12 10*3/uL — ABNORMAL HIGH (ref 4.0–10.3)

## 2017-05-14 MED ORDER — SODIUM CHLORIDE 0.9 % IV SOLN
20.0000 mg | Freq: Once | INTRAVENOUS | Status: AC
  Administered 2017-05-14: 20 mg via INTRAVENOUS
  Filled 2017-05-14: qty 2

## 2017-05-14 MED ORDER — ACETAMINOPHEN 325 MG PO TABS
ORAL_TABLET | ORAL | Status: AC
  Filled 2017-05-14: qty 2

## 2017-05-14 MED ORDER — SODIUM CHLORIDE 0.9 % IV SOLN
Freq: Once | INTRAVENOUS | Status: AC
  Administered 2017-05-14: 14:00:00 via INTRAVENOUS

## 2017-05-14 MED ORDER — SODIUM CHLORIDE 0.9 % IV SOLN
45.0000 mg/m2 | Freq: Once | INTRAVENOUS | Status: AC
  Administered 2017-05-14: 96 mg via INTRAVENOUS
  Filled 2017-05-14: qty 16

## 2017-05-14 MED ORDER — FAMOTIDINE IN NACL 20-0.9 MG/50ML-% IV SOLN
20.0000 mg | Freq: Once | INTRAVENOUS | Status: AC
  Administered 2017-05-14: 20 mg via INTRAVENOUS

## 2017-05-14 MED ORDER — FAMOTIDINE IN NACL 20-0.9 MG/50ML-% IV SOLN
INTRAVENOUS | Status: AC
  Filled 2017-05-14: qty 50

## 2017-05-14 MED ORDER — ACETAMINOPHEN 325 MG PO TABS
650.0000 mg | ORAL_TABLET | Freq: Once | ORAL | Status: AC
  Administered 2017-05-14: 650 mg via ORAL

## 2017-05-14 MED ORDER — PALONOSETRON HCL INJECTION 0.25 MG/5ML
INTRAVENOUS | Status: AC
  Filled 2017-05-14: qty 5

## 2017-05-14 MED ORDER — SODIUM CHLORIDE 0.9 % IV SOLN
230.4000 mg | Freq: Once | INTRAVENOUS | Status: AC
  Administered 2017-05-14: 230 mg via INTRAVENOUS
  Filled 2017-05-14: qty 23

## 2017-05-14 MED ORDER — DIPHENHYDRAMINE HCL 50 MG/ML IJ SOLN
INTRAMUSCULAR | Status: AC
  Filled 2017-05-14: qty 1

## 2017-05-14 MED ORDER — PALONOSETRON HCL INJECTION 0.25 MG/5ML
0.2500 mg | Freq: Once | INTRAVENOUS | Status: AC
  Administered 2017-05-14: 0.25 mg via INTRAVENOUS

## 2017-05-14 MED ORDER — DIPHENHYDRAMINE HCL 50 MG/ML IJ SOLN
50.0000 mg | Freq: Once | INTRAMUSCULAR | Status: AC
  Administered 2017-05-14: 50 mg via INTRAVENOUS

## 2017-05-14 NOTE — Progress Notes (Signed)
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  PRIOR THERAPY: None.  CURRENT THERAPY: A course of concurrent chemoradiation with weekly carboplatin for AC of 2 and paclitaxel 45 mg/M2  INTERVAL HISTORY: Randy [name] 71 y.o. male returns to the clinic today for follow-up visit accompanied by his wife.  The patient started radiotherapy last week.  His first dose of chemotherapy was delayed of the snowstorm.  He tolerated the first week of his treatment fairly well.  He denied having any chest pain, shortness breath, but continues to have cough with no hemoptysis.  He denied having any fever or chills.  He has no nausea, vomiting, diarrhea or constipation.  He is here today for evaluation before starting the first cycle of his chemotherapy.  MEDICAL HISTORY: Past Medical History:  Diagnosis Date  . 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 Repeat US due 2023  . Carotid arterial disease ([place])    a. S/p R CEA;  b. 05/2015 Carotid U/S: RICA 1-02%, LICA 72-53%.  . Coronary artery disease    a. 2012 MI in [place] w/ PCI  //  b. 04/2013 MV: inflat scar, no ischemia.  //  c.   NSTEMI 5/17 >> Promus DES to LCx and POBA to OM1 (ISR).   . Diabetes mellitus without complication ([facility])   . Grade II diastolic dysfunction 66/44/0347  . History of cardiac catheterization 10/21/15   a. NSTEMI 5/17: oLAD 40, pLCx 90 (Promus DES), OM2 90 ISR (POBA), oRCA 25   . History of echocardiogram    a. Mild LVH, EF 50-55%, possible inferolateral HK, grade 2 diastolic dysfunction,  trivial AI, MAC  . History of nuclear stress test 04/2013   a. Myoview 12/14: Intermediate risk, prior inferolateral MI, no ischemia, EF 45%  . Hx of leukocytosis   . Hyperlipidemia   . Hypertension   . Hypertensive heart disease   . Morbid obesity ([place])   . Tobacco abuse     ALLERGIES:  is allergic to brilinta [ticagrelor].  MEDICATIONS:  Current Outpatient Medications  Medication Sig Dispense Refill  . aspirin EC 81 MG tablet Take 81 mg by mouth daily.    [name] atorvastatin (LIPITOR) 40 MG tablet Take 1 tablet (40 mg total) by mouth daily. 90 tablet 3  . chlorpheniramine-HYDROcodone (TUSSIONEX PENNKINETIC ER) 10-8 MG/5ML SUER Take 5 mLs by mouth every 12 (twelve) hours as needed for cough. 140 mL 0  . clopidogrel (PLAVIX) 75 MG tablet Take 1 tablet (75 mg total) by mouth daily. 90 tablet 3  . irbesartan (AVAPRO) 75 MG tablet Take 75 mg by mouth daily as needed.     . metFORMIN (GLUCOPHAGE) 500 MG tablet Take 0.5 tablets (250 mg total) by mouth 2 (two) times daily with a meal. 90 tablet 0  . metoprolol succinate (TOPROL-XL) 25 MG 24 hr tablet TAKE 1/2 TABLETS BY MOUTH DAILY 45 tablet 1  . VENTOLIN HFA 108 (90 Base) MCG/ACT inhaler Inhale 1 puff every 4 (four) hours as needed into the lungs for wheezing or shortness of breath. 1 Inhaler 5  . benzonatate (TESSALON) 200 MG  capsule     . irbesartan (AVAPRO) 75 MG tablet TAKE 1 TABLET BY MOUTH EVERY DAY 90 tablet 0  . metoprolol succinate (TOPROL-XL) 25 MG 24 hr tablet Take 12.5 mg by mouth daily.    . predniSONE (DELTASONE) 50 MG tablet     . prochlorperazine (COMPAZINE) 10 MG tablet Take 1 tablet (10 mg total) by mouth every 6 (six) hours as needed for nausea or vomiting. (Patient not taking: Reported on 05/01/2017) 30 tablet 2   No current facility-administered medications for this visit.     SURGICAL HISTORY:  Past Surgical History:  Procedure Laterality Date  . CARDIAC CATHETERIZATION    . CARDIAC CATHETERIZATION N/A 10/21/2015    Procedure: Left Heart Cath and Coronary Angiography;  Surgeon: [name], MD;  Location: [facility];  Service: Cardiovascular;  Laterality: N/A;  . CARDIAC CATHETERIZATION N/A 10/21/2015   Procedure: Coronary Stent Intervention;  Surgeon: [name], MD;  Location: [facility];  Service: Cardiovascular;  Laterality: N/A;  . CARDIAC CATHETERIZATION N/A 10/21/2015   Procedure: Coronary Balloon Angioplasty;  Surgeon: [name], MD;  Location: [facility];  Service: Cardiovascular;  Laterality: N/A;  . CAROTID ENDARTERECTOMY    . CORONARY ANGIOPLASTY    . CORONARY STENT PLACEMENT    . VIDEO BRONCHOSCOPY Bilateral 04/03/2017   Procedure: VIDEO BRONCHOSCOPY WITH FLUORO;  Surgeon: [name], MD;  Location: [facility];  Service: Cardiopulmonary;  Laterality: Bilateral;    REVIEW OF SYSTEMS:  A comprehensive review of systems was negative except for: Constitutional: positive for fatigue Respiratory: positive for cough   PHYSICAL EXAMINATION: General appearance: alert, cooperative, fatigued and no distress Head: Normocephalic, without obvious abnormality, atraumatic Neck: no adenopathy, no JVD, supple, symmetrical, trachea midline and thyroid not enlarged, symmetric, no tenderness/mass/nodules Lymph nodes: Cervical, supraclavicular, and axillary nodes normal. Resp: clear to auscultation bilaterally Back: symmetric, no curvature. ROM normal. No CVA tenderness. Cardio: regular rate and rhythm, S1, S2 normal, no murmur, click, rub or gallop GI: soft, non-tender; bowel sounds normal; no masses,  no organomegaly Extremities: extremities normal, atraumatic, no cyanosis or edema  ECOG PERFORMANCE STATUS: 1 - Symptomatic but completely ambulatory  Blood pressure 128/79, pulse 83, temperature 98.2 F (36.8 C), temperature source Oral, resp. rate 16, weight 204 lb 3.2 oz (92.6 kg), SpO2 98 %.  LABORATORY DATA: Lab Results  Component Value Date    WBC 12.0 (H) 05/14/2017   HGB 12.2 (L) 05/14/2017   HCT 37.8 (L) 05/14/2017   MCV 84.8 05/14/2017   PLT 328 05/14/2017      Chemistry      Component Value Date/Time   NA 129 (L) 05/14/2017 0935   K 4.3 05/14/2017 0935   CL 99 10/20/2016 1508   CO2 25 05/14/2017 0935   BUN 9.1 05/14/2017 0935   CREATININE 0.7 05/14/2017 0935      Component Value Date/Time   CALCIUM 9.0 05/14/2017 0935   ALKPHOS 52 05/14/2017 0935   AST 13 05/14/2017 0935   ALT 13 05/14/2017 0935   BILITOT 0.57 05/14/2017 0935       RADIOGRAPHIC STUDIES: Mr Jeri Cos MP Contrast  Result Date: 04/16/2017 CLINICAL DATA:  71 year old male with new diagnosis of non-small cell lung cancer. Staging. EXAM: MRI HEAD WITHOUT AND WITH CONTRAST TECHNIQUE: Multiplanar, multiecho pulse sequences of the brain and surrounding structures were obtained without and with intravenous contrast. CONTRAST:  62m MULTIHANCE GADOBENATE DIMEGLUMINE 529 MG/ML IV SOLN COMPARISON:  PET-CT  today reported separately. FINDINGS: Brain: Multifocal chronic cortical encephalomalacia in the right parietal lobe including the sensory strip (series 6, images 19 and 16). Some associated subcortical white matter gliosis. No restricted diffusion or evidence of acute infarction. No midline shift, mass effect, or evidence of intracranial mass lesion. No abnormal enhancement identified. No dural thickening. No other cortical encephalomalacia identified. Scattered mild to moderate nonspecific mostly small foci of cerebral white matter T2 and FLAIR hyperintensity elsewhere. Probable tiny chronic lacunes in the cerebellum. No chronic cerebral blood products. No ventriculomegaly, extra-axial collection or acute intracranial hemorrhage. Cervicomedullary junction and pituitary are within normal limits. Vascular: Major intracranial vascular flow voids are preserved, the distal left vertebral artery is dominant. Skull and upper cervical spine: Susceptibility artifact along  the left frontal bone and scalp on series 3, image 14. Visualized bone marrow signal is within normal limits. Negative visible cervical spine and spinal cord. Sinuses/Orbits: Normal orbits soft tissues. Paranasal sinuses are clear. Other: Visible internal auditory structures appear normal. Mastoid air cells are clear. Asymmetric atrophy of the right parotid gland. Normal stylomastoid foramina. Other scalp and visible face soft tissues appear negative. IMPRESSION: 1.  No metastatic disease or acute intracranial abnormality. 2. Chronic right parietal lobe encephalomalacia suggesting prior posterior right MCA infarct. Tiny chronic cerebellar lacunes suspected. Otherwise mild for age nonspecific white matter signal changes Electronically Signed   By: Genevie Ann M.D.   On: 04/16/2017 10:53   Nm Pet Image Initial (pi) Skull Base To Thigh  Result Date: 04/16/2017 CLINICAL DATA:  Initial treatment strategy for staging of lung cancer. EXAM: NUCLEAR MEDICINE PET SKULL BASE TO THIGH TECHNIQUE: 3.2 mCi F-18 FDG was injected intravenously. Full-ring PET imaging was performed from the skull base to thigh after the radiotracer. CT data was obtained and used for attenuation correction and anatomic localization. FASTING BLOOD GLUCOSE:  Value: 126 mg/dl COMPARISON:  CT 03/13/2017 FINDINGS: NECK: No areas of abnormal hypermetabolism. Bilateral carotid atherosclerosis. No cervical adenopathy. CHEST: Hypermetabolic central right upper lobe lung mass which measures 5.0 cm and a S.U.V. max of 22.2, including on image 30/series a 8. This is similar in size to on the prior diagnostic CT. Surrounding presumed postobstructive pneumonitis. A more nodular hypermetabolic component laterally including at a S.U.V. max of 3.6 on image 27/ series 8 could represent tumor extension or postobstructive pneumonitis. A focus of right hilar hypermetabolism measures a S.U.V. max of 4.5, including on image 76/series 4. This is favored to correspond to a  node on image 162/series 3 of the prior diagnostic CT Right mediastinal node measures 7 mm and a S.U.V. max of 3.0 on image 74/series 4. Multivessel coronary artery atherosclerosis. Tiny hiatal hernia. Centrilobular and paraseptal emphysema. Scattered pulmonary nodules, below PET resolution. These are grossly similar and nonspecific. Example in the right middle lobe at 6 mm on image 45/series 8. ABDOMEN/PELVIS: No abdominopelvic parenchymal or nodal hypermetabolism. Punctate right renal collecting system calculi. Normal adrenal glands. Abdominal aortic and branch vessel atherosclerosis is advanced. Mild prostatomegaly. SKELETON: No abnormal marrow activity. IMPRESSION: 1. Hypermetabolic right upper lobe lung mass, consistent with primary bronchogenic carcinoma. Surrounding postobstructive pneumonitis. 2. Probable right hilar/infrahilar nodal metastasis. Equivocal activity within a non pathologically enlarged right mediastinum. 3. No evidence of extrathoracic metastatic disease. 4. Nonspecific pulmonary nodules. 5. Coronary artery atherosclerosis. Aortic Atherosclerosis (ICD10-I70.0). 6. Right nephrolithiasis. Electronically Signed   By: Abigail Miyamoto M.D.   On: 04/16/2017 10:04    ASSESSMENT AND PLAN: This is a very pleasant 71 years old  white male with a stage IIIb non-small cell lung cancer, squamous cell carcinoma. The patient is currently undergoing a course of concurrent chemoradiation with weekly carboplatin and paclitaxel.  He starting today the first cycle of the chemotherapy. I recommended for the patient to proceed with the treatment as planned. He will come back for follow-up visit in 2 weeks for reevaluation and management of any adverse effect of his treatment. For the cough, he will continue on Tussionex. The patient was advised to call immediately if he has any concerning symptoms in the interval. The patient voices understanding of current disease status and treatment options and is in  agreement with the current care plan.  All questions were answered. The patient knows to call the clinic with any problems, questions or concerns. We can certainly see the patient much sooner if necessary.  I spent 10 minutes counseling the patient face to face. The total time spent in the appointment was 15 minutes.  Disclaimer: This note was dictated with voice recognition software. Similar sounding words can inadvertently be transcribed and may not be corrected upon review.

## 2017-05-14 NOTE — Patient Instructions (Signed)
[facility] Discharge Instructions for Patients Receiving Chemotherapy  Today you received the following chemotherapy agents: Taxol and Carboplatin   To help prevent nausea and vomiting after your treatment, we encourage you to take your nausea medication as directed.    If you develop nausea and vomiting that is not controlled by your nausea medication, call the clinic.   BELOW ARE SYMPTOMS THAT SHOULD BE REPORTED IMMEDIATELY:  *FEVER GREATER THAN 100.5 F  *CHILLS WITH OR WITHOUT FEVER  NAUSEA AND VOMITING THAT IS NOT CONTROLLED WITH YOUR NAUSEA MEDICATION  *UNUSUAL SHORTNESS OF BREATH  *UNUSUAL BRUISING OR BLEEDING  TENDERNESS IN MOUTH AND THROAT WITH OR WITHOUT PRESENCE OF ULCERS  *URINARY PROBLEMS  *BOWEL PROBLEMS  UNUSUAL RASH Items with * indicate a potential emergency and should be followed up as soon as possible.  Feel free to call the clinic should you have any questions or concerns. The clinic phone number is [phone].  Please show the [place] at check-in to the Emergency Department and triage nurse.   Paclitaxel injection What is this medicine? PACLITAXEL (PAK li TAX el) is a chemotherapy drug. It targets fast dividing cells, like cancer cells, and causes these cells to die. This medicine is used to treat ovarian cancer, breast cancer, and other cancers. This medicine may be used for other purposes; ask your health care provider or pharmacist if you have questions. COMMON BRAND NAME(S): Onxol, Taxol What should I tell my health care provider before I take this medicine? They need to know if you have any of these conditions: -blood disorders -irregular heartbeat -infection (especially a virus infection such as chickenpox, cold sores, or herpes) -liver disease -previous or ongoing radiation therapy -an unusual or allergic reaction to paclitaxel, alcohol, polyoxyethylated castor oil, other chemotherapy agents, other medicines, foods,  dyes, or preservatives -pregnant or trying to get pregnant -breast-feeding How should I use this medicine? This drug is given as an infusion into a vein. It is administered in a hospital or clinic by a specially trained health care professional. Talk to your pediatrician regarding the use of this medicine in children. Special care may be needed. Overdosage: If you think you have taken too much of this medicine contact a poison control center or emergency room at once. NOTE: This medicine is only for you. Do not share this medicine with others. What if I miss a dose? It is important not to miss your dose. Call your doctor or health care professional if you are unable to keep an appointment. What may interact with this medicine? Do not take this medicine with any of the following medications: -disulfiram -metronidazole This medicine may also interact with the following medications: -cyclosporine -diazepam -ketoconazole -medicines to increase blood counts like filgrastim, pegfilgrastim, sargramostim -other chemotherapy drugs like cisplatin, doxorubicin, epirubicin, etoposide, teniposide, vincristine -quinidine -testosterone -vaccines -verapamil Talk to your doctor or health care professional before taking any of these medicines: -acetaminophen -aspirin -ibuprofen -ketoprofen -naproxen This list may not describe all possible interactions. Give your health care provider a list of all the medicines, herbs, non-prescription drugs, or dietary supplements you use. Also tell them if you smoke, drink alcohol, or use illegal drugs. Some items may interact with your medicine. What should I watch for while using this medicine? Your condition will be monitored carefully while you are receiving this medicine. You will need important blood work done while you are taking this medicine. This medicine can cause serious allergic reactions. To reduce your risk  you will need to take other medicine(s)  before treatment with this medicine. If you experience allergic reactions like skin rash, itching or hives, swelling of the face, lips, or tongue, tell your doctor or health care professional right away. In some cases, you may be given additional medicines to help with side effects. Follow all directions for their use. This drug may make you feel generally unwell. This is not uncommon, as chemotherapy can affect healthy cells as well as cancer cells. Report any side effects. Continue your course of treatment even though you feel ill unless your doctor tells you to stop. Call your doctor or health care professional for advice if you get a fever, chills or sore throat, or other symptoms of a cold or flu. Do not treat yourself. This drug decreases your body's ability to fight infections. Try to avoid being around people who are sick. This medicine may increase your risk to bruise or bleed. Call your doctor or health care professional if you notice any unusual bleeding. Be careful brushing and flossing your teeth or using a toothpick because you may get an infection or bleed more easily. If you have any dental work done, tell your dentist you are receiving this medicine. Avoid taking products that contain aspirin, acetaminophen, ibuprofen, naproxen, or ketoprofen unless instructed by your doctor. These medicines may hide a fever. Do not become pregnant while taking this medicine. Women should inform their doctor if they wish to become pregnant or think they might be pregnant. There is a potential for serious side effects to an unborn child. Talk to your health care professional or pharmacist for more information. Do not breast-feed an infant while taking this medicine. Men are advised not to father a child while receiving this medicine. This product may contain alcohol. Ask your pharmacist or healthcare provider if this medicine contains alcohol. Be sure to tell all healthcare providers you are taking this  medicine. Certain medicines, like metronidazole and disulfiram, can cause an unpleasant reaction when taken with alcohol. The reaction includes flushing, headache, nausea, vomiting, sweating, and increased thirst. The reaction can last from 30 minutes to several hours. What side effects may I notice from receiving this medicine? Side effects that you should report to your doctor or health care professional as soon as possible: -allergic reactions like skin rash, itching or hives, swelling of the face, lips, or tongue -low blood counts - This drug may decrease the number of white blood cells, red blood cells and platelets. You may be at increased risk for infections and bleeding. -signs of infection - fever or chills, cough, sore throat, pain or difficulty passing urine -signs of decreased platelets or bleeding - bruising, pinpoint red spots on the skin, black, tarry stools, nosebleeds -signs of decreased red blood cells - unusually weak or tired, fainting spells, lightheadedness -breathing problems -chest pain -high or low blood pressure -mouth sores -nausea and vomiting -pain, swelling, redness or irritation at the injection site -pain, tingling, numbness in the hands or feet -slow or irregular heartbeat -swelling of the ankle, feet, hands Side effects that usually do not require medical attention (report to your doctor or health care professional if they continue or are bothersome): -bone pain -complete hair loss including hair on your head, underarms, pubic hair, eyebrows, and eyelashes -changes in the color of fingernails -diarrhea -loosening of the fingernails -loss of appetite -muscle or joint pain -red flush to skin -sweating This list may not describe all possible side effects. Call your doctor  for medical advice about side effects. You may report side effects to FDA at 1-800-FDA-1088. Where should I keep my medicine? This drug is given in a hospital or clinic and will not be  stored at home. NOTE: This sheet is a summary. It may not cover all possible information. If you have questions about this medicine, talk to your doctor, pharmacist, or health care provider.  2018 Elsevier/Gold Standard (2015-03-16 19:58:00)    Carboplatin injection What is this medicine? CARBOPLATIN (KAR boe pla tin) is a chemotherapy drug. It targets fast dividing cells, like cancer cells, and causes these cells to die. This medicine is used to treat ovarian cancer and many other cancers. This medicine may be used for other purposes; ask your health care provider or pharmacist if you have questions. COMMON BRAND NAME(S): Paraplatin What should I tell my health care provider before I take this medicine? They need to know if you have any of these conditions: -blood disorders -hearing problems -kidney disease -recent or ongoing radiation therapy -an unusual or allergic reaction to carboplatin, cisplatin, other chemotherapy, other medicines, foods, dyes, or preservatives -pregnant or trying to get pregnant -breast-feeding How should I use this medicine? This drug is usually given as an infusion into a vein. It is administered in a hospital or clinic by a specially trained health care professional. Talk to your pediatrician regarding the use of this medicine in children. Special care may be needed. Overdosage: If you think you have taken too much of this medicine contact a poison control center or emergency room at once. NOTE: This medicine is only for you. Do not share this medicine with others. What if I miss a dose? It is important not to miss a dose. Call your doctor or health care professional if you are unable to keep an appointment. What may interact with this medicine? -medicines for seizures -medicines to increase blood counts like filgrastim, pegfilgrastim, sargramostim -some antibiotics like amikacin, gentamicin, neomycin, streptomycin, tobramycin -vaccines Talk to your  doctor or health care professional before taking any of these medicines: -acetaminophen -aspirin -ibuprofen -ketoprofen -naproxen This list may not describe all possible interactions. Give your health care provider a list of all the medicines, herbs, non-prescription drugs, or dietary supplements you use. Also tell them if you smoke, drink alcohol, or use illegal drugs. Some items may interact with your medicine. What should I watch for while using this medicine? Your condition will be monitored carefully while you are receiving this medicine. You will need important blood work done while you are taking this medicine. This drug may make you feel generally unwell. This is not uncommon, as chemotherapy can affect healthy cells as well as cancer cells. Report any side effects. Continue your course of treatment even though you feel ill unless your doctor tells you to stop. In some cases, you may be given additional medicines to help with side effects. Follow all directions for their use. Call your doctor or health care professional for advice if you get a fever, chills or sore throat, or other symptoms of a cold or flu. Do not treat yourself. This drug decreases your body's ability to fight infections. Try to avoid being around people who are sick. This medicine may increase your risk to bruise or bleed. Call your doctor or health care professional if you notice any unusual bleeding. Be careful brushing and flossing your teeth or using a toothpick because you may get an infection or bleed more easily. If you have any dental  work done, tell your dentist you are receiving this medicine. Avoid taking products that contain aspirin, acetaminophen, ibuprofen, naproxen, or ketoprofen unless instructed by your doctor. These medicines may hide a fever. Do not become pregnant while taking this medicine. Women should inform their doctor if they wish to become pregnant or think they might be pregnant. There is a  potential for serious side effects to an unborn child. Talk to your health care professional or pharmacist for more information. Do not breast-feed an infant while taking this medicine. What side effects may I notice from receiving this medicine? Side effects that you should report to your doctor or health care professional as soon as possible: -allergic reactions like skin rash, itching or hives, swelling of the face, lips, or tongue -signs of infection - fever or chills, cough, sore throat, pain or difficulty passing urine -signs of decreased platelets or bleeding - bruising, pinpoint red spots on the skin, black, tarry stools, nosebleeds -signs of decreased red blood cells - unusually weak or tired, fainting spells, lightheadedness -breathing problems -changes in hearing -changes in vision -chest pain -high blood pressure -low blood counts - This drug may decrease the number of white blood cells, red blood cells and platelets. You may be at increased risk for infections and bleeding. -nausea and vomiting -pain, swelling, redness or irritation at the injection site -pain, tingling, numbness in the hands or feet -problems with balance, talking, walking -trouble passing urine or change in the amount of urine Side effects that usually do not require medical attention (report to your doctor or health care professional if they continue or are bothersome): -hair loss -loss of appetite -metallic taste in the mouth or changes in taste This list may not describe all possible side effects. Call your doctor for medical advice about side effects. You may report side effects to FDA at 1-800-FDA-1088. Where should I keep my medicine? This drug is given in a hospital or clinic and will not be stored at home. NOTE: This sheet is a summary. It may not cover all possible information. If you have questions about this medicine, talk to your doctor, pharmacist, or health care provider.  2018 Elsevier/Gold  Standard (2007-08-20 14:38:05)

## 2017-05-14 NOTE — Progress Notes (Signed)
Sodium 129, Dr. [name] aware, no new orders.  Printed discharge given and reviewed. Pt verbalizes understanding.

## 2017-05-15 ENCOUNTER — Ambulatory Visit
Admission: RE | Admit: 2017-05-15 | Discharge: 2017-05-15 | Disposition: A | Discharge status: Home / Self Care | Payer: Medicare Other | Source: Ambulatory Visit | Attending: Radiation Oncology | Admitting: Radiation Oncology

## 2017-05-15 ENCOUNTER — Other Ambulatory Visit: Payer: Self-pay | Admitting: *Deleted

## 2017-05-15 DIAGNOSIS — I2510 Atherosclerotic heart disease of native coronary artery without angina pectoris: Secondary | ICD-10-CM | POA: Diagnosis not present

## 2017-05-15 DIAGNOSIS — Z510 Encounter for antineoplastic radiation therapy: Secondary | ICD-10-CM | POA: Diagnosis not present

## 2017-05-15 DIAGNOSIS — E119 Type 2 diabetes mellitus without complications: Secondary | ICD-10-CM | POA: Diagnosis not present

## 2017-05-15 DIAGNOSIS — C3411 Malignant neoplasm of upper lobe, right bronchus or lung: Secondary | ICD-10-CM | POA: Diagnosis not present

## 2017-05-15 DIAGNOSIS — I77811 Abdominal aortic ectasia: Secondary | ICD-10-CM | POA: Diagnosis not present

## 2017-05-15 DIAGNOSIS — Z006 Encounter for examination for normal comparison and control in clinical research program: Secondary | ICD-10-CM

## 2017-05-15 DIAGNOSIS — I252 Old myocardial infarction: Secondary | ICD-10-CM | POA: Diagnosis not present

## 2017-05-16 ENCOUNTER — Ambulatory Visit
Admission: RE | Admit: 2017-05-16 | Discharge: 2017-05-16 | Disposition: A | Discharge status: Home / Self Care | Payer: Medicare Other | Source: Ambulatory Visit | Attending: Radiation Oncology | Admitting: Radiation Oncology

## 2017-05-16 ENCOUNTER — Telehealth: Payer: Self-pay | Admitting: Internal Medicine

## 2017-05-16 DIAGNOSIS — I252 Old myocardial infarction: Secondary | ICD-10-CM | POA: Diagnosis not present

## 2017-05-16 DIAGNOSIS — I2510 Atherosclerotic heart disease of native coronary artery without angina pectoris: Secondary | ICD-10-CM | POA: Diagnosis not present

## 2017-05-16 DIAGNOSIS — I77811 Abdominal aortic ectasia: Secondary | ICD-10-CM | POA: Diagnosis not present

## 2017-05-16 DIAGNOSIS — E119 Type 2 diabetes mellitus without complications: Secondary | ICD-10-CM | POA: Diagnosis not present

## 2017-05-16 DIAGNOSIS — Z510 Encounter for antineoplastic radiation therapy: Secondary | ICD-10-CM | POA: Diagnosis not present

## 2017-05-16 DIAGNOSIS — C3411 Malignant neoplasm of upper lobe, right bronchus or lung: Secondary | ICD-10-CM | POA: Diagnosis not present

## 2017-05-16 NOTE — Telephone Encounter (Signed)
Per 12/17 los - patient was previously scheduled for the next three treatments.

## 2017-05-17 ENCOUNTER — Ambulatory Visit
Admission: RE | Admit: 2017-05-17 | Discharge: 2017-05-17 | Disposition: A | Discharge status: Home / Self Care | Payer: Medicare Other | Source: Ambulatory Visit | Attending: Radiation Oncology | Admitting: Radiation Oncology

## 2017-05-17 ENCOUNTER — Encounter: Payer: Self-pay | Admitting: *Deleted

## 2017-05-17 DIAGNOSIS — Z510 Encounter for antineoplastic radiation therapy: Secondary | ICD-10-CM | POA: Diagnosis not present

## 2017-05-17 DIAGNOSIS — I2510 Atherosclerotic heart disease of native coronary artery without angina pectoris: Secondary | ICD-10-CM | POA: Diagnosis not present

## 2017-05-17 DIAGNOSIS — E119 Type 2 diabetes mellitus without complications: Secondary | ICD-10-CM | POA: Diagnosis not present

## 2017-05-17 DIAGNOSIS — I77811 Abdominal aortic ectasia: Secondary | ICD-10-CM | POA: Diagnosis not present

## 2017-05-17 DIAGNOSIS — I252 Old myocardial infarction: Secondary | ICD-10-CM | POA: Diagnosis not present

## 2017-05-17 DIAGNOSIS — C3411 Malignant neoplasm of upper lobe, right bronchus or lung: Secondary | ICD-10-CM | POA: Diagnosis not present

## 2017-05-18 ENCOUNTER — Ambulatory Visit
Admission: RE | Admit: 2017-05-18 | Discharge: 2017-05-18 | Disposition: A | Discharge status: Home / Self Care | Payer: Medicare Other | Source: Ambulatory Visit | Attending: Radiation Oncology | Admitting: Radiation Oncology

## 2017-05-18 DIAGNOSIS — Z510 Encounter for antineoplastic radiation therapy: Secondary | ICD-10-CM | POA: Diagnosis not present

## 2017-05-18 DIAGNOSIS — E119 Type 2 diabetes mellitus without complications: Secondary | ICD-10-CM | POA: Diagnosis not present

## 2017-05-18 DIAGNOSIS — C3411 Malignant neoplasm of upper lobe, right bronchus or lung: Secondary | ICD-10-CM

## 2017-05-18 DIAGNOSIS — I2510 Atherosclerotic heart disease of native coronary artery without angina pectoris: Secondary | ICD-10-CM | POA: Diagnosis not present

## 2017-05-18 DIAGNOSIS — I252 Old myocardial infarction: Secondary | ICD-10-CM | POA: Diagnosis not present

## 2017-05-18 DIAGNOSIS — I77811 Abdominal aortic ectasia: Secondary | ICD-10-CM | POA: Diagnosis not present

## 2017-05-18 MED ORDER — SONAFINE EX EMUL
1.0000 "application " | Freq: Two times a day (BID) | CUTANEOUS | Status: DC
  Administered 2017-05-18: 1 via TOPICAL

## 2017-05-21 ENCOUNTER — Other Ambulatory Visit (HOSPITAL_BASED_OUTPATIENT_CLINIC_OR_DEPARTMENT_OTHER): Payer: Medicare Other

## 2017-05-21 ENCOUNTER — Telehealth: Payer: Self-pay | Admitting: Internal Medicine

## 2017-05-21 ENCOUNTER — Ambulatory Visit
Admission: RE | Admit: 2017-05-21 | Discharge: 2017-05-21 | Disposition: A | Discharge status: Home / Self Care | Payer: Medicare Other | Source: Ambulatory Visit | Attending: Radiation Oncology | Admitting: Radiation Oncology

## 2017-05-21 ENCOUNTER — Ambulatory Visit (HOSPITAL_BASED_OUTPATIENT_CLINIC_OR_DEPARTMENT_OTHER): Payer: Medicare Other

## 2017-05-21 VITALS — BP 141/82 | HR 93 | Temp 97.8°F | Resp 18

## 2017-05-21 DIAGNOSIS — C3491 Malignant neoplasm of unspecified part of right bronchus or lung: Secondary | ICD-10-CM | POA: Diagnosis present

## 2017-05-21 DIAGNOSIS — Z006 Encounter for examination for normal comparison and control in clinical research program: Secondary | ICD-10-CM

## 2017-05-21 DIAGNOSIS — Z5111 Encounter for antineoplastic chemotherapy: Secondary | ICD-10-CM | POA: Diagnosis not present

## 2017-05-21 DIAGNOSIS — I77811 Abdominal aortic ectasia: Secondary | ICD-10-CM | POA: Diagnosis not present

## 2017-05-21 DIAGNOSIS — I2510 Atherosclerotic heart disease of native coronary artery without angina pectoris: Secondary | ICD-10-CM | POA: Diagnosis not present

## 2017-05-21 DIAGNOSIS — I252 Old myocardial infarction: Secondary | ICD-10-CM | POA: Diagnosis not present

## 2017-05-21 DIAGNOSIS — Z510 Encounter for antineoplastic radiation therapy: Secondary | ICD-10-CM | POA: Diagnosis not present

## 2017-05-21 DIAGNOSIS — E119 Type 2 diabetes mellitus without complications: Secondary | ICD-10-CM | POA: Diagnosis not present

## 2017-05-21 DIAGNOSIS — C3411 Malignant neoplasm of upper lobe, right bronchus or lung: Secondary | ICD-10-CM | POA: Diagnosis not present

## 2017-05-21 LAB — CBC WITH DIFFERENTIAL/PLATELET
BASO%: 0.9 % (ref 0.0–2.0)
Basophils Absolute: 0.1 10*3/uL (ref 0.0–0.1)
EOS ABS: 0.3 10*3/uL (ref 0.0–0.5)
EOS%: 3.2 % (ref 0.0–7.0)
HCT: 39.6 % (ref 38.4–49.9)
HEMOGLOBIN: 13 g/dL (ref 13.0–17.1)
LYMPH#: 0.9 10*3/uL (ref 0.9–3.3)
LYMPH%: 10.7 % — ABNORMAL LOW (ref 14.0–49.0)
MCH: 27.4 pg (ref 27.2–33.4)
MCHC: 32.8 g/dL (ref 32.0–36.0)
MCV: 83.6 fL (ref 79.3–98.0)
MONO#: 0.5 10*3/uL (ref 0.1–0.9)
MONO%: 6 % (ref 0.0–14.0)
NEUT%: 79.2 % — ABNORMAL HIGH (ref 39.0–75.0)
NEUTROS ABS: 6.4 10*3/uL (ref 1.5–6.5)
PLATELETS: 357 10*3/uL (ref 140–400)
RBC: 4.74 10*6/uL (ref 4.20–5.82)
RDW: 14.3 % (ref 11.0–14.6)
WBC: 8.1 10*3/uL (ref 4.0–10.3)

## 2017-05-21 LAB — COMPREHENSIVE METABOLIC PANEL
ALT: 13 U/L (ref 0–55)
ANION GAP: 12 meq/L — AB (ref 3–11)
AST: 11 U/L (ref 5–34)
Albumin: 3 g/dL — ABNORMAL LOW (ref 3.5–5.0)
Alkaline Phosphatase: 63 U/L (ref 40–150)
BUN: 13.7 mg/dL (ref 7.0–26.0)
CALCIUM: 9 mg/dL (ref 8.4–10.4)
CHLORIDE: 96 meq/L — AB (ref 98–109)
CO2: 22 mEq/L (ref 22–29)
Creatinine: 0.8 mg/dL (ref 0.7–1.3)
Glucose: 232 mg/dl — ABNORMAL HIGH (ref 70–140)
POTASSIUM: 4.4 meq/L (ref 3.5–5.1)
Sodium: 130 mEq/L — ABNORMAL LOW (ref 136–145)
Total Bilirubin: 0.47 mg/dL (ref 0.20–1.20)
Total Protein: 7.3 g/dL (ref 6.4–8.3)

## 2017-05-21 LAB — RESEARCH LABS

## 2017-05-21 MED ORDER — DIPHENHYDRAMINE HCL 50 MG/ML IJ SOLN
INTRAMUSCULAR | Status: AC
  Filled 2017-05-21: qty 1

## 2017-05-21 MED ORDER — SODIUM CHLORIDE 0.9 % IV SOLN
45.0000 mg/m2 | Freq: Once | INTRAVENOUS | Status: AC
  Administered 2017-05-21: 96 mg via INTRAVENOUS
  Filled 2017-05-21: qty 16

## 2017-05-21 MED ORDER — FAMOTIDINE IN NACL 20-0.9 MG/50ML-% IV SOLN
20.0000 mg | Freq: Once | INTRAVENOUS | Status: AC
  Administered 2017-05-21: 20 mg via INTRAVENOUS

## 2017-05-21 MED ORDER — DIPHENHYDRAMINE HCL 50 MG/ML IJ SOLN
50.0000 mg | Freq: Once | INTRAMUSCULAR | Status: AC
  Administered 2017-05-21: 50 mg via INTRAVENOUS

## 2017-05-21 MED ORDER — PALONOSETRON HCL INJECTION 0.25 MG/5ML
INTRAVENOUS | Status: AC
  Filled 2017-05-21: qty 5

## 2017-05-21 MED ORDER — SODIUM CHLORIDE 0.9 % IV SOLN
Freq: Once | INTRAVENOUS | Status: AC
  Administered 2017-05-21: 12:00:00 via INTRAVENOUS

## 2017-05-21 MED ORDER — PALONOSETRON HCL INJECTION 0.25 MG/5ML
0.2500 mg | Freq: Once | INTRAVENOUS | Status: AC
  Administered 2017-05-21: 0.25 mg via INTRAVENOUS

## 2017-05-21 MED ORDER — SODIUM CHLORIDE 0.9 % IV SOLN
20.0000 mg | Freq: Once | INTRAVENOUS | Status: AC
  Administered 2017-05-21: 20 mg via INTRAVENOUS
  Filled 2017-05-21: qty 2

## 2017-05-21 MED ORDER — FAMOTIDINE IN NACL 20-0.9 MG/50ML-% IV SOLN
INTRAVENOUS | Status: AC
  Filled 2017-05-21: qty 50

## 2017-05-21 MED ORDER — SODIUM CHLORIDE 0.9 % IV SOLN
230.4000 mg | Freq: Once | INTRAVENOUS | Status: AC
  Administered 2017-05-21: 230 mg via INTRAVENOUS
  Filled 2017-05-21: qty 23

## 2017-05-21 NOTE — Patient Instructions (Signed)
   [facility] Discharge Instructions for Patients Receiving Chemotherapy  Today you received the following chemotherapy agents Taxol and Carboplatin   To help prevent nausea and vomiting after your treatment, we encourage you to take your nausea medication as directed.    If you develop nausea and vomiting that is not controlled by your nausea medication, call the clinic.   BELOW ARE SYMPTOMS THAT SHOULD BE REPORTED IMMEDIATELY:  *FEVER GREATER THAN 100.5 F  *CHILLS WITH OR WITHOUT FEVER  NAUSEA AND VOMITING THAT IS NOT CONTROLLED WITH YOUR NAUSEA MEDICATION  *UNUSUAL SHORTNESS OF BREATH  *UNUSUAL BRUISING OR BLEEDING  TENDERNESS IN MOUTH AND THROAT WITH OR WITHOUT PRESENCE OF ULCERS  *URINARY PROBLEMS  *BOWEL PROBLEMS  UNUSUAL RASH Items with * indicate a potential emergency and should be followed up as soon as possible.  Feel free to call the clinic should you have any questions or concerns. The clinic phone number is [phone].  Please show the CHEMO ALERT CARD at check-in to the Emergency Department and triage nurse.   

## 2017-05-21 NOTE — Telephone Encounter (Signed)
Called pt Reviewed notes  BP hsa been running low   HR 100s He denies dizzienss No bleeding  In week 3 of XRT He has cut back on irbisartan  Recom: HR increase prob related to lung/onc problems  Stop irbisartan Can increase metoprolol to 25 bid    F/U HR and BP  Call if dizzy Keep on other meds for now. Keep appt on 1/9

## 2017-05-23 ENCOUNTER — Ambulatory Visit
Admission: RE | Admit: 2017-05-23 | Discharge: 2017-05-23 | Disposition: A | Discharge status: Home / Self Care | Payer: Medicare Other | Source: Ambulatory Visit | Attending: Radiation Oncology | Admitting: Radiation Oncology

## 2017-05-23 DIAGNOSIS — E119 Type 2 diabetes mellitus without complications: Secondary | ICD-10-CM | POA: Diagnosis not present

## 2017-05-23 DIAGNOSIS — Z510 Encounter for antineoplastic radiation therapy: Secondary | ICD-10-CM | POA: Diagnosis not present

## 2017-05-23 DIAGNOSIS — I2510 Atherosclerotic heart disease of native coronary artery without angina pectoris: Secondary | ICD-10-CM | POA: Diagnosis not present

## 2017-05-23 DIAGNOSIS — I77811 Abdominal aortic ectasia: Secondary | ICD-10-CM | POA: Diagnosis not present

## 2017-05-23 DIAGNOSIS — C3411 Malignant neoplasm of upper lobe, right bronchus or lung: Secondary | ICD-10-CM | POA: Diagnosis not present

## 2017-05-23 DIAGNOSIS — I252 Old myocardial infarction: Secondary | ICD-10-CM | POA: Diagnosis not present

## 2017-05-24 ENCOUNTER — Other Ambulatory Visit: Payer: Self-pay | Admitting: Radiation Oncology

## 2017-05-24 ENCOUNTER — Ambulatory Visit
Admission: RE | Admit: 2017-05-24 | Discharge: 2017-05-24 | Disposition: A | Discharge status: Home / Self Care | Payer: Medicare Other | Source: Ambulatory Visit | Attending: Radiation Oncology | Admitting: Radiation Oncology

## 2017-05-24 ENCOUNTER — Encounter: Payer: Self-pay | Admitting: *Deleted

## 2017-05-24 ENCOUNTER — Telehealth: Payer: Self-pay | Admitting: Internal Medicine

## 2017-05-24 DIAGNOSIS — I77811 Abdominal aortic ectasia: Secondary | ICD-10-CM | POA: Diagnosis not present

## 2017-05-24 DIAGNOSIS — I252 Old myocardial infarction: Secondary | ICD-10-CM | POA: Diagnosis not present

## 2017-05-24 DIAGNOSIS — E119 Type 2 diabetes mellitus without complications: Secondary | ICD-10-CM | POA: Diagnosis not present

## 2017-05-24 DIAGNOSIS — C3411 Malignant neoplasm of upper lobe, right bronchus or lung: Secondary | ICD-10-CM | POA: Diagnosis not present

## 2017-05-24 DIAGNOSIS — I2510 Atherosclerotic heart disease of native coronary artery without angina pectoris: Secondary | ICD-10-CM | POA: Diagnosis not present

## 2017-05-24 DIAGNOSIS — Z510 Encounter for antineoplastic radiation therapy: Secondary | ICD-10-CM | POA: Diagnosis not present

## 2017-05-24 MED ORDER — METOPROLOL SUCCINATE ER 25 MG PO TB24: 25.0000 mg | ORAL_TABLET | Freq: Two times a day (BID) | ORAL | 3 refills | Status: DC

## 2017-05-24 MED ORDER — HYDROCOD POLST-CPM POLST ER 10-8 MG/5ML PO SUER: 5.0000 mL | Freq: Two times a day (BID) | ORAL | 0 refills | Status: DC | PRN

## 2017-05-24 NOTE — Progress Notes (Signed)
[place] with Mr. Follansbee made aware that his cough medication will be called into his pharmacy this afternoon by Shona Simpson, PA-C.

## 2017-05-24 NOTE — Progress Notes (Signed)
1340 Copy of medication refill request placed on  Shona Simpson, PA-C desk. 1400 Will Call medication into the pharmacy for cough.

## 2017-05-24 NOTE — Progress Notes (Signed)
1330 Returned call about cough medication refill told I will have Shona Simpson, PA-C look at his request and  will get back in touch with him later this afternoon.

## 2017-05-24 NOTE — Telephone Encounter (Signed)
Patient returning call.

## 2017-05-24 NOTE — Telephone Encounter (Signed)
Left message to call back  

## 2017-05-24 NOTE — Telephone Encounter (Signed)
Fay Records, MD      8:40 AM  Note    Called pt Reviewed notes  BP hsa been running low   HR 100s He denies dizzienss No bleeding  In week 3 of XRT He has cut back on irbisartan  Recom: HR increase prob related to lung/onc problems  Stop irbisartan Can increase metoprolol to 25 bid    F/U HR and BP  Call if dizzy Keep on other meds for now. Keep appt on 1/9     Called patient back with Dr. Harrington Challenger' recommendations. Patient verbalized understanding.

## 2017-05-25 ENCOUNTER — Ambulatory Visit
Admission: RE | Admit: 2017-05-25 | Discharge: 2017-05-25 | Disposition: A | Discharge status: Home / Self Care | Payer: Medicare Other | Source: Ambulatory Visit | Attending: Radiation Oncology | Admitting: Radiation Oncology

## 2017-05-25 DIAGNOSIS — Z510 Encounter for antineoplastic radiation therapy: Secondary | ICD-10-CM | POA: Diagnosis not present

## 2017-05-25 DIAGNOSIS — I252 Old myocardial infarction: Secondary | ICD-10-CM | POA: Diagnosis not present

## 2017-05-25 DIAGNOSIS — I77811 Abdominal aortic ectasia: Secondary | ICD-10-CM | POA: Diagnosis not present

## 2017-05-25 DIAGNOSIS — C3411 Malignant neoplasm of upper lobe, right bronchus or lung: Secondary | ICD-10-CM | POA: Diagnosis not present

## 2017-05-25 DIAGNOSIS — I2510 Atherosclerotic heart disease of native coronary artery without angina pectoris: Secondary | ICD-10-CM | POA: Diagnosis not present

## 2017-05-25 DIAGNOSIS — E119 Type 2 diabetes mellitus without complications: Secondary | ICD-10-CM | POA: Diagnosis not present

## 2017-05-28 ENCOUNTER — Ambulatory Visit
Admission: RE | Admit: 2017-05-28 | Discharge: 2017-05-28 | Disposition: A | Discharge status: Home / Self Care | Payer: Medicare Other | Source: Ambulatory Visit | Attending: Radiation Oncology | Admitting: Radiation Oncology

## 2017-05-28 ENCOUNTER — Telehealth: Payer: Self-pay | Admitting: *Deleted

## 2017-05-28 ENCOUNTER — Telehealth: Payer: Self-pay | Admitting: Internal Medicine

## 2017-05-28 ENCOUNTER — Ambulatory Visit (HOSPITAL_BASED_OUTPATIENT_CLINIC_OR_DEPARTMENT_OTHER): Payer: Medicare Other

## 2017-05-28 ENCOUNTER — Encounter: Payer: Self-pay | Admitting: Internal Medicine

## 2017-05-28 ENCOUNTER — Other Ambulatory Visit (HOSPITAL_BASED_OUTPATIENT_CLINIC_OR_DEPARTMENT_OTHER): Payer: Medicare Other

## 2017-05-28 ENCOUNTER — Ambulatory Visit (HOSPITAL_BASED_OUTPATIENT_CLINIC_OR_DEPARTMENT_OTHER): Payer: Medicare Other | Admitting: Internal Medicine

## 2017-05-28 VITALS — BP 154/89 | HR 95 | Temp 97.6°F | Resp 18 | Ht 71.0 in | Wt 206.3 lb

## 2017-05-28 DIAGNOSIS — C3411 Malignant neoplasm of upper lobe, right bronchus or lung: Secondary | ICD-10-CM

## 2017-05-28 DIAGNOSIS — I77811 Abdominal aortic ectasia: Secondary | ICD-10-CM | POA: Diagnosis not present

## 2017-05-28 DIAGNOSIS — I252 Old myocardial infarction: Secondary | ICD-10-CM | POA: Diagnosis not present

## 2017-05-28 DIAGNOSIS — Z5111 Encounter for antineoplastic chemotherapy: Secondary | ICD-10-CM

## 2017-05-28 DIAGNOSIS — Z510 Encounter for antineoplastic radiation therapy: Secondary | ICD-10-CM | POA: Diagnosis not present

## 2017-05-28 DIAGNOSIS — E119 Type 2 diabetes mellitus without complications: Secondary | ICD-10-CM | POA: Diagnosis not present

## 2017-05-28 DIAGNOSIS — C3491 Malignant neoplasm of unspecified part of right bronchus or lung: Secondary | ICD-10-CM

## 2017-05-28 DIAGNOSIS — I2510 Atherosclerotic heart disease of native coronary artery without angina pectoris: Secondary | ICD-10-CM | POA: Diagnosis not present

## 2017-05-28 LAB — COMPREHENSIVE METABOLIC PANEL
ALK PHOS: 68 U/L (ref 40–150)
ALT: 16 U/L (ref 0–55)
ANION GAP: 8 meq/L (ref 3–11)
AST: 13 U/L (ref 5–34)
Albumin: 2.9 g/dL — ABNORMAL LOW (ref 3.5–5.0)
BILIRUBIN TOTAL: 0.4 mg/dL (ref 0.20–1.20)
BUN: 9.3 mg/dL (ref 7.0–26.0)
CALCIUM: 8.5 mg/dL (ref 8.4–10.4)
CO2: 24 meq/L (ref 22–29)
CREATININE: 0.8 mg/dL (ref 0.7–1.3)
Chloride: 101 mEq/L (ref 98–109)
Glucose: 197 mg/dl — ABNORMAL HIGH (ref 70–140)
Potassium: 4.4 mEq/L (ref 3.5–5.1)
Sodium: 133 mEq/L — ABNORMAL LOW (ref 136–145)
TOTAL PROTEIN: 6.6 g/dL (ref 6.4–8.3)

## 2017-05-28 LAB — CBC WITH DIFFERENTIAL/PLATELET
BASO%: 1.2 % (ref 0.0–2.0)
BASOS ABS: 0.1 10*3/uL (ref 0.0–0.1)
EOS%: 4.7 % (ref 0.0–7.0)
Eosinophils Absolute: 0.2 10*3/uL (ref 0.0–0.5)
HEMATOCRIT: 37.5 % — AB (ref 38.4–49.9)
HEMOGLOBIN: 12.1 g/dL — AB (ref 13.0–17.1)
LYMPH#: 0.7 10*3/uL — AB (ref 0.9–3.3)
LYMPH%: 17.3 % (ref 14.0–49.0)
MCH: 27.7 pg (ref 27.2–33.4)
MCHC: 32.3 g/dL (ref 32.0–36.0)
MCV: 85.8 fL (ref 79.3–98.0)
MONO#: 0.5 10*3/uL (ref 0.1–0.9)
MONO%: 11.2 % (ref 0.0–14.0)
NEUT%: 65.6 % (ref 39.0–75.0)
NEUTROS ABS: 2.8 10*3/uL (ref 1.5–6.5)
Platelets: 241 10*3/uL (ref 140–400)
RBC: 4.37 10*6/uL (ref 4.20–5.82)
RDW: 14.7 % — AB (ref 11.0–14.6)
WBC: 4.3 10*3/uL (ref 4.0–10.3)

## 2017-05-28 MED ORDER — FAMOTIDINE IN NACL 20-0.9 MG/50ML-% IV SOLN
INTRAVENOUS | Status: AC
  Filled 2017-05-28: qty 50

## 2017-05-28 MED ORDER — DIPHENHYDRAMINE HCL 50 MG/ML IJ SOLN
50.0000 mg | Freq: Once | INTRAMUSCULAR | Status: AC
Start: 2017-05-28 — End: 2017-05-28
  Administered 2017-05-28: 50 mg via INTRAVENOUS

## 2017-05-28 MED ORDER — DIPHENHYDRAMINE HCL 50 MG/ML IJ SOLN
INTRAMUSCULAR | Status: AC
  Filled 2017-05-28: qty 1

## 2017-05-28 MED ORDER — SODIUM CHLORIDE 0.9 % IV SOLN
230.4000 mg | Freq: Once | INTRAVENOUS | Status: AC
  Administered 2017-05-28: 230 mg via INTRAVENOUS
  Filled 2017-05-28: qty 23

## 2017-05-28 MED ORDER — SODIUM CHLORIDE 0.9 % IV SOLN
20.0000 mg | Freq: Once | INTRAVENOUS | Status: AC
  Administered 2017-05-28: 20 mg via INTRAVENOUS
  Filled 2017-05-28: qty 2

## 2017-05-28 MED ORDER — SODIUM CHLORIDE 0.9 % IV SOLN
Freq: Once | INTRAVENOUS | Status: AC
  Administered 2017-05-28: 12:00:00 via INTRAVENOUS

## 2017-05-28 MED ORDER — FAMOTIDINE IN NACL 20-0.9 MG/50ML-% IV SOLN
20.0000 mg | Freq: Once | INTRAVENOUS | Status: AC
  Administered 2017-05-28: 20 mg via INTRAVENOUS

## 2017-05-28 MED ORDER — PALONOSETRON HCL INJECTION 0.25 MG/5ML
0.2500 mg | Freq: Once | INTRAVENOUS | Status: AC
Start: 2017-05-28 — End: 2017-05-28
  Administered 2017-05-28: 0.25 mg via INTRAVENOUS

## 2017-05-28 MED ORDER — PALONOSETRON HCL INJECTION 0.25 MG/5ML
INTRAVENOUS | Status: AC
Start: 2017-05-28 — End: ?
  Filled 2017-05-28: qty 5

## 2017-05-28 MED ORDER — SODIUM CHLORIDE 0.9 % IV SOLN
45.0000 mg/m2 | Freq: Once | INTRAVENOUS | Status: AC
  Administered 2017-05-28: 96 mg via INTRAVENOUS
  Filled 2017-05-28: qty 16

## 2017-05-28 NOTE — Telephone Encounter (Signed)
rx for hydrocodone tussinex is ready to be picked up as well as his carafate 12:14 PM

## 2017-05-28 NOTE — Patient Instructions (Signed)
   [facility] Discharge Instructions for Patients Receiving Chemotherapy  Today you received the following chemotherapy agents Taxol and Carboplatin   To help prevent nausea and vomiting after your treatment, we encourage you to take your nausea medication as directed.    If you develop nausea and vomiting that is not controlled by your nausea medication, call the clinic.   BELOW ARE SYMPTOMS THAT SHOULD BE REPORTED IMMEDIATELY:  *FEVER GREATER THAN 100.5 F  *CHILLS WITH OR WITHOUT FEVER  NAUSEA AND VOMITING THAT IS NOT CONTROLLED WITH YOUR NAUSEA MEDICATION  *UNUSUAL SHORTNESS OF BREATH  *UNUSUAL BRUISING OR BLEEDING  TENDERNESS IN MOUTH AND THROAT WITH OR WITHOUT PRESENCE OF ULCERS  *URINARY PROBLEMS  *BOWEL PROBLEMS  UNUSUAL RASH Items with * indicate a potential emergency and should be followed up as soon as possible.  Feel free to call the clinic should you have any questions or concerns. The clinic phone number is [phone].  Please show the CHEMO ALERT CARD at check-in to the Emergency Department and triage nurse.   

## 2017-05-28 NOTE — Telephone Encounter (Signed)
Added remaining weekly appointment. Date of 1/28 last day in care plan. Spoke with patient. He is aware and will get new schedule at 1/7 appointment.

## 2017-05-28 NOTE — Progress Notes (Signed)
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  PRIOR THERAPY: None.  CURRENT THERAPY: A course of concurrent chemoradiation with weekly carboplatin for AC of 2 and paclitaxel 45 mg/M2.  Status post 2 cycles.  INTERVAL HISTORY: Randy [name] 71 y.o. male returns to the clinic today for follow-up visit accompanied by his wife.  The patient is feeling fine today with no specific complaints.  He continues to tolerate his treatment fairly well with no significant adverse effects.  He denied having any nausea, vomiting, diarrhea or constipation.  He denied having any fever or chills.  He has no chest pain, shortness breath, cough or hemoptysis.  The patient denied having any weight loss or night sweats.  He is here today for evaluation before starting cycle #3.   MEDICAL HISTORY: Past Medical History:  Diagnosis Date  . 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 Repeat US due 2023  . Carotid arterial disease ([place])    a. S/p R CEA;  b. 05/2015 Carotid U/S: RICA 2-58%, LICA 52-77%.  . Coronary artery disease    a. 2012 MI in [place] w/ PCI  //  b. 04/2013 MV: inflat scar, no ischemia.  //  c.   NSTEMI 5/17 >> Promus DES to LCx and POBA to OM1 (ISR).   . Diabetes mellitus without complication ([place])   . Grade II diastolic dysfunction 82/42/3536  . History of cardiac catheterization 10/21/15   a. NSTEMI 5/17: oLAD 40, pLCx 90 (Promus DES), OM2 90 ISR (POBA), oRCA 25   . History of echocardiogram    a. Mild LVH, EF 50-55%, possible inferolateral HK, grade 2  diastolic dysfunction, trivial AI, MAC  . History of nuclear stress test 04/2013   a. Myoview 12/14: Intermediate risk, prior inferolateral MI, no ischemia, EF 45%  . Hx of leukocytosis   . Hyperlipidemia   . Hypertension   . Hypertensive heart disease   . Morbid obesity ([place])   . Tobacco abuse     ALLERGIES:  is allergic to brilinta [ticagrelor].  MEDICATIONS:  Current Outpatient Medications  Medication Sig Dispense Refill  . aspirin EC 81 MG tablet Take 81 mg by mouth daily.    [name] atorvastatin (LIPITOR) 40 MG tablet Take 1 tablet (40 mg total) by mouth daily. 90 tablet 3  . benzonatate (TESSALON) 200 MG capsule     . clopidogrel (PLAVIX) 75 MG tablet Take 1 tablet (75 mg total) by mouth daily. 90 tablet 3  . metFORMIN (GLUCOPHAGE) 500 MG tablet Take 0.5 tablets (250 mg total) by mouth 2 (two) times daily with a meal. 90 tablet 0  . metoprolol succinate (TOPROL-XL) 25 MG 24 hr tablet Take 1 tablet (25 mg total) by mouth 2 (two) times daily. 180 tablet 3  . VENTOLIN HFA 108 (90 Base) MCG/ACT inhaler Inhale 1 puff every 4 (four) hours as needed into the lungs for wheezing or shortness of breath. 1 Inhaler 5  . chlorpheniramine-HYDROcodone (TUSSIONEX PENNKINETIC ER) 10-8 MG/5ML SUER Take 5 mLs by mouth every 12 (twelve) hours as needed for cough. (Patient not taking: Reported on 05/28/2017) 140  mL 0  . prochlorperazine (COMPAZINE) 10 MG tablet Take 1 tablet (10 mg total) by mouth every 6 (six) hours as needed for nausea or vomiting. (Patient not taking: Reported on 05/01/2017) 30 tablet 2   No current facility-administered medications for this visit.     SURGICAL HISTORY:  Past Surgical History:  Procedure Laterality Date  . CARDIAC CATHETERIZATION    . CARDIAC CATHETERIZATION N/A 10/21/2015   Procedure: Left Heart Cath and Coronary Angiography;  Surgeon: [name], MD;  Location: [facility];  Service: Cardiovascular;  Laterality: N/A;  . CARDIAC CATHETERIZATION N/A  10/21/2015   Procedure: Coronary Stent Intervention;  Surgeon: [name], MD;  Location: [facility];  Service: Cardiovascular;  Laterality: N/A;  . CARDIAC CATHETERIZATION N/A 10/21/2015   Procedure: Coronary Balloon Angioplasty;  Surgeon: [name], MD;  Location: [facility];  Service: Cardiovascular;  Laterality: N/A;  . CAROTID ENDARTERECTOMY    . CORONARY ANGIOPLASTY    . CORONARY STENT PLACEMENT    . VIDEO BRONCHOSCOPY Bilateral 04/03/2017   Procedure: VIDEO BRONCHOSCOPY WITH FLUORO;  Surgeon: [name], MD;  Location: [facility];  Service: Cardiopulmonary;  Laterality: Bilateral;    REVIEW OF SYSTEMS:  A comprehensive review of systems was negative except for: Constitutional: positive for fatigue   PHYSICAL EXAMINATION: General appearance: alert, cooperative, fatigued and no distress Head: Normocephalic, without obvious abnormality, atraumatic Neck: no adenopathy, no JVD, supple, symmetrical, trachea midline and thyroid not enlarged, symmetric, no tenderness/mass/nodules Lymph nodes: Cervical, supraclavicular, and axillary nodes normal. Resp: clear to auscultation bilaterally Back: symmetric, no curvature. ROM normal. No CVA tenderness. Cardio: regular rate and rhythm, S1, S2 normal, no murmur, click, rub or gallop GI: soft, non-tender; bowel sounds normal; no masses,  no organomegaly Extremities: extremities normal, atraumatic, no cyanosis or edema  ECOG PERFORMANCE STATUS: 1 - Symptomatic but completely ambulatory  Blood pressure (!) 154/89, pulse 95, temperature 97.6 F (36.4 C), temperature source Oral, resp. rate 18, height _0  (1.803 m), weight 206 lb 4.8 oz (93.6 kg), SpO2 98 %.  LABORATORY DATA: Lab Results  Component Value Date   WBC 4.3 05/28/2017   HGB 12.1 (L) 05/28/2017   HCT 37.5 (L) 05/28/2017   MCV 85.8 05/28/2017   PLT 241 05/28/2017      Chemistry      Component Value Date/Time   NA 133 (L) 05/28/2017 0932    K 4.4 05/28/2017 0932   CL 99 10/20/2016 1508   CO2 24 05/28/2017 0932   BUN 9.3 05/28/2017 0932   CREATININE 0.8 05/28/2017 0932      Component Value Date/Time   CALCIUM 8.5 05/28/2017 0932   ALKPHOS 68 05/28/2017 0932   AST 13 05/28/2017 0932   ALT 16 05/28/2017 0932   BILITOT 0.40 05/28/2017 0932       RADIOGRAPHIC STUDIES: No results found.  ASSESSMENT AND PLAN: This is a very pleasant 71 years old white male with a stage IIIb non-small cell lung cancer, squamous cell carcinoma. The patient is currently undergoing a course of concurrent chemoradiation with weekly carboplatin and paclitaxel.   He tolerated the last 2 weeks of his treatment fairly well with no significant adverse effects. I recommended for the patient to proceed with cycle #3 today as a scheduled. I will see him back for follow-up visit in 2 weeks for evaluation before starting cycle #5. He was advised to call immediately if he has any concerning symptoms in the interval.  The patient voices understanding of current disease status and treatment options and is in agreement with the current care plan. All questions were answered. The patient knows to call the clinic with any problems, questions or concerns. We can certainly see the patient much sooner if necessary.  I spent 10 minutes counseling the patient face to face. The total time spent in the appointment was 15 minutes.  Disclaimer: This note was dictated with voice recognition software. Similar sounding words can inadvertently be transcribed and may not be corrected upon review.

## 2017-05-30 ENCOUNTER — Ambulatory Visit
Admission: RE | Admit: 2017-05-30 | Discharge: 2017-05-30 | Disposition: A | Discharge status: Home / Self Care | Payer: Medicare Other | Source: Ambulatory Visit | Attending: Radiation Oncology | Admitting: Radiation Oncology

## 2017-05-30 DIAGNOSIS — E785 Hyperlipidemia, unspecified: Secondary | ICD-10-CM | POA: Diagnosis not present

## 2017-05-30 DIAGNOSIS — Z79899 Other long term (current) drug therapy: Secondary | ICD-10-CM | POA: Diagnosis not present

## 2017-05-30 DIAGNOSIS — I2510 Atherosclerotic heart disease of native coronary artery without angina pectoris: Secondary | ICD-10-CM | POA: Diagnosis not present

## 2017-05-30 DIAGNOSIS — Z888 Allergy status to other drugs, medicaments and biological substances status: Secondary | ICD-10-CM | POA: Diagnosis not present

## 2017-05-30 DIAGNOSIS — Z7984 Long term (current) use of oral hypoglycemic drugs: Secondary | ICD-10-CM | POA: Diagnosis not present

## 2017-05-30 DIAGNOSIS — Z7952 Long term (current) use of systemic steroids: Secondary | ICD-10-CM | POA: Diagnosis not present

## 2017-05-30 DIAGNOSIS — C3411 Malignant neoplasm of upper lobe, right bronchus or lung: Secondary | ICD-10-CM | POA: Diagnosis not present

## 2017-05-30 DIAGNOSIS — Z8249 Family history of ischemic heart disease and other diseases of the circulatory system: Secondary | ICD-10-CM | POA: Diagnosis not present

## 2017-05-30 DIAGNOSIS — Z510 Encounter for antineoplastic radiation therapy: Secondary | ICD-10-CM | POA: Diagnosis not present

## 2017-05-30 DIAGNOSIS — I252 Old myocardial infarction: Secondary | ICD-10-CM | POA: Diagnosis not present

## 2017-05-30 DIAGNOSIS — I10 Essential (primary) hypertension: Secondary | ICD-10-CM | POA: Diagnosis not present

## 2017-05-30 DIAGNOSIS — E119 Type 2 diabetes mellitus without complications: Secondary | ICD-10-CM | POA: Diagnosis not present

## 2017-05-30 DIAGNOSIS — Z7982 Long term (current) use of aspirin: Secondary | ICD-10-CM | POA: Diagnosis not present

## 2017-05-30 DIAGNOSIS — Z9889 Other specified postprocedural states: Secondary | ICD-10-CM | POA: Diagnosis not present

## 2017-05-30 DIAGNOSIS — I77811 Abdominal aortic ectasia: Secondary | ICD-10-CM | POA: Diagnosis not present

## 2017-05-30 DIAGNOSIS — Z7902 Long term (current) use of antithrombotics/antiplatelets: Secondary | ICD-10-CM | POA: Diagnosis not present

## 2017-05-31 ENCOUNTER — Ambulatory Visit
Admission: RE | Admit: 2017-05-31 | Discharge: 2017-05-31 | Disposition: A | Discharge status: Home / Self Care | Payer: Medicare Other | Source: Ambulatory Visit | Attending: Radiation Oncology | Admitting: Radiation Oncology

## 2017-05-31 DIAGNOSIS — I2510 Atherosclerotic heart disease of native coronary artery without angina pectoris: Secondary | ICD-10-CM | POA: Diagnosis not present

## 2017-05-31 DIAGNOSIS — I77811 Abdominal aortic ectasia: Secondary | ICD-10-CM | POA: Diagnosis not present

## 2017-05-31 DIAGNOSIS — E119 Type 2 diabetes mellitus without complications: Secondary | ICD-10-CM | POA: Diagnosis not present

## 2017-05-31 DIAGNOSIS — C3411 Malignant neoplasm of upper lobe, right bronchus or lung: Secondary | ICD-10-CM | POA: Diagnosis not present

## 2017-05-31 DIAGNOSIS — I252 Old myocardial infarction: Secondary | ICD-10-CM | POA: Diagnosis not present

## 2017-05-31 DIAGNOSIS — Z510 Encounter for antineoplastic radiation therapy: Secondary | ICD-10-CM | POA: Diagnosis not present

## 2017-06-01 ENCOUNTER — Ambulatory Visit
Admission: RE | Admit: 2017-06-01 | Discharge: 2017-06-01 | Disposition: A | Discharge status: Home / Self Care | Payer: Medicare Other | Source: Ambulatory Visit | Attending: Radiation Oncology | Admitting: Radiation Oncology

## 2017-06-01 DIAGNOSIS — I2510 Atherosclerotic heart disease of native coronary artery without angina pectoris: Secondary | ICD-10-CM | POA: Diagnosis not present

## 2017-06-01 DIAGNOSIS — E119 Type 2 diabetes mellitus without complications: Secondary | ICD-10-CM | POA: Diagnosis not present

## 2017-06-01 DIAGNOSIS — I252 Old myocardial infarction: Secondary | ICD-10-CM | POA: Diagnosis not present

## 2017-06-01 DIAGNOSIS — I77811 Abdominal aortic ectasia: Secondary | ICD-10-CM | POA: Diagnosis not present

## 2017-06-01 DIAGNOSIS — Z510 Encounter for antineoplastic radiation therapy: Secondary | ICD-10-CM | POA: Diagnosis not present

## 2017-06-01 DIAGNOSIS — C3411 Malignant neoplasm of upper lobe, right bronchus or lung: Secondary | ICD-10-CM | POA: Diagnosis not present

## 2017-06-04 ENCOUNTER — Inpatient Hospital Stay: Payer: Medicare Other | Attending: Internal Medicine

## 2017-06-04 ENCOUNTER — Ambulatory Visit
Admission: RE | Admit: 2017-06-04 | Discharge: 2017-06-04 | Disposition: A | Discharge status: Home / Self Care | Payer: Medicare Other | Source: Ambulatory Visit | Attending: Radiation Oncology | Admitting: Radiation Oncology

## 2017-06-04 ENCOUNTER — Inpatient Hospital Stay: Payer: Medicare Other

## 2017-06-04 VITALS — BP 135/85 | HR 87 | Temp 97.7°F | Resp 18

## 2017-06-04 DIAGNOSIS — I10 Essential (primary) hypertension: Secondary | ICD-10-CM | POA: Diagnosis not present

## 2017-06-04 DIAGNOSIS — C3411 Malignant neoplasm of upper lobe, right bronchus or lung: Secondary | ICD-10-CM | POA: Diagnosis not present

## 2017-06-04 DIAGNOSIS — Z5111 Encounter for antineoplastic chemotherapy: Secondary | ICD-10-CM | POA: Insufficient documentation

## 2017-06-04 DIAGNOSIS — I2510 Atherosclerotic heart disease of native coronary artery without angina pectoris: Secondary | ICD-10-CM | POA: Diagnosis not present

## 2017-06-04 DIAGNOSIS — F419 Anxiety disorder, unspecified: Secondary | ICD-10-CM | POA: Insufficient documentation

## 2017-06-04 DIAGNOSIS — I252 Old myocardial infarction: Secondary | ICD-10-CM | POA: Diagnosis not present

## 2017-06-04 DIAGNOSIS — L819 Disorder of pigmentation, unspecified: Secondary | ICD-10-CM | POA: Diagnosis not present

## 2017-06-04 DIAGNOSIS — Z510 Encounter for antineoplastic radiation therapy: Secondary | ICD-10-CM | POA: Diagnosis not present

## 2017-06-04 DIAGNOSIS — Z87891 Personal history of nicotine dependence: Secondary | ICD-10-CM | POA: Diagnosis not present

## 2017-06-04 DIAGNOSIS — E119 Type 2 diabetes mellitus without complications: Secondary | ICD-10-CM | POA: Diagnosis not present

## 2017-06-04 DIAGNOSIS — I77811 Abdominal aortic ectasia: Secondary | ICD-10-CM | POA: Diagnosis not present

## 2017-06-04 DIAGNOSIS — C3491 Malignant neoplasm of unspecified part of right bronchus or lung: Secondary | ICD-10-CM

## 2017-06-04 LAB — COMPREHENSIVE METABOLIC PANEL
ALK PHOS: 80 U/L (ref 40–150)
ALT: 25 U/L (ref 0–55)
AST: 16 U/L (ref 5–34)
Albumin: 3.3 g/dL — ABNORMAL LOW (ref 3.5–5.0)
Anion gap: 8 (ref 3–11)
BUN: 9 mg/dL (ref 7–26)
CALCIUM: 9 mg/dL (ref 8.4–10.4)
CHLORIDE: 101 mmol/L (ref 98–109)
CO2: 26 mmol/L (ref 22–29)
CREATININE: 0.79 mg/dL (ref 0.70–1.30)
GFR calc Af Amer: >60 mL/min (ref >60)
Glucose, Bld: 129 mg/dL (ref 70–140)
Potassium: 4.7 mmol/L (ref 3.5–5.1)
Sodium: 135 mmol/L — ABNORMAL LOW (ref 136–145)
Total Bilirubin: 0.6 mg/dL (ref 0.2–1.2)
Total Protein: 7.1 g/dL (ref 6.4–8.3)

## 2017-06-04 LAB — CBC WITH DIFFERENTIAL/PLATELET
Abs Granulocyte: 2.7 10*3/uL (ref 1.5–6.5)
Basophils Absolute: 0 10*3/uL (ref 0.0–0.1)
Basophils Relative: 1 %
EOS ABS: 0.1 10*3/uL (ref 0.0–0.5)
Eosinophils Relative: 3 %
HCT: 40 % (ref 38.4–49.9)
Hemoglobin: 13 g/dL (ref 13.0–17.1)
LYMPHS ABS: 0.7 10*3/uL — AB (ref 0.9–3.3)
LYMPHS PCT: 19 %
MCH: 27.6 pg (ref 27.2–33.4)
MCHC: 32.4 g/dL (ref 32.0–36.0)
MCV: 85.2 fL (ref 79.3–98.0)
MONO ABS: 0.4 10*3/uL (ref 0.1–0.9)
Monocytes Relative: 10 %
Neutro Abs: 2.7 10*3/uL (ref 1.5–6.5)
Neutrophils Relative %: 67 %
PLATELETS: 232 10*3/uL (ref 140–400)
RBC: 4.7 MIL/uL (ref 4.20–5.82)
RDW: 15.3 % (ref 11.0–15.6)
WBC: 3.9 10*3/uL — ABNORMAL LOW (ref 4.0–10.3)

## 2017-06-04 MED ORDER — PALONOSETRON HCL INJECTION 0.25 MG/5ML
INTRAVENOUS | Status: AC
  Filled 2017-06-04: qty 5

## 2017-06-04 MED ORDER — DIPHENHYDRAMINE HCL 50 MG/ML IJ SOLN
50.0000 mg | Freq: Once | INTRAMUSCULAR | Status: AC
  Administered 2017-06-04: 50 mg via INTRAVENOUS

## 2017-06-04 MED ORDER — FAMOTIDINE IN NACL 20-0.9 MG/50ML-% IV SOLN
20.0000 mg | Freq: Once | INTRAVENOUS | Status: AC
  Administered 2017-06-04: 20 mg via INTRAVENOUS

## 2017-06-04 MED ORDER — PALONOSETRON HCL INJECTION 0.25 MG/5ML
0.2500 mg | Freq: Once | INTRAVENOUS | Status: AC
  Administered 2017-06-04: 0.25 mg via INTRAVENOUS

## 2017-06-04 MED ORDER — DIPHENHYDRAMINE HCL 50 MG/ML IJ SOLN
INTRAMUSCULAR | Status: AC
  Filled 2017-06-04: qty 1

## 2017-06-04 MED ORDER — SODIUM CHLORIDE 0.9 % IV SOLN
20.0000 mg | Freq: Once | INTRAVENOUS | Status: AC
  Administered 2017-06-04: 20 mg via INTRAVENOUS
  Filled 2017-06-04: qty 2

## 2017-06-04 MED ORDER — CARBOPLATIN CHEMO INJECTION 450 MG/45ML
230.4000 mg | Freq: Once | INTRAVENOUS | Status: AC
  Administered 2017-06-04: 230 mg via INTRAVENOUS
  Filled 2017-06-04: qty 23

## 2017-06-04 MED ORDER — SODIUM CHLORIDE 0.9 % IV SOLN
45.0000 mg/m2 | Freq: Once | INTRAVENOUS | Status: AC
  Administered 2017-06-04: 96 mg via INTRAVENOUS
  Filled 2017-06-04: qty 16

## 2017-06-04 MED ORDER — FAMOTIDINE IN NACL 20-0.9 MG/50ML-% IV SOLN
INTRAVENOUS | Status: AC
  Filled 2017-06-04: qty 50

## 2017-06-04 MED ORDER — SODIUM CHLORIDE 0.9 % IV SOLN
Freq: Once | INTRAVENOUS | Status: AC
  Administered 2017-06-04: 12:00:00 via INTRAVENOUS

## 2017-06-04 NOTE — Patient Instructions (Signed)
   [facility] Discharge Instructions for Patients Receiving Chemotherapy  Today you received the following chemotherapy agents Taxol and Carboplatin   To help prevent nausea and vomiting after your treatment, we encourage you to take your nausea medication as directed.    If you develop nausea and vomiting that is not controlled by your nausea medication, call the clinic.   BELOW ARE SYMPTOMS THAT SHOULD BE REPORTED IMMEDIATELY:  *FEVER GREATER THAN 100.5 F  *CHILLS WITH OR WITHOUT FEVER  NAUSEA AND VOMITING THAT IS NOT CONTROLLED WITH YOUR NAUSEA MEDICATION  *UNUSUAL SHORTNESS OF BREATH  *UNUSUAL BRUISING OR BLEEDING  TENDERNESS IN MOUTH AND THROAT WITH OR WITHOUT PRESENCE OF ULCERS  *URINARY PROBLEMS  *BOWEL PROBLEMS  UNUSUAL RASH Items with * indicate a potential emergency and should be followed up as soon as possible.  Feel free to call the clinic should you have any questions or concerns. The clinic phone number is [phone].  Please show the CHEMO ALERT CARD at check-in to the Emergency Department and triage nurse.   

## 2017-06-05 ENCOUNTER — Ambulatory Visit
Admission: RE | Admit: 2017-06-05 | Discharge: 2017-06-05 | Disposition: A | Discharge status: Home / Self Care | Payer: Medicare Other | Source: Ambulatory Visit | Attending: Radiation Oncology | Admitting: Radiation Oncology

## 2017-06-05 DIAGNOSIS — I252 Old myocardial infarction: Secondary | ICD-10-CM | POA: Diagnosis not present

## 2017-06-05 DIAGNOSIS — I2510 Atherosclerotic heart disease of native coronary artery without angina pectoris: Secondary | ICD-10-CM | POA: Diagnosis not present

## 2017-06-05 DIAGNOSIS — I77811 Abdominal aortic ectasia: Secondary | ICD-10-CM | POA: Diagnosis not present

## 2017-06-05 DIAGNOSIS — Z510 Encounter for antineoplastic radiation therapy: Secondary | ICD-10-CM | POA: Diagnosis not present

## 2017-06-05 DIAGNOSIS — E119 Type 2 diabetes mellitus without complications: Secondary | ICD-10-CM | POA: Diagnosis not present

## 2017-06-05 DIAGNOSIS — C3411 Malignant neoplasm of upper lobe, right bronchus or lung: Secondary | ICD-10-CM | POA: Diagnosis not present

## 2017-06-06 ENCOUNTER — Ambulatory Visit: Payer: Medicare Other | Admitting: Internal Medicine

## 2017-06-06 ENCOUNTER — Ambulatory Visit (INDEPENDENT_AMBULATORY_CARE_PROVIDER_SITE_OTHER): Payer: Medicare Other | Admitting: Internal Medicine

## 2017-06-06 ENCOUNTER — Encounter: Payer: Self-pay | Admitting: Internal Medicine

## 2017-06-06 ENCOUNTER — Ambulatory Visit
Admission: RE | Admit: 2017-06-06 | Discharge: 2017-06-06 | Disposition: A | Discharge status: Home / Self Care | Payer: Medicare Other | Source: Ambulatory Visit | Attending: Radiation Oncology | Admitting: Radiation Oncology

## 2017-06-06 VITALS — BP 128/80 | HR 92 | Ht 71.0 in | Wt 208.4 lb

## 2017-06-06 DIAGNOSIS — Z510 Encounter for antineoplastic radiation therapy: Secondary | ICD-10-CM | POA: Diagnosis not present

## 2017-06-06 DIAGNOSIS — I2510 Atherosclerotic heart disease of native coronary artery without angina pectoris: Secondary | ICD-10-CM

## 2017-06-06 DIAGNOSIS — E782 Mixed hyperlipidemia: Secondary | ICD-10-CM

## 2017-06-06 DIAGNOSIS — C3411 Malignant neoplasm of upper lobe, right bronchus or lung: Secondary | ICD-10-CM | POA: Diagnosis not present

## 2017-06-06 DIAGNOSIS — I252 Old myocardial infarction: Secondary | ICD-10-CM | POA: Diagnosis not present

## 2017-06-06 DIAGNOSIS — I119 Hypertensive heart disease without heart failure: Secondary | ICD-10-CM

## 2017-06-06 DIAGNOSIS — E119 Type 2 diabetes mellitus without complications: Secondary | ICD-10-CM | POA: Diagnosis not present

## 2017-06-06 DIAGNOSIS — I77811 Abdominal aortic ectasia: Secondary | ICD-10-CM | POA: Diagnosis not present

## 2017-06-06 MED ORDER — CLOPIDOGREL BISULFATE 75 MG PO TABS: 75.0000 mg | ORAL_TABLET | Freq: Every day | ORAL | 3 refills | Status: DC

## 2017-06-06 NOTE — Progress Notes (Signed)
Cardiology Office Note   Date:  06/06/2017   ID:  Randy [name], DOB 1945/12/26, MRN [id number]  PCP:  [name], PA-C  Cardiologist:   [name], MD   F/U of CAD    History of Present Illness: Randy [name] is a 72 y.o. male with a history of CAD    S/p PCI in 2012 in [place]  Pt went on to have an NSTEMI in May 2017   Cardiac catheterization demonstrated 90% proximal LCx stenosis treated with a Promus DES and 95% OM1 in-stent restenosis treated with POBA (angioscope scoring balloon). Echocardiogram demonstrated mild LVH, EF 55% and possible inferolateral hypokinesis, moderate diastolic dysfunction and trace AI. This PCI course was uneventful. He was discharged on aspirin, Brilinta, Lipitor 80, Avapro, Toprol.  e.  I saw him back in  May  2018  Since I saw him he has been diagnosed with Lung CA ( R lung)   He is currently undergoing daily radiation therapy  He has had chemo therapy  He contacted the office last month to say his pulse was running faster and his BP was lower   Recomm he stop his ARB   He has increased metoprolol to 1 tab BID    He denies CP   Breathing is OK   He is tired      Current Meds  Medication Sig  . aspirin EC 81 MG tablet Take 81 mg by mouth daily.  [name] atorvastatin (LIPITOR) 40 MG tablet Take 1 tablet (40 mg total) by mouth daily.  . chlorpheniramine-HYDROcodone (TUSSIONEX PENNKINETIC ER) 10-8 MG/5ML SUER Take 5 mLs by mouth every 12 (twelve) hours as needed for cough.  . clopidogrel (PLAVIX) 75 MG tablet Take 1 tablet (75 mg total) by mouth daily.  . metFORMIN (GLUCOPHAGE) 500 MG tablet Take 0.5 tablets (250 mg total) by mouth 2 (two) times daily with a meal.  . metoprolol succinate (TOPROL-XL) 25 MG 24 hr tablet Take 1 tablet (25 mg total) by mouth 2 (two) times daily.  . prochlorperazine (COMPAZINE) 10 MG tablet Take 1 tablet (10 mg total) by mouth every 6 (six) hours as needed for nausea or vomiting.  . sucralfate (CARAFATE) 1 g  tablet Take 1 g by mouth 2 (two) times daily.  . VENTOLIN HFA 108 (90 Base) MCG/ACT inhaler Inhale 1 puff every 4 (four) hours as needed into the lungs for wheezing or shortness of breath.     Allergies:   Brilinta [ticagrelor]   Past Medical History:  Diagnosis Date  . 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 Repeat US due 2023  . Carotid arterial disease ([place])    a. S/p R CEA;  b. 05/2015 Carotid U/S: RICA 8-67%, LICA 54-49%.  . Coronary artery disease    a. 2012 MI in [place] w/ PCI  //  b. 04/2013 MV: inflat scar, no ischemia.  //  c.   NSTEMI 5/17 >> Promus DES to LCx and POBA to OM1 (ISR).   . Diabetes mellitus without complication ([place])   . Grade II diastolic dysfunction 20/02/711  . History of cardiac catheterization 10/21/15   a. NSTEMI 5/17: oLAD 40, pLCx 90 (Promus DES), OM2 90 ISR (POBA), oRCA 25   . History of echocardiogram    a. Mild LVH, EF 50-55%, possible inferolateral HK, grade 2 diastolic dysfunction, trivial AI, MAC  . History of nuclear stress test 04/2013   a. Myoview 12/14: Intermediate risk, prior inferolateral MI, no ischemia,  EF 45%  . Hx of leukocytosis   . Hyperlipidemia   . Hypertension   . Hypertensive heart disease   . Morbid obesity ([place])   . Tobacco abuse     Past Surgical History:  Procedure Laterality Date  . CARDIAC CATHETERIZATION    . CARDIAC CATHETERIZATION N/A 10/21/2015   Procedure: Left Heart Cath and Coronary Angiography;  Surgeon: [name], MD;  Location: [facility];  Service: Cardiovascular;  Laterality: N/A;  . CARDIAC CATHETERIZATION N/A 10/21/2015   Procedure: Coronary Stent Intervention;  Surgeon: [name], MD;  Location: [facility];  Service: Cardiovascular;  Laterality: N/A;  . CARDIAC CATHETERIZATION N/A 10/21/2015   Procedure: Coronary Balloon Angioplasty;  Surgeon: [name], MD;  Location: [facility];  Service: Cardiovascular;  Laterality: N/A;  . CAROTID  ENDARTERECTOMY    . CORONARY ANGIOPLASTY    . CORONARY STENT PLACEMENT    . VIDEO BRONCHOSCOPY Bilateral 04/03/2017   Procedure: VIDEO BRONCHOSCOPY WITH FLUORO;  Surgeon: [name], MD;  Location: [facility];  Service: Cardiopulmonary;  Laterality: Bilateral;     Social History:  The patient  reports that he quit smoking about 19 months ago. His smoking use included cigarettes. He started smoking about 44 years ago. He has a 30.00 pack-year smoking history. he has never used smokeless tobacco. He reports that he does not drink alcohol or use drugs.   Family History:  The patient's family history includes Hypertension in his father; Other in his mother.    ROS:  Please see the history of present illness. All other systems are reviewed and  Negative to the above problem except as noted.    PHYSICAL EXAM: VS:  BP 128/80   Pulse 92   Ht _0  (1.803 m)   Wt 208 lb 6.4 oz (94.5 kg)   BMI 29.07 kg/m   GEN: Well nourished, well developed, in no acute distress  HEENT: normal  Neck: no JVD, carotid bruits, or masses Cardiac: RRR; no murmurs, rubs, or gallops,no edema  Respiratory: Rhonchi   GI: soft, nontender, nondistended, + BS  No hepatomegaly  MS: no deformity Moving all extremities   Skin: warm and dry, no rash Neuro:  Strength and sensation are intact Psych: euthymic mood, full affect   EKG:  EKG is not ordered today.   Lipid Panel    Component Value Date/Time   CHOL 116 10/20/2016 1508   TRIG 149 10/20/2016 1508   HDL 31 (L) 10/20/2016 1508   CHOLHDL 3.7 10/20/2016 1508   CHOLHDL 3.2 12/24/2015 0809   VLDL 20 12/24/2015 0809   LDLCALC 55 10/20/2016 1508      Wt Readings from Last 3 Encounters:  06/06/17 208 lb 6.4 oz (94.5 kg)  05/28/17 206 lb 4.8 oz (93.6 kg)  05/14/17 204 lb 3.2 oz (92.6 kg)      ASSESSMENT AND PLAN:  1  CAD Pt remains asymptomatic  He is on ASA and Plavix  I would continue given instent restenosis.     2  HTN  BP Is 120s to  140s  If consistently reads over 140 then I would increase metoprolol to 50 am / 25 pm  3  HL  Continue statin      Will also check CBC and BMET    F/U next winter      Current medicines are reviewed at length with the patient today.  The patient does not have concerns regarding  medicines.  Signed, [name], MD  06/06/2017 2:04 PM    [address], [place], [place]  [address] Phone: [phone]; Fax: [phone]

## 2017-06-06 NOTE — Patient Instructions (Signed)
Your physician recommends that you continue on your current medications as directed. Please refer to the Current Medication list given to you today. Your physician wants you to follow-up in: August, 2019 with Dr. Ross.  You will receive a reminder letter in the mail two months in advance. If you don't receive a letter, please call our office to schedule the follow-up appointment.  

## 2017-06-07 ENCOUNTER — Ambulatory Visit
Admission: RE | Admit: 2017-06-07 | Discharge: 2017-06-07 | Disposition: A | Discharge status: Home / Self Care | Payer: Medicare Other | Source: Ambulatory Visit | Attending: Radiation Oncology | Admitting: Radiation Oncology

## 2017-06-07 DIAGNOSIS — I252 Old myocardial infarction: Secondary | ICD-10-CM | POA: Diagnosis not present

## 2017-06-07 DIAGNOSIS — I77811 Abdominal aortic ectasia: Secondary | ICD-10-CM | POA: Diagnosis not present

## 2017-06-07 DIAGNOSIS — E119 Type 2 diabetes mellitus without complications: Secondary | ICD-10-CM | POA: Diagnosis not present

## 2017-06-07 DIAGNOSIS — Z510 Encounter for antineoplastic radiation therapy: Secondary | ICD-10-CM | POA: Diagnosis not present

## 2017-06-07 DIAGNOSIS — C3411 Malignant neoplasm of upper lobe, right bronchus or lung: Secondary | ICD-10-CM | POA: Diagnosis not present

## 2017-06-07 DIAGNOSIS — I2510 Atherosclerotic heart disease of native coronary artery without angina pectoris: Secondary | ICD-10-CM | POA: Diagnosis not present

## 2017-06-08 ENCOUNTER — Ambulatory Visit
Admission: RE | Admit: 2017-06-08 | Discharge: 2017-06-08 | Disposition: A | Discharge status: Home / Self Care | Payer: Medicare Other | Source: Ambulatory Visit | Attending: Radiation Oncology | Admitting: Radiation Oncology

## 2017-06-08 DIAGNOSIS — I77811 Abdominal aortic ectasia: Secondary | ICD-10-CM | POA: Diagnosis not present

## 2017-06-08 DIAGNOSIS — I2510 Atherosclerotic heart disease of native coronary artery without angina pectoris: Secondary | ICD-10-CM | POA: Diagnosis not present

## 2017-06-08 DIAGNOSIS — E119 Type 2 diabetes mellitus without complications: Secondary | ICD-10-CM | POA: Diagnosis not present

## 2017-06-08 DIAGNOSIS — C3411 Malignant neoplasm of upper lobe, right bronchus or lung: Secondary | ICD-10-CM | POA: Diagnosis not present

## 2017-06-08 DIAGNOSIS — I252 Old myocardial infarction: Secondary | ICD-10-CM | POA: Diagnosis not present

## 2017-06-08 DIAGNOSIS — Z510 Encounter for antineoplastic radiation therapy: Secondary | ICD-10-CM | POA: Diagnosis not present

## 2017-06-10 ENCOUNTER — Other Ambulatory Visit: Payer: Self-pay | Admitting: Internal Medicine

## 2017-06-11 ENCOUNTER — Other Ambulatory Visit: Payer: Self-pay

## 2017-06-11 ENCOUNTER — Telehealth: Payer: Self-pay | Admitting: Oncology

## 2017-06-11 ENCOUNTER — Ambulatory Visit
Admission: RE | Admit: 2017-06-11 | Discharge: 2017-06-11 | Disposition: A | Discharge status: Home / Self Care | Payer: Medicare Other | Source: Ambulatory Visit | Attending: Radiation Oncology | Admitting: Radiation Oncology

## 2017-06-11 ENCOUNTER — Encounter: Payer: Self-pay | Admitting: Oncology

## 2017-06-11 ENCOUNTER — Inpatient Hospital Stay: Payer: Medicare Other

## 2017-06-11 ENCOUNTER — Inpatient Hospital Stay (HOSPITAL_BASED_OUTPATIENT_CLINIC_OR_DEPARTMENT_OTHER): Payer: Medicare Other | Admitting: Oncology

## 2017-06-11 VITALS — BP 156/86 | HR 91 | Temp 97.5°F | Resp 19 | Ht 71.0 in | Wt 204.6 lb

## 2017-06-11 DIAGNOSIS — C3411 Malignant neoplasm of upper lobe, right bronchus or lung: Secondary | ICD-10-CM

## 2017-06-11 DIAGNOSIS — I10 Essential (primary) hypertension: Secondary | ICD-10-CM

## 2017-06-11 DIAGNOSIS — Z5111 Encounter for antineoplastic chemotherapy: Secondary | ICD-10-CM | POA: Diagnosis not present

## 2017-06-11 DIAGNOSIS — L819 Disorder of pigmentation, unspecified: Secondary | ICD-10-CM | POA: Diagnosis not present

## 2017-06-11 DIAGNOSIS — E119 Type 2 diabetes mellitus without complications: Secondary | ICD-10-CM | POA: Diagnosis not present

## 2017-06-11 DIAGNOSIS — C3491 Malignant neoplasm of unspecified part of right bronchus or lung: Secondary | ICD-10-CM

## 2017-06-11 DIAGNOSIS — I252 Old myocardial infarction: Secondary | ICD-10-CM | POA: Diagnosis not present

## 2017-06-11 DIAGNOSIS — R1310 Dysphagia, unspecified: Secondary | ICD-10-CM | POA: Diagnosis not present

## 2017-06-11 DIAGNOSIS — Z87891 Personal history of nicotine dependence: Secondary | ICD-10-CM | POA: Diagnosis not present

## 2017-06-11 DIAGNOSIS — F419 Anxiety disorder, unspecified: Secondary | ICD-10-CM | POA: Diagnosis not present

## 2017-06-11 DIAGNOSIS — Z510 Encounter for antineoplastic radiation therapy: Secondary | ICD-10-CM | POA: Diagnosis not present

## 2017-06-11 DIAGNOSIS — I77811 Abdominal aortic ectasia: Secondary | ICD-10-CM | POA: Diagnosis not present

## 2017-06-11 DIAGNOSIS — I2510 Atherosclerotic heart disease of native coronary artery without angina pectoris: Secondary | ICD-10-CM | POA: Diagnosis not present

## 2017-06-11 LAB — CBC WITH DIFFERENTIAL/PLATELET
BASOS ABS: 0.1 10*3/uL (ref 0.0–0.1)
Basophils Relative: 2 %
EOS PCT: 3 %
Eosinophils Absolute: 0.1 10*3/uL (ref 0.0–0.5)
HEMATOCRIT: 39.3 % (ref 38.4–49.9)
Hemoglobin: 12.7 g/dL — ABNORMAL LOW (ref 13.0–17.1)
LYMPHS PCT: 17 %
Lymphs Abs: 0.6 10*3/uL — ABNORMAL LOW (ref 0.9–3.3)
MCH: 27.6 pg (ref 27.2–33.4)
MCHC: 32.2 g/dL (ref 32.0–36.0)
MCV: 85.7 fL (ref 79.3–98.0)
MONOS PCT: 11 %
Monocytes Absolute: 0.4 10*3/uL (ref 0.1–0.9)
NEUTROS ABS: 2.2 10*3/uL (ref 1.5–6.5)
Neutrophils Relative %: 67 %
PLATELETS: 187 10*3/uL (ref 140–400)
RBC: 4.59 MIL/uL (ref 4.20–5.82)
RDW: 16 % — AB (ref 11.0–15.6)
WBC: 3.3 10*3/uL — ABNORMAL LOW (ref 4.0–10.3)

## 2017-06-11 LAB — COMPREHENSIVE METABOLIC PANEL
ALT: 27 U/L (ref 0–55)
ANION GAP: 8 (ref 3–11)
AST: 17 U/L (ref 5–34)
Albumin: 3.2 g/dL — ABNORMAL LOW (ref 3.5–5.0)
Alkaline Phosphatase: 74 U/L (ref 40–150)
BILIRUBIN TOTAL: 0.6 mg/dL (ref 0.2–1.2)
BUN: 9 mg/dL (ref 7–26)
CHLORIDE: 104 mmol/L (ref 98–109)
CO2: 25 mmol/L (ref 22–29)
Calcium: 9 mg/dL (ref 8.4–10.4)
Creatinine, Ser: 0.77 mg/dL (ref 0.70–1.30)
Glucose, Bld: 150 mg/dL — ABNORMAL HIGH (ref 70–140)
POTASSIUM: 4.3 mmol/L (ref 3.5–5.1)
Sodium: 137 mmol/L (ref 136–145)
TOTAL PROTEIN: 6.8 g/dL (ref 6.4–8.3)

## 2017-06-11 MED ORDER — DIPHENHYDRAMINE HCL 50 MG/ML IJ SOLN
50.0000 mg | Freq: Once | INTRAMUSCULAR | Status: AC
  Administered 2017-06-11: 50 mg via INTRAVENOUS

## 2017-06-11 MED ORDER — CARBOPLATIN CHEMO INJECTION 450 MG/45ML
230.4000 mg | Freq: Once | INTRAVENOUS | Status: AC
  Administered 2017-06-11: 230 mg via INTRAVENOUS
  Filled 2017-06-11: qty 23

## 2017-06-11 MED ORDER — FAMOTIDINE IN NACL 20-0.9 MG/50ML-% IV SOLN
20.0000 mg | Freq: Once | INTRAVENOUS | Status: AC
  Administered 2017-06-11: 20 mg via INTRAVENOUS

## 2017-06-11 MED ORDER — SODIUM CHLORIDE 0.9 % IV SOLN
Freq: Once | INTRAVENOUS | Status: AC
  Administered 2017-06-11: 13:00:00 via INTRAVENOUS

## 2017-06-11 MED ORDER — SODIUM CHLORIDE 0.9 % IV SOLN
20.0000 mg | Freq: Once | INTRAVENOUS | Status: AC
  Administered 2017-06-11: 20 mg via INTRAVENOUS
  Filled 2017-06-11: qty 2

## 2017-06-11 MED ORDER — SODIUM CHLORIDE 0.9 % IV SOLN
45.0000 mg/m2 | Freq: Once | INTRAVENOUS | Status: AC
  Administered 2017-06-11: 96 mg via INTRAVENOUS
  Filled 2017-06-11: qty 16

## 2017-06-11 MED ORDER — PALONOSETRON HCL INJECTION 0.25 MG/5ML
0.2500 mg | Freq: Once | INTRAVENOUS | Status: AC
  Administered 2017-06-11: 0.25 mg via INTRAVENOUS

## 2017-06-11 NOTE — Patient Instructions (Signed)
[facility] Discharge Instructions for Patients Receiving Chemotherapy  Today you received the following chemotherapy agents:  Taxol, Carboplatin  To help prevent nausea and vomiting after your treatment, we encourage you to take your nausea medication as prescribed.   If you develop nausea and vomiting that is not controlled by your nausea medication, call the clinic.   BELOW ARE SYMPTOMS THAT SHOULD BE REPORTED IMMEDIATELY:  *FEVER GREATER THAN 100.5 F  *CHILLS WITH OR WITHOUT FEVER  NAUSEA AND VOMITING THAT IS NOT CONTROLLED WITH YOUR NAUSEA MEDICATION  *UNUSUAL SHORTNESS OF BREATH  *UNUSUAL BRUISING OR BLEEDING  TENDERNESS IN MOUTH AND THROAT WITH OR WITHOUT PRESENCE OF ULCERS  *URINARY PROBLEMS  *BOWEL PROBLEMS  UNUSUAL RASH Items with * indicate a potential emergency and should be followed up as soon as possible.  Feel free to call the clinic should you have any questions or concerns. The clinic phone number is [phone].  Please show the CHEMO ALERT CARD at check-in to the Emergency Department and triage nurse.   

## 2017-06-11 NOTE — Progress Notes (Signed)
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  PRIOR THERAPY: None.  CURRENT THERAPY: A course of concurrent chemoradiation with weekly carboplatin for AC of 2 and paclitaxel 45 mg/M2.  Status post 4 cycles.  INTERVAL HISTORY: Randy [name] 72 y.o. male returns for routine follow-up visit accompanied by his wife.  The patient is feeling fine today has no specific complaints except for mild odynophagia.  He is taking Carafate with improvement in his symptoms.  He reports a good appetite and no weight loss.  He denies fevers and chills.  Denies chest pain, shortness breath, cough, hemoptysis.  Denies nausea, vomiting, constipation, diarrhea.  The patient is here for evaluation prior to cycle 5 of his chemotherapy.  MEDICAL HISTORY: Past Medical History:  Diagnosis Date  . 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 Repeat US due 2023  . Carotid arterial disease ([place])    a. S/p R CEA;  b. 05/2015 Carotid U/S: RICA 2-77%, LICA 41-28%.  . Coronary artery disease    a. 2012 MI in [place] w/ PCI  //  b. 04/2013 MV: inflat scar, no ischemia.  //  c.   NSTEMI 5/17 >> Promus DES to LCx and POBA to OM1 (ISR).   . Diabetes mellitus without complication ([place])   . Grade II diastolic dysfunction 78/67/6720  . History of cardiac catheterization 10/21/15   a. NSTEMI 5/17: oLAD 40, pLCx 90 (Promus DES), OM2 90 ISR (POBA), oRCA 25   . History of echocardiogram    a. Mild LVH, EF 50-55%, possible inferolateral HK, grade 2 diastolic dysfunction, trivial AI, MAC  . History of nuclear stress test 04/2013   a. Myoview 12/14:  Intermediate risk, prior inferolateral MI, no ischemia, EF 45%  . Hx of leukocytosis   . Hyperlipidemia   . Hypertension   . Hypertensive heart disease   . Morbid obesity ([place])   . Tobacco abuse     ALLERGIES:  is allergic to brilinta [ticagrelor].  MEDICATIONS:  Current Outpatient Medications  Medication Sig Dispense Refill  . aspirin EC 81 MG tablet Take 81 mg by mouth daily.    [name] atorvastatin (LIPITOR) 40 MG tablet Take 1 tablet (40 mg total) by mouth daily. 90 tablet 3  . chlorpheniramine-HYDROcodone (TUSSIONEX PENNKINETIC ER) 10-8 MG/5ML SUER Take 5 mLs by mouth every 12 (twelve) hours as needed for cough. 140 mL 0  . clopidogrel (PLAVIX) 75 MG tablet Take 1 tablet (75 mg total) by mouth daily. 90 tablet 3  . metFORMIN (GLUCOPHAGE) 500 MG tablet Take 0.5 tablets (250 mg total) by mouth 2 (two) times daily with a meal. 90 tablet 0  . metoprolol succinate (TOPROL-XL) 25 MG 24 hr tablet Take 1 tablet (25 mg total) by mouth 2 (two) times daily. 180 tablet 3  . sucralfate (CARAFATE) 1 g tablet Take 1 g by mouth 2 (two) times daily.    . VENTOLIN HFA 108 (90 Base) MCG/ACT inhaler Inhale 1 puff every 4 (four) hours as needed into the lungs for wheezing or shortness of breath. 1 Inhaler 5  . prochlorperazine (COMPAZINE) 10 MG tablet Take 1 tablet (10 mg total) by mouth every 6 (six) hours  as needed for nausea or vomiting. (Patient not taking: Reported on 06/11/2017) 30 tablet 2   No current facility-administered medications for this visit.     SURGICAL HISTORY:  Past Surgical History:  Procedure Laterality Date  . CARDIAC CATHETERIZATION    . CARDIAC CATHETERIZATION N/A 10/21/2015   Procedure: Left Heart Cath and Coronary Angiography;  Surgeon: [name], MD;  Location: [place] [facility];  Service: Cardiovascular;  Laterality: N/A;  . CARDIAC CATHETERIZATION N/A 10/21/2015   Procedure: Coronary Stent Intervention;  Surgeon: [name], MD;  Location: [place] [facility];  Service: Cardiovascular;  Laterality: N/A;  . CARDIAC CATHETERIZATION N/A 10/21/2015   Procedure: Coronary Balloon Angioplasty;  Surgeon: [name], MD;  Location: [place] [facility];  Service: Cardiovascular;  Laterality: N/A;  . CAROTID ENDARTERECTOMY    . CORONARY ANGIOPLASTY    . CORONARY STENT PLACEMENT    . VIDEO BRONCHOSCOPY Bilateral 04/03/2017   Procedure: VIDEO BRONCHOSCOPY WITH FLUORO;  Surgeon: [name], MD;  Location: [facility];  Service: Cardiopulmonary;  Laterality: Bilateral;    REVIEW OF SYSTEMS:   Review of Systems  Constitutional: Negative for appetite change, chills, fatigue, fever and unexpected weight change.  HENT:   Negative for mouth sores, nosebleeds, sore throat and trouble swallowing.   Eyes: Negative for eye problems and icterus.  Respiratory: Negative for cough, hemoptysis, shortness of breath and wheezing.   Cardiovascular: Negative for chest pain and leg swelling.  Gastrointestinal: Negative for abdominal pain, constipation, diarrhea, nausea and vomiting.  Genitourinary: Negative for bladder incontinence, difficulty urinating, dysuria, frequency and hematuria.   Musculoskeletal: Negative for back pain, gait problem, neck pain and neck stiffness.  Skin: Negative for itching and rash.  Neurological: Negative for dizziness, extremity weakness, gait problem, headaches, light-headedness and seizures.  Hematological: Negative for adenopathy. Does not bruise/bleed easily.  Psychiatric/Behavioral: Negative for confusion, depression and sleep disturbance. The patient is not nervous/anxious.     PHYSICAL EXAMINATION:  Blood pressure (!) 156/86, pulse 91, temperature (!) 97.5 F (36.4 C), temperature source Oral, resp. rate 19, height 5' 11"  (1.803 m), weight 204 lb 9.6 oz (92.8 kg), SpO2 98 %.  ECOG PERFORMANCE STATUS: 1 - Symptomatic but completely ambulatory  Physical Exam  Constitutional: Oriented to person, place, and time and  well-developed, well-nourished, and in no distress. No distress.  HENT:  Head: Normocephalic and atraumatic.  Mouth/Throat: Oropharynx is clear and moist. No oropharyngeal exudate.  Eyes: Conjunctivae are normal. Right eye exhibits no discharge. Left eye exhibits no discharge. No scleral icterus.  Neck: Normal range of motion. Neck supple.  Cardiovascular: Normal rate, regular rhythm, normal heart sounds and intact distal pulses.   Pulmonary/Chest: Effort normal and breath sounds normal. No respiratory distress. No wheezes. No rales.  Abdominal: Soft. Bowel sounds are normal. Exhibits no distension and no mass. There is no tenderness.  Musculoskeletal: Normal range of motion. Exhibits no edema.  Lymphadenopathy:    No cervical adenopathy.  Neurological: Alert and oriented to person, place, and time. Exhibits normal muscle tone. Gait normal. Coordination normal.  Skin: Skin is warm and dry. No rash noted. Not diaphoretic. No erythema. No pallor.  Psychiatric: Mood, memory and judgment normal.  Vitals reviewed.  LABORATORY DATA: Lab Results  Component Value Date   WBC 3.3 (L) 06/11/2017   HGB 12.7 (L) 06/11/2017   HCT 39.3 06/11/2017   MCV 85.7 06/11/2017   PLT 187 06/11/2017      Chemistry  Component Value Date/Time   NA 137 06/11/2017 0923   NA 133 (L) 05/28/2017 0932   K 4.3 06/11/2017 0923   K 4.4 05/28/2017 0932   CL 104 06/11/2017 0923   CO2 25 06/11/2017 0923   CO2 24 05/28/2017 0932   BUN 9 06/11/2017 0923   BUN 9.3 05/28/2017 0932   CREATININE 0.77 06/11/2017 0923   CREATININE 0.8 05/28/2017 0932      Component Value Date/Time   CALCIUM 9.0 06/11/2017 0923   CALCIUM 8.5 05/28/2017 0932   ALKPHOS 74 06/11/2017 0923   ALKPHOS 68 05/28/2017 0932   AST 17 06/11/2017 0923   AST 13 05/28/2017 0932   ALT 27 06/11/2017 0923   ALT 16 05/28/2017 0932   BILITOT 0.6 06/11/2017 0923   BILITOT 0.40 05/28/2017 0932       RADIOGRAPHIC STUDIES:  No results  found.   ASSESSMENT/PLAN:  Malignant neoplasm of bronchus of right upper lobe [facility]) This is a very pleasant 72 year old white male with a stage IIIb non-small cell lung cancer, squamous cell carcinoma. The patient is currently undergoing a course of concurrent chemoradiation with weekly carboplatin and paclitaxel.   He tolerated the last 4 weeks of his treatment fairly well with no significant adverse effects. I recommended for the patient to proceed with cycle #5 today as a scheduled. Follow-up visit will be in 2 weeks for evaluation before starting cycle #7.  For hypertension, I recommend that he continue his home blood pressure medications.  Patient checks his blood pressure at home and it reads lower than it does in our office.  He thinks his elevated blood pressure is related to anxiety of coming to the cancer center.  He was advised to call immediately if he has any concerning symptoms in the interval. The patient voices understanding of current disease status and treatment options and is in agreement with the current care plan. All questions were answered. The patient knows to call the clinic with any problems, questions or concerns. We can certainly see the patient much sooner if necessary.  No orders of the defined types were placed in this encounter.   Mikey Bussing, DNP, AGPCNP-BC, AOCNP 06/11/17

## 2017-06-11 NOTE — Telephone Encounter (Signed)
Appts already scheduled per 1/14  Los - no additional appts

## 2017-06-11 NOTE — Assessment & Plan Note (Signed)
This is a very pleasant 72 year old white male with a stage IIIb non-small cell lung cancer, squamous cell carcinoma. The patient is currently undergoing a course of concurrent chemoradiation with weekly carboplatin and paclitaxel.   He tolerated the last 4 weeks of his treatment fairly well with no significant adverse effects. I recommended for the patient to proceed with cycle #5 today as a scheduled. Follow-up visit will be in 2 weeks for evaluation before starting cycle #7.  For hypertension, I recommend that he continue his home blood pressure medications.  Patient checks his blood pressure at home and it reads lower than it does in our office.  He thinks his elevated blood pressure is related to anxiety of coming to the cancer center.  He was advised to call immediately if he has any concerning symptoms in the interval. The patient voices understanding of current disease status and treatment options and is in agreement with the current care plan. All questions were answered. The patient knows to call the clinic with any problems, questions or concerns. We can certainly see the patient much sooner if necessary.

## 2017-06-12 ENCOUNTER — Ambulatory Visit
Admission: RE | Admit: 2017-06-12 | Discharge: 2017-06-12 | Disposition: A | Discharge status: Home / Self Care | Payer: Medicare Other | Source: Ambulatory Visit | Attending: Radiation Oncology | Admitting: Radiation Oncology

## 2017-06-12 DIAGNOSIS — E119 Type 2 diabetes mellitus without complications: Secondary | ICD-10-CM | POA: Diagnosis not present

## 2017-06-12 DIAGNOSIS — C3411 Malignant neoplasm of upper lobe, right bronchus or lung: Secondary | ICD-10-CM | POA: Diagnosis not present

## 2017-06-12 DIAGNOSIS — I2510 Atherosclerotic heart disease of native coronary artery without angina pectoris: Secondary | ICD-10-CM | POA: Diagnosis not present

## 2017-06-12 DIAGNOSIS — Z510 Encounter for antineoplastic radiation therapy: Secondary | ICD-10-CM | POA: Diagnosis not present

## 2017-06-12 DIAGNOSIS — I77811 Abdominal aortic ectasia: Secondary | ICD-10-CM | POA: Diagnosis not present

## 2017-06-12 DIAGNOSIS — I252 Old myocardial infarction: Secondary | ICD-10-CM | POA: Diagnosis not present

## 2017-06-13 ENCOUNTER — Ambulatory Visit
Admission: RE | Admit: 2017-06-13 | Discharge: 2017-06-13 | Disposition: A | Discharge status: Home / Self Care | Payer: Medicare Other | Source: Ambulatory Visit | Attending: Radiation Oncology | Admitting: Radiation Oncology

## 2017-06-13 DIAGNOSIS — I77811 Abdominal aortic ectasia: Secondary | ICD-10-CM | POA: Diagnosis not present

## 2017-06-13 DIAGNOSIS — Z510 Encounter for antineoplastic radiation therapy: Secondary | ICD-10-CM | POA: Diagnosis not present

## 2017-06-13 DIAGNOSIS — E119 Type 2 diabetes mellitus without complications: Secondary | ICD-10-CM | POA: Diagnosis not present

## 2017-06-13 DIAGNOSIS — C3411 Malignant neoplasm of upper lobe, right bronchus or lung: Secondary | ICD-10-CM | POA: Diagnosis not present

## 2017-06-13 DIAGNOSIS — I252 Old myocardial infarction: Secondary | ICD-10-CM | POA: Diagnosis not present

## 2017-06-13 DIAGNOSIS — I2510 Atherosclerotic heart disease of native coronary artery without angina pectoris: Secondary | ICD-10-CM | POA: Diagnosis not present

## 2017-06-14 ENCOUNTER — Ambulatory Visit
Admission: RE | Admit: 2017-06-14 | Discharge: 2017-06-14 | Disposition: A | Discharge status: Home / Self Care | Payer: Medicare Other | Source: Ambulatory Visit | Attending: Radiation Oncology | Admitting: Radiation Oncology

## 2017-06-14 DIAGNOSIS — E119 Type 2 diabetes mellitus without complications: Secondary | ICD-10-CM | POA: Diagnosis not present

## 2017-06-14 DIAGNOSIS — I2510 Atherosclerotic heart disease of native coronary artery without angina pectoris: Secondary | ICD-10-CM | POA: Diagnosis not present

## 2017-06-14 DIAGNOSIS — I252 Old myocardial infarction: Secondary | ICD-10-CM | POA: Diagnosis not present

## 2017-06-14 DIAGNOSIS — Z510 Encounter for antineoplastic radiation therapy: Secondary | ICD-10-CM | POA: Diagnosis not present

## 2017-06-14 DIAGNOSIS — I77811 Abdominal aortic ectasia: Secondary | ICD-10-CM | POA: Diagnosis not present

## 2017-06-14 DIAGNOSIS — C3411 Malignant neoplasm of upper lobe, right bronchus or lung: Secondary | ICD-10-CM | POA: Diagnosis not present

## 2017-06-15 ENCOUNTER — Ambulatory Visit
Admission: RE | Admit: 2017-06-15 | Discharge: 2017-06-15 | Disposition: A | Discharge status: Home / Self Care | Payer: Medicare Other | Source: Ambulatory Visit | Attending: Radiation Oncology | Admitting: Radiation Oncology

## 2017-06-15 DIAGNOSIS — I252 Old myocardial infarction: Secondary | ICD-10-CM | POA: Diagnosis not present

## 2017-06-15 DIAGNOSIS — E119 Type 2 diabetes mellitus without complications: Secondary | ICD-10-CM | POA: Diagnosis not present

## 2017-06-15 DIAGNOSIS — Z510 Encounter for antineoplastic radiation therapy: Secondary | ICD-10-CM | POA: Diagnosis not present

## 2017-06-15 DIAGNOSIS — I77811 Abdominal aortic ectasia: Secondary | ICD-10-CM | POA: Diagnosis not present

## 2017-06-15 DIAGNOSIS — C3411 Malignant neoplasm of upper lobe, right bronchus or lung: Secondary | ICD-10-CM | POA: Diagnosis not present

## 2017-06-15 DIAGNOSIS — I2510 Atherosclerotic heart disease of native coronary artery without angina pectoris: Secondary | ICD-10-CM | POA: Diagnosis not present

## 2017-06-18 ENCOUNTER — Inpatient Hospital Stay: Payer: Medicare Other

## 2017-06-18 ENCOUNTER — Ambulatory Visit
Admission: RE | Admit: 2017-06-18 | Discharge: 2017-06-18 | Disposition: A | Discharge status: Home / Self Care | Payer: Medicare Other | Source: Ambulatory Visit | Attending: Radiation Oncology | Admitting: Radiation Oncology

## 2017-06-18 VITALS — BP 148/88 | HR 86 | Temp 97.9°F | Resp 16

## 2017-06-18 DIAGNOSIS — Z510 Encounter for antineoplastic radiation therapy: Secondary | ICD-10-CM | POA: Diagnosis not present

## 2017-06-18 DIAGNOSIS — I252 Old myocardial infarction: Secondary | ICD-10-CM | POA: Diagnosis not present

## 2017-06-18 DIAGNOSIS — C3411 Malignant neoplasm of upper lobe, right bronchus or lung: Secondary | ICD-10-CM | POA: Diagnosis not present

## 2017-06-18 DIAGNOSIS — Z87891 Personal history of nicotine dependence: Secondary | ICD-10-CM | POA: Diagnosis not present

## 2017-06-18 DIAGNOSIS — L819 Disorder of pigmentation, unspecified: Secondary | ICD-10-CM | POA: Diagnosis not present

## 2017-06-18 DIAGNOSIS — I77811 Abdominal aortic ectasia: Secondary | ICD-10-CM | POA: Diagnosis not present

## 2017-06-18 DIAGNOSIS — I10 Essential (primary) hypertension: Secondary | ICD-10-CM | POA: Diagnosis not present

## 2017-06-18 DIAGNOSIS — F419 Anxiety disorder, unspecified: Secondary | ICD-10-CM | POA: Diagnosis not present

## 2017-06-18 DIAGNOSIS — C3491 Malignant neoplasm of unspecified part of right bronchus or lung: Secondary | ICD-10-CM

## 2017-06-18 DIAGNOSIS — Z5111 Encounter for antineoplastic chemotherapy: Secondary | ICD-10-CM | POA: Diagnosis not present

## 2017-06-18 DIAGNOSIS — E119 Type 2 diabetes mellitus without complications: Secondary | ICD-10-CM | POA: Diagnosis not present

## 2017-06-18 DIAGNOSIS — I2510 Atherosclerotic heart disease of native coronary artery without angina pectoris: Secondary | ICD-10-CM | POA: Diagnosis not present

## 2017-06-18 LAB — COMPREHENSIVE METABOLIC PANEL
ALBUMIN: 3.3 g/dL — AB (ref 3.5–5.0)
ALK PHOS: 78 U/L (ref 40–150)
ALT: 25 U/L (ref 0–55)
AST: 18 U/L (ref 5–34)
Anion gap: 11 (ref 3–11)
BUN: 11 mg/dL (ref 7–26)
CO2: 26 mmol/L (ref 22–29)
Calcium: 8.8 mg/dL (ref 8.4–10.4)
Chloride: 99 mmol/L (ref 98–109)
Creatinine, Ser: 0.81 mg/dL (ref 0.70–1.30)
GFR calc Af Amer: >60 mL/min (ref >60)
GFR calc non Af Amer: >60 mL/min (ref >60)
Glucose, Bld: 126 mg/dL (ref 70–140)
Potassium: 4.7 mmol/L (ref 3.5–5.1)
SODIUM: 136 mmol/L (ref 136–145)
TOTAL PROTEIN: 7.4 g/dL (ref 6.4–8.3)
Total Bilirubin: 0.9 mg/dL (ref 0.2–1.2)

## 2017-06-18 LAB — CBC WITH DIFFERENTIAL/PLATELET
BASOS ABS: 0.1 10*3/uL (ref 0.0–0.1)
Basophils Relative: 3 %
EOS ABS: 0.1 10*3/uL (ref 0.0–0.5)
EOS PCT: 2 %
HCT: 41 % (ref 38.4–49.9)
HEMOGLOBIN: 13.2 g/dL (ref 13.0–17.1)
LYMPHS ABS: 0.5 10*3/uL — AB (ref 0.9–3.3)
LYMPHS PCT: 17 %
MCH: 27.9 pg (ref 27.2–33.4)
MCHC: 32.1 g/dL (ref 32.0–36.0)
MCV: 86.8 fL (ref 79.3–98.0)
Monocytes Absolute: 0.4 10*3/uL (ref 0.1–0.9)
Monocytes Relative: 13 %
NEUTROS PCT: 65 %
Neutro Abs: 2 10*3/uL (ref 1.5–6.5)
PLATELETS: 202 10*3/uL (ref 140–400)
RBC: 4.73 MIL/uL (ref 4.20–5.82)
RDW: 18.3 % — ABNORMAL HIGH (ref 11.0–15.6)
WBC: 3 10*3/uL — ABNORMAL LOW (ref 4.0–10.3)

## 2017-06-18 MED ORDER — PACLITAXEL CHEMO INJECTION 300 MG/50ML
45.0000 mg/m2 | Freq: Once | INTRAVENOUS | Status: AC
  Administered 2017-06-18: 96 mg via INTRAVENOUS
  Filled 2017-06-18: qty 16

## 2017-06-18 MED ORDER — SODIUM CHLORIDE 0.9 % IV SOLN
230.4000 mg | Freq: Once | INTRAVENOUS | Status: AC
  Administered 2017-06-18: 230 mg via INTRAVENOUS
  Filled 2017-06-18: qty 23

## 2017-06-18 MED ORDER — DIPHENHYDRAMINE HCL 50 MG/ML IJ SOLN
50.0000 mg | Freq: Once | INTRAMUSCULAR | Status: AC
  Administered 2017-06-18: 50 mg via INTRAVENOUS

## 2017-06-18 MED ORDER — PALONOSETRON HCL INJECTION 0.25 MG/5ML
0.2500 mg | Freq: Once | INTRAVENOUS | Status: AC
  Administered 2017-06-18: 0.25 mg via INTRAVENOUS

## 2017-06-18 MED ORDER — SODIUM CHLORIDE 0.9 % IV SOLN
20.0000 mg | Freq: Once | INTRAVENOUS | Status: AC
  Administered 2017-06-18: 20 mg via INTRAVENOUS
  Filled 2017-06-18: qty 2

## 2017-06-18 MED ORDER — SODIUM CHLORIDE 0.9 % IV SOLN
Freq: Once | INTRAVENOUS | Status: AC
  Administered 2017-06-18: 14:00:00 via INTRAVENOUS

## 2017-06-18 MED ORDER — FAMOTIDINE IN NACL 20-0.9 MG/50ML-% IV SOLN
20.0000 mg | Freq: Once | INTRAVENOUS | Status: AC
  Administered 2017-06-18: 20 mg via INTRAVENOUS

## 2017-06-18 NOTE — Patient Instructions (Signed)
[facility] Discharge Instructions for Patients Receiving Chemotherapy  Today you received the following chemotherapy agents: Carboplatin and Taxol  To help prevent nausea and vomiting after your treatment, we encourage you to take your nausea medication as directed   If you develop nausea and vomiting that is not controlled by your nausea medication, call the clinic.   BELOW ARE SYMPTOMS THAT SHOULD BE REPORTED IMMEDIATELY:  *FEVER GREATER THAN 100.5 F  *CHILLS WITH OR WITHOUT FEVER  NAUSEA AND VOMITING THAT IS NOT CONTROLLED WITH YOUR NAUSEA MEDICATION  *UNUSUAL SHORTNESS OF BREATH  *UNUSUAL BRUISING OR BLEEDING  TENDERNESS IN MOUTH AND THROAT WITH OR WITHOUT PRESENCE OF ULCERS  *URINARY PROBLEMS  *BOWEL PROBLEMS  UNUSUAL RASH Items with * indicate a potential emergency and should be followed up as soon as possible.  Feel free to call the clinic should you have any questions or concerns. The clinic phone number is [phone].  Please show the CHEMO ALERT CARD at check-in to the Emergency Department and triage nurse.   

## 2017-06-19 ENCOUNTER — Ambulatory Visit
Admission: RE | Admit: 2017-06-19 | Discharge: 2017-06-19 | Disposition: A | Discharge status: Home / Self Care | Payer: Medicare Other | Source: Ambulatory Visit | Attending: Radiation Oncology | Admitting: Radiation Oncology

## 2017-06-19 DIAGNOSIS — Z510 Encounter for antineoplastic radiation therapy: Secondary | ICD-10-CM | POA: Diagnosis not present

## 2017-06-19 DIAGNOSIS — I252 Old myocardial infarction: Secondary | ICD-10-CM | POA: Diagnosis not present

## 2017-06-19 DIAGNOSIS — I77811 Abdominal aortic ectasia: Secondary | ICD-10-CM | POA: Diagnosis not present

## 2017-06-19 DIAGNOSIS — C3411 Malignant neoplasm of upper lobe, right bronchus or lung: Secondary | ICD-10-CM | POA: Diagnosis not present

## 2017-06-19 DIAGNOSIS — I2510 Atherosclerotic heart disease of native coronary artery without angina pectoris: Secondary | ICD-10-CM | POA: Diagnosis not present

## 2017-06-19 DIAGNOSIS — E119 Type 2 diabetes mellitus without complications: Secondary | ICD-10-CM | POA: Diagnosis not present

## 2017-06-20 ENCOUNTER — Ambulatory Visit: Payer: Medicare Other

## 2017-06-20 ENCOUNTER — Ambulatory Visit
Admission: RE | Admit: 2017-06-20 | Discharge: 2017-06-20 | Disposition: A | Discharge status: Home / Self Care | Payer: Medicare Other | Source: Ambulatory Visit | Attending: Radiation Oncology | Admitting: Radiation Oncology

## 2017-06-20 DIAGNOSIS — Z510 Encounter for antineoplastic radiation therapy: Secondary | ICD-10-CM | POA: Diagnosis not present

## 2017-06-20 DIAGNOSIS — E119 Type 2 diabetes mellitus without complications: Secondary | ICD-10-CM | POA: Diagnosis not present

## 2017-06-20 DIAGNOSIS — C3411 Malignant neoplasm of upper lobe, right bronchus or lung: Secondary | ICD-10-CM | POA: Diagnosis not present

## 2017-06-20 DIAGNOSIS — I252 Old myocardial infarction: Secondary | ICD-10-CM | POA: Diagnosis not present

## 2017-06-20 DIAGNOSIS — I77811 Abdominal aortic ectasia: Secondary | ICD-10-CM | POA: Diagnosis not present

## 2017-06-20 DIAGNOSIS — I2510 Atherosclerotic heart disease of native coronary artery without angina pectoris: Secondary | ICD-10-CM | POA: Diagnosis not present

## 2017-06-21 ENCOUNTER — Ambulatory Visit: Payer: Medicare Other

## 2017-06-21 ENCOUNTER — Ambulatory Visit
Admission: RE | Admit: 2017-06-21 | Discharge: 2017-06-21 | Disposition: A | Discharge status: Home / Self Care | Payer: Medicare Other | Source: Ambulatory Visit | Attending: Radiation Oncology | Admitting: Radiation Oncology

## 2017-06-21 DIAGNOSIS — Z510 Encounter for antineoplastic radiation therapy: Secondary | ICD-10-CM | POA: Diagnosis not present

## 2017-06-21 DIAGNOSIS — E119 Type 2 diabetes mellitus without complications: Secondary | ICD-10-CM | POA: Diagnosis not present

## 2017-06-21 DIAGNOSIS — C3411 Malignant neoplasm of upper lobe, right bronchus or lung: Secondary | ICD-10-CM | POA: Diagnosis not present

## 2017-06-21 DIAGNOSIS — I252 Old myocardial infarction: Secondary | ICD-10-CM | POA: Diagnosis not present

## 2017-06-21 DIAGNOSIS — I2510 Atherosclerotic heart disease of native coronary artery without angina pectoris: Secondary | ICD-10-CM | POA: Diagnosis not present

## 2017-06-21 DIAGNOSIS — I77811 Abdominal aortic ectasia: Secondary | ICD-10-CM | POA: Diagnosis not present

## 2017-06-22 ENCOUNTER — Encounter: Payer: Self-pay | Admitting: Radiation Oncology

## 2017-06-22 ENCOUNTER — Ambulatory Visit
Admission: RE | Admit: 2017-06-22 | Discharge: 2017-06-22 | Disposition: A | Discharge status: Home / Self Care | Payer: Medicare Other | Source: Ambulatory Visit | Attending: Radiation Oncology | Admitting: Radiation Oncology

## 2017-06-22 ENCOUNTER — Ambulatory Visit: Payer: Medicare Other

## 2017-06-22 DIAGNOSIS — Z510 Encounter for antineoplastic radiation therapy: Secondary | ICD-10-CM | POA: Diagnosis not present

## 2017-06-22 DIAGNOSIS — C3411 Malignant neoplasm of upper lobe, right bronchus or lung: Secondary | ICD-10-CM | POA: Diagnosis not present

## 2017-06-22 DIAGNOSIS — I77811 Abdominal aortic ectasia: Secondary | ICD-10-CM | POA: Diagnosis not present

## 2017-06-22 DIAGNOSIS — I252 Old myocardial infarction: Secondary | ICD-10-CM | POA: Diagnosis not present

## 2017-06-22 DIAGNOSIS — E119 Type 2 diabetes mellitus without complications: Secondary | ICD-10-CM | POA: Diagnosis not present

## 2017-06-22 DIAGNOSIS — I2510 Atherosclerotic heart disease of native coronary artery without angina pectoris: Secondary | ICD-10-CM | POA: Diagnosis not present

## 2017-06-25 ENCOUNTER — Encounter: Payer: Self-pay | Admitting: Oncology

## 2017-06-25 ENCOUNTER — Ambulatory Visit: Admission: RE | Admit: 2017-06-25 | Payer: Medicare Other | Source: Ambulatory Visit

## 2017-06-25 ENCOUNTER — Inpatient Hospital Stay: Payer: Medicare Other

## 2017-06-25 ENCOUNTER — Inpatient Hospital Stay (HOSPITAL_BASED_OUTPATIENT_CLINIC_OR_DEPARTMENT_OTHER): Payer: Medicare Other | Admitting: Oncology

## 2017-06-25 ENCOUNTER — Telehealth: Payer: Self-pay | Admitting: Oncology

## 2017-06-25 ENCOUNTER — Ambulatory Visit: Payer: Medicare Other

## 2017-06-25 VITALS — BP 144/72 | HR 102 | Temp 97.8°F | Resp 18 | Ht 71.0 in | Wt 205.4 lb

## 2017-06-25 DIAGNOSIS — I10 Essential (primary) hypertension: Secondary | ICD-10-CM

## 2017-06-25 DIAGNOSIS — Z5111 Encounter for antineoplastic chemotherapy: Secondary | ICD-10-CM

## 2017-06-25 DIAGNOSIS — C3491 Malignant neoplasm of unspecified part of right bronchus or lung: Secondary | ICD-10-CM

## 2017-06-25 DIAGNOSIS — Z87891 Personal history of nicotine dependence: Secondary | ICD-10-CM | POA: Diagnosis not present

## 2017-06-25 DIAGNOSIS — L819 Disorder of pigmentation, unspecified: Secondary | ICD-10-CM | POA: Diagnosis not present

## 2017-06-25 DIAGNOSIS — C3411 Malignant neoplasm of upper lobe, right bronchus or lung: Secondary | ICD-10-CM | POA: Diagnosis not present

## 2017-06-25 DIAGNOSIS — F419 Anxiety disorder, unspecified: Secondary | ICD-10-CM | POA: Diagnosis not present

## 2017-06-25 LAB — CBC WITH DIFFERENTIAL/PLATELET
BASOS ABS: 0 10*3/uL (ref 0.0–0.1)
BASOS PCT: 1 %
EOS ABS: 0.1 10*3/uL (ref 0.0–0.5)
EOS PCT: 2 %
HCT: 37.9 % — ABNORMAL LOW (ref 38.4–49.9)
Hemoglobin: 12.7 g/dL — ABNORMAL LOW (ref 13.0–17.1)
Lymphocytes Relative: 17 %
Lymphs Abs: 0.4 10*3/uL — ABNORMAL LOW (ref 0.9–3.3)
MCH: 28.9 pg (ref 27.2–33.4)
MCHC: 33.5 g/dL (ref 32.0–36.0)
MCV: 86.1 fL (ref 79.3–98.0)
Monocytes Absolute: 0.4 10*3/uL (ref 0.1–0.9)
Monocytes Relative: 15 %
Neutro Abs: 1.6 10*3/uL (ref 1.5–6.5)
Neutrophils Relative %: 65 %
PLATELETS: 182 10*3/uL (ref 140–400)
RBC: 4.4 MIL/uL (ref 4.20–5.82)
RDW: 17.8 % — ABNORMAL HIGH (ref 11.0–15.6)
WBC: 2.5 10*3/uL — AB (ref 4.0–10.3)

## 2017-06-25 LAB — COMPREHENSIVE METABOLIC PANEL
ALK PHOS: 70 U/L (ref 40–150)
ALT: 22 U/L (ref 0–55)
AST: 17 U/L (ref 5–34)
Albumin: 3.1 g/dL — ABNORMAL LOW (ref 3.5–5.0)
Anion gap: 8 (ref 3–11)
BUN: 6 mg/dL — AB (ref 7–26)
CO2: 25 mmol/L (ref 22–29)
CREATININE: 0.8 mg/dL (ref 0.70–1.30)
Calcium: 9.1 mg/dL (ref 8.4–10.4)
Chloride: 101 mmol/L (ref 98–109)
Glucose, Bld: 138 mg/dL (ref 70–140)
Potassium: 4.6 mmol/L (ref 3.5–5.1)
Sodium: 134 mmol/L — ABNORMAL LOW (ref 136–145)
Total Bilirubin: 0.9 mg/dL (ref 0.2–1.2)
Total Protein: 6.8 g/dL (ref 6.4–8.3)

## 2017-06-25 NOTE — Telephone Encounter (Signed)
Scheduled appt per 1/28 los - Gave patient AVS and calender per los.  

## 2017-06-25 NOTE — Assessment & Plan Note (Addendum)
This is a very pleasant 72 year old white male with a stage IIIb non-small cell lung cancer, squamous cell carcinoma. The patient is currently undergoing a course of concurrent chemoradiation with weekly carboplatin and paclitaxel. He tolerated the last 6 weeks of his treatment fairly well with no significant adverse effects. The patient completed radiation this past Friday.  He has now completed his chemotherapy and we will not proceed with the scheduled dose today.  The patient will have a restaging CT scan of the chest and approximate 3-4 weeks.  He will follow-up visit 1-2 days after the scan to discuss the results.  For the hyperpigmentation on his back, I have recommended that he continue to use the cream that was given to him by radiation oncology.  For hypertension, I recommend that he continue his home blood pressure medications.    He was advised to call immediately if he has any concerning symptoms in the interval. The patient voices understanding of current disease status and treatment options and is in agreement with the current care plan. All questions were answered. The patient knows to call the clinic with any problems, questions or concerns. We can certainly see the patient much sooner if necessary.

## 2017-06-25 NOTE — Progress Notes (Signed)
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  PRIOR THERAPY: None.  CURRENT THERAPY: A course of concurrent chemoradiation with weekly carboplatin for AUC of 2 and paclitaxel 45 mg/M2.Status post 6 cycles.  INTERVAL HISTORY: Randy [name] 72 y.o. male returns for routine follow-up visit accompanied by his wife.  Patient is feeling fine today has no specific complaints except for skin discoloration on his back.  He completed his radiation this past Friday, 06/22/2017.  The patient denies fevers and chills.  Denies chest pain, shortness of breath, cough, hemoptysis.  Denies odynophagia.  Denies nausea, vomiting, constipation, diarrhea.  Denies weight loss.  The patient is here for evaluation prior to cycle #7 of his chemotherapy.  MEDICAL HISTORY: Past Medical History:  Diagnosis Date  . 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 Repeat US due 2023  . Carotid arterial disease ([place])    a. S/p R CEA;  b. 05/2015 Carotid U/S: RICA 6-04%, LICA 54-09%.  . Coronary artery disease    a. 2012 MI in [place] w/ PCI  //  b. 04/2013 MV: inflat scar, no ischemia.  //  c.   NSTEMI 5/17 >> Promus DES to LCx and POBA to OM1 (ISR).   . Diabetes mellitus without complication ([place])   . Grade II diastolic dysfunction 81/19/1478  . History of cardiac catheterization 10/21/15   a. NSTEMI 5/17: oLAD 40, pLCx 90 (Promus DES), OM2 90 ISR (POBA), oRCA 25   . History of echocardiogram    a. Mild LVH, EF 50-55%, possible inferolateral HK, grade 2 diastolic dysfunction, trivial AI, MAC  . History of nuclear stress test 04/2013   a.  Myoview 12/14: Intermediate risk, prior inferolateral MI, no ischemia, EF 45%  . Hx of leukocytosis   . Hyperlipidemia   . Hypertension   . Hypertensive heart disease   . Morbid obesity ([place])   . Tobacco abuse     ALLERGIES:  is allergic to brilinta [ticagrelor].  MEDICATIONS:  Current Outpatient Medications  Medication Sig Dispense Refill  . aspirin EC 81 MG tablet Take 81 mg by mouth daily.    [name] atorvastatin (LIPITOR) 40 MG tablet TAKE 1 TABLET BY MOUTH EVERY DAY 90 tablet 3  . chlorpheniramine-HYDROcodone (TUSSIONEX PENNKINETIC ER) 10-8 MG/5ML SUER Take 5 mLs by mouth every 12 (twelve) hours as needed for cough. 140 mL 0  . clopidogrel (PLAVIX) 75 MG tablet Take 1 tablet (75 mg total) by mouth daily. 90 tablet 3  . metFORMIN (GLUCOPHAGE) 500 MG tablet Take 0.5 tablets (250 mg total) by mouth 2 (two) times daily with a meal. 90 tablet 0  . metoprolol succinate (TOPROL-XL) 25 MG 24 hr tablet Take 1 tablet (25 mg total) by mouth 2 (two) times daily. 180 tablet 3  . prochlorperazine (COMPAZINE) 10 MG tablet Take 1 tablet (10 mg total) by mouth every 6 (six) hours as needed for nausea or vomiting. (Patient not taking: Reported on 06/11/2017) 30 tablet 2  . sucralfate (CARAFATE) 1 g tablet Take 1 g by mouth 2 (two) times daily.    . VENTOLIN HFA 108 (90 Base) MCG/ACT inhaler Inhale 1 puff every 4 (four) hours as needed into  the lungs for wheezing or shortness of breath. 1 Inhaler 5   No current facility-administered medications for this visit.     SURGICAL HISTORY:  Past Surgical History:  Procedure Laterality Date  . CARDIAC CATHETERIZATION    . CARDIAC CATHETERIZATION N/A 10/21/2015   Procedure: Left Heart Cath and Coronary Angiography;  Surgeon: [name], MD;  Location: [place] [facility];  Service: Cardiovascular;  Laterality: N/A;  . CARDIAC CATHETERIZATION N/A 10/21/2015   Procedure: Coronary Stent Intervention;  Surgeon: [name], MD;  Location: [place] [facility];  Service: Cardiovascular;  Laterality: N/A;  . CARDIAC CATHETERIZATION N/A 10/21/2015   Procedure: Coronary Balloon Angioplasty;  Surgeon: [name], MD;  Location: [place] [facility];  Service: Cardiovascular;  Laterality: N/A;  . CAROTID ENDARTERECTOMY    . CORONARY ANGIOPLASTY    . CORONARY STENT PLACEMENT    . VIDEO BRONCHOSCOPY Bilateral 04/03/2017   Procedure: VIDEO BRONCHOSCOPY WITH FLUORO;  Surgeon: [name], MD;  Location: [facility];  Service: Cardiopulmonary;  Laterality: Bilateral;    REVIEW OF SYSTEMS:   Review of Systems  Constitutional: Negative for appetite change, chills, fatigue, fever and unexpected weight change.  HENT:   Negative for mouth sores, nosebleeds, sore throat and trouble swallowing.   Eyes: Negative for eye problems and icterus.  Respiratory: Negative for cough, hemoptysis, shortness of breath and wheezing.   Cardiovascular: Negative for chest pain and leg swelling.  Gastrointestinal: Negative for abdominal pain, constipation, diarrhea, nausea and vomiting.  Genitourinary: Negative for bladder incontinence, difficulty urinating, dysuria, frequency and hematuria.   Musculoskeletal: Negative for back pain, gait problem, neck pain and neck stiffness.  Skin: Negative for itching and rash. Positive for discoloration on his back. Neurological: Negative for dizziness, extremity weakness, gait problem, headaches, light-headedness and seizures.  Hematological: Negative for adenopathy. Does not bruise/bleed easily.  Psychiatric/Behavioral: Negative for confusion, depression and sleep disturbance. The patient is not nervous/anxious.     PHYSICAL EXAMINATION:  Blood pressure (!) 144/72, pulse (!) 102, temperature 97.8 F (36.6 C), temperature source Oral, resp. rate 18, height 5' 11"  (1.803 m), weight 205 lb 6.4 oz (93.2 kg), SpO2 98 %.  ECOG PERFORMANCE STATUS: 1 - Symptomatic but completely ambulatory  Physical Exam  Constitutional:  Oriented to person, place, and time and well-developed, well-nourished, and in no distress. No distress.  HENT:  Head: Normocephalic and atraumatic.  Mouth/Throat: Oropharynx is clear and moist. No oropharyngeal exudate.  Eyes: Conjunctivae are normal. Right eye exhibits no discharge. Left eye exhibits no discharge. No scleral icterus.  Neck: Normal range of motion. Neck supple.  Cardiovascular: Normal rate, regular rhythm, normal heart sounds and intact distal pulses.   Pulmonary/Chest: Effort normal and breath sounds normal. No respiratory distress. No wheezes. No rales.  Abdominal: Soft. Bowel sounds are normal. Exhibits no distension and no mass. There is no tenderness.  Musculoskeletal: Normal range of motion. Exhibits no edema.  Lymphadenopathy:    No cervical adenopathy.  Neurological: Alert and oriented to person, place, and time. Exhibits normal muscle tone. Gait normal. Coordination normal.  Skin: Skin is warm and dry. No rash noted. Not diaphoretic. No erythema. No pallor. Hyperpigmentation noted to the right back.  Psychiatric: Mood, memory and judgment normal.  Vitals reviewed.  LABORATORY DATA: Lab Results  Component Value Date   WBC 2.5 (L) 06/25/2017   HGB 12.7 (L) 06/25/2017   HCT 37.9 (L) 06/25/2017   MCV 86.1 06/25/2017   PLT 182 06/25/2017  Chemistry      Component Value Date/Time   NA 134 (L) 06/25/2017 1011   NA 133 (L) 05/28/2017 0932   K 4.6 06/25/2017 1011   K 4.4 05/28/2017 0932   CL 101 06/25/2017 1011   CO2 25 06/25/2017 1011   CO2 24 05/28/2017 0932   BUN 6 (L) 06/25/2017 1011   BUN 9.3 05/28/2017 0932   CREATININE 0.80 06/25/2017 1011   CREATININE 0.8 05/28/2017 0932      Component Value Date/Time   CALCIUM 9.1 06/25/2017 1011   CALCIUM 8.5 05/28/2017 0932   ALKPHOS 70 06/25/2017 1011   ALKPHOS 68 05/28/2017 0932   AST 17 06/25/2017 1011   AST 13 05/28/2017 0932   ALT 22 06/25/2017 1011   ALT 16 05/28/2017 0932   BILITOT 0.9  06/25/2017 1011   BILITOT 0.40 05/28/2017 0932       RADIOGRAPHIC STUDIES:  No results found.   ASSESSMENT/PLAN:  Malignant neoplasm of bronchus of right upper lobe [facility]) This is a very pleasant 72 year old white male with a stage IIIb non-small cell lung cancer, squamous cell carcinoma. The patient is currently undergoing a course of concurrent chemoradiation with weekly carboplatin and paclitaxel. He tolerated the last 6 weeks of his treatment fairly well with no significant adverse effects. The patient completed radiation this past Friday.  He has now completed his chemotherapy and we will not proceed with the scheduled dose today.  The patient will have a restaging CT scan of the chest and approximate 3-4 weeks.  He will follow-up visit 1-2 days after the scan to discuss the results.  For the hyperpigmentation on his back, I have recommended that he continue to use the cream that was given to him by radiation oncology.  For hypertension, I recommend that he continue his home blood pressure medications.    He was advised to call immediately if he has any concerning symptoms in the interval. The patient voices understanding of current disease status and treatment options and is in agreement with the current care plan. All questions were answered. The patient knows to call the clinic with any problems, questions or concerns. We can certainly see the patient much sooner if necessary.  Orders Placed This Encounter  Procedures  . CT CHEST W CONTRAST    Standing Status:   Future    Standing Expiration Date:   06/25/2018    Order Specific Question:   If indicated for the ordered procedure, I authorize the administration of contrast media per Radiology protocol    Answer:   Yes    Order Specific Question:   Preferred imaging location?    Answer:   [facility]    Order Specific Question:   Radiology Contrast Protocol - do NOT remove file path    Answer:    [email or web address]    Order Specific Question:   Reason for Exam additional comments    Answer:   lung cancer. Restaging.     Mikey Bussing, DNP, AGPCNP-BC, AOCNP 06/25/17

## 2017-06-29 ENCOUNTER — Encounter: Payer: Self-pay | Admitting: Oncology

## 2017-07-02 ENCOUNTER — Telehealth: Payer: Self-pay | Admitting: Medical Oncology

## 2017-07-02 NOTE — Telephone Encounter (Signed)
Monitor for now

## 2017-07-02 NOTE — Telephone Encounter (Signed)
Pt.notified

## 2017-07-02 NOTE — Telephone Encounter (Signed)
L shoulder cramp/tightness x 1 week. Using heat and massage which helps but pain returns. He states he had this cramping prior to tx but not as bad as it is now.  HX of falling on this  shoulder a year ago. . Denies pain with deep inspiration.

## 2017-07-04 ENCOUNTER — Encounter: Payer: Self-pay | Admitting: Physician Assistant

## 2017-07-05 ENCOUNTER — Telehealth: Payer: Self-pay | Admitting: Medical Oncology

## 2017-07-05 ENCOUNTER — Ambulatory Visit (INDEPENDENT_AMBULATORY_CARE_PROVIDER_SITE_OTHER): Payer: Medicare Other

## 2017-07-05 ENCOUNTER — Ambulatory Visit (INDEPENDENT_AMBULATORY_CARE_PROVIDER_SITE_OTHER): Payer: Medicare Other | Admitting: Family Medicine

## 2017-07-05 ENCOUNTER — Encounter: Payer: Self-pay | Admitting: Family Medicine

## 2017-07-05 VITALS — BP 132/84 | HR 92 | Wt 203.0 lb

## 2017-07-05 DIAGNOSIS — M62838 Other muscle spasm: Secondary | ICD-10-CM

## 2017-07-05 DIAGNOSIS — I2510 Atherosclerotic heart disease of native coronary artery without angina pectoris: Secondary | ICD-10-CM

## 2017-07-05 DIAGNOSIS — M542 Cervicalgia: Secondary | ICD-10-CM | POA: Diagnosis not present

## 2017-07-05 DIAGNOSIS — M4802 Spinal stenosis, cervical region: Secondary | ICD-10-CM

## 2017-07-05 MED ORDER — CYCLOBENZAPRINE HCL 5 MG PO TABS
5.0000 mg | ORAL_TABLET | Freq: Two times a day (BID) | ORAL | 1 refills | Status: DC | PRN
Start: 2017-07-05 — End: 2017-09-26

## 2017-07-05 NOTE — Telephone Encounter (Signed)
Shoulder problem- sees ortho today the patient asking if xray today will interfere with getting ct scan later. I told him no.

## 2017-07-05 NOTE — Progress Notes (Signed)
Randy [name] is a 72 y.o. male who presents to [facility] today for left trapezius pain.  Randy [name] has a 1-1/2-week history of worsening pain in the left posterior trapezius.  He notes occasional pain radiating to the left forearm.  He notes the pain is typically worse with shoulder motion.  He denies any pain in the left lateral upper arm.  He notes neck motion also tends to worsen his pain.  He denies any recent injury but notes a year ago he fell landing on his left shoulder.  He had some shoulder pain off and on since.Randy [name] His health is also significant for a squamous cell lung cancer.  He recently completed chemotherapy and radiation and has a follow-up CT scan later this month.   Past Medical History:  Diagnosis Date  . 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 Repeat US due 2023  . Carotid arterial disease (Randy [name])    a. S/p R CEA;  b. 05/2015 Carotid U/S: RICA 3-90%, LICA 30-09%.  . Coronary artery disease    a. 2012 MI in [place] w/ PCI  //  b. 04/2013 MV: inflat scar, no ischemia.  //  c.   NSTEMI 5/17 >> Promus DES to LCx and POBA to OM1 (ISR).   . Diabetes mellitus without complication (Randy [name])   . Grade II diastolic dysfunction 23/30/0762  . History of cardiac catheterization 10/21/15   a. NSTEMI 5/17: oLAD 40, pLCx 90 (Promus DES), OM2 90 ISR (POBA), oRCA 25   . History of echocardiogram    a. Mild LVH, EF 50-55%, possible inferolateral HK, grade 2 diastolic dysfunction, trivial AI, MAC  . History of nuclear stress test 04/2013   a. Myoview 12/14: Intermediate risk, prior inferolateral MI, no ischemia, EF 45%  . Hx of leukocytosis   . Hyperlipidemia   . Hypertension   . Hypertensive heart disease   . Morbid obesity (Randy [name])   . Tobacco abuse    Past Surgical History:  Procedure Laterality Date  . CARDIAC CATHETERIZATION    . CARDIAC CATHETERIZATION N/A 10/21/2015   Procedure: Left Heart Cath and Coronary Angiography;  Surgeon:  [name], MD;  Location: [facility];  Service: Cardiovascular;  Laterality: N/A;  . CARDIAC CATHETERIZATION N/A 10/21/2015   Procedure: Coronary Stent Intervention;  Surgeon: [name], MD;  Location: [facility];  Service: Cardiovascular;  Laterality: N/A;  . CARDIAC CATHETERIZATION N/A 10/21/2015   Procedure: Coronary Balloon Angioplasty;  Surgeon: [name], MD;  Location: [facility];  Service: Cardiovascular;  Laterality: N/A;  . CAROTID ENDARTERECTOMY    . CORONARY ANGIOPLASTY    . CORONARY STENT PLACEMENT    . VIDEO BRONCHOSCOPY Bilateral 04/03/2017   Procedure: VIDEO BRONCHOSCOPY WITH FLUORO;  Surgeon: [name], MD;  Location: [facility];  Service: Cardiopulmonary;  Laterality: Bilateral;   Social History   Tobacco Use  . Smoking status: Former Smoker    Packs/day: 0.75    Years: 40.00    Pack years: 30.00    Types: Cigarettes    Start date: 06/18/1972    Last attempt to quit: 10/24/2015    Years since quitting: 1.6  . Smokeless tobacco: Never Used  Substance Use Topics  . Alcohol use: No    Alcohol/week: 0.0 oz     ROS:  As above   Medications: Current Outpatient Medications  Medication Sig Dispense Refill  . aspirin EC 81 MG tablet  Take 81 mg by mouth daily.    Randy [name] atorvastatin (LIPITOR) 40 MG tablet TAKE 1 TABLET BY MOUTH EVERY DAY 90 tablet 3  . clopidogrel (PLAVIX) 75 MG tablet Take 1 tablet (75 mg total) by mouth daily. 90 tablet 3  . metFORMIN (GLUCOPHAGE) 500 MG tablet Take 0.5 tablets (250 mg total) by mouth 2 (two) times daily with a meal. 90 tablet 0  . metoprolol succinate (TOPROL-XL) 25 MG 24 hr tablet Take 1 tablet (25 mg total) by mouth 2 (two) times daily. 180 tablet 3  . prochlorperazine (COMPAZINE) 10 MG tablet Take 1 tablet (10 mg total) by mouth every 6 (six) hours as needed for nausea or vomiting. 30 tablet 2  . sucralfate (CARAFATE) 1 g tablet Take 1 g by mouth 2 (two) times daily.    .  VENTOLIN HFA 108 (90 Base) MCG/ACT inhaler Inhale 1 puff every 4 (four) hours as needed into the lungs for wheezing or shortness of breath. 1 Inhaler 5  . cyclobenzaprine (FLEXERIL) 5 MG tablet Take 1-2 tablets (5-10 mg total) by mouth 3 times/day as needed-between meals & bedtime for muscle spasms. 30 tablet 1   No current facility-administered medications for this visit.    Allergies  Allergen Reactions  . Brilinta [Ticagrelor] Shortness Of Breath     Exam:  BP 132/84   Pulse 92   Wt 203 lb (92.1 kg)   BMI 28.31 kg/m  General: Well Developed, well nourished, and in no acute distress.  Neuro/Psych: Alert and oriented x3, extra-ocular muscles intact, able to move all 4 extremities, sensation grossly intact. Skin: Warm and dry, no rashes noted.  Respiratory: Not using accessory muscles, speaking in full sentences, trachea midline.  Cardiovascular: Pulses palpable, no extremity edema. Abdomen: Does not appear distended. MSK:  C-spine: Nontender to midline. Neck is held in a right lateral flexed position with decreased neck motion due to pain Upper extremity strength is equal and normal throughout. Reflexes are equal normal throughout. Sensation is intact throughout.  Left trapezius is tender to palpation.  Left shoulder motion is intact without pain  X-ray C-spine pending  No results found for this or any previous visit (from the past 48 hour(s)). No results found.    Assessment and Plan: 72 y.o. male with left trapezius pain likely due to trapezius muscle spasm.  Cervical spine x-ray pending given history of cancer and fall a year ago.  Plan to treat with physical therapy heating pad small amounts of cyclobenzaprine and Tylenol.  Check in 4 weeks.  Return sooner if needed.    Orders Placed This Encounter  Procedures  . DG Cervical Spine Complete    Standing Status:   Future    Number of Occurrences:   1    Standing Expiration Date:   09/03/2018    Order Specific  Question:   Reason for Exam (SYMPTOM  OR DIAGNOSIS REQUIRED)    Answer:   eval left cspine pain. [place] 1 year ago    Order Specific Question:   Preferred imaging location?    Answer:   [facility]    Order Specific Question:   Radiology Contrast Protocol - do NOT remove file path    Answer:   [email or web address]  . Ambulatory referral to Physical Therapy    Referral Priority:   Routine    Referral Type:   Physical Medicine    Referral Reason:   Specialty Services Required    Requested Specialty:   Physical Therapy  Meds ordered this encounter  Medications  . cyclobenzaprine (FLEXERIL) 5 MG tablet    Sig: Take 1-2 tablets (5-10 mg total) by mouth 3 times/day as needed-between meals & bedtime for muscle spasms.    Dispense:  30 tablet    Refill:  1    Discussed warning signs or symptoms. Please see discharge instructions. Patient expresses understanding.

## 2017-07-05 NOTE — Patient Instructions (Addendum)
Thank you for coming in today. Attend PT.  Use a heating pad.  Use a muscle relaxer at bedtime as needed.  Use ibuprofen or tylenol for pain as needed.   Recheck in 4 weeks.,  Come back or go to the emergency room if you notice new weakness new numbness problems walking or bowel or bladder problems.

## 2017-07-09 NOTE — Progress Notes (Signed)
  Radiation Oncology         [phone] ________________________________  Name: Randy [name] MRN: [id number]  Date: 06/22/2017  DOB: Jul 09, 1945  End of Treatment Note  Diagnosis:  72 y.o. male with Stage IIIA/B T4, N1-2, M0 NSCLC, squamous cell carcinoma of the right upper lobe    Indication for treatment::  curative       Radiation treatment dates:   05/08/2017 - 06/22/2017  Site/dose:   The patient was treated to the disease within the right lung initially to a dose of 60 Gy using a 3 field, 3-D conformal technique. The patient then received a cone down boost treatment for an additional 6 Gy. This yielded a final total dose of 66 Gy.   Narrative: The patient tolerated radiation treatment relatively well.   The patient did experience esophagitis during the course of treatment. He reported some dysphasia and a productive cough with thin clear sputum. He was given a prescription for Carafate but did not use it.  Plan: The patient has completed radiation treatment. The patient will return to radiation oncology clinic for routine followup in one month. I advised the patient to call or return sooner if they have any questions or concerns related to their recovery or treatment. ________________________________  Randy Gross, MD, PhD  This document serves as a record of services personally performed by Randy Rudd, MD. It was created on his behalf by Rae Lips, a trained medical scribe. The creation of this record is based on the scribe's personal observations and the provider's statements to them. This document has been checked and approved by the attending provider.

## 2017-07-10 ENCOUNTER — Encounter: Payer: Self-pay | Admitting: Rehabilitative and Restorative Service Providers"

## 2017-07-10 ENCOUNTER — Ambulatory Visit (INDEPENDENT_AMBULATORY_CARE_PROVIDER_SITE_OTHER): Payer: Medicare Other | Admitting: Rehabilitative and Restorative Service Providers"

## 2017-07-10 DIAGNOSIS — R29898 Other symptoms and signs involving the musculoskeletal system: Secondary | ICD-10-CM

## 2017-07-10 DIAGNOSIS — M542 Cervicalgia: Secondary | ICD-10-CM | POA: Diagnosis not present

## 2017-07-10 DIAGNOSIS — R293 Abnormal posture: Secondary | ICD-10-CM | POA: Diagnosis not present

## 2017-07-10 DIAGNOSIS — M25512 Pain in left shoulder: Secondary | ICD-10-CM

## 2017-07-10 NOTE — Therapy (Signed)
[place], [place], [address] Phone: [phone]   Fax:  [phone]  Physical Therapy Evaluation  Patient Details  Name: Randy [name] MRN: [id number] Date of Birth: July 09, 1945 Referring Provider: Dr Georgina Snell    Encounter Date: 07/10/2017  PT End of Session - 07/10/17 1221    Visit Number  1    Number of Visits  12    Date for PT Re-Evaluation  08/21/17    PT Start Time  0931    PT Stop Time  1027    PT Time Calculation (min)  56 min    Activity Tolerance  Patient tolerated treatment well       Past Medical History:  Diagnosis Date  . 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 Repeat US due 2023  . Carotid arterial disease ([place])    a. S/p R CEA;  b. 05/2015 Carotid U/S: RICA 1-93%, LICA 79-02%.  . Coronary artery disease    a. 2012 MI in [place] w/ PCI  //  b. 04/2013 MV: inflat scar, no ischemia.  //  c.   NSTEMI 5/17 >> Promus DES to LCx and POBA to OM1 (ISR).   . Diabetes mellitus without complication ([place])   . Grade II diastolic dysfunction 40/97/3532  . History of cardiac catheterization 10/21/15   a. NSTEMI 5/17: oLAD 40, pLCx 90 (Promus DES), OM2 90 ISR (POBA), oRCA 25   . History of echocardiogram    a. Mild LVH, EF 50-55%, possible inferolateral HK, grade 2 diastolic dysfunction, trivial AI, MAC  . History of nuclear stress test 04/2013   a. Myoview 12/14: Intermediate risk, prior inferolateral MI, no ischemia, EF 45%  . Hx of leukocytosis   . Hyperlipidemia   . Hypertension   . Hypertensive heart disease   . Morbid obesity ([place])   . Tobacco abuse     Past Surgical History:  Procedure Laterality Date  . CARDIAC CATHETERIZATION    . CARDIAC CATHETERIZATION N/A 10/21/2015   Procedure: Left Heart Cath and Coronary Angiography;  Surgeon: [name], MD;  Location: [facility];  Service: Cardiovascular;  Laterality: N/A;  . CARDIAC CATHETERIZATION N/A  10/21/2015   Procedure: Coronary Stent Intervention;  Surgeon: [name], MD;  Location: [facility];  Service: Cardiovascular;  Laterality: N/A;  . CARDIAC CATHETERIZATION N/A 10/21/2015   Procedure: Coronary Balloon Angioplasty;  Surgeon: [name], MD;  Location: [facility];  Service: Cardiovascular;  Laterality: N/A;  . CAROTID ENDARTERECTOMY    . CORONARY ANGIOPLASTY    . CORONARY STENT PLACEMENT    . VIDEO BRONCHOSCOPY Bilateral 04/03/2017   Procedure: VIDEO BRONCHOSCOPY WITH FLUORO;  Surgeon: [name], MD;  [facility];  Service: Cardiopulmonary;  Laterality: Bilateral;    There were no vitals filed for this visit.   Subjective Assessment - 07/10/17 0939    Subjective  Don reports that he fell onto the Lt shoulder when bowling about a year ago. he has had some on and off pain in the shoulder since then with symptoms increasing in the past few weeks. he has also been lying on hard tables for treatment and tests which did not help the shoulder pain.  He has had a constant pain in the Lt shoulder for the past two weeks.     Pertinent History  Rt lung cancer; arthritis; HTN; previous problems with Lt shoulder on an intermittent basis every  couple of months with symptoms resolving on their own.     How long can you sit comfortably?  dull ache    How long can you stand comfortably?  dull ache     How long can you walk comfortably?  dull ache - worse with walking     Diagnostic tests  xrays - DDD - cervical narrowing C5/6     Patient Stated Goals  get rid of the pain in the Lt shoulder     Currently in Pain?  Yes    Pain Score  4  up to 6-7/10 with moving around    Pain Location  Shoulder    Pain Orientation  Left    Pain Descriptors / Indicators  Dull;Aching    Pain Type  Acute pain;Chronic pain    Pain Radiating Towards  shoulder blade area     Pain Onset  More than a month ago    Pain Frequency  Constant    Aggravating Factors    moving; using Lt UE; walkingawakens in the middle of the night with pain some nights     Pain Relieving Factors  heating pad         [facility] PT Assessment - 07/10/17 0001      Assessment   Medical Diagnosis  Lt trapezius pain     Referring Provider  Dr Georgina Snell     Onset Date/Surgical Date  06/22/17 injury 8/17     Hand Dominance  Right    Next MD Visit  PRN     Prior Therapy  no       Precautions   Precaution Comments  cancer Rt lung       Balance Screen   Has the patient fallen in the past 6 months  No    Has the patient had a decrease in activity level because of a fear of falling?   No    Is the patient reluctant to leave their home because of a fear of falling?   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     Leisure  things around the house; sedentary - TV and reading       Observation/Other Assessments   Focus on Therapeutic Outcomes (FOTO)   41% limitation       Sensation   Additional Comments  WFL's       AROM   Right Shoulder Extension  69 Degrees    Right Shoulder Flexion  128 Degrees    Right Shoulder ABduction  150 Degrees    Right Shoulder Internal Rotation  24 Degrees    Right Shoulder External Rotation  90 Degrees    Left Shoulder Extension  65 Degrees    Left Shoulder Flexion  134 Degrees    Left Shoulder ABduction  146 Degrees    Left Shoulder Internal Rotation  21 Degrees    Left Shoulder External Rotation  88 Degrees    Cervical Flexion  72    Cervical Extension  18    Cervical - Right Side Bend  21    Cervical - Left Side Bend  10    Cervical - Right Rotation  61    Cervical - Left Rotation  50      Strength   Overall Strength Comments  grossly 5-/5 to  5/5 bilat UE; pain with resistive testing Rt ER       Palpation   Spinal mobility  hypomobile thoracic and cervical spine     Palpation comment  significant tightness Rt >  Lt ant/lat/post cervical musculature; pecs, upper traps; leveator             Objective measurements completed on examination: See above findings.      [facility] Adult PT Treatment/Exercise - 07/10/17 0001      Self-Care   Self-Care  -- instruction in myofacial ball release work       Neuro Re-ed    Neuro Re-ed Details   working on posture and alignment       Shoulder Exercises: Stretch   Other Shoulder Stretches  axial extension chin tuck - 10 sec x 5     Other Shoulder Stretches  scap squeeze 10 sec x 10; L's x 10' W's x 10 with swim noodle  - no pain       Moist Heat Therapy   Number Minutes Moist Heat  12 Minutes    Moist Heat Location  Shoulder Lt shoulder girdle       Manual Therapy   Soft tissue mobilization  dep tissue work Lt shoulder girdle area at upper trap/leveator              PT Education - 07/10/17 1009    Education provided  Yes    Education Details  HEP myofacial ball release     Person(s) Educated  Patient    Methods  Explanation;Demonstration;Tactile cues;Verbal cues;Handout    Comprehension  Verbalized understanding;Returned demonstration;Verbal cues required;Tactile cues required          PT Long Term Goals - 07/10/17 1233      PT LONG TERM GOAL #1   Title  Improve cervical and thoracic posture and alignment with patient to demonstrate engagement of posterior shoulder girdle musculature 08/21/17    Time  6    Period  Weeks    Status  New      PT LONG TERM GOAL #2   Title  Increase cervical lateral flexion and rotation to Lt by 5-8 degrees 08/21/17    Time  6    Period  Weeks    Status  New      PT LONG TERM GOAL #3   Title  Decrease pain Lt posterior shoulder girdle strength by 50% allowing patient to use Lt UE for functional activities with no more than 2-3/10 pain 08/21/17    Time  6    Period  Weeks    Status  New      PT LONG TERM GOAL #4   Title  Independent in HEP 08/21/17    Time  6    Period  Weeks    Status  New       PT LONG TERM GOAL #5   Title  Improve FOTO to </= 31% limitation 08/21/17    Time  6    Period  Weeks    Status  New             Plan - 07/10/17 1227    Clinical Impression Statement  [name] presents with increased pain Lt shoulder in the past few weeks. he has had pain in the Lt shoulder since he fell while bowling ~ 8/17. He presents today with constant pain in the Lt shoudler girdle; poor posture and alignment; limited cervical, and thoracic spine as well as Lt  shoulder compared to Rt; muscular tightness through the Lt shoulder girdle and cervical musculature; decreased functional activity level due to Lt shoulder girdle pain.  Patient will benefit from PT to address problems identified.     Clinical Presentation  Stable    Clinical Decision Making  Low    Rehab Potential  Good    Clinical Impairments Affecting Rehab Potential  * no modalities due to Rt lobe lung cancer     PT Frequency  2x / week    PT Duration  6 weeks    PT Treatment/Interventions  Patient/family education;ADLs/Self Care Home Management;Cryotherapy;Iontophoresis 53m/ml Dexamethasone;Moist Heat;Dry needling;Manual techniques;Neuromuscular re-education;Therapeutic activities;Therapeutic exercise    PT Next Visit Plan  review HEP; manual therapy cervical and Lt shoulder girdle musculature; progress with pec stretching as tolerated; posterior shoulder girdle strengthening; moist heat    Consulted and Agree with Plan of Care  Patient       Patient will benefit from skilled therapeutic intervention in order to improve the following deficits and impairments:  Impaired UE functional use, Postural dysfunction, Improper body mechanics, Pain, Increased fascial restricitons, Increased muscle spasms, Hypomobility, Decreased mobility, Decreased range of motion, Decreased activity tolerance  Visit Diagnosis: Acute pain of left shoulder - Plan: PT plan of care cert/re-cert  Cervicalgia - Plan: PT plan of care  cert/re-cert  Abnormal posture - Plan: PT plan of care cert/re-cert  Other symptoms and signs involving the musculoskeletal system - Plan: PT plan of care cert/re-cert     Problem List Patient Active Problem List   Diagnosis Date Noted  . Encounter for antineoplastic chemotherapy 06/11/2017  . Malignant neoplasm of bronchus of right upper lobe ([place] 04/24/2017  . Abdominal aortic ectasia ([facility] 03/23/2017  . Community acquired pneumonia of right upper lobe of lung ([facility] 03/20/2017  . Mass of upper lobe of right lung 03/14/2017  . Aortic atherosclerosis ([facility] [place] 03/14/2017  . Chronic obstructive pulmonary disease ([place] 03/13/2017  . Encounter for screening for lung cancer 03/13/2017  . Grade II diastolic dysfunction 147/15/9539 . Hypertension goal BP (blood pressure) < 140/90 03/12/2017  . Cough present for greater than 3 weeks 03/12/2017  . Diabetes mellitus without complication ([place] 167/28/9791 . Former heavy cigarette smoker (20-39 per day) 03/12/2017  . NSTEMI (non-ST elevated myocardial infarction) ([place] 10/20/2015  . Carotid arterial disease ([facility]   . Hyperlipidemia   . Hypertensive heart disease   . Coronary artery disease   . Leukocytosis   . Hypertensive heart disease without heart failure     Madex Seals PNilda SimmerPT, MPH  07/10/2017, 12:39 PM  [facility] [address]Phone: [phone]  Fax:  [phone] Name: DFLEMON KELTYMRN: [id number] of Birth: 330-Jun-1947

## 2017-07-10 NOTE — Patient Instructions (Signed)
Self massage using about a 4 inch plastic ball   Axial Extension (Chin Tuck)    Pull chin in and lengthen back of neck. Hold __5__ seconds while counting out loud. Repeat __10__ times. Do __several__ sessions per day.  Shoulder Blade Squeeze    Rotate shoulders back, then squeeze shoulder blades down and back. Hold 10 sec Repeat __10__ times. Do _several___ sessions per day.  Upper Back Strength: Lower Trapezius / Rotator Cuff " L's "     Arms in waitress pose, palms up. Press hands back and slide shoulder blades down. Hold for __5__ seconds. Repeat _10___ times. 1-2 times per day.    Scapular Retraction: Elbow Flexion (Standing)  "W's"     With elbows bent to 90, pinch shoulder blades together and rotate arms out, keeping elbows bent. Repeat __10__ times per set. Do __1-2__ sets per session. Do _several ___ sessions per day.   [facility] [place], [place] [address]  [phone] (office) [phone] (fax)

## 2017-07-13 ENCOUNTER — Encounter: Payer: Medicare Other | Admitting: Physical Therapy

## 2017-07-13 ENCOUNTER — Other Ambulatory Visit: Payer: Self-pay | Admitting: Oncology

## 2017-07-13 DIAGNOSIS — C3411 Malignant neoplasm of upper lobe, right bronchus or lung: Secondary | ICD-10-CM

## 2017-07-16 ENCOUNTER — Encounter (HOSPITAL_COMMUNITY): Payer: Self-pay

## 2017-07-16 ENCOUNTER — Other Ambulatory Visit: Payer: Medicare Other

## 2017-07-16 ENCOUNTER — Inpatient Hospital Stay: Payer: Medicare Other | Attending: Internal Medicine

## 2017-07-16 ENCOUNTER — Ambulatory Visit (HOSPITAL_COMMUNITY)
Admission: RE | Admit: 2017-07-16 | Discharge: 2017-07-16 | Disposition: A | Discharge status: Home / Self Care | Payer: Medicare Other | Source: Ambulatory Visit | Attending: Oncology | Admitting: Oncology

## 2017-07-16 DIAGNOSIS — I700 Atherosclerosis of aorta: Secondary | ICD-10-CM | POA: Insufficient documentation

## 2017-07-16 DIAGNOSIS — J90 Pleural effusion, not elsewhere classified: Secondary | ICD-10-CM | POA: Insufficient documentation

## 2017-07-16 DIAGNOSIS — C3490 Malignant neoplasm of unspecified part of unspecified bronchus or lung: Secondary | ICD-10-CM | POA: Diagnosis not present

## 2017-07-16 DIAGNOSIS — J439 Emphysema, unspecified: Secondary | ICD-10-CM | POA: Diagnosis not present

## 2017-07-16 DIAGNOSIS — C3411 Malignant neoplasm of upper lobe, right bronchus or lung: Secondary | ICD-10-CM | POA: Diagnosis not present

## 2017-07-16 DIAGNOSIS — R05 Cough: Secondary | ICD-10-CM | POA: Insufficient documentation

## 2017-07-16 DIAGNOSIS — I2510 Atherosclerotic heart disease of native coronary artery without angina pectoris: Secondary | ICD-10-CM | POA: Diagnosis not present

## 2017-07-16 DIAGNOSIS — R918 Other nonspecific abnormal finding of lung field: Secondary | ICD-10-CM | POA: Diagnosis not present

## 2017-07-16 LAB — CBC WITH DIFFERENTIAL (CANCER CENTER ONLY)
BASOS ABS: 0.1 10*3/uL (ref 0.0–0.1)
Basophils Relative: 1 %
EOS ABS: 0.3 10*3/uL (ref 0.0–0.5)
Eosinophils Relative: 3 %
HEMATOCRIT: 41.1 % (ref 38.4–49.9)
HEMOGLOBIN: 13.4 g/dL (ref 13.0–17.1)
LYMPHS PCT: 26 %
Lymphs Abs: 2.1 10*3/uL (ref 0.9–3.3)
MCH: 29.3 pg (ref 27.2–33.4)
MCHC: 32.6 g/dL (ref 32.0–36.0)
MCV: 89.7 fL (ref 79.3–98.0)
Monocytes Absolute: 0.9 10*3/uL (ref 0.1–0.9)
Monocytes Relative: 12 %
NEUTROS ABS: 4.7 10*3/uL (ref 1.5–6.5)
NEUTROS PCT: 58 %
Platelet Count: 213 10*3/uL (ref 140–400)
RBC: 4.58 MIL/uL (ref 4.20–5.82)
RDW: 18.5 % — ABNORMAL HIGH (ref 11.0–14.6)
WBC Count: 8 10*3/uL (ref 4.0–10.3)

## 2017-07-16 LAB — CMP (CANCER CENTER ONLY)
ALBUMIN: 3.1 g/dL — AB (ref 3.5–5.0)
ALT: 16 U/L (ref 0–55)
ANION GAP: 10 (ref 3–11)
AST: 18 U/L (ref 5–34)
Alkaline Phosphatase: 81 U/L (ref 40–150)
BUN: 10 mg/dL (ref 7–26)
CALCIUM: 9.7 mg/dL (ref 8.4–10.4)
CHLORIDE: 101 mmol/L (ref 98–109)
CO2: 26 mmol/L (ref 22–29)
Creatinine: 0.91 mg/dL (ref 0.70–1.30)
GFR, Estimated: >60 mL/min (ref >60)
Glucose, Bld: 107 mg/dL (ref 70–140)
POTASSIUM: 5 mmol/L (ref 3.5–5.1)
SODIUM: 137 mmol/L (ref 136–145)
Total Bilirubin: 0.8 mg/dL (ref 0.2–1.2)
Total Protein: 7.5 g/dL (ref 6.4–8.3)

## 2017-07-16 MED ORDER — IOPAMIDOL (ISOVUE-300) INJECTION 61%
75.0000 mL | Freq: Once | INTRAVENOUS | Status: AC | PRN
  Administered 2017-07-16: 75 mL via INTRAVENOUS

## 2017-07-16 MED ORDER — IOPAMIDOL (ISOVUE-300) INJECTION 61%
INTRAVENOUS | Status: AC
  Filled 2017-07-16: qty 75

## 2017-07-17 ENCOUNTER — Ambulatory Visit (INDEPENDENT_AMBULATORY_CARE_PROVIDER_SITE_OTHER): Payer: Medicare Other | Admitting: Rehabilitative and Restorative Service Providers"

## 2017-07-17 ENCOUNTER — Encounter: Payer: Self-pay | Admitting: Rehabilitative and Restorative Service Providers"

## 2017-07-17 DIAGNOSIS — R29898 Other symptoms and signs involving the musculoskeletal system: Secondary | ICD-10-CM

## 2017-07-17 DIAGNOSIS — M542 Cervicalgia: Secondary | ICD-10-CM

## 2017-07-17 DIAGNOSIS — M25512 Pain in left shoulder: Secondary | ICD-10-CM | POA: Diagnosis present

## 2017-07-17 DIAGNOSIS — R293 Abnormal posture: Secondary | ICD-10-CM | POA: Diagnosis not present

## 2017-07-17 NOTE — Therapy (Addendum)
[facility]  [place], [place], [address] Phone: [phone]   Fax:  [phone]  Physical Therapy Treatment  Patient Details  Name: Randy [name] MRN: [id number] Date of Birth: 1946-03-22 Referring Provider: Dr Georgina Snell    Encounter Date: 07/17/2017  PT End of Session - 07/17/17 0920    Visit Number  2    Number of Visits  12    Date for PT Re-Evaluation  08/21/17    PT Start Time  0845    PT Stop Time  0927    PT Time Calculation (min)  42 min    Activity Tolerance  Patient tolerated treatment well       Past Medical History:  Diagnosis Date  . 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 Repeat US due 2023  . Carotid arterial disease ([place])    a. S/p R CEA;  b. 05/2015 Carotid U/S: RICA 7-67%, LICA 34-19%.  . Coronary artery disease    a. 2012 MI in [place] w/ PCI  //  b. 04/2013 MV: inflat scar, no ischemia.  //  c.   NSTEMI 5/17 >> Promus DES to LCx and POBA to OM1 (ISR).   . Diabetes mellitus without complication ([place])   . Grade II diastolic dysfunction 37/90/2409  . History of cardiac catheterization 10/21/15   a. NSTEMI 5/17: oLAD 40, pLCx 90 (Promus DES), OM2 90 ISR (POBA), oRCA 25   . History of echocardiogram    a. Mild LVH, EF 50-55%, possible inferolateral HK, grade 2 diastolic dysfunction, trivial AI, MAC  . History of nuclear stress test 04/2013   a. Myoview 12/14: Intermediate risk, prior inferolateral MI, no ischemia, EF 45%  . Hx of leukocytosis   . Hyperlipidemia   . Hypertension   . Hypertensive heart disease   . Morbid obesity ([place])   . Tobacco abuse     Past Surgical History:  Procedure Laterality Date  . CARDIAC CATHETERIZATION    . CARDIAC CATHETERIZATION N/A 10/21/2015   Procedure: Left Heart Cath and Coronary Angiography;  Surgeon: [name], MD;  Location: [facility];  Service: Cardiovascular;  Laterality: N/A;  . CARDIAC CATHETERIZATION N/A 10/21/2015    Procedure: Coronary Stent Intervention;  Surgeon: [name], MD;  Location: [facility];  Service: Cardiovascular;  Laterality: N/A;  . CARDIAC CATHETERIZATION N/A 10/21/2015   Procedure: Coronary Balloon Angioplasty;  Surgeon: [name], MD;  Location: [facility];  Service: Cardiovascular;  Laterality: N/A;  . CAROTID ENDARTERECTOMY    . CORONARY ANGIOPLASTY    . CORONARY STENT PLACEMENT    . VIDEO BRONCHOSCOPY Bilateral 04/03/2017   Procedure: VIDEO BRONCHOSCOPY WITH FLUORO;  Surgeon: [name], MD;  Location: [facility];  Service: Cardiopulmonary;  Laterality: Bilateral;    There were no vitals filed for this visit.  Subjective Assessment - 07/17/17 0844    Subjective  Randy [name] reports that his shoulder is feeling better. He has been doing his exercises at home and feels that has helped.     Currently in Pain?  No/denies         [facility] PT Assessment - 07/17/17 0001      Assessment   Medical Diagnosis  Lt trapezius pain     Referring Provider  Dr Georgina Snell     Onset Date/Surgical Date  06/22/17 injury 8/17     Hand Dominance  Right    Next MD Visit  PRN  Prior Therapy  no       Observation/Other Assessments   Focus on Therapeutic Outcomes (FOTO)   29% limited      AROM   Left Shoulder Extension  65 Degrees    Left Shoulder Flexion  140 Degrees    Left Shoulder ABduction  150 Degrees    Left Shoulder Internal Rotation  32 Degrees    Left Shoulder External Rotation  90 Degrees    Cervical Flexion  52    Cervical Extension  45    Cervical - Right Side Bend  33    Cervical - Left Side Bend  30    Cervical - Right Rotation  73    Cervical - Left Rotation  62                  [facility] Adult PT Treatment/Exercise - 07/17/17 0001      Neuro Re-ed    Neuro Re-ed Details   working on posture and alignment       Shoulder Exercises: Standing   Extension  Strengthening;Both;20 reps;Theraband    Theraband Level (Shoulder  Extension)  Level 2 (Red)    Row  Strengthening;Both;20 reps;Theraband    Theraband Level (Shoulder Row)  Level 2 (Red)    Retraction  Strengthening;Both;20 reps;Theraband    Theraband Level (Shoulder Retraction)  Level 2 (Red)      Shoulder Exercises: Pulleys   Flexion  -- 10 sec hold x 10 reps       Shoulder Exercises: Stretch   Other Shoulder Stretches  axial extension chin tuck - 10 sec x 5     Other Shoulder Stretches  scap squeeze 10 sec x 10; L's x 10' W's x 10 with swim noodle  - no pain       Moist Heat Therapy   Number Minutes Moist Heat  12 Minutes    Moist Heat Location  Shoulder Lt shoulder girdle       Manual Therapy   Manual Therapy  Soft tissue mobilization;Myofascial release    Soft tissue mobilization  deep tissue work Lt shoulder girdle area at upper trap/leveator              PT Education - 07/17/17 0850    Education provided  Yes    Education Details  HEP     Person(s) Educated  Patient    Methods  Explanation;Demonstration;Tactile cues;Handout;Verbal cues    Comprehension  Verbalized understanding;Returned demonstration;Verbal cues required;Tactile cues required          PT Long Term Goals - 07/17/17 0843      PT LONG TERM GOAL #1   Title  Improve cervical and thoracic posture and alignment with patient to demonstrate engagement of posterior shoulder girdle musculature 08/21/17    Time  6    Period  Weeks    Status  Achieved      PT LONG TERM GOAL #2   Title  Increase cervical lateral flexion and rotation to Lt by 5-8 degrees 08/21/17    Time  6    Period  Weeks    Status  Achieved      PT LONG TERM GOAL #3   Title  Decrease pain Lt posterior shoulder girdle strength by 50% allowing patient to use Lt UE for functional activities with no more than 2-3/10 pain 08/21/17    Time  6    Period  Weeks    Status  Achieved      PT  LONG TERM GOAL #4   Title  Independent in HEP 08/21/17    Time  6    Period  Weeks    Status  Achieved      PT  LONG TERM GOAL #5   Title  Improve FOTO to </= 31% limitation 08/21/17    Time  6    Period  Weeks    Status  Achieved 29% limited            Plan - 07/17/17 0859    Clinical Impression Statement  Good progress with HEP. Patient reports resolution of Lt shoulder pain. Patient has worked on [employer] and [employer], music for home use. Patient feels confident in continuing with independent HEP and feels ready to stop therapy. He will call to schedule additional appopintments in te next two weeks if he needs to return. Patient demonstrates good improvement in cervical and Lt shoulder ROM and has decreased palpable tightness through the Lt shoulder girdle. .Goals of therapy have been accomplished. Patient will be d/c'ed to independent HEP.     Rehab Potential  Good    Clinical Impairments Affecting Rehab Potential  * no modalities due to Rt lobe lung cancer     PT Frequency  2x / week    PT Duration  6 weeks    PT Treatment/Interventions  Patient/family education;ADLs/Self Care Home Management;Cryotherapy;Iontophoresis 78m/ml Dexamethasone;Moist Heat;Dry needling;Manual techniques;Neuromuscular re-education;Therapeutic activities;Therapeutic exercise    PT Next Visit Plan  d/c to independent HEP     Consulted and Agree with Plan of Care  Patient       Patient will benefit from skilled therapeutic intervention in order to improve the following deficits and impairments:  Impaired UE functional use, Postural dysfunction, Improper body mechanics, Pain, Increased fascial restricitons, Increased muscle spasms, Hypomobility, Decreased mobility, Decreased range of motion, Decreased activity tolerance  Visit Diagnosis: Acute pain of left shoulder  Cervicalgia  Abnormal posture  Other symptoms and signs involving the musculoskeletal system     Problem List Patient Active Problem List   Diagnosis Date Noted  . Encounter for antineoplastic chemotherapy 06/11/2017  . 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 Abdominal aortic ectasia ([facility] 03/23/2017  . Community acquired pneumonia of right upper lobe of lung ([facility] 03/20/2017  . Mass of upper lobe of right lung 03/14/2017  . Aortic atherosclerosis ([facility] 03/14/2017  . Chronic obstructive pulmonary disease ([facility] 03/13/2017  . Encounter for screening for lung cancer 03/13/2017  . Grade II diastolic dysfunction 180/99/8338 . Hypertension goal BP (blood pressure) < 140/90 03/12/2017  . Cough present for greater than 3 weeks 03/12/2017  . Diabetes mellitus without complication ([facility] 125/09/3974 . Former heavy cigarette smoker (20-39 per day) 03/12/2017  . NSTEMI (non-ST elevated myocardial infarction) ([facility] 10/20/2015  . Carotid arterial disease ([facility] [facility]   . Hyperlipidemia   . Hypertensive heart disease   . Coronary artery disease   . Leukocytosis   . Hypertensive heart disease without heart failure     Jaleesa Cervi PNilda SimmerPT, MPH  07/17/2017, 9:21 AM  [facility] [place] [address]Phone: [phone]  Fax:  [phone] Name: DBILAAL LEIBMRN: [id number] of Birth: [birth year] PHYSICAL THERAPY DISCHARGE SUMMARY  Visits from Start of Care: 2  Current functional level related to goals / functional outcomes: See progress note for discharge status   Remaining deficits: Needs to continue with  HEP    Education / Equipment: HEP  Plan: Patient agrees to discharge.  Patient goals were met. Patient is being discharged due to meeting the stated rehab goals.  ?????     Morry Veiga P. Helene Kelp PT, MPH 08/01/17 12:56 PM

## 2017-07-17 NOTE — Patient Instructions (Signed)
Resisted External Rotation: in Neutral - Bilateral   PALMS UP Sit or stand, tubing in both hands, elbows at sides, bent to 90, forearms forward. Pinch shoulder blades together and rotate forearms out. Keep elbows at sides. Repeat __10__ times per set. Do _2-3___ sets per session. Do _2-3___ sessions per day.   Low Row: Standing   Face anchor, feet shoulder width apart. Palms up, pull arms back, squeezing shoulder blades together. Repeat 10__ times per set. Do 2-3__ sets per session. Do 1-2__ sessions per day Anchor Height: Waist    Strengthening: Resisted Extension   Hold tubing in right hand, arm forward. Pull arm back, elbow straight. Repeat _10___ times per set. Do 2-3____ sets per session. Do 1-2___ sessions per day.

## 2017-07-18 ENCOUNTER — Other Ambulatory Visit: Payer: Self-pay | Admitting: Physician Assistant

## 2017-07-18 DIAGNOSIS — R05 Cough: Secondary | ICD-10-CM

## 2017-07-18 DIAGNOSIS — R058 Other specified cough: Secondary | ICD-10-CM

## 2017-07-20 ENCOUNTER — Encounter: Payer: Medicare Other | Admitting: Rehabilitative and Restorative Service Providers"

## 2017-07-22 ENCOUNTER — Other Ambulatory Visit: Payer: Self-pay | Admitting: Physician Assistant

## 2017-07-22 DIAGNOSIS — J189 Pneumonia, unspecified organism: Secondary | ICD-10-CM

## 2017-07-24 ENCOUNTER — Ambulatory Visit
Admission: RE | Admit: 2017-07-24 | Discharge: 2017-07-24 | Disposition: A | Discharge status: Home / Self Care | Payer: Medicare Other | Source: Ambulatory Visit | Attending: Radiation Oncology | Admitting: Radiation Oncology

## 2017-07-24 ENCOUNTER — Inpatient Hospital Stay (HOSPITAL_BASED_OUTPATIENT_CLINIC_OR_DEPARTMENT_OTHER): Payer: Medicare Other | Admitting: Oncology

## 2017-07-24 ENCOUNTER — Encounter: Payer: Self-pay | Admitting: Radiation Oncology

## 2017-07-24 ENCOUNTER — Other Ambulatory Visit: Payer: Self-pay | Admitting: Internal Medicine

## 2017-07-24 ENCOUNTER — Telehealth: Payer: Self-pay | Admitting: Oncology

## 2017-07-24 ENCOUNTER — Other Ambulatory Visit: Payer: Self-pay

## 2017-07-24 ENCOUNTER — Encounter: Payer: Self-pay | Admitting: Oncology

## 2017-07-24 VITALS — BP 140/77 | HR 83 | Temp 97.6°F | Resp 20 | Wt 203.4 lb

## 2017-07-24 VITALS — BP 140/77 | HR 83 | Temp 97.6°F | Resp 20 | Ht 71.0 in | Wt 203.4 lb

## 2017-07-24 DIAGNOSIS — Z7984 Long term (current) use of oral hypoglycemic drugs: Secondary | ICD-10-CM | POA: Diagnosis not present

## 2017-07-24 DIAGNOSIS — C3491 Malignant neoplasm of unspecified part of right bronchus or lung: Secondary | ICD-10-CM

## 2017-07-24 DIAGNOSIS — J90 Pleural effusion, not elsewhere classified: Secondary | ICD-10-CM | POA: Diagnosis not present

## 2017-07-24 DIAGNOSIS — J439 Emphysema, unspecified: Secondary | ICD-10-CM | POA: Insufficient documentation

## 2017-07-24 DIAGNOSIS — Z923 Personal history of irradiation: Secondary | ICD-10-CM | POA: Diagnosis not present

## 2017-07-24 DIAGNOSIS — K209 Esophagitis, unspecified: Secondary | ICD-10-CM | POA: Insufficient documentation

## 2017-07-24 DIAGNOSIS — I700 Atherosclerosis of aorta: Secondary | ICD-10-CM | POA: Insufficient documentation

## 2017-07-24 DIAGNOSIS — R05 Cough: Secondary | ICD-10-CM | POA: Diagnosis not present

## 2017-07-24 DIAGNOSIS — Z7982 Long term (current) use of aspirin: Secondary | ICD-10-CM | POA: Insufficient documentation

## 2017-07-24 DIAGNOSIS — C3411 Malignant neoplasm of upper lobe, right bronchus or lung: Secondary | ICD-10-CM | POA: Diagnosis not present

## 2017-07-24 DIAGNOSIS — Z79899 Other long term (current) drug therapy: Secondary | ICD-10-CM | POA: Diagnosis not present

## 2017-07-24 DIAGNOSIS — R5383 Other fatigue: Secondary | ICD-10-CM

## 2017-07-24 DIAGNOSIS — Z5112 Encounter for antineoplastic immunotherapy: Secondary | ICD-10-CM

## 2017-07-24 NOTE — Progress Notes (Signed)
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  PRIOR THERAPY: A course of concurrent chemoradiation with weekly carboplatin for AUC of 2 and paclitaxel 45 mg/M2.Status post6cycles.  CURRENT THERAPY: Consolidation immunotherapy with Imfinzi given every 2 weeks.  First dose expected 07/30/2017.  INTERVAL HISTORY: Randy [name] 72 y.o. male returns for routine follow-up visit accompanied by his wife.  The patient is feeling fine today and has no specific complaints except for nonproductive cough.  He has recovered well from his course of concurrent chemoradiation.  The patient denies fevers and chills.  Denies chest pain, shortness of breath, hemoptysis.  Denies odynophagia.  Denies nausea, vomiting, constipation, diarrhea.  Denies decreased appetite or weight loss.  The patient had a recent CT scan and is here to discuss the results and treatment options.  MEDICAL HISTORY: Past Medical History:  Diagnosis Date  . 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 Repeat US due 2023  . Carotid arterial disease ([place])    a. S/p R CEA;  b. 05/2015 Carotid U/S: RICA 6-04%, LICA 54-09%.  . Coronary artery disease    a. 2012 MI in [place] w/ PCI  //  b. 04/2013 MV: inflat scar, no ischemia.  //  c.   NSTEMI 5/17 >> Promus DES to LCx and POBA to OM1 (ISR).   . Diabetes mellitus without complication ([place])   . Grade II diastolic dysfunction 81/19/1478  . History of cardiac catheterization 10/21/15   a. NSTEMI 5/17: oLAD 40, pLCx 90 (Promus DES), OM2 90 ISR (POBA), oRCA 25   . History of echocardiogram    a. Mild LVH, EF  50-55%, possible inferolateral HK, grade 2 diastolic dysfunction, trivial AI, MAC  . History of nuclear stress test 04/2013   a. Myoview 12/14: Intermediate risk, prior inferolateral MI, no ischemia, EF 45%  . Hx of leukocytosis   . Hyperlipidemia   . Hypertension   . Hypertensive heart disease   . Morbid obesity ([place])   . Tobacco abuse     ALLERGIES:  is allergic to brilinta [ticagrelor].  MEDICATIONS:  Current Outpatient Medications  Medication Sig Dispense Refill  . aspirin EC 81 MG tablet Take 81 mg by mouth daily.    [name] atorvastatin (LIPITOR) 40 MG tablet TAKE 1 TABLET BY MOUTH EVERY DAY 90 tablet 3  . clopidogrel (PLAVIX) 75 MG tablet Take 1 tablet (75 mg total) by mouth daily. 90 tablet 3  . cyclobenzaprine (FLEXERIL) 5 MG tablet Take 1-2 tablets (5-10 mg total) by mouth 3 times/day as needed-between meals & bedtime for muscle spasms. 30 tablet 1  . metFORMIN (GLUCOPHAGE) 500 MG tablet Take 0.5 tablets (250 mg total) by mouth 2 (two) times daily with a meal. 90 tablet 0  . metoprolol succinate (TOPROL-XL) 25 MG 24 hr tablet Take 1 tablet (25 mg total) by mouth 2 (two) times daily. 180 tablet 3  . prochlorperazine (COMPAZINE) 10 MG tablet Take 1 tablet (10 mg total) by mouth every 6 (six) hours as needed for nausea or vomiting. 30 tablet 2  . VENTOLIN HFA 108 (90 Base) MCG/ACT inhaler Inhale 1 puff every 4 (four) hours as needed into the lungs for wheezing  or shortness of breath. 1 Inhaler 5  . sucralfate (CARAFATE) 1 g tablet Take 1 g by mouth 2 (two) times daily.     No current facility-administered medications for this visit.     SURGICAL HISTORY:  Past Surgical History:  Procedure Laterality Date  . CARDIAC CATHETERIZATION    . CARDIAC CATHETERIZATION N/A 10/21/2015   Procedure: Left Heart Cath and Coronary Angiography;  Surgeon: [name], MD;  Location: [place] [facility];  Service: Cardiovascular;  Laterality: N/A;  . CARDIAC CATHETERIZATION N/A 10/21/2015    Procedure: Coronary Stent Intervention;  Surgeon: [name], MD;  Location: [place] [facility];  Service: Cardiovascular;  Laterality: N/A;  . CARDIAC CATHETERIZATION N/A 10/21/2015   Procedure: Coronary Balloon Angioplasty;  Surgeon: [name], MD;  Location: [place] [facility];  Service: Cardiovascular;  Laterality: N/A;  . CAROTID ENDARTERECTOMY    . CORONARY ANGIOPLASTY    . CORONARY STENT PLACEMENT    . VIDEO BRONCHOSCOPY Bilateral 04/03/2017   Procedure: VIDEO BRONCHOSCOPY WITH FLUORO;  Surgeon: [name], MD;  Location: [facility];  Service: Cardiopulmonary;  Laterality: Bilateral;    REVIEW OF SYSTEMS:   Review of Systems  Constitutional: Negative for appetite change, chills, fatigue, fever and unexpected weight change.  HENT:   Negative for mouth sores, nosebleeds, sore throat and trouble swallowing.   Eyes: Negative for eye problems and icterus.  Respiratory: Negative for hemoptysis, shortness of breath and wheezing.  Positive for cough.  Cardiovascular: Negative for chest pain and leg swelling.  Gastrointestinal: Negative for abdominal pain, constipation, diarrhea, nausea and vomiting.  Genitourinary: Negative for bladder incontinence, difficulty urinating, dysuria, frequency and hematuria.   Musculoskeletal: Negative for back pain, gait problem, neck pain and neck stiffness.  Skin: Negative for itching and rash.  Neurological: Negative for dizziness, extremity weakness, gait problem, headaches, light-headedness and seizures.  Hematological: Negative for adenopathy. Does not bruise/bleed easily.  Psychiatric/Behavioral: Negative for confusion, depression and sleep disturbance. The patient is not nervous/anxious.     PHYSICAL EXAMINATION:  Blood pressure 140/77, pulse 83, temperature 97.6 F (36.4 C), temperature source Oral, resp. rate 20, height 5' 11"  (1.803 m), weight 203 lb 6 oz (92.3 kg), SpO2 100 %.  ECOG PERFORMANCE STATUS: 1 - Symptomatic  but completely ambulatory  Physical Exam  Constitutional: Oriented to person, place, and time and well-developed, well-nourished, and in no distress. No distress.  HENT:  Head: Normocephalic and atraumatic.  Mouth/Throat: Oropharynx is clear and moist. No oropharyngeal exudate.  Eyes: Conjunctivae are normal. Right eye exhibits no discharge. Left eye exhibits no discharge. No scleral icterus.  Neck: Normal range of motion. Neck supple.  Cardiovascular: Normal rate, regular rhythm, normal heart sounds and intact distal pulses.   Pulmonary/Chest: Effort normal and breath sounds normal. No respiratory distress. No wheezes. No rales.  Abdominal: Soft. Bowel sounds are normal. Exhibits no distension and no mass. There is no tenderness.  Musculoskeletal: Normal range of motion. Exhibits no edema.  Lymphadenopathy:    No cervical adenopathy.  Neurological: Alert and oriented to person, place, and time. Exhibits normal muscle tone. Gait normal. Coordination normal.  Skin: Skin is warm and dry. No rash noted. Not diaphoretic. No erythema. No pallor.  Psychiatric: Mood, memory and judgment normal.  Vitals reviewed.  LABORATORY DATA: Lab Results  Component Value Date   WBC 8.0 07/16/2017   HGB 12.7 (L) 06/25/2017   HCT 41.1 07/16/2017   MCV 89.7 07/16/2017   PLT 213 07/16/2017  Chemistry      Component Value Date/Time   NA 137 07/16/2017 1124   NA 133 (L) 05/28/2017 0932   K 5.0 07/16/2017 1124   K 4.4 05/28/2017 0932   CL 101 07/16/2017 1124   CO2 26 07/16/2017 1124   CO2 24 05/28/2017 0932   BUN 10 07/16/2017 1124   BUN 9.3 05/28/2017 0932   CREATININE 0.91 07/16/2017 1124   CREATININE 0.8 05/28/2017 0932      Component Value Date/Time   CALCIUM 9.7 07/16/2017 1124   CALCIUM 8.5 05/28/2017 0932   ALKPHOS 81 07/16/2017 1124   ALKPHOS 68 05/28/2017 0932   AST 18 07/16/2017 1124   AST 13 05/28/2017 0932   ALT 16 07/16/2017 1124   ALT 16 05/28/2017 0932   BILITOT 0.8  07/16/2017 1124   BILITOT 0.40 05/28/2017 0932       RADIOGRAPHIC STUDIES:  Dg Cervical Spine Complete  Result Date: 07/05/2017 CLINICAL DATA:  Left-sided neck pain following fall several months ago, initial encounter EXAM: CERVICAL SPINE - COMPLETE 4+ VIEW COMPARISON:  None. FINDINGS: Seven cervical segments are well visualized. Mild neural foraminal narrowing is noted on the left at C4-5 and C5-6. Facet hypertrophic changes are noted bilaterally but worse on the left than the right. The odontoid is within normal limits. No acute fractures noted. IMPRESSION: Degenerative change with neural foraminal narrowing on the left predominately related to facet hypertrophic changes. Electronically Signed   By: Inez Catalina M.D.   On: 07/05/2017 16:16   Ct Chest W Contrast  Result Date: 07/16/2017 CLINICAL DATA:  Lung cancer.  Shortness of breath and cough. EXAM: CT CHEST WITH CONTRAST TECHNIQUE: Multidetector CT imaging of the chest was performed during intravenous contrast administration. CONTRAST:  54m ISOVUE-300 IOPAMIDOL (ISOVUE-300) INJECTION 61% COMPARISON:  PET 04/16/2017 and CT chest 03/13/2017. FINDINGS: Cardiovascular: Atherosclerotic calcification of the arterial vasculature, including coronary arteries. Heart size normal. No pericardial effusion. Mediastinum/Nodes: No pathologically enlarged mediastinal or left hilar lymph nodes. Small lymph nodes are seen in the right hilar region. No axillary adenopathy. Esophagus may be slightly thick-walled. Lungs/Pleura: Centrilobular and paraseptal emphysema. Spiculated cavitary mass in the right upper lobe extends medially into the mediastinal fat with vascular encasement. Lesion is stable to minimally decreased in size, previously measuring 4.4 x 4.8 cm. It obstructs the right upper lobe bronchus with postobstructive nodular areas of consolidation and ground-glass in the right upper lobe. Additional scattered pulmonary nodules measure 5 mm or less in size,  stable. Trace right pleural effusion. Airway is otherwise unremarkable. Upper Abdomen: Visualized portions of the liver and gallbladder are unremarkable. Slight nodular thickening of the body of the right adrenal gland. Visualized portions of the left adrenal gland, right kidney, spleen, pancreas, stomach and bowel are grossly unremarkable. No upper abdominal adenopathy. Musculoskeletal: No worrisome lytic or sclerotic lesions. Degenerative changes are seen in the spine. IMPRESSION: 1. Spiculated right upper lobe mass invades the adjacent mediastinum and obstructs the right upper lobe bronchus. It is stable to minimally decreased in size. 2. Trace right pleural effusion. 3. Aortic atherosclerosis (ICD10-170.0). Coronary artery calcification. 4.  Emphysema (ICD10-J43.9). Electronically Signed   By: MLorin PicketM.D.   On: 07/16/2017 15:26     ASSESSMENT/PLAN:  Stage III squamous cell carcinoma of right lung ([facility] This is a very pleasant 72year old white male with a stage IIIb non-small cell lung cancer, squamous cell carcinoma. The patient received a course of concurrent chemoradiation with weekly carboplatin and paclitaxel.Status post 6  cycles.  He tolerated the treatment fairly well with no significant adverse effects. He had a recent restaging CT scan of the chest and is here to discuss the results.  The patient was seen with Dr. [name].  CT scan results were discussed with the patient and his wife which show slight improvement in his lung cancer.  We discussed with the patient and his wife options for management of his condition at this point including close observation and monitoring versus consolidation treatment with immunotherapy with Imfinzi (Durvalumab) 10 MG/KG every 2 weeks for a total of one year unless the patient developed toxicity or disease progression. The patient is interested in the treatment with consolidation Imfinzi (Durvalumab). Adverse effects of this treatment were  discussed including but not limited to immunotherapy mediated skin rash, diarrhea, or inflammation of the lung, kidney, liver, thyroid or other endocrine dysfunction including type 1 diabetes mellitus. The patient would like to proceed with treatment as planned and he is expected to start the first cycle of this treatment on 07/30/2017.  Patient will have a follow-up visit for evaluation prior to cycle #2 of Imfinzi.  For his cough, he may use Delsym as needed.  He was advised to call immediately if he has any concerning symptoms in the interval. The patient voices understanding of current disease status and treatment options and is in agreement with the current care plan. All questions were answered. The patient knows to call the clinic with any problems, questions or concerns. We can certainly see the patient much sooner if necessary.  Orders Placed This Encounter  Procedures  . CMP ([place] only)    Standing Status:   Standing    Number of Occurrences:   20    Standing Expiration Date:   07/24/2018  . CBC with Differential (Cancer Center Only)    Standing Status:   Standing    Number of Occurrences:   20    Standing Expiration Date:   07/24/2018  . TSH    Standing Status:   Standing    Number of Occurrences:   12    Standing Expiration Date:   07/24/2018    Mikey Bussing, DNP, AGPCNP-BC, AOCNP 07/24/17   ADDENDUM: Hematology/Oncology Attending: I had a face-to-face encounter with the patient.  I recommended his care plan.  This is a very pleasant 72 years old white male with a stage IIIb non-small cell lung cancer, squamous cell carcinoma status post a course of concurrent chemoradiation with weekly carboplatin and paclitaxel for 6 cycles with partial response. The patient had repeat CT scan of the chest performed recently that showed stable to mild decrease in his disease. I personally and independently reviewed the scan images and discussed the results with the patient and  his wife. I discussed with the patient options for management of his condition at this point including continuous observation and close monitoring versus consideration of treatment with consolidation immunotherapy was Imfinzi (Durvalumab) 10 mg/KG every 2 weeks for a total of 1 year unless the patient develops any significant toxicity or disease progression. I discussed with him the adverse effect of the immunotherapy including but not limited to immunotherapy mediated skin rash, diarrhea, inflammation of the lung, kidney, liver, thyroid or other endocrine dysfunction. The patient is interested in proceeding with the treatment with immunotherapy and he is expected to start the first cycle of this treatment next week. He will come back for follow-up visit in 3 weeks for evaluation with the start of cycle #2.  The patient was advised to call immediately if he has any concerning symptoms in the interval.  Disclaimer: This note was dictated with voice recognition software. Similar sounding words can inadvertently be transcribed and may be missed upon review. Eilleen Kempf, MD 07/25/17

## 2017-07-24 NOTE — Progress Notes (Signed)
Radiation Oncology         [phone] ________________________________  Name: Randy [name] MRN: [id number]  Date of Service: 07/24/2017 DOB: 02-24-1946  Post Treatment Note  CC: [name], PA-C  Curt Bears, MD  Diagnosis:  Stage IIIA/B T4, N1-2, M0 NSCLC, squamous cell carcinoma of the right upper lobe     Interval Since Last Radiation:  5 weeks   05/08/2017 - 06/22/2017:  The patient was treated to the disease within the right lung initially to a dose of 60 Gy using a 3 field, 3-D conformal technique. The patient then received a cone down boost treatment for an additional 6 Gy. This yielded a final total dose of 66 Gy.    Narrative:  The patient returns today for routine follow-up.  The patient tolerated radiotherapy with limited symptoms of esophagitis.                              On review of systems, the patient states he's doing great and eating well without limitations. He underwent repeat CT last week and this shows stable to minimally improved change in the chest. He is seeing Dr. [name] after my visit today.  ALLERGIES:  is allergic to brilinta [ticagrelor].  Meds: Current Outpatient Medications  Medication Sig Dispense Refill  . aspirin EC 81 MG tablet Take 81 mg by mouth daily.    [name] atorvastatin (LIPITOR) 40 MG tablet TAKE 1 TABLET BY MOUTH EVERY DAY 90 tablet 3  . clopidogrel (PLAVIX) 75 MG tablet Take 1 tablet (75 mg total) by mouth daily. 90 tablet 3  . metFORMIN (GLUCOPHAGE) 500 MG tablet Take 0.5 tablets (250 mg total) by mouth 2 (two) times daily with a meal. 90 tablet 0  . metoprolol succinate (TOPROL-XL) 25 MG 24 hr tablet Take 1 tablet (25 mg total) by mouth 2 (two) times daily. 180 tablet 3  . VENTOLIN HFA 108 (90 Base) MCG/ACT inhaler Inhale 1 puff every 4 (four) hours as needed into the lungs for wheezing or shortness of breath. 1 Inhaler 5  . cyclobenzaprine (FLEXERIL) 5 MG tablet Take 1-2 tablets (5-10 mg total) by mouth 3  times/day as needed-between meals & bedtime for muscle spasms. 30 tablet 1  . prochlorperazine (COMPAZINE) 10 MG tablet Take 1 tablet (10 mg total) by mouth every 6 (six) hours as needed for nausea or vomiting. 30 tablet 2  . sucralfate (CARAFATE) 1 g tablet Take 1 g by mouth 2 (two) times daily.     No current facility-administered medications for this encounter.     Physical Findings:  weight is 203 lb 6 oz (92.3 kg). His oral temperature is 97.6 F (36.4 C). His blood pressure is 140/77 and his pulse is 83. His respiration is 20 and oxygen saturation is 100%.  Pain Assessment Pain Score: 0-No pain/10 In general this is a well appearing caucasian male in no acute distress. He's alert and oriented x4 and appropriate throughout the examination. Cardiopulmonary assessment is negative for acute distress and he exhibits normal effort. Chest is clear to auscultation bilaterally.  Lab Findings: Lab Results  Component Value Date   WBC 8.0 07/16/2017   HGB 12.7 (L) 06/25/2017   HCT 41.1 07/16/2017   MCV 89.7 07/16/2017   PLT 213 07/16/2017     Radiographic Findings: Dg Cervical Spine Complete  Result Date: 07/05/2017 CLINICAL DATA:  Left-sided neck pain following fall several months ago, initial encounter  EXAM: CERVICAL SPINE - COMPLETE 4+ VIEW COMPARISON:  None. FINDINGS: Seven cervical segments are well visualized. Mild neural foraminal narrowing is noted on the left at C4-5 and C5-6. Facet hypertrophic changes are noted bilaterally but worse on the left than the right. The odontoid is within normal limits. No acute fractures noted. IMPRESSION: Degenerative change with neural foraminal narrowing on the left predominately related to facet hypertrophic changes. Electronically Signed   By: Inez Catalina M.D.   On: 07/05/2017 16:16   Ct Chest W Contrast  Result Date: 07/16/2017 CLINICAL DATA:  Lung cancer.  Shortness of breath and cough. EXAM: CT CHEST WITH CONTRAST TECHNIQUE: Multidetector CT  imaging of the chest was performed during intravenous contrast administration. CONTRAST:  64mL ISOVUE-300 IOPAMIDOL (ISOVUE-300) INJECTION 61% COMPARISON:  PET 04/16/2017 and CT chest 03/13/2017. FINDINGS: Cardiovascular: Atherosclerotic calcification of the arterial vasculature, including coronary arteries. Heart size normal. No pericardial effusion. Mediastinum/Nodes: No pathologically enlarged mediastinal or left hilar lymph nodes. Small lymph nodes are seen in the right hilar region. No axillary adenopathy. Esophagus may be slightly thick-walled. Lungs/Pleura: Centrilobular and paraseptal emphysema. Spiculated cavitary mass in the right upper lobe extends medially into the mediastinal fat with vascular encasement. Lesion is stable to minimally decreased in size, previously measuring 4.4 x 4.8 cm. It obstructs the right upper lobe bronchus with postobstructive nodular areas of consolidation and ground-glass in the right upper lobe. Additional scattered pulmonary nodules measure 5 mm or less in size, stable. Trace right pleural effusion. Airway is otherwise unremarkable. Upper Abdomen: Visualized portions of the liver and gallbladder are unremarkable. Slight nodular thickening of the body of the right adrenal gland. Visualized portions of the left adrenal gland, right kidney, spleen, pancreas, stomach and bowel are grossly unremarkable. No upper abdominal adenopathy. Musculoskeletal: No worrisome lytic or sclerotic lesions. Degenerative changes are seen in the spine. IMPRESSION: 1. Spiculated right upper lobe mass invades the adjacent mediastinum and obstructs the right upper lobe bronchus. It is stable to minimally decreased in size. 2. Trace right pleural effusion. 3. Aortic atherosclerosis (ICD10-170.0). Coronary artery calcification. 4.  Emphysema (ICD10-J43.9). Electronically Signed   By: Lorin Picket M.D.   On: 07/16/2017 15:26    Impression/Plan: 1. Stage IIIA/B T4, N1-2, M0 NSCLC, squamous cell  carcinoma of the right upper lobe. The patient is doing well since completing treatment. We will follow him as needed moving forward and will see Dr. [name] today with Mikey Bussing, NP to review his CT imaging and role for additional systemic therapies.      Carola Rhine, PAC

## 2017-07-24 NOTE — Telephone Encounter (Signed)
Scheduled appt per 2/26 los - Gave patient AVS and calender per los.

## 2017-07-24 NOTE — Assessment & Plan Note (Signed)
This is a very pleasant 72 year old white male with a stage IIIb non-small cell lung cancer, squamous cell carcinoma. The patient received a course of concurrent chemoradiation with weekly carboplatin and paclitaxel.Status post 6 cycles.  He tolerated the treatment fairly well with no significant adverse effects. He had a recent restaging CT scan of the chest and is here to discuss the results.  The patient was seen with Dr. [name].  CT scan results were discussed with the patient and his wife which show slight improvement in his lung cancer.  We discussed with the patient and his wife options for management of his condition at this point including close observation and monitoring versus consolidation treatment with immunotherapy with Imfinzi (Durvalumab) 10 MG/KG every 2 weeks for a total of one year unless the patient developed toxicity or disease progression. The patient is interested in the treatment with consolidation Imfinzi (Durvalumab). Adverse effects of this treatment were discussed including but not limited to immunotherapy mediated skin rash, diarrhea, or inflammation of the lung, kidney, liver, thyroid or other endocrine dysfunction including type 1 diabetes mellitus. The patient would like to proceed with treatment as planned and he is expected to start the first cycle of this treatment on 07/30/2017.  Patient will have a follow-up visit for evaluation prior to cycle #2 of Imfinzi.  For his cough, he may use Delsym as needed.  He was advised to call immediately if he has any concerning symptoms in the interval. The patient voices understanding of current disease status and treatment options and is in agreement with the current care plan. All questions were answered. The patient knows to call the clinic with any problems, questions or concerns. We can certainly see the patient much sooner if necessary.

## 2017-07-24 NOTE — Progress Notes (Signed)
DISCONTINUE ON PATHWAY REGIMEN - Non-Small Cell Lung     Administer weekly:     Paclitaxel      Carboplatin   **Always confirm dose/schedule in your pharmacy ordering system**    REASON: Continuation Of Treatment PRIOR TREATMENT: [id number]: Carboplatin AUC=2 + Paclitaxel 45 mg/m2 Weekly During Radiation TREATMENT RESPONSE: Stable Disease (SD)  START ON PATHWAY REGIMEN - Non-Small Cell Lung     A cycle is every 14 days:     Durvalumab   **Always confirm dose/schedule in your pharmacy ordering system**    Patient Characteristics: Stage III - Unresectable, PS = 0, 1 AJCC T Category: T4 Current Disease Status: No Distant Mets or Local Recurrence AJCC N Category: N1 AJCC M Category: M0 AJCC 8 Stage Grouping: IIIA Performance Status: PS = 0, 1 Intent of Therapy: Curative Intent, Discussed with Patient

## 2017-07-24 NOTE — Patient Instructions (Signed)
Durvalumab injection  What is this medicine?  DURVALUMAB (dur VAL ue mab) is a monoclonal antibody. It is used to treat urothelial cancer.  This medicine may be used for other purposes; ask your health care provider or pharmacist if you have questions.  COMMON BRAND NAME(S): IMFINZI  What should I tell my health care provider before I take this medicine?  They need to know if you have any of these conditions:  -diabetes  -immune system problems  -infection  -inflammatory bowel disease  -kidney disease  -liver disease  -lung or breathing disease  -lupus  -organ transplant  -stomach or intestine problems  -thyroid disease  -an unusual or allergic reaction to durvalumab, other medicines, foods, dyes, or preservatives  -pregnant or trying to get pregnant  -breast-feeding  How should I use this medicine?  This medicine is for infusion into a vein. It is given by a health care professional in a hospital or clinic setting.  A special MedGuide will be given to you before each treatment. Be sure to read this information carefully each time.  Talk to your pediatrician regarding the use of this medicine in children. Special care may be needed.  Overdosage: If you think you have taken too much of this medicine contact a poison control center or emergency room at once.  NOTE: This medicine is only for you. Do not share this medicine with others.  What if I miss a dose?  It is important not to miss your dose. Call your doctor or health care professional if you are unable to keep an appointment.  What may interact with this medicine?  Interactions have not been studied.  This list may not describe all possible interactions. Give your health care provider a list of all the medicines, herbs, non-prescription drugs, or dietary supplements you use. Also tell them if you smoke, drink alcohol, or use illegal drugs. Some items may interact with your medicine.  What should I watch for while using this medicine?  This drug may make you  feel generally unwell. Continue your course of treatment even though you feel ill unless your doctor tells you to stop.  You may need blood work done while you are taking this medicine.  Do not become pregnant while taking this medicine or for 3 months after stopping it. Women should inform their doctor if they wish to become pregnant or think they might be pregnant. There is a potential for serious side effects to an unborn child. Talk to your health care professional or pharmacist for more information. Do not breast-feed an infant while taking this medicine or for 3 months after stopping it.  What side effects may I notice from receiving this medicine?  Side effects that you should report to your doctor or health care professional as soon as possible:  -allergic reactions like skin rash, itching or hives, swelling of the face, lips, or tongue  -black, tarry stools  -bloody or watery diarrhea  -breathing problems  -change in emotions or moods  -change in sex drive  -changes in vision  -chest pain or chest tightness  -chills  -confusion  -cough  -facial flushing  -fever  -headache  -signs and symptoms of high blood sugar such as dizziness; dry mouth; dry skin; fruity breath; nausea; stomach pain; increased hunger or thirst; increased urination  -signs and symptoms of liver injury like dark yellow or brown urine; general ill feeling or flu-like symptoms; light-colored stools; loss of appetite; nausea; right upper belly pain;   unusually weak or tired; yellowing of the eyes or skin  -stomach pain  -trouble passing urine or change in the amount of urine  -weight gain or weight loss  Side effects that usually do not require medical attention (report these to your doctor or health care professional if they continue or are bothersome):  -bone pain  -constipation  -loss of appetite  -muscle pain  -nausea  -swelling of the ankles, feet, hands  -tiredness  This list may not describe all possible side effects. Call your doctor  for medical advice about side effects. You may report side effects to FDA at 1-800-FDA-1088.  Where should I keep my medicine?  This drug is given in a hospital or clinic and will not be stored at home.  NOTE: This sheet is a summary. It may not cover all possible information. If you have questions about this medicine, talk to your doctor, pharmacist, or health care provider.  © 2018 Elsevier/Gold Standard (2015-12-17 15:50:36)

## 2017-07-25 ENCOUNTER — Encounter: Payer: Medicare Other | Admitting: Rehabilitative and Restorative Service Providers"

## 2017-07-27 ENCOUNTER — Encounter: Payer: Medicare Other | Admitting: Rehabilitative and Restorative Service Providers"

## 2017-07-30 ENCOUNTER — Inpatient Hospital Stay: Payer: Medicare Other | Attending: Internal Medicine

## 2017-07-30 ENCOUNTER — Ambulatory Visit (HOSPITAL_BASED_OUTPATIENT_CLINIC_OR_DEPARTMENT_OTHER): Payer: Medicare Other | Admitting: Medical

## 2017-07-30 ENCOUNTER — Inpatient Hospital Stay: Payer: Medicare Other

## 2017-07-30 VITALS — BP 146/78 | HR 78 | Temp 97.7°F | Resp 16

## 2017-07-30 DIAGNOSIS — C3411 Malignant neoplasm of upper lobe, right bronchus or lung: Secondary | ICD-10-CM | POA: Insufficient documentation

## 2017-07-30 DIAGNOSIS — Z79899 Other long term (current) drug therapy: Secondary | ICD-10-CM | POA: Insufficient documentation

## 2017-07-30 DIAGNOSIS — C3491 Malignant neoplasm of unspecified part of right bronchus or lung: Secondary | ICD-10-CM

## 2017-07-30 DIAGNOSIS — R5383 Other fatigue: Secondary | ICD-10-CM

## 2017-07-30 DIAGNOSIS — Z5112 Encounter for antineoplastic immunotherapy: Secondary | ICD-10-CM | POA: Diagnosis not present

## 2017-07-30 DIAGNOSIS — T8090XA Unspecified complication following infusion and therapeutic injection, initial encounter: Secondary | ICD-10-CM

## 2017-07-30 LAB — CMP (CANCER CENTER ONLY)
ALT: 18 U/L (ref 0–55)
AST: 19 U/L (ref 5–34)
Albumin: 3.2 g/dL — ABNORMAL LOW (ref 3.5–5.0)
Alkaline Phosphatase: 77 U/L (ref 40–150)
Anion gap: 11 (ref 3–11)
BUN: 11 mg/dL (ref 7–26)
CHLORIDE: 98 mmol/L (ref 98–109)
CO2: 26 mmol/L (ref 22–29)
CREATININE: 0.84 mg/dL (ref 0.70–1.30)
Calcium: 9.5 mg/dL (ref 8.4–10.4)
GFR, Estimated: >60 mL/min (ref >60)
Glucose, Bld: 104 mg/dL (ref 70–140)
POTASSIUM: 4.7 mmol/L (ref 3.5–5.1)
SODIUM: 135 mmol/L — AB (ref 136–145)
Total Bilirubin: 0.8 mg/dL (ref 0.2–1.2)
Total Protein: 7.5 g/dL (ref 6.4–8.3)

## 2017-07-30 LAB — CBC WITH DIFFERENTIAL (CANCER CENTER ONLY)
Basophils Absolute: 0.1 10*3/uL (ref 0.0–0.1)
Basophils Relative: 1 %
Eosinophils Absolute: 0.4 10*3/uL (ref 0.0–0.5)
Eosinophils Relative: 4 %
HEMATOCRIT: 40.7 % (ref 38.4–49.9)
HEMOGLOBIN: 13.1 g/dL (ref 13.0–17.1)
LYMPHS ABS: 2.1 10*3/uL (ref 0.9–3.3)
Lymphocytes Relative: 24 %
MCH: 29.1 pg (ref 27.2–33.4)
MCHC: 32.2 g/dL (ref 32.0–36.0)
MCV: 90.4 fL (ref 79.3–98.0)
MONO ABS: 1 10*3/uL — AB (ref 0.1–0.9)
MONOS PCT: 12 %
NEUTROS ABS: 5.2 10*3/uL (ref 1.5–6.5)
NEUTROS PCT: 59 %
Platelet Count: 247 10*3/uL (ref 140–400)
RBC: 4.5 MIL/uL (ref 4.20–5.82)
RDW: 17.3 % — AB (ref 11.0–14.6)
WBC Count: 8.7 10*3/uL (ref 4.0–10.3)

## 2017-07-30 MED ORDER — FAMOTIDINE IN NACL 20-0.9 MG/50ML-% IV SOLN
20.0000 mg | Freq: Once | INTRAVENOUS | Status: AC | PRN
  Administered 2017-07-30: 20 mg via INTRAVENOUS
  Filled 2017-07-30: qty 50

## 2017-07-30 MED ORDER — SODIUM CHLORIDE 0.9 % IV SOLN
Freq: Once | INTRAVENOUS | Status: AC
  Administered 2017-07-30: 14:00:00 via INTRAVENOUS

## 2017-07-30 MED ORDER — DIPHENHYDRAMINE HCL 50 MG/ML IJ SOLN
25.0000 mg | Freq: Once | INTRAMUSCULAR | Status: AC | PRN
  Administered 2017-07-30: 25 mg via INTRAVENOUS

## 2017-07-30 MED ORDER — SODIUM CHLORIDE 0.9 % IV SOLN
10.6000 mg/kg | Freq: Once | INTRAVENOUS | Status: AC
  Administered 2017-07-30: 980 mg via INTRAVENOUS
  Filled 2017-07-30: qty 10

## 2017-07-30 NOTE — Patient Instructions (Addendum)
[facility] Discharge Instructions for Patients Receiving Chemotherapy  Today you received the following chemotherapy agents Imfinzi.  To help prevent nausea and vomiting after your treatment, we encourage you to take your nausea medication as directed.   If you develop nausea and vomiting that is not controlled by your nausea medication, call the clinic.   BELOW ARE SYMPTOMS THAT SHOULD BE REPORTED IMMEDIATELY:  *FEVER GREATER THAN 100.5 F  *CHILLS WITH OR WITHOUT FEVER  NAUSEA AND VOMITING THAT IS NOT CONTROLLED WITH YOUR NAUSEA MEDICATION  *UNUSUAL SHORTNESS OF BREATH  *UNUSUAL BRUISING OR BLEEDING  TENDERNESS IN MOUTH AND THROAT WITH OR WITHOUT PRESENCE OF ULCERS  *URINARY PROBLEMS  *BOWEL PROBLEMS  UNUSUAL RASH Items with * indicate a potential emergency and should be followed up as soon as possible.  Feel free to call the clinic should you have any questions or concerns. The clinic phone number is [phone].  Please show the [facility] at check-in to the Emergency Department and triage nurse.    Durvalumab injection What is this medicine? DURVALUMAB (dur VAL ue mab) is a monoclonal antibody. It is used to treat urothelial cancer. This medicine may be used for other purposes; ask your health care provider or pharmacist if you have questions. COMMON BRAND NAME(S): IMFINZI What should I tell my health care provider before I take this medicine? They need to know if you have any of these conditions: -diabetes -immune system problems -infection -inflammatory bowel disease -kidney disease -liver disease -lung or breathing disease -lupus -organ transplant -stomach or intestine problems -thyroid disease -an unusual or allergic reaction to durvalumab, other medicines, foods, dyes, or preservatives -pregnant or trying to get pregnant -breast-feeding How should I use this medicine? This medicine is for infusion into a vein. It is given by a  health care professional in a hospital or clinic setting. A special MedGuide will be given to you before each treatment. Be sure to read this information carefully each time. Talk to your pediatrician regarding the use of this medicine in children. Special care may be needed. Overdosage: If you think you have taken too much of this medicine contact a poison control center or emergency room at once. NOTE: This medicine is only for you. Do not share this medicine with others. What if I miss a dose? It is important not to miss your dose. Call your doctor or health care professional if you are unable to keep an appointment. What may interact with this medicine? Interactions have not been studied. This list may not describe all possible interactions. Give your health care provider a list of all the medicines, herbs, non-prescription drugs, or dietary supplements you use. Also tell them if you smoke, drink alcohol, or use illegal drugs. Some items may interact with your medicine. What should I watch for while using this medicine? This drug may make you feel generally unwell. Continue your course of treatment even though you feel ill unless your doctor tells you to stop. You may need blood work done while you are taking this medicine. Do not become pregnant while taking this medicine or for 3 months after stopping it. Women should inform their doctor if they wish to become pregnant or think they might be pregnant. There is a potential for serious side effects to an unborn child. Talk to your health care professional or pharmacist for more information. Do not breast-feed an infant while taking this medicine or for 3 months after stopping it. What side  effects may I notice from receiving this medicine? Side effects that you should report to your doctor or health care professional as soon as possible: -allergic reactions like skin rash, itching or hives, swelling of the face, lips, or tongue -black, tarry  stools -bloody or watery diarrhea -breathing problems -change in emotions or moods -change in sex drive -changes in vision -chest pain or chest tightness -chills -confusion -cough -facial flushing -fever -headache -signs and symptoms of high blood sugar such as dizziness; dry mouth; dry skin; fruity breath; nausea; stomach pain; increased hunger or thirst; increased urination -signs and symptoms of liver injury like dark yellow or brown urine; general ill feeling or flu-like symptoms; light-colored stools; loss of appetite; nausea; right upper belly pain; unusually weak or tired; yellowing of the eyes or skin -stomach pain -trouble passing urine or change in the amount of urine -weight gain or weight loss Side effects that usually do not require medical attention (report these to your doctor or health care professional if they continue or are bothersome): -bone pain -constipation -loss of appetite -muscle pain -nausea -swelling of the ankles, feet, hands -tiredness This list may not describe all possible side effects. Call your doctor for medical advice about side effects. You may report side effects to FDA at 1-800-FDA-1088. Where should I keep my medicine? This drug is given in a hospital or clinic and will not be stored at home. NOTE: This sheet is a summary. It may not cover all possible information. If you have questions about this medicine, talk to your doctor, pharmacist, or health care provider.  2018 Elsevier/Gold Standard (2015-12-17 15:50:36)

## 2017-07-30 NOTE — Progress Notes (Signed)
Patient with flushing to face while receiving Imfinzi medication.   Imfinzi was paused, NS started per protocol.  Sandi Mealy, PA-C notified.   Patient assessed by Sandi Mealy in infusion room. Hypersensitivity protocol initiated.   VS obtained per flowsheets, medications administered per [facility].  Imfinzi restarted, patient tolerating well.

## 2017-07-31 NOTE — Progress Notes (Signed)
[facility] Progress Note   Randy [name] [id number] [birth year] 72 y.o.  Randy [name] is managed by Dr. Fanny Bien. Randy [name] presents for:  Chemotherapy:  no        Monoclonal Antibody: yes       Immunoglobulin: no       Bisphosphonate: no        Transfusion:  no     Current Therapy: Imfinzi  Randy [name] was receiving Imfinzi at the time of his reaction.  First dose of Imfinzi : yes  Assessment: Plan:    Infusion reaction, initial encounter  Randy [name] was seen in the infusion room for a suspected infusion reaction. He was receiving Imfinzi at the time of his reaction. He had received approximately one half of his infusion prior to onset of symptoms. His symptoms included: Facial flushing slight difficulty with swallowing. He was not premedicated prior to starting his infusion. Imfinzi was paused and Randy [name] was given Pepcid 20 mg IV and Benadryl 25 mg IV after onset of his symptoms. Randy [name] did  respond to intervention.  Randy [name] was able to complete his infusion without additional symptoms.  Please see After Visit Summary for patient specific instructions.  Future Appointments  Date Time Provider [place]  08/13/2017 11:00 AM [facility] LAB 2 [facility] None  08/13/2017 11:30 AM Maryanna Shape, NP [facility] None  08/13/2017 12:30 PM [facility] E16 [facility] None  08/27/2017 12:30 PM [facility] LAB 6 [facility] None  08/27/2017  1:00 PM Curcio, Kristin R, NP [facility] None  08/27/2017  2:00 PM [facility] C10 [facility] None  09/10/2017 11:00 AM [facility] LAB 1 [facility] None  09/10/2017 11:30 AM Curcio, Roselie Awkward, NP [facility] None  09/10/2017 12:30 PM [facility] E15 [facility] None    No orders of the defined types were placed in this encounter.      Subjective:   Patient ID:  Randy [name] is a 72 y.o. (DOB 05-Apr-1946) male.  Chief Complaint: No chief complaint on  file.   HPI Randy [name] was seen in the infusion room for a suspected infusion reaction. He was receiving Imfinzi at the time of his reaction. He had received approximately one half of his infusion prior to onset of symptoms. His symptoms included: Facial flushing slight difficulty in swallowing. He was not premedicated prior to starting his infusion. Imfinzi was paused and Randy [name] was given Pepcid 20 mg IV and Benadryl 25 mg IV after onset of his symptoms. Randy [name] did  respond to intervention.  Randy [name] was able to complete his infusion without additional symptoms.   Medications: I have reviewed the patient's current medications.  Allergies:  Allergies  Allergen Reactions  . Brilinta [Ticagrelor] Shortness Of Breath    Past Medical History:  Diagnosis Date  . 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 Repeat US due 2023  . Carotid arterial disease ([place])    a. S/p R CEA;  b. 05/2015 Carotid U/S: RICA 6-28%, LICA 31-51%.  . Coronary artery disease    a. 2012 MI in [place] w/ PCI  //  b. 04/2013 MV: inflat scar, no ischemia.  //  c.   NSTEMI 5/17 >> Promus DES to LCx and POBA to OM1 (ISR).   . Diabetes mellitus without complication ([place])   . Grade II diastolic dysfunction 76/16/0737  . History of cardiac catheterization 10/21/15   a. NSTEMI 5/17: oLAD  55, pLCx 90 (Promus DES), OM2 90 ISR (POBA), oRCA 25   . History of echocardiogram    a. Mild LVH, EF 50-55%, possible inferolateral HK, grade 2 diastolic dysfunction, trivial AI, MAC  . History of nuclear stress test 04/2013   a. Myoview 12/14: Intermediate risk, prior inferolateral MI, no ischemia, EF 45%  . Hx of leukocytosis   . Hyperlipidemia   . Hypertension   . Hypertensive heart disease   . Morbid obesity ([place])   . Tobacco abuse     Past Surgical History:  Procedure Laterality Date  . CARDIAC CATHETERIZATION    . CARDIAC CATHETERIZATION N/A 10/21/2015   Procedure: Left Heart Cath and Coronary  Angiography;  Surgeon: [name], MD;  Location: [facility];  Service: Cardiovascular;  Laterality: N/A;  . CARDIAC CATHETERIZATION N/A 10/21/2015   Procedure: Coronary Stent Intervention;  Surgeon: [name], MD;  Location: [facility];  Service: Cardiovascular;  Laterality: N/A;  . CARDIAC CATHETERIZATION N/A 10/21/2015   Procedure: Coronary Balloon Angioplasty;  Surgeon: [name], MD;  Location: [facility];  Service: Cardiovascular;  Laterality: N/A;  . CAROTID ENDARTERECTOMY    . CORONARY ANGIOPLASTY    . CORONARY STENT PLACEMENT    . VIDEO BRONCHOSCOPY Bilateral 04/03/2017   Procedure: VIDEO BRONCHOSCOPY WITH FLUORO;  Surgeon: [name], MD;  Location: [facility];  Service: Cardiopulmonary;  Laterality: Bilateral;    Family History  Problem Relation Age of Onset  . Other Mother        died @ 90 - complications following surgery.  . Hypertension Father        died in MVA @ 82    Social History   Socioeconomic History  . Marital status: Married    Spouse name: ardith  . Number of children: 7  . Years of education: college  . Highest education level: Not on file  Social Needs  . Financial resource strain: Not on file  . Food insecurity - worry: Not on file  . Food insecurity - inability: Not on file  . Transportation needs - medical: Not on file  . Transportation needs - non-medical: Not on file  Occupational History  . Occupation: retired  Tobacco Use  . Smoking status: Former Smoker    Packs/day: 0.75    Years: 40.00    Pack years: 30.00    Types: Cigarettes    Start date: 06/18/1972    Last attempt to quit: 10/24/2015    Years since quitting: 1.7  . Smokeless tobacco: Never Used  Substance and Sexual Activity  . Alcohol use: No    Alcohol/week: 0.0 oz  . Drug use: No  . Sexual activity: Not Currently  Other Topics Concern  . Not on file  Social History Narrative   Lives in [place] with wife.  Does not  routinely exercise.  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    Past Medical History, Surgical history, Social history, and Family history were reviewed and updated as appropriate.   Please see review of systems for further details on the patient's review from today.   Review of Systems:  Review of Systems  Constitutional: Negative for chills, diaphoresis and fever.  HENT: Positive for trouble swallowing (Mild difficulty in swallowing liquids).   Respiratory: Negative for cough, choking, chest tightness, shortness of breath and wheezing.   Cardiovascular: Negative for chest pain and palpitations.  Gastrointestinal: Negative for nausea  and vomiting.  Musculoskeletal: Negative for back pain.  Skin:       Facial flushing  Neurological: Negative for dizziness and speech difficulty.    Objective:   Physical Exam:  There were no vitals taken for this visit.  Physical Exam  Constitutional: No distress.  HENT:  Head: Normocephalic and atraumatic.  Cardiovascular: Normal rate, regular rhythm and normal heart sounds. Exam reveals no gallop and no friction rub.  No murmur heard. Pulmonary/Chest: Effort normal and breath sounds normal. No respiratory distress. He has no wheezes. He has no rales.  Neurological: He is alert.  Skin: Skin is warm and dry. He is not diaphoretic.  Facial flushing    Lab Review:     Component Value Date/Time   NA 135 (L) 07/30/2017 1316   NA 133 (L) 05/28/2017 0932   K 4.7 07/30/2017 1316   K 4.4 05/28/2017 0932   CL 98 07/30/2017 1316   CO2 26 07/30/2017 1316   CO2 24 05/28/2017 0932   GLUCOSE 104 07/30/2017 1316   GLUCOSE 197 (H) 05/28/2017 0932   BUN 11 07/30/2017 1316   BUN 9.3 05/28/2017 0932   CREATININE 0.84 07/30/2017 1316   CREATININE 0.8 05/28/2017 0932   CALCIUM 9.5 07/30/2017 1316   CALCIUM 8.5 05/28/2017 0932   PROT 7.5 07/30/2017 1316   PROT 6.6 05/28/2017 0932   ALBUMIN 3.2 (L) 07/30/2017  1316   ALBUMIN 2.9 (L) 05/28/2017 0932   AST 19 07/30/2017 1316   AST 13 05/28/2017 0932   ALT 18 07/30/2017 1316   ALT 16 05/28/2017 0932   ALKPHOS 77 07/30/2017 1316   ALKPHOS 68 05/28/2017 0932   BILITOT 0.8 07/30/2017 1316   BILITOT 0.40 05/28/2017 0932   GFRNONAA >60 07/30/2017 1316   GFRAA >60 07/30/2017 1316       Component Value Date/Time   WBC 8.7 07/30/2017 1316   WBC 2.5 (L) 06/25/2017 1011   RBC 4.50 07/30/2017 1316   HGB 12.7 (L) 06/25/2017 1011   HGB 12.1 (L) 05/28/2017 0932   HCT 40.7 07/30/2017 1316   HCT 37.5 (L) 05/28/2017 0932   PLT 247 07/30/2017 1316   PLT 241 05/28/2017 0932   PLT 354 03/26/2017 1537   MCV 90.4 07/30/2017 1316   MCV 85.8 05/28/2017 0932   MCH 29.1 07/30/2017 1316   MCHC 32.2 07/30/2017 1316   RDW 17.3 (H) 07/30/2017 1316   RDW 14.7 (H) 05/28/2017 0932   LYMPHSABS 2.1 07/30/2017 1316   LYMPHSABS 0.7 (L) 05/28/2017 0932   MONOABS 1.0 (H) 07/30/2017 1316   MONOABS 0.5 05/28/2017 0932   EOSABS 0.4 07/30/2017 1316   EOSABS 0.2 05/28/2017 0932   EOSABS 0.3 03/26/2017 1537   BASOSABS 0.1 07/30/2017 1316   BASOSABS 0.1 05/28/2017 0932   -------------------------------  Imaging from last 24 hours (if applicable):  Radiology interpretation: Dg Cervical Spine Complete  Result Date: 07/05/2017 CLINICAL DATA:  Left-sided neck pain following fall several months ago, initial encounter EXAM: CERVICAL SPINE - COMPLETE 4+ VIEW COMPARISON:  None. FINDINGS: Seven cervical segments are well visualized. Mild neural foraminal narrowing is noted on the left at C4-5 and C5-6. Facet hypertrophic changes are noted bilaterally but worse on the left than the right. The odontoid is within normal limits. No acute fractures noted. IMPRESSION: Degenerative change with neural foraminal narrowing on the left predominately related to facet hypertrophic changes. Electronically Signed   By: Inez Catalina M.D.   On: 07/05/2017 16:16   Ct Chest W Contrast  Result  Date: 07/16/2017 CLINICAL DATA:  Lung cancer.  Shortness of breath and cough. EXAM: CT CHEST WITH CONTRAST TECHNIQUE: Multidetector CT imaging of the chest was performed during intravenous contrast administration. CONTRAST:  26m ISOVUE-300 IOPAMIDOL (ISOVUE-300) INJECTION 61% COMPARISON:  PET 04/16/2017 and CT chest 03/13/2017. FINDINGS: Cardiovascular: Atherosclerotic calcification of the arterial vasculature, including coronary arteries. Heart size normal. No pericardial effusion. Mediastinum/Nodes: No pathologically enlarged mediastinal or left hilar lymph nodes. Small lymph nodes are seen in the right hilar region. No axillary adenopathy. Esophagus may be slightly thick-walled. Lungs/Pleura: Centrilobular and paraseptal emphysema. Spiculated cavitary mass in the right upper lobe extends medially into the mediastinal fat with vascular encasement. Lesion is stable to minimally decreased in size, previously measuring 4.4 x 4.8 cm. It obstructs the right upper lobe bronchus with postobstructive nodular areas of consolidation and ground-glass in the right upper lobe. Additional scattered pulmonary nodules measure 5 mm or less in size, stable. Trace right pleural effusion. Airway is otherwise unremarkable. Upper Abdomen: Visualized portions of the liver and gallbladder are unremarkable. Slight nodular thickening of the body of the right adrenal gland. Visualized portions of the left adrenal gland, right kidney, spleen, pancreas, stomach and bowel are grossly unremarkable. No upper abdominal adenopathy. Musculoskeletal: No worrisome lytic or sclerotic lesions. Degenerative changes are seen in the spine. IMPRESSION: 1. Spiculated right upper lobe mass invades the adjacent mediastinum and obstructs the right upper lobe bronchus. It is stable to minimally decreased in size. 2. Trace right pleural effusion. 3. Aortic atherosclerosis (ICD10-170.0). Coronary artery calcification. 4.  Emphysema (ICD10-J43.9). Electronically  Signed   By: MLorin PicketM.D.   On: 07/16/2017 15:26

## 2017-08-01 ENCOUNTER — Other Ambulatory Visit: Payer: Medicare Other

## 2017-08-01 ENCOUNTER — Ambulatory Visit: Payer: Medicare Other

## 2017-08-01 LAB — TSH: TSH: 2.848 u[IU]/mL (ref 0.320–4.118)

## 2017-08-11 ENCOUNTER — Other Ambulatory Visit: Payer: Self-pay | Admitting: Physician Assistant

## 2017-08-13 ENCOUNTER — Inpatient Hospital Stay: Payer: Medicare Other

## 2017-08-13 ENCOUNTER — Encounter: Payer: Self-pay | Admitting: Oncology

## 2017-08-13 ENCOUNTER — Inpatient Hospital Stay (HOSPITAL_BASED_OUTPATIENT_CLINIC_OR_DEPARTMENT_OTHER): Payer: Medicare Other | Admitting: Oncology

## 2017-08-13 VITALS — BP 156/88 | HR 76 | Temp 98.4°F | Resp 20 | Ht 71.0 in | Wt 201.4 lb

## 2017-08-13 DIAGNOSIS — C3491 Malignant neoplasm of unspecified part of right bronchus or lung: Secondary | ICD-10-CM

## 2017-08-13 DIAGNOSIS — C3411 Malignant neoplasm of upper lobe, right bronchus or lung: Secondary | ICD-10-CM

## 2017-08-13 DIAGNOSIS — Z5112 Encounter for antineoplastic immunotherapy: Secondary | ICD-10-CM

## 2017-08-13 DIAGNOSIS — R05 Cough: Secondary | ICD-10-CM

## 2017-08-13 DIAGNOSIS — Z79899 Other long term (current) drug therapy: Secondary | ICD-10-CM | POA: Diagnosis not present

## 2017-08-13 LAB — CMP (CANCER CENTER ONLY)
ALK PHOS: 79 U/L (ref 40–150)
ALT: 19 U/L (ref 0–55)
AST: 18 U/L (ref 5–34)
Albumin: 3.5 g/dL (ref 3.5–5.0)
Anion gap: 8 (ref 3–11)
BILIRUBIN TOTAL: 0.6 mg/dL (ref 0.2–1.2)
BUN: 10 mg/dL (ref 7–26)
CALCIUM: 8.7 mg/dL (ref 8.4–10.4)
CO2: 28 mmol/L (ref 22–29)
CREATININE: 0.87 mg/dL (ref 0.70–1.30)
Chloride: 102 mmol/L (ref 98–109)
Glucose, Bld: 117 mg/dL (ref 70–140)
Potassium: 5.7 mmol/L — ABNORMAL HIGH (ref 3.5–5.1)
Sodium: 138 mmol/L (ref 136–145)
TOTAL PROTEIN: 7.9 g/dL (ref 6.4–8.3)

## 2017-08-13 LAB — CBC WITH DIFFERENTIAL (CANCER CENTER ONLY)
BASOS ABS: 0.1 10*3/uL (ref 0.0–0.1)
BASOS PCT: 1 %
EOS ABS: 0.3 10*3/uL (ref 0.0–0.5)
EOS PCT: 4 %
HCT: 42.6 % (ref 38.4–49.9)
HEMOGLOBIN: 13.8 g/dL (ref 13.0–17.1)
LYMPHS ABS: 2 10*3/uL (ref 0.9–3.3)
Lymphocytes Relative: 24 %
MCH: 29.1 pg (ref 27.2–33.4)
MCHC: 32.4 g/dL (ref 32.0–36.0)
MCV: 89.9 fL (ref 79.3–98.0)
Monocytes Absolute: 1 10*3/uL — ABNORMAL HIGH (ref 0.1–0.9)
Monocytes Relative: 12 %
Neutro Abs: 4.9 10*3/uL (ref 1.5–6.5)
Neutrophils Relative %: 59 %
PLATELETS: 241 10*3/uL (ref 140–400)
RBC: 4.74 MIL/uL (ref 4.20–5.82)
RDW: 18 % — ABNORMAL HIGH (ref 11.0–14.6)
WBC: 8.3 10*3/uL (ref 4.0–10.3)

## 2017-08-13 MED ORDER — DURVALUMAB 500 MG/10ML IV SOLN
10.6000 mg/kg | Freq: Once | INTRAVENOUS | Status: AC
  Administered 2017-08-13: 980 mg via INTRAVENOUS
  Filled 2017-08-13: qty 9.6

## 2017-08-13 MED ORDER — SODIUM CHLORIDE 0.9 % IV SOLN
Freq: Once | INTRAVENOUS | Status: AC
  Administered 2017-08-13: 14:00:00 via INTRAVENOUS

## 2017-08-13 MED ORDER — HYDROCOD POLST-CPM POLST ER 10-8 MG/5ML PO SUER: 5.0000 mL | Freq: Two times a day (BID) | ORAL | 0 refills | Status: DC | PRN

## 2017-08-13 NOTE — Assessment & Plan Note (Addendum)
This is a very pleasant 72 year old white male with a stage IIIb non-small cell lung cancer, squamous cell carcinoma. The patient received a course of concurrent chemoradiation with weekly carboplatin and paclitaxel.Status post 6 cycles.  He tolerated the treatment fairly well with no significant adverse effects. He is now on consolidation treatment with immunotherapy with Imfinzi (Durvalumab) 10 MG/KG every 2 weeks.  Status post 1 cycle which she tolerated fairly well.  He had a questionable reaction with redness in his face, but no chest pain or shortness of breath.  The patient was seen with Dr. [name].  Recommend he proceed with cycle 2 of his Imfinzi as scheduled today.  We will not add any additional premedications at this time.  He will be watched closely and if any evidence of a reaction occurs, we may consider changing premedications in the future.  The patient will follow-up in 2 weeks for evaluation prior to cycle #3.  For his cough, he may continue Tussionex.  A refill was given today.    He was advised to call immediately if he has any concerning symptoms in the interval. The patient voices understanding of current disease status and treatment options and is in agreement with the current care plan. All questions were answered. The patient knows to call the clinic with any problems, questions or concerns. We can certainly see the patient much sooner if necessary.

## 2017-08-13 NOTE — Progress Notes (Signed)
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  PRIOR THERAPY: A course of concurrent chemoradiation with weekly carboplatin for AUC of 2 and paclitaxel 45 mg/M2.Status post6cycles.  CURRENT THERAPY: Consolidation immunotherapy with Imfinzi given every 2 weeks.  First dose 07/30/2017.  Status post 1 cycle.  INTERVAL HISTORY: Randy [name] 72 y.o. male returns for routine follow-up visit accompanied by his wife.  The patient is feeling fine today has no specific complaints except for ongoing nonproductive cough.  He tried Delsym which was not helpful.  He is used Tussionex in the past which he has some at home and this did help.  The patient denies fevers and chills.  Denies chest pain, shortness of breath, hemoptysis.  Denies nausea, vomiting, constipation, diarrhea.  Denies skin rashes.  The patient tolerated his first cycle of Imfinzi fairly well.  During his infusion, he did develop redness to his face, but no shortness of breath or chest pain.  He was given Benadryl and Pepcid during the infusion and his facial redness resolved.  The patient is here for evaluation prior to cycle 2 of his Imfinzi.  MEDICAL HISTORY: Past Medical History:  Diagnosis Date  . 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 Repeat US due 2023  . Carotid arterial disease ([place])    a. S/p R CEA;  b. 05/2015 Carotid U/S: RICA 6-04%, LICA 54-09%.  . Coronary artery disease    a. 2012 MI in [place] w/ PCI  //  b. 04/2013 MV: inflat scar, no ischemia.  //  c.   NSTEMI 5/17 >> Promus DES to LCx and POBA to OM1 (ISR).   . Diabetes mellitus  without complication ([place])   . Grade II diastolic dysfunction 81/19/1478  . History of cardiac catheterization 10/21/15   a. NSTEMI 5/17: oLAD 40, pLCx 90 (Promus DES), OM2 90 ISR (POBA), oRCA 25   . History of echocardiogram    a. Mild LVH, EF 50-55%, possible inferolateral HK, grade 2 diastolic dysfunction, trivial AI, MAC  . History of nuclear stress test 04/2013   a. Myoview 12/14: Intermediate risk, prior inferolateral MI, no ischemia, EF 45%  . Hx of leukocytosis   . Hyperlipidemia   . Hypertension   . Hypertensive heart disease   . Morbid obesity ([place])   . Tobacco abuse     ALLERGIES:  is allergic to brilinta [ticagrelor].  MEDICATIONS:  Current Outpatient Medications  Medication Sig Dispense Refill  . aspirin EC 81 MG tablet Take 81 mg by mouth daily.    [name] atorvastatin (LIPITOR) 40 MG tablet TAKE 1 TABLET BY MOUTH EVERY DAY 90 tablet 3  . chlorpheniramine-HYDROcodone (TUSSIONEX) 10-8 MG/5ML SUER Take 5 mLs by mouth every 12 (twelve) hours as needed for cough. 120 mL 0  . clopidogrel (PLAVIX) 75 MG tablet Take 1 tablet (75 mg total) by mouth daily. 90 tablet 3  . cyclobenzaprine (FLEXERIL) 5 MG tablet Take 1-2 tablets (5-10 mg total) by mouth 3 times/day as needed-between meals & bedtime for muscle spasms. 30 tablet 1  . metFORMIN (GLUCOPHAGE) 500 MG tablet Take 0.5 tablets (250 mg total) by mouth 2 (two) times daily with a meal. Need A1C  checked 30 tablet 0  . metoprolol succinate (TOPROL-XL) 25 MG 24 hr tablet Take 1 tablet (25 mg total) by mouth 2 (two) times daily. 180 tablet 3  . prochlorperazine (COMPAZINE) 10 MG tablet Take 1 tablet (10 mg total) by mouth every 6 (six) hours as needed for nausea or vomiting. 30 tablet 2  . sucralfate (CARAFATE) 1 g tablet Take 1 g by mouth 2 (two) times daily.    . VENTOLIN HFA 108 (90 Base) MCG/ACT inhaler Inhale 1 puff every 4 (four) hours as needed into the lungs for wheezing or shortness of breath. 1 Inhaler 5   No current  facility-administered medications for this visit.     SURGICAL HISTORY:  Past Surgical History:  Procedure Laterality Date  . CARDIAC CATHETERIZATION    . CARDIAC CATHETERIZATION N/A 10/21/2015   Procedure: Left Heart Cath and Coronary Angiography;  Surgeon: [name], MD;  Location: [place] [facility];  Service: Cardiovascular;  Laterality: N/A;  . CARDIAC CATHETERIZATION N/A 10/21/2015   Procedure: Coronary Stent Intervention;  Surgeon: [name], MD;  Location: [place] [facility];  Service: Cardiovascular;  Laterality: N/A;  . CARDIAC CATHETERIZATION N/A 10/21/2015   Procedure: Coronary Balloon Angioplasty;  Surgeon: [name], MD;  Location: [place] [facility];  Service: Cardiovascular;  Laterality: N/A;  . CAROTID ENDARTERECTOMY    . CORONARY ANGIOPLASTY    . CORONARY STENT PLACEMENT    . VIDEO BRONCHOSCOPY Bilateral 04/03/2017   Procedure: VIDEO BRONCHOSCOPY WITH FLUORO;  Surgeon: [name], MD;  Location: [facility];  Service: Cardiopulmonary;  Laterality: Bilateral;    REVIEW OF SYSTEMS:   Review of Systems  Constitutional: Negative for appetite change, chills, fatigue, fever and unexpected weight change.  HENT:   Negative for mouth sores, nosebleeds, sore throat and trouble swallowing.   Eyes: Negative for eye problems and icterus.  Respiratory: Negative for hemoptysis, shortness of breath and wheezing.  Positive for cough.  Cardiovascular: Negative for chest pain and leg swelling.  Gastrointestinal: Negative for abdominal pain, constipation, diarrhea, nausea and vomiting.  Genitourinary: Negative for bladder incontinence, difficulty urinating, dysuria, frequency and hematuria.   Musculoskeletal: Negative for back pain, gait problem, neck pain and neck stiffness.  Skin: Negative for itching and rash.  Neurological: Negative for dizziness, extremity weakness, gait problem, headaches, light-headedness and seizures.  Hematological: Negative  for adenopathy. Does not bruise/bleed easily.  Psychiatric/Behavioral: Negative for confusion, depression and sleep disturbance. The patient is not nervous/anxious.     PHYSICAL EXAMINATION:  Blood pressure (!) 156/88, pulse 76, temperature 98.4 F (36.9 C), temperature source Oral, resp. rate 20, height _0  (1.803 m), weight 201 lb 6.4 oz (91.4 kg), SpO2 98 %.  ECOG PERFORMANCE STATUS: 1 - Symptomatic but completely ambulatory  Physical Exam  Constitutional: Oriented to person, place, and time and well-developed, well-nourished, and in no distress. No distress.  HENT:  Head: Normocephalic and atraumatic.  Mouth/Throat: Oropharynx is clear and moist. No oropharyngeal exudate.  Eyes: Conjunctivae are normal. Right eye exhibits no discharge. Left eye exhibits no discharge. No scleral icterus.  Neck: Normal range of motion. Neck supple.  Cardiovascular: Normal rate, regular rhythm, normal heart sounds and intact distal pulses.   Pulmonary/Chest: Effort normal and breath sounds normal. No respiratory distress. No wheezes. No rales.  Abdominal: Soft. Bowel sounds are normal. Exhibits no distension and no mass. There is no tenderness.  Musculoskeletal: Normal range of motion. Exhibits no edema.  Lymphadenopathy:    No  cervical adenopathy.  Neurological: Alert and oriented to person, place, and time. Exhibits normal muscle tone. Gait normal. Coordination normal.  Skin: Skin is warm and dry. No rash noted. Not diaphoretic. No erythema. No pallor.  Psychiatric: Mood, memory and judgment normal.  Vitals reviewed.  LABORATORY DATA: Lab Results  Component Value Date   WBC 8.3 08/13/2017   HGB 12.7 (L) 06/25/2017   HCT 42.6 08/13/2017   MCV 89.9 08/13/2017   PLT 241 08/13/2017      Chemistry      Component Value Date/Time   NA 138 08/13/2017 1107   NA 133 (L) 05/28/2017 0932   K 5.7 (H) 08/13/2017 1107   K 4.4 05/28/2017 0932   CL 102 08/13/2017 1107   CO2 28 08/13/2017 1107    CO2 24 05/28/2017 0932   BUN 10 08/13/2017 1107   BUN 9.3 05/28/2017 0932   CREATININE 0.87 08/13/2017 1107   CREATININE 0.8 05/28/2017 0932      Component Value Date/Time   CALCIUM 8.7 08/13/2017 1107   CALCIUM 8.5 05/28/2017 0932   ALKPHOS 79 08/13/2017 1107   ALKPHOS 68 05/28/2017 0932   AST 18 08/13/2017 1107   AST 13 05/28/2017 0932   ALT 19 08/13/2017 1107   ALT 16 05/28/2017 0932   BILITOT 0.6 08/13/2017 1107   BILITOT 0.40 05/28/2017 0932       RADIOGRAPHIC STUDIES:  Ct Chest W Contrast  Result Date: 07/16/2017 CLINICAL DATA:  Lung cancer.  Shortness of breath and cough. EXAM: CT CHEST WITH CONTRAST TECHNIQUE: Multidetector CT imaging of the chest was performed during intravenous contrast administration. CONTRAST:  57m ISOVUE-300 IOPAMIDOL (ISOVUE-300) INJECTION 61% COMPARISON:  PET 04/16/2017 and CT chest 03/13/2017. FINDINGS: Cardiovascular: Atherosclerotic calcification of the arterial vasculature, including coronary arteries. Heart size normal. No pericardial effusion. Mediastinum/Nodes: No pathologically enlarged mediastinal or left hilar lymph nodes. Small lymph nodes are seen in the right hilar region. No axillary adenopathy. Esophagus may be slightly thick-walled. Lungs/Pleura: Centrilobular and paraseptal emphysema. Spiculated cavitary mass in the right upper lobe extends medially into the mediastinal fat with vascular encasement. Lesion is stable to minimally decreased in size, previously measuring 4.4 x 4.8 cm. It obstructs the right upper lobe bronchus with postobstructive nodular areas of consolidation and ground-glass in the right upper lobe. Additional scattered pulmonary nodules measure 5 mm or less in size, stable. Trace right pleural effusion. Airway is otherwise unremarkable. Upper Abdomen: Visualized portions of the liver and gallbladder are unremarkable. Slight nodular thickening of the body of the right adrenal gland. Visualized portions of the left adrenal  gland, right kidney, spleen, pancreas, stomach and bowel are grossly unremarkable. No upper abdominal adenopathy. Musculoskeletal: No worrisome lytic or sclerotic lesions. Degenerative changes are seen in the spine. IMPRESSION: 1. Spiculated right upper lobe mass invades the adjacent mediastinum and obstructs the right upper lobe bronchus. It is stable to minimally decreased in size. 2. Trace right pleural effusion. 3. Aortic atherosclerosis (ICD10-170.0). Coronary artery calcification. 4.  Emphysema (ICD10-J43.9). Electronically Signed   By: MLorin PicketM.D.   On: 07/16/2017 15:26     ASSESSMENT/PLAN:  Stage III squamous cell carcinoma of right lung ([facility] This is a very pleasant 72year old white male with a stage IIIb non-small cell lung cancer, squamous cell carcinoma. The patient received a course of concurrent chemoradiation with weekly carboplatin and paclitaxel.Status post 6 cycles.  He tolerated the treatment fairly well with no significant adverse effects. He is now on consolidation treatment with  immunotherapy with Imfinzi (Durvalumab) 10 MG/KG every 2 weeks.  Status post 1 cycle which she tolerated fairly well.  He had a questionable reaction with redness in his face, but no chest pain or shortness of breath.  The patient was seen with Dr. [name].  Recommend he proceed with cycle 2 of his Imfinzi as scheduled today.  We will not add any additional premedications at this time.  He will be watched closely and if any evidence of a reaction occurs, we may consider changing premedications in the future.  The patient will follow-up in 2 weeks for evaluation prior to cycle #3.  For his cough, he may continue Tussionex.  A refill was given today.    He was advised to call immediately if he has any concerning symptoms in the interval. The patient voices understanding of current disease status and treatment options and is in agreement with the current care plan. All questions were  answered. The patient knows to call the clinic with any problems, questions or concerns. We can certainly see the patient much sooner if necessary.  No orders of the defined types were placed in this encounter.  Mikey Bussing, DNP, AGPCNP-BC, AOCNP 08/13/17  ADDENDUM: Hematology/Oncology Attending: I had a face-to-face encounter with the patient.  I recommended his care plan.  This is a very pleasant 72 years old white male with a stage IIIb non-small cell lung cancer status post induction concurrent chemoradiation with weekly carboplatin and paclitaxel with partial response.  The patient is currently undergoing treatment with consolidation immunotherapy with Imfinzi (Durvalumab) status post 1 cycle.  He tolerated the first cycle well except for questionable infusion reaction with redness of his face but no other associated symptoms.  He was giving Pepcid and Benadryl and felt better and continued his treatment last time.  I recommended for the patient to proceed with cycle #2 today as a scheduled. He will come back for follow-up visit in 2 weeks for evaluation before the next dose of his treatment. The patient was advised to call immediately if he has any concerning symptoms in the interval.  Disclaimer: This note was dictated with voice recognition software. Similar sounding words can inadvertently be transcribed and may be missed upon review. Eilleen Kempf, MD 08/14/17

## 2017-08-13 NOTE — Patient Instructions (Signed)
[facility] Discharge Instructions for Patients Receiving Chemotherapy  Today you received the following chemotherapy agents: Imfinzi.  To help prevent nausea and vomiting after your treatment, we encourage you to take your nausea medication as directed.   If you develop nausea and vomiting that is not controlled by your nausea medication, call the clinic.   BELOW ARE SYMPTOMS THAT SHOULD BE REPORTED IMMEDIATELY:  *FEVER GREATER THAN 100.5 F  *CHILLS WITH OR WITHOUT FEVER  NAUSEA AND VOMITING THAT IS NOT CONTROLLED WITH YOUR NAUSEA MEDICATION  *UNUSUAL SHORTNESS OF BREATH  *UNUSUAL BRUISING OR BLEEDING  TENDERNESS IN MOUTH AND THROAT WITH OR WITHOUT PRESENCE OF ULCERS  *URINARY PROBLEMS  *BOWEL PROBLEMS  UNUSUAL RASH Items with * indicate a potential emergency and should be followed up as soon as possible.  Feel free to call the clinic should you have any questions or concerns. The clinic phone number is [phone].  Please show the CHEMO ALERT CARD at check-in to the Emergency Department and triage nurse.   

## 2017-08-14 ENCOUNTER — Telehealth: Payer: Self-pay | Admitting: Oncology

## 2017-08-14 NOTE — Telephone Encounter (Signed)
Scheduled appt per 3/18 los - patient to get an updated schedule next visit.

## 2017-08-22 ENCOUNTER — Encounter: Payer: Self-pay | Admitting: Internal Medicine

## 2017-08-25 ENCOUNTER — Other Ambulatory Visit: Payer: Self-pay | Admitting: Internal Medicine

## 2017-08-25 ENCOUNTER — Telehealth: Payer: Self-pay | Admitting: Physician Assistant

## 2017-08-25 MED ORDER — METOPROLOL SUCCINATE ER 25 MG PO TB24: 25.0000 mg | ORAL_TABLET | Freq: Three times a day (TID) | ORAL | 3 refills | Status: DC

## 2017-08-25 NOTE — Telephone Encounter (Signed)
Clarified with [place] that patient should take 50 mg Toprol in the AM and 25 mg Toprol in the evening. This was confirmed by Dr. Alan Ripper last note 05/2017.

## 2017-08-27 ENCOUNTER — Inpatient Hospital Stay (HOSPITAL_BASED_OUTPATIENT_CLINIC_OR_DEPARTMENT_OTHER): Payer: Medicare Other | Admitting: Oncology

## 2017-08-27 ENCOUNTER — Inpatient Hospital Stay: Payer: Medicare Other | Attending: Internal Medicine

## 2017-08-27 ENCOUNTER — Telehealth: Payer: Self-pay | Admitting: Oncology

## 2017-08-27 ENCOUNTER — Inpatient Hospital Stay: Payer: Medicare Other

## 2017-08-27 ENCOUNTER — Encounter: Payer: Self-pay | Admitting: Oncology

## 2017-08-27 VITALS — BP 145/51 | HR 71 | Temp 97.7°F | Resp 20 | Wt 206.3 lb

## 2017-08-27 DIAGNOSIS — C3411 Malignant neoplasm of upper lobe, right bronchus or lung: Secondary | ICD-10-CM

## 2017-08-27 DIAGNOSIS — Z5112 Encounter for antineoplastic immunotherapy: Secondary | ICD-10-CM | POA: Insufficient documentation

## 2017-08-27 DIAGNOSIS — R5383 Other fatigue: Secondary | ICD-10-CM

## 2017-08-27 DIAGNOSIS — C3491 Malignant neoplasm of unspecified part of right bronchus or lung: Secondary | ICD-10-CM

## 2017-08-27 DIAGNOSIS — Z79899 Other long term (current) drug therapy: Secondary | ICD-10-CM | POA: Diagnosis not present

## 2017-08-27 DIAGNOSIS — R05 Cough: Secondary | ICD-10-CM | POA: Diagnosis not present

## 2017-08-27 LAB — CBC WITH DIFFERENTIAL (CANCER CENTER ONLY)
BASOS ABS: 0.1 10*3/uL (ref 0.0–0.1)
BASOS PCT: 1 %
EOS ABS: 0.4 10*3/uL (ref 0.0–0.5)
Eosinophils Relative: 4 %
HCT: 43.7 % (ref 38.4–49.9)
Hemoglobin: 14.1 g/dL (ref 13.0–17.1)
Lymphocytes Relative: 20 %
Lymphs Abs: 1.8 10*3/uL (ref 0.9–3.3)
MCH: 29 pg (ref 27.2–33.4)
MCHC: 32.2 g/dL (ref 32.0–36.0)
MCV: 90 fL (ref 79.3–98.0)
MONO ABS: 0.8 10*3/uL (ref 0.1–0.9)
Monocytes Relative: 9 %
Neutro Abs: 5.9 10*3/uL (ref 1.5–6.5)
Neutrophils Relative %: 66 %
Platelet Count: 232 10*3/uL (ref 140–400)
RBC: 4.85 MIL/uL (ref 4.20–5.82)
RDW: 16.8 % — AB (ref 11.0–14.6)
WBC Count: 9 10*3/uL (ref 4.0–10.3)

## 2017-08-27 LAB — CMP (CANCER CENTER ONLY)
ALBUMIN: 3.4 g/dL — AB (ref 3.5–5.0)
ALT: 20 U/L (ref 0–55)
ANION GAP: 9 (ref 3–11)
AST: 19 U/L (ref 5–34)
Alkaline Phosphatase: 85 U/L (ref 40–150)
BUN: 12 mg/dL (ref 7–26)
CO2: 26 mmol/L (ref 22–29)
Calcium: 9.7 mg/dL (ref 8.4–10.4)
Chloride: 103 mmol/L (ref 98–109)
Creatinine: 0.77 mg/dL (ref 0.70–1.30)
GFR, Est AFR Am: >60 mL/min (ref >60)
GFR, Estimated: >60 mL/min (ref >60)
GLUCOSE: 101 mg/dL (ref 70–140)
POTASSIUM: 4.2 mmol/L (ref 3.5–5.1)
Sodium: 138 mmol/L (ref 136–145)
Total Bilirubin: 0.5 mg/dL (ref 0.2–1.2)
Total Protein: 7.8 g/dL (ref 6.4–8.3)

## 2017-08-27 LAB — TSH: TSH: 2.39 u[IU]/mL (ref 0.320–4.118)

## 2017-08-27 MED ORDER — HYDROCOD POLST-CPM POLST ER 10-8 MG/5ML PO SUER: 5.0000 mL | Freq: Two times a day (BID) | ORAL | 0 refills | Status: DC | PRN

## 2017-08-27 MED ORDER — SODIUM CHLORIDE 0.9 % IV SOLN
980.0000 mg | Freq: Once | INTRAVENOUS | Status: AC
  Administered 2017-08-27: 980 mg via INTRAVENOUS
  Filled 2017-08-27: qty 10

## 2017-08-27 MED ORDER — SODIUM CHLORIDE 0.9 % IV SOLN
Freq: Once | INTRAVENOUS | Status: AC
  Administered 2017-08-27: 15:00:00 via INTRAVENOUS

## 2017-08-27 NOTE — Progress Notes (Signed)
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  PRIOR THERAPY: A course of concurrent chemoradiation with weekly carboplatin for AUC of 2 and paclitaxel 45 mg/M2.Status post6cycles.  CURRENT THERAPY: Consolidation immunotherapy with Imfinzi given every 2 weeks. First dose 07/30/2017.  Status post 2 cycles.  INTERVAL HISTORY: Randy [name] 72 y.o. male returns for routine follow-up visit by himself.  The patient is feeling fine today and has no specific complaints except for ongoing nonproductive cough.  The patient denies fevers and chills.  Denies chest pain, shortness of breath, hemoptysis.  Denies nausea, vomiting, constipation, diarrhea.  Denies skin rashes.  Patient continued to tolerate his treatment with Imfinzi fairly well.  The patient is here for evaluation prior to cycle #3.  MEDICAL HISTORY: Past Medical History:  Diagnosis Date  . 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 Repeat US due 2023  . Carotid arterial disease ([place])    a. S/p R CEA;  b. 05/2015 Carotid U/S: RICA 0-97%, LICA 35-32%.  . Coronary artery disease    a. 2012 MI in [place] w/ PCI  //  b. 04/2013 MV: inflat scar, no ischemia.  //  c.   NSTEMI 5/17 >> Promus DES to LCx and POBA to OM1 (ISR).   . Diabetes mellitus without complication ([place])   . Grade II diastolic dysfunction 99/24/2683  . History of cardiac catheterization 10/21/15   a. NSTEMI 5/17: oLAD 40, pLCx 90 (Promus DES), OM2 90 ISR (POBA), oRCA 25   . History of echocardiogram    a. Mild LVH, EF 50-55%, possible inferolateral HK, grade 2 diastolic dysfunction,  trivial AI, MAC  . History of nuclear stress test 04/2013   a. Myoview 12/14: Intermediate risk, prior inferolateral MI, no ischemia, EF 45%  . Hx of leukocytosis   . Hyperlipidemia   . Hypertension   . Hypertensive heart disease   . Morbid obesity ([place])   . Tobacco abuse     ALLERGIES:  is allergic to brilinta [ticagrelor].  MEDICATIONS:  Current Outpatient Medications  Medication Sig Dispense Refill  . aspirin EC 81 MG tablet Take 81 mg by mouth daily.    [name] atorvastatin (LIPITOR) 40 MG tablet TAKE 1 TABLET BY MOUTH EVERY DAY 90 tablet 3  . chlorpheniramine-HYDROcodone (TUSSIONEX) 10-8 MG/5ML SUER Take 5 mLs by mouth every 12 (twelve) hours as needed for cough. 120 mL 0  . clopidogrel (PLAVIX) 75 MG tablet Take 1 tablet (75 mg total) by mouth daily. 90 tablet 3  . metFORMIN (GLUCOPHAGE) 500 MG tablet Take 0.5 tablets (250 mg total) by mouth 2 (two) times daily with a meal. Need A1C checked 30 tablet 0  . metoprolol succinate (TOPROL-XL) 25 MG 24 hr tablet Take 1 tablet (25 mg total) by mouth 3 (three) times daily. Take 2 in morning, 1 in PM 270 tablet 3  . VENTOLIN HFA 108 (90 Base) MCG/ACT inhaler Inhale 1 puff every 4 (four) hours as needed into the lungs for wheezing or shortness of breath. 1 Inhaler 5  . cyclobenzaprine (FLEXERIL) 5 MG tablet Take 1-2 tablets (5-10 mg total) by mouth 3 times/day as needed-between meals & bedtime for muscle spasms. (Patient not  taking: Reported on 08/27/2017) 30 tablet 1  . prochlorperazine (COMPAZINE) 10 MG tablet Take 1 tablet (10 mg total) by mouth every 6 (six) hours as needed for nausea or vomiting. (Patient not taking: Reported on 08/27/2017) 30 tablet 2  . sucralfate (CARAFATE) 1 g tablet Take 1 g by mouth 2 (two) times daily.     No current facility-administered medications for this visit.     SURGICAL HISTORY:  Past Surgical History:  Procedure Laterality Date  . CARDIAC CATHETERIZATION    . CARDIAC CATHETERIZATION N/A 10/21/2015    Procedure: Left Heart Cath and Coronary Angiography;  Surgeon: [name], MD;  Location: [place] [facility];  Service: Cardiovascular;  Laterality: N/A;  . CARDIAC CATHETERIZATION N/A 10/21/2015   Procedure: Coronary Stent Intervention;  Surgeon: [name], MD;  Location: [place] [facility];  Service: Cardiovascular;  Laterality: N/A;  . CARDIAC CATHETERIZATION N/A 10/21/2015   Procedure: Coronary Balloon Angioplasty;  Surgeon: [name], MD;  Location: [place] [facility];  Service: Cardiovascular;  Laterality: N/A;  . CAROTID ENDARTERECTOMY    . CORONARY ANGIOPLASTY    . CORONARY STENT PLACEMENT    . VIDEO BRONCHOSCOPY Bilateral 04/03/2017   Procedure: VIDEO BRONCHOSCOPY WITH FLUORO;  Surgeon: [name], MD;  Location: [facility];  Service: Cardiopulmonary;  Laterality: Bilateral;    REVIEW OF SYSTEMS:   Review of Systems  Constitutional: Negative for appetite change, chills, fatigue, fever and unexpected weight change.  HENT:   Negative for mouth sores, nosebleeds, sore throat and trouble swallowing.   Eyes: Negative for eye problems and icterus.  Respiratory: Negative for hemoptysis, shortness of breath and wheezing.  Positive for cough. Cardiovascular: Negative for chest pain and leg swelling.  Gastrointestinal: Negative for abdominal pain, constipation, diarrhea, nausea and vomiting.  Genitourinary: Negative for bladder incontinence, difficulty urinating, dysuria, frequency and hematuria.   Musculoskeletal: Negative for back pain, gait problem, neck pain and neck stiffness.  Skin: Negative for itching and rash.  Neurological: Negative for dizziness, extremity weakness, gait problem, headaches, light-headedness and seizures.  Hematological: Negative for adenopathy. Does not bruise/bleed easily.  Psychiatric/Behavioral: Negative for confusion, depression and sleep disturbance. The patient is not nervous/anxious.     PHYSICAL EXAMINATION:  Blood  pressure (!) 145/51, pulse 71, temperature 97.7 F (36.5 C), temperature source Oral, resp. rate 20, weight 206 lb 4.8 oz (93.6 kg), SpO2 100 %.  ECOG PERFORMANCE STATUS: 1 - Symptomatic but completely ambulatory  Physical Exam  Constitutional: Oriented to person, place, and time and well-developed, well-nourished, and in no distress. No distress.  HENT:  Head: Normocephalic and atraumatic.  Mouth/Throat: Oropharynx is clear and moist. No oropharyngeal exudate.  Eyes: Conjunctivae are normal. Right eye exhibits no discharge. Left eye exhibits no discharge. No scleral icterus.  Neck: Normal range of motion. Neck supple.  Cardiovascular: Normal rate, regular rhythm, normal heart sounds and intact distal pulses.   Pulmonary/Chest: Effort normal and breath sounds normal. No respiratory distress. No wheezes. No rales.  Abdominal: Soft. Bowel sounds are normal. Exhibits no distension and no mass. There is no tenderness.  Musculoskeletal: Normal range of motion. Exhibits no edema.  Lymphadenopathy:    No cervical adenopathy.  Neurological: Alert and oriented to person, place, and time. Exhibits normal muscle tone. Gait normal. Coordination normal.  Skin: Skin is warm and dry. No rash noted. Not diaphoretic. No erythema. No pallor.  Psychiatric: Mood, memory and judgment normal.  Vitals reviewed.  LABORATORY DATA: Lab Results  Component Value Date   WBC 9.0 08/27/2017   HGB 12.7 (L) 06/25/2017   HCT 43.7 08/27/2017   MCV 90.0 08/27/2017   PLT 232 08/27/2017      Chemistry      Component Value Date/Time   NA 138 08/27/2017 1224   NA 133 (L) 05/28/2017 0932   K 4.2 08/27/2017 1224   K 4.4 05/28/2017 0932   CL 103 08/27/2017 1224   CO2 26 08/27/2017 1224   CO2 24 05/28/2017 0932   BUN 12 08/27/2017 1224   BUN 9.3 05/28/2017 0932   CREATININE 0.77 08/27/2017 1224   CREATININE 0.8 05/28/2017 0932      Component Value Date/Time   CALCIUM 9.7 08/27/2017 1224   CALCIUM 8.5  05/28/2017 0932   ALKPHOS 85 08/27/2017 1224   ALKPHOS 68 05/28/2017 0932   AST 19 08/27/2017 1224   AST 13 05/28/2017 0932   ALT 20 08/27/2017 1224   ALT 16 05/28/2017 0932   BILITOT 0.5 08/27/2017 1224   BILITOT 0.40 05/28/2017 0932       RADIOGRAPHIC STUDIES:  No results found.   ASSESSMENT/PLAN:  Stage III squamous cell carcinoma of right lung [facility]) This is a very pleasant 72 year old white male with a stage IIIb non-small cell lung cancer, squamous cell carcinoma. The patient received acourse of concurrent chemoradiation with weekly carboplatin and paclitaxel.Status post 6 cycles.He tolerated thetreatment fairly well with no significant adverse effects. He is now on consolidation treatment with immunotherapy with Imfinzi (Durvalumab) 10 MG/KG every 2 weeks.  Status post 2 cycles which he is tolerating fairly well.    Recommend that he proceed with cycle 3 of his treatment as scheduled today. He will follow-up in 2 weeks for evaluation prior to cycle #4.  For his cough, he may continue Tussionex.  A refill was given today.    He was advised to call immediately if he has any concerning symptoms in the interval. The patient voices understanding of current disease status and treatment options and is in agreement with the current care plan. All questions were answered. The patient knows to call the clinic with any problems, questions or concerns. We can certainly see the patient much sooner if necessary.   No orders of the defined types were placed in this encounter.  Mikey Bussing, DNP, AGPCNP-BC, AOCNP 08/27/17

## 2017-08-27 NOTE — Telephone Encounter (Signed)
3 cycles already scheduled per 4/1 los - no additional appts to add at the moment.

## 2017-08-27 NOTE — Assessment & Plan Note (Signed)
This is a very pleasant 72 year old white male with a stage IIIb non-small cell lung cancer, squamous cell carcinoma. The patient received acourse of concurrent chemoradiation with weekly carboplatin and paclitaxel.Status post 6 cycles.He tolerated thetreatment fairly well with no significant adverse effects. He is now on consolidation treatment with immunotherapy with Imfinzi (Durvalumab) 10 MG/KG every 2 weeks.  Status post 2 cycles which he is tolerating fairly well.    Recommend that he proceed with cycle 3 of his treatment as scheduled today. He will follow-up in 2 weeks for evaluation prior to cycle #4.  For his cough, he may continue Tussionex.  A refill was given today.    He was advised to call immediately if he has any concerning symptoms in the interval. The patient voices understanding of current disease status and treatment options and is in agreement with the current care plan. All questions were answered. The patient knows to call the clinic with any problems, questions or concerns. We can certainly see the patient much sooner if necessary.

## 2017-08-27 NOTE — Patient Instructions (Signed)
[facility] Discharge Instructions for Patients Receiving Chemotherapy  Today you received the following chemotherapy agents: Imfinzi.  To help prevent nausea and vomiting after your treatment, we encourage you to take your nausea medication as directed.   If you develop nausea and vomiting that is not controlled by your nausea medication, call the clinic.   BELOW ARE SYMPTOMS THAT SHOULD BE REPORTED IMMEDIATELY:  *FEVER GREATER THAN 100.5 F  *CHILLS WITH OR WITHOUT FEVER  NAUSEA AND VOMITING THAT IS NOT CONTROLLED WITH YOUR NAUSEA MEDICATION  *UNUSUAL SHORTNESS OF BREATH  *UNUSUAL BRUISING OR BLEEDING  TENDERNESS IN MOUTH AND THROAT WITH OR WITHOUT PRESENCE OF ULCERS  *URINARY PROBLEMS  *BOWEL PROBLEMS  UNUSUAL RASH Items with * indicate a potential emergency and should be followed up as soon as possible.  Feel free to call the clinic should you have any questions or concerns. The clinic phone number is [phone].  Please show the CHEMO ALERT CARD at check-in to the Emergency Department and triage nurse.   

## 2017-09-10 ENCOUNTER — Telehealth: Payer: Self-pay

## 2017-09-10 ENCOUNTER — Telehealth: Payer: Self-pay | Admitting: Internal Medicine

## 2017-09-10 ENCOUNTER — Encounter: Payer: Self-pay | Admitting: Oncology

## 2017-09-10 ENCOUNTER — Inpatient Hospital Stay: Payer: Medicare Other

## 2017-09-10 ENCOUNTER — Inpatient Hospital Stay (HOSPITAL_BASED_OUTPATIENT_CLINIC_OR_DEPARTMENT_OTHER): Payer: Medicare Other | Admitting: Oncology

## 2017-09-10 VITALS — BP 134/68 | HR 97 | Temp 97.4°F | Resp 18 | Ht 71.0 in | Wt 207.4 lb

## 2017-09-10 DIAGNOSIS — Z79899 Other long term (current) drug therapy: Secondary | ICD-10-CM | POA: Diagnosis not present

## 2017-09-10 DIAGNOSIS — C3491 Malignant neoplasm of unspecified part of right bronchus or lung: Secondary | ICD-10-CM

## 2017-09-10 DIAGNOSIS — Z5112 Encounter for antineoplastic immunotherapy: Secondary | ICD-10-CM | POA: Diagnosis not present

## 2017-09-10 DIAGNOSIS — C3411 Malignant neoplasm of upper lobe, right bronchus or lung: Secondary | ICD-10-CM | POA: Diagnosis not present

## 2017-09-10 LAB — CBC WITH DIFFERENTIAL (CANCER CENTER ONLY)
Basophils Absolute: 0 10*3/uL (ref 0.0–0.1)
Basophils Relative: 0 %
EOS ABS: 0.3 10*3/uL (ref 0.0–0.5)
Eosinophils Relative: 3 %
HCT: 44.1 % (ref 38.4–49.9)
HEMOGLOBIN: 14.4 g/dL (ref 13.0–17.1)
LYMPHS ABS: 1.5 10*3/uL (ref 0.9–3.3)
Lymphocytes Relative: 16 %
MCH: 29.8 pg (ref 27.2–33.4)
MCHC: 32.7 g/dL (ref 32.0–36.0)
MCV: 91.1 fL (ref 79.3–98.0)
MONOS PCT: 7 %
Monocytes Absolute: 0.6 10*3/uL (ref 0.1–0.9)
NEUTROS PCT: 74 %
Neutro Abs: 6.6 10*3/uL — ABNORMAL HIGH (ref 1.5–6.5)
Platelet Count: 212 10*3/uL (ref 140–400)
RBC: 4.84 MIL/uL (ref 4.20–5.82)
RDW: 14.3 % (ref 11.0–14.6)
WBC: 9 10*3/uL (ref 4.0–10.3)

## 2017-09-10 LAB — CMP (CANCER CENTER ONLY)
ALK PHOS: 96 U/L (ref 40–150)
ALT: 18 U/L (ref 0–55)
ANION GAP: 9 (ref 3–11)
AST: 17 U/L (ref 5–34)
Albumin: 3.3 g/dL — ABNORMAL LOW (ref 3.5–5.0)
BUN: 10 mg/dL (ref 7–26)
CALCIUM: 9.8 mg/dL (ref 8.4–10.4)
CO2: 28 mmol/L (ref 22–29)
CREATININE: 0.93 mg/dL (ref 0.70–1.30)
Chloride: 99 mmol/L (ref 98–109)
Glucose, Bld: 217 mg/dL — ABNORMAL HIGH (ref 70–140)
Potassium: 4.6 mmol/L (ref 3.5–5.1)
SODIUM: 136 mmol/L (ref 136–145)
Total Bilirubin: 0.7 mg/dL (ref 0.2–1.2)
Total Protein: 7.6 g/dL (ref 6.4–8.3)

## 2017-09-10 MED ORDER — SODIUM CHLORIDE 0.9 % IV SOLN
Freq: Once | INTRAVENOUS | Status: AC
  Administered 2017-09-10: 12:00:00 via INTRAVENOUS

## 2017-09-10 MED ORDER — SODIUM CHLORIDE 0.9 % IV SOLN
980.0000 mg | Freq: Once | INTRAVENOUS | Status: AC
  Administered 2017-09-10: 980 mg via INTRAVENOUS
  Filled 2017-09-10: qty 10

## 2017-09-10 NOTE — Patient Instructions (Signed)
[facility] Discharge Instructions for Patients Receiving Chemotherapy  Today you received the following chemotherapy agents: Imfinzi.  To help prevent nausea and vomiting after your treatment, we encourage you to take your nausea medication as directed.   If you develop nausea and vomiting that is not controlled by your nausea medication, call the clinic.   BELOW ARE SYMPTOMS THAT SHOULD BE REPORTED IMMEDIATELY:  *FEVER GREATER THAN 100.5 F  *CHILLS WITH OR WITHOUT FEVER  NAUSEA AND VOMITING THAT IS NOT CONTROLLED WITH YOUR NAUSEA MEDICATION  *UNUSUAL SHORTNESS OF BREATH  *UNUSUAL BRUISING OR BLEEDING  TENDERNESS IN MOUTH AND THROAT WITH OR WITHOUT PRESENCE OF ULCERS  *URINARY PROBLEMS  *BOWEL PROBLEMS  UNUSUAL RASH Items with * indicate a potential emergency and should be followed up as soon as possible.  Feel free to call the clinic should you have any questions or concerns. The clinic phone number is [phone].  Please show the CHEMO ALERT CARD at check-in to the Emergency Department and triage nurse.   

## 2017-09-10 NOTE — Telephone Encounter (Signed)
**Note De-Identified  Obfuscation** I did a Metoprolol quantity exception through covermymeds and received the following message: Approved today  PA Case: [id number], Status: Approved, Coverage Starts on: 09/10/2017 12:00:00 AM, Coverage Ends on: 09/10/2018 12:00:00 AM. Questions? Contact [phone].

## 2017-09-10 NOTE — Telephone Encounter (Signed)
Patient currently on schedule thru May for q2w lab/fu/tx as requested per 4/15 los.

## 2017-09-10 NOTE — Telephone Encounter (Signed)
Fax received from [facility] confirming coverage of Metoprolol until 09/10/2018.  I have notified the pts pharmacy.

## 2017-09-10 NOTE — Progress Notes (Signed)
[name] OFFICE PROGRESS NOTE  Randy [name], [place] [address] [place] [address]  DIAGNOSIS: Stage IIIA/B (T4, N1-2, M0)non-small cell lung cancer, squamous cell carcinoma presented with large central right upper lobe lung mass with associated postobstructive pneumonitis in addition to right hilar and questionable right mediastinal lymphadenopathy diagnosed in November 2018.  PRIOR THERAPY: A course of concurrent chemoradiation with weekly carboplatin for AUC of 2 and paclitaxel 45 mg/M2.Status post6cycles.  CURRENT THERAPY: Consolidation immunotherapy with Imfinzi given every 2 weeks. First dose 07/30/2017.Status post 3 cycles.  INTERVAL HISTORY: Randy [name] 72 y.o. male returns for follow-up visit accompanied by his wife.  The patient is feeling fine today has no specific complaints.  He continues to tolerate his treatment with Imfinzi fairly well.  The patient denies fevers chills.  Denies chest pain, shortness breath at rest, cough, hemoptysis.  He has mild shortness of breath with exertion at times.  Denies nausea, vomiting, constipation, diarrhea.  Denies skin rashes.  Denies recent weight loss or night sweats.  The patient is here for evaluation prior to cycle #4 of his treatment.  MEDICAL HISTORY: Past Medical History:  Diagnosis Date  . 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 Repeat US due 2023  . Carotid arterial disease ([place])    a. S/p R CEA;  b. 05/2015 Carotid U/S: RICA 5-39%, LICA 76-73%.  . Coronary artery disease    a. 2012 MI in [place] w/ PCI  //  b. 04/2013 MV: inflat scar, no ischemia.  //  c.   NSTEMI 5/17 >> Promus DES to LCx and POBA to OM1 (ISR).   . Diabetes mellitus without complication ([place])   . Grade II diastolic dysfunction 41/93/7902  . History of cardiac catheterization 10/21/15   a. NSTEMI 5/17: oLAD 40, pLCx 90 (Promus DES), OM2 90 ISR (POBA), oRCA 25   . History of echocardiogram    a.  Mild LVH, EF 50-55%, possible inferolateral HK, grade 2 diastolic dysfunction, trivial AI, MAC  . History of nuclear stress test 04/2013   a. Myoview 12/14: Intermediate risk, prior inferolateral MI, no ischemia, EF 45%  . Hx of leukocytosis   . Hyperlipidemia   . Hypertension   . Hypertensive heart disease   . Morbid obesity ([place])   . Tobacco abuse     ALLERGIES:  is allergic to brilinta [ticagrelor].  MEDICATIONS:  Current Outpatient Medications  Medication Sig Dispense Refill  . aspirin EC 81 MG tablet Take 81 mg by mouth daily.    [name] atorvastatin (LIPITOR) 40 MG tablet TAKE 1 TABLET BY MOUTH EVERY DAY 90 tablet 3  . chlorpheniramine-HYDROcodone (TUSSIONEX) 10-8 MG/5ML SUER Take 5 mLs by mouth every 12 (twelve) hours as needed for cough. 120 mL 0  . clopidogrel (PLAVIX) 75 MG tablet Take 1 tablet (75 mg total) by mouth daily. 90 tablet 3  . cyclobenzaprine (FLEXERIL) 5 MG tablet Take 1-2 tablets (5-10 mg total) by mouth 3 times/day as needed-between meals & bedtime for muscle spasms. (Patient not taking: Reported on 08/27/2017) 30 tablet 1  . metFORMIN (GLUCOPHAGE) 500 MG tablet Take 0.5 tablets (250 mg total) by mouth 2 (two) times daily with a meal. Need A1C checked 30 tablet 0  . metoprolol succinate (TOPROL-XL) 25 MG 24 hr tablet Take 1 tablet (25 mg total) by mouth 3 (three) times daily. Take 2 in morning, 1 in PM 270 tablet 3  . prochlorperazine (COMPAZINE) 10 MG tablet  Take 1 tablet (10 mg total) by mouth every 6 (six) hours as needed for nausea or vomiting. (Patient not taking: Reported on 08/27/2017) 30 tablet 2  . sucralfate (CARAFATE) 1 g tablet Take 1 g by mouth 2 (two) times daily.    . VENTOLIN HFA 108 (90 Base) MCG/ACT inhaler Inhale 1 puff every 4 (four) hours as needed into the lungs for wheezing or shortness of breath. 1 Inhaler 5   No current facility-administered medications for this visit.    Facility-Administered Medications Ordered in Other Visits  Medication Dose  Route Frequency Provider Last Rate Last Dose  . 0.9 %  sodium chloride infusion   Intravenous Once Curt Bears, MD      . durvalumab (IMFINZI) 920 mg in sodium chloride 0.9 % 100 mL chemo infusion  10 mg/kg (Treatment Plan Recorded) Intravenous Once Curt Bears, MD        SURGICAL HISTORY:  Past Surgical History:  Procedure Laterality Date  . CARDIAC CATHETERIZATION    . CARDIAC CATHETERIZATION N/A 10/21/2015   Procedure: Left Heart Cath and Coronary Angiography;  Surgeon: [name], MD;  Location: [place] [facility];  Service: Cardiovascular;  Laterality: N/A;  . CARDIAC CATHETERIZATION N/A 10/21/2015   Procedure: Coronary Stent Intervention;  Surgeon: [name], MD;  Location: [place] [facility];  Service: Cardiovascular;  Laterality: N/A;  . CARDIAC CATHETERIZATION N/A 10/21/2015   Procedure: Coronary Balloon Angioplasty;  Surgeon: [name], MD;  Location: [place] [facility];  Service: Cardiovascular;  Laterality: N/A;  . CAROTID ENDARTERECTOMY    . CORONARY ANGIOPLASTY    . CORONARY STENT PLACEMENT    . VIDEO BRONCHOSCOPY Bilateral 04/03/2017   Procedure: VIDEO BRONCHOSCOPY WITH FLUORO;  Surgeon: [name], MD;  Location: [facility];  Service: Cardiopulmonary;  Laterality: Bilateral;    REVIEW OF SYSTEMS:   Review of Systems  Constitutional: Negative for appetite change, chills, fatigue, fever and unexpected weight change.  HENT:   Negative for mouth sores, nosebleeds, sore throat and trouble swallowing.   Eyes: Negative for eye problems and icterus.  Respiratory: Negative for cough, hemoptysis, shortness of breath at rest and wheezing.  Positive for mild shortness of breath with exertion at times. Cardiovascular: Negative for chest pain and leg swelling.  Gastrointestinal: Negative for abdominal pain, constipation, diarrhea, nausea and vomiting.  Genitourinary: Negative for bladder incontinence, difficulty urinating, dysuria,  frequency and hematuria.   Musculoskeletal: Negative for back pain, gait problem, neck pain and neck stiffness.  Skin: Negative for itching and rash.  Neurological: Negative for dizziness, extremity weakness, gait problem, headaches, light-headedness and seizures.  Hematological: Negative for adenopathy. Does not bruise/bleed easily.  Psychiatric/Behavioral: Negative for confusion, depression and sleep disturbance. The patient is not nervous/anxious.     PHYSICAL EXAMINATION:  Blood pressure 134/68, pulse 97, temperature (!) 97.4 F (36.3 C), temperature source Oral, resp. rate 18, height _0  (1.803 m), weight 207 lb 6.4 oz (94.1 kg), SpO2 95 %.  ECOG PERFORMANCE STATUS: 1 - Symptomatic but completely ambulatory  Physical Exam  Constitutional: Oriented to person, place, and time and well-developed, well-nourished, and in no distress. No distress.  HENT:  Head: Normocephalic and atraumatic.  Mouth/Throat: Oropharynx is clear and moist. No oropharyngeal exudate.  Eyes: Conjunctivae are normal. Right eye exhibits no discharge. Left eye exhibits no discharge. No scleral icterus.  Neck: Normal range of motion. Neck supple.  Cardiovascular: Normal rate, regular rhythm, normal heart sounds and intact distal pulses.  Pulmonary/Chest: Effort normal and breath sounds normal. No respiratory distress. No wheezes. No rales.  Abdominal: Soft. Bowel sounds are normal. Exhibits no distension and no mass. There is no tenderness.  Musculoskeletal: Normal range of motion. Exhibits no edema.  Lymphadenopathy:    No cervical adenopathy.  Neurological: Alert and oriented to person, place, and time. Exhibits normal muscle tone. Gait normal. Coordination normal.  Skin: Skin is warm and dry. No rash noted. Not diaphoretic. No erythema. No pallor.  Psychiatric: Mood, memory and judgment normal.  Vitals reviewed.  LABORATORY DATA: Lab Results  Component Value Date   WBC 9.0 09/10/2017   HGB 12.7 (L)  06/25/2017   HCT 44.1 09/10/2017   MCV 91.1 09/10/2017   PLT 212 09/10/2017      Chemistry      Component Value Date/Time   NA 136 09/10/2017 1108   NA 133 (L) 05/28/2017 0932   K 4.6 09/10/2017 1108   K 4.4 05/28/2017 0932   CL 99 09/10/2017 1108   CO2 28 09/10/2017 1108   CO2 24 05/28/2017 0932   BUN 10 09/10/2017 1108   BUN 9.3 05/28/2017 0932   CREATININE 0.93 09/10/2017 1108   CREATININE 0.8 05/28/2017 0932      Component Value Date/Time   CALCIUM 9.8 09/10/2017 1108   CALCIUM 8.5 05/28/2017 0932   ALKPHOS 96 09/10/2017 1108   ALKPHOS 68 05/28/2017 0932   AST 17 09/10/2017 1108   AST 13 05/28/2017 0932   ALT 18 09/10/2017 1108   ALT 16 05/28/2017 0932   BILITOT 0.7 09/10/2017 1108   BILITOT 0.40 05/28/2017 0932       RADIOGRAPHIC STUDIES:  No results found.   ASSESSMENT/PLAN:  Stage III squamous cell carcinoma of right lung [facility]) This is a very pleasant 72 year old white male with a stage IIIb non-small cell lung cancer, squamous cell carcinoma. The patient received acourse of concurrent chemoradiation with weekly carboplatin and paclitaxel.Status post 6 cycles.He tolerated thetreatment fairly well with no significant adverse effects. He is now onconsolidation treatment with immunotherapy with Imfinzi (Durvalumab) 10 MG/KG every 2 weeks.Status post 3 cycles which he is tolerating fairly well.   The patient was seen with Dr. [name].  Recommend that he proceed with cycle 4 of his treatment as scheduled today.  He will have a restaging CT scan of the chest after 6 cycles of Imfinzi. He will follow-up in 2 weeks for evaluation prior to cycle #5.  He was advised to call immediately if he has any concerning symptoms in the interval. The patient voices understanding of current disease status and treatment options and is in agreement with the current care plan. All questions were answered. The patient knows to call the clinic with any problems, questions or  concerns. We can certainly see the patient much sooner if necessary.   No orders of the defined types were placed in this encounter.  Mikey Bussing, DNP, AGPCNP-BC, AOCNP 09/10/17   ADDENDUM: Hematology/Oncology Attending: I had a face-to-face encounter with the patient.  I recommended his care plan.  This is a very pleasant 72 years old white male with a stage IIIb non-small cell lung cancer, squamous cell carcinoma status post a course of induction concurrent chemoradiation with weekly carboplatin and paclitaxel with partial response.  He is currently undergoing treatment with immunotherapy with Imfinzi (Durvalumab) status post 3 cycles.  The patient has been tolerating this treatment well with no concerning complaints. I recommended for him to proceed with cycle #4  today as a schedule. I will see the patient back for follow-up visit in 2 weeks for evaluation before the next cycle of his treatment. He was advised to call immediately if he has any concerning symptoms in the interval.  Disclaimer: This note was dictated with voice recognition software. Similar sounding words can inadvertently be transcribed and may be missed upon review. Eilleen Kempf, MD 09/10/17

## 2017-09-10 NOTE — Assessment & Plan Note (Signed)
This is a very pleasant 72 year old white male with a stage IIIb non-small cell lung cancer, squamous cell carcinoma. The patient received acourse of concurrent chemoradiation with weekly carboplatin and paclitaxel.Status post 6 cycles.He tolerated thetreatment fairly well with no significant adverse effects. He is now onconsolidation treatment with immunotherapy with Imfinzi (Durvalumab) 10 MG/KG every 2 weeks.Status post 3 cycles which he is tolerating fairly well.   The patient was seen with Dr. [name].  Recommend that he proceed with cycle 4 of his treatment as scheduled today.  He will have a restaging CT scan of the chest after 6 cycles of Imfinzi. He will follow-up in 2 weeks for evaluation prior to cycle #5.  He was advised to call immediately if he has any concerning symptoms in the interval. The patient voices understanding of current disease status and treatment options and is in agreement with the current care plan. All questions were answered. The patient knows to call the clinic with any problems, questions or concerns. We can certainly see the patient much sooner if necessary.

## 2017-09-14 ENCOUNTER — Other Ambulatory Visit: Payer: Self-pay | Admitting: Physician Assistant

## 2017-09-21 ENCOUNTER — Ambulatory Visit: Payer: Medicare Other | Admitting: Physician Assistant

## 2017-09-24 ENCOUNTER — Encounter: Payer: Self-pay | Admitting: Oncology

## 2017-09-24 ENCOUNTER — Other Ambulatory Visit: Payer: Self-pay

## 2017-09-24 ENCOUNTER — Inpatient Hospital Stay (HOSPITAL_BASED_OUTPATIENT_CLINIC_OR_DEPARTMENT_OTHER): Payer: Medicare Other | Admitting: Oncology

## 2017-09-24 ENCOUNTER — Inpatient Hospital Stay: Payer: Medicare Other

## 2017-09-24 ENCOUNTER — Telehealth: Payer: Self-pay | Admitting: Internal Medicine

## 2017-09-24 VITALS — BP 129/58 | HR 74 | Temp 98.3°F | Resp 18 | Ht 71.0 in | Wt 205.0 lb

## 2017-09-24 DIAGNOSIS — R05 Cough: Secondary | ICD-10-CM

## 2017-09-24 DIAGNOSIS — C3411 Malignant neoplasm of upper lobe, right bronchus or lung: Secondary | ICD-10-CM | POA: Diagnosis not present

## 2017-09-24 DIAGNOSIS — C3491 Malignant neoplasm of unspecified part of right bronchus or lung: Secondary | ICD-10-CM | POA: Diagnosis not present

## 2017-09-24 DIAGNOSIS — R5383 Other fatigue: Secondary | ICD-10-CM

## 2017-09-24 DIAGNOSIS — Z79899 Other long term (current) drug therapy: Secondary | ICD-10-CM | POA: Diagnosis not present

## 2017-09-24 DIAGNOSIS — Z5112 Encounter for antineoplastic immunotherapy: Secondary | ICD-10-CM

## 2017-09-24 LAB — CMP (CANCER CENTER ONLY)
ALT: 15 U/L (ref 0–55)
ANION GAP: 9 (ref 3–11)
AST: 16 U/L (ref 5–34)
Albumin: 3.5 g/dL (ref 3.5–5.0)
Alkaline Phosphatase: 79 U/L (ref 40–150)
BUN: 9 mg/dL (ref 7–26)
CHLORIDE: 100 mmol/L (ref 98–109)
CO2: 26 mmol/L (ref 22–29)
CREATININE: 0.82 mg/dL (ref 0.70–1.30)
Calcium: 9.6 mg/dL (ref 8.4–10.4)
Glucose, Bld: 100 mg/dL (ref 70–140)
POTASSIUM: 4.5 mmol/L (ref 3.5–5.1)
Sodium: 135 mmol/L — ABNORMAL LOW (ref 136–145)
Total Bilirubin: 0.6 mg/dL (ref 0.2–1.2)
Total Protein: 7.8 g/dL (ref 6.4–8.3)

## 2017-09-24 LAB — CBC WITH DIFFERENTIAL (CANCER CENTER ONLY)
Basophils Absolute: 0 10*3/uL (ref 0.0–0.1)
Basophils Relative: 0 %
EOS PCT: 4 %
Eosinophils Absolute: 0.4 10*3/uL (ref 0.0–0.5)
HCT: 41.3 % (ref 38.4–49.9)
Hemoglobin: 13.6 g/dL (ref 13.0–17.1)
LYMPHS ABS: 1.7 10*3/uL (ref 0.9–3.3)
Lymphocytes Relative: 18 %
MCH: 29.8 pg (ref 27.2–33.4)
MCHC: 32.9 g/dL (ref 32.0–36.0)
MCV: 90.4 fL (ref 79.3–98.0)
Monocytes Absolute: 1.2 10*3/uL — ABNORMAL HIGH (ref 0.1–0.9)
Monocytes Relative: 12 %
Neutro Abs: 6.3 10*3/uL (ref 1.5–6.5)
Neutrophils Relative %: 66 %
PLATELETS: 230 10*3/uL (ref 140–400)
RBC: 4.57 MIL/uL (ref 4.20–5.82)
RDW: 13.9 % (ref 11.0–14.6)
WBC Count: 9.5 10*3/uL (ref 4.0–10.3)

## 2017-09-24 LAB — TSH: TSH: 1.154 u[IU]/mL (ref 0.320–4.118)

## 2017-09-24 MED ORDER — SODIUM CHLORIDE 0.9 % IV SOLN
Freq: Once | INTRAVENOUS | Status: AC
  Administered 2017-09-24: 15:00:00 via INTRAVENOUS

## 2017-09-24 MED ORDER — HYDROCOD POLST-CPM POLST ER 10-8 MG/5ML PO SUER: 5.0000 mL | Freq: Two times a day (BID) | ORAL | 0 refills | Status: DC | PRN

## 2017-09-24 MED ORDER — SODIUM CHLORIDE 0.9 % IV SOLN
980.0000 mg | Freq: Once | INTRAVENOUS | Status: AC
  Administered 2017-09-24: 980 mg via INTRAVENOUS
  Filled 2017-09-24: qty 10

## 2017-09-24 NOTE — Assessment & Plan Note (Signed)
This is a very pleasant 72year old white male with a stage IIIb non-small cell lung cancer, squamous cell carcinoma. The patient received acourse of concurrent chemoradiation with weekly carboplatin and paclitaxel.Status post 6 cycles.He tolerated thetreatment fairly well with no significant adverse effects. He is now onconsolidation treatment with immunotherapy with Imfinzi (Durvalumab) 10 MG/KG every 2 weeks.Status post4cycleswhich he is toleratingfairly well. Recommend that he proceed with cycle 5 of his treatment as scheduled today.He will follow-up in 2 weeks for evaluation prior to cycle #6.  Fourth cough, he may continue Tussionex.  A refill was provided today.  He was advised to call immediately if he has any concerning symptoms in the interval. The patient voices understanding of current disease status and treatment options and is in agreement with the current care plan. All questions were answered. The patient knows to call the clinic with any problems, questions or concerns. We can certainly see the patient much sooner if necessary.

## 2017-09-24 NOTE — Telephone Encounter (Signed)
Appointments scheduled pt to get scheduled after infusion per 4/29 los

## 2017-09-24 NOTE — Patient Instructions (Signed)
[facility] Discharge Instructions for Patients Receiving Chemotherapy  Today you received the following chemotherapy agents: Imfinzi.  To help prevent nausea and vomiting after your treatment, we encourage you to take your nausea medication as directed.   If you develop nausea and vomiting that is not controlled by your nausea medication, call the clinic.   BELOW ARE SYMPTOMS THAT SHOULD BE REPORTED IMMEDIATELY:  *FEVER GREATER THAN 100.5 F  *CHILLS WITH OR WITHOUT FEVER  NAUSEA AND VOMITING THAT IS NOT CONTROLLED WITH YOUR NAUSEA MEDICATION  *UNUSUAL SHORTNESS OF BREATH  *UNUSUAL BRUISING OR BLEEDING  TENDERNESS IN MOUTH AND THROAT WITH OR WITHOUT PRESENCE OF ULCERS  *URINARY PROBLEMS  *BOWEL PROBLEMS  UNUSUAL RASH Items with * indicate a potential emergency and should be followed up as soon as possible.  Feel free to call the clinic should you have any questions or concerns. The clinic phone number is [phone].  Please show the CHEMO ALERT CARD at check-in to the Emergency Department and triage nurse.   

## 2017-09-24 NOTE — Progress Notes (Signed)
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  PRIOR THERAPY: A course of concurrent chemoradiation with weekly carboplatin for AUC of 2 and paclitaxel 45 mg/M2.Status post6cycles.  CURRENT THERAPY: Consolidation immunotherapy with Imfinzi given every 2 weeks. First dose 07/30/2017.Status post4cycles.  INTERVAL HISTORY: Randy [name] 73 y.o. male returns for routine follow-up visit accompanied by his wife.  The patient is feeling fine today and has no specific complaints except for ongoing nonproductive cough.  The patient denies fevers and chills.  Denies chest pain, shortness of breath, hemoptysis.  Denies nausea, vomiting, constipation, diarrhea.  Denies skin rashes.  Denies recent weight loss or night sweats.  The patient is here for evaluation prior to cycle #5 of his treatment.  MEDICAL HISTORY: Past Medical History:  Diagnosis Date  . 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 Repeat US due 2023  . Carotid arterial disease ([place])    a. S/p R CEA;  b. 05/2015 Carotid U/S: RICA 1-49%, LICA 70-26%.  . Coronary artery disease    a. 2012 MI in [place] w/ PCI  //  b. 04/2013 MV: inflat scar, no ischemia.  //  c.   NSTEMI 5/17 >> Promus DES to LCx and POBA to OM1 (ISR).   . Diabetes mellitus without complication ([place])   . Grade II diastolic dysfunction 37/85/8850  . History of cardiac catheterization 10/21/15   a. NSTEMI 5/17: oLAD 40, pLCx 90 (Promus DES), OM2 90 ISR (POBA), oRCA 25   . History of echocardiogram    a. Mild LVH, EF 50-55%, possible inferolateral HK, grade 2 diastolic dysfunction,  trivial AI, MAC  . History of nuclear stress test 04/2013   a. Myoview 12/14: Intermediate risk, prior inferolateral MI, no ischemia, EF 45%  . Hx of leukocytosis   . Hyperlipidemia   . Hypertension   . Hypertensive heart disease   . Morbid obesity ([place])   . Tobacco abuse     ALLERGIES:  is allergic to brilinta [ticagrelor].  MEDICATIONS:  Current Outpatient Medications  Medication Sig Dispense Refill  . aspirin EC 81 MG tablet Take 81 mg by mouth daily.    [name] atorvastatin (LIPITOR) 40 MG tablet TAKE 1 TABLET BY MOUTH EVERY DAY 90 tablet 3  . chlorpheniramine-HYDROcodone (TUSSIONEX) 10-8 MG/5ML SUER Take 5 mLs by mouth every 12 (twelve) hours as needed for cough. 120 mL 0  . clopidogrel (PLAVIX) 75 MG tablet Take 1 tablet (75 mg total) by mouth daily. 90 tablet 3  . metFORMIN (GLUCOPHAGE) 500 MG tablet Take 0.5 tablets (250 mg total) by mouth 2 (two) times daily with a meal. Due for follow up visit 15 tablet 0  . metoprolol succinate (TOPROL-XL) 25 MG 24 hr tablet Take 1 tablet (25 mg total) by mouth 3 (three) times daily. Take 2 in morning, 1 in PM 270 tablet 3  . VENTOLIN HFA 108 (90 Base) MCG/ACT inhaler Inhale 1 puff every 4 (four) hours as needed into the lungs for wheezing or shortness of breath. 1 Inhaler 5  . cyclobenzaprine (FLEXERIL) 5 MG tablet Take 1-2 tablets (5-10 mg total) by mouth 3 times/day as needed-between meals & bedtime for muscle spasms. (Patient not  taking: Reported on 08/27/2017) 30 tablet 1   No current facility-administered medications for this visit.    Facility-Administered Medications Ordered in Other Visits  Medication Dose Route Frequency Provider Last Rate Last Dose  . durvalumab (IMFINZI) 980 mg in sodium chloride 0.9 % 100 mL chemo infusion  980 mg Intravenous Once Curt Bears, MD 120 mL/hr at 09/24/17 1527 980 mg at 09/24/17 1527    SURGICAL HISTORY:  Past Surgical History:  Procedure Laterality Date  . CARDIAC CATHETERIZATION    . CARDIAC  CATHETERIZATION N/A 10/21/2015   Procedure: Left Heart Cath and Coronary Angiography;  Surgeon: [name], MD;  Location: [facility];  Service: Cardiovascular;  Laterality: N/A;  . CARDIAC CATHETERIZATION N/A 10/21/2015   Procedure: Coronary Stent Intervention;  Surgeon: [name], MD;  Location: [facility];  Service: Cardiovascular;  Laterality: N/A;  . CARDIAC CATHETERIZATION N/A 10/21/2015   Procedure: Coronary Balloon Angioplasty;  Surgeon: [name], MD;  Location: [facility];  Service: Cardiovascular;  Laterality: N/A;  . CAROTID ENDARTERECTOMY    . CORONARY ANGIOPLASTY    . CORONARY STENT PLACEMENT    . VIDEO BRONCHOSCOPY Bilateral 04/03/2017   Procedure: VIDEO BRONCHOSCOPY WITH FLUORO;  Surgeon: [name], MD;  Location: [facility];  Service: Cardiopulmonary;  Laterality: Bilateral;    REVIEW OF SYSTEMS:   Review of Systems  Constitutional: Negative for appetite change, chills, fatigue, fever and unexpected weight change.  HENT:   Negative for mouth sores, nosebleeds, sore throat and trouble swallowing.   Eyes: Negative for eye problems and icterus.  Respiratory: Negative for hemoptysis, shortness of breath and wheezing.  Positive for nonproductive cough.  Cardiovascular: Negative for chest pain and leg swelling.  Gastrointestinal: Negative for abdominal pain, constipation, diarrhea, nausea and vomiting.  Genitourinary: Negative for bladder incontinence, difficulty urinating, dysuria, frequency and hematuria.   Musculoskeletal: Negative for back pain, gait problem, neck pain and neck stiffness.  Skin: Negative for itching and rash.  Neurological: Negative for dizziness, extremity weakness, gait problem, headaches, light-headedness and seizures.  Hematological: Negative for adenopathy. Does not bruise/bleed easily.  Psychiatric/Behavioral: Negative for confusion, depression and sleep disturbance. The patient is not  nervous/anxious.     PHYSICAL EXAMINATION:  Blood pressure (!) 129/58, pulse 74, temperature 98.3 F (36.8 C), temperature source Oral, resp. rate 18, height _0  (1.803 m), weight 205 lb (93 kg), SpO2 96 %.  ECOG PERFORMANCE STATUS: 1 - Symptomatic but completely ambulatory  Physical Exam  Constitutional: Oriented to person, place, and time and well-developed, well-nourished, and in no distress. No distress.  HENT:  Head: Normocephalic and atraumatic.  Mouth/Throat: Oropharynx is clear and moist. No oropharyngeal exudate.  Eyes: Conjunctivae are normal. Right eye exhibits no discharge. Left eye exhibits no discharge. No scleral icterus.  Neck: Normal range of motion. Neck supple.  Cardiovascular: Normal rate, regular rhythm, normal heart sounds and intact distal pulses.   Pulmonary/Chest: Effort normal and breath sounds normal. No respiratory distress. No wheezes. No rales.  Abdominal: Soft. Bowel sounds are normal. Exhibits no distension and no mass. There is no tenderness.  Musculoskeletal: Normal range of motion. Exhibits no edema.  Lymphadenopathy:    No cervical adenopathy.  Neurological: Alert and oriented to person, place, and time. Exhibits normal muscle tone. Gait normal. Coordination normal.  Skin: Skin is warm and dry. No rash noted. Not diaphoretic. No erythema. No pallor.  Psychiatric: Mood, memory and judgment normal.  Vitals reviewed.  LABORATORY  DATA: Lab Results  Component Value Date   WBC 9.5 09/24/2017   HGB 13.6 09/24/2017   HCT 41.3 09/24/2017   MCV 90.4 09/24/2017   PLT 230 09/24/2017      Chemistry      Component Value Date/Time   NA 135 (L) 09/24/2017 1309   NA 133 (L) 05/28/2017 0932   K 4.5 09/24/2017 1309   K 4.4 05/28/2017 0932   CL 100 09/24/2017 1309   CO2 26 09/24/2017 1309   CO2 24 05/28/2017 0932   BUN 9 09/24/2017 1309   BUN 9.3 05/28/2017 0932   CREATININE 0.82 09/24/2017 1309   CREATININE 0.8 05/28/2017 0932      Component  Value Date/Time   CALCIUM 9.6 09/24/2017 1309   CALCIUM 8.5 05/28/2017 0932   ALKPHOS 79 09/24/2017 1309   ALKPHOS 68 05/28/2017 0932   AST 16 09/24/2017 1309   AST 13 05/28/2017 0932   ALT 15 09/24/2017 1309   ALT 16 05/28/2017 0932   BILITOT 0.6 09/24/2017 1309   BILITOT 0.40 05/28/2017 0932       RADIOGRAPHIC STUDIES:  No results found.   ASSESSMENT/PLAN:  Stage III squamous cell carcinoma of right lung ([place]) This is a very pleasant 72year old white male with a stage IIIb non-small cell lung cancer, squamous cell carcinoma. The patient received acourse of concurrent chemoradiation with weekly carboplatin and paclitaxel.Status post 6 cycles.He tolerated thetreatment fairly well with no significant adverse effects. He is now onconsolidation treatment with immunotherapy with Imfinzi (Durvalumab) 10 MG/KG every 2 weeks.Status post4cycleswhich he is toleratingfairly well. Recommend that he proceed with cycle 5 of his treatment as scheduled today.He will follow-up in 2 weeks for evaluation prior to cycle #6.  Fourth cough, he may continue Tussionex.  A refill was provided today.  He was advised to call immediately if he has any concerning symptoms in the interval. The patient voices understanding of current disease status and treatment options and is in agreement with the current care plan. All questions were answered. The patient knows to call the clinic with any problems, questions or concerns. We can certainly see the patient much sooner if necessary.   No orders of the defined types were placed in this encounter.  Randy Bussing, DNP, AGPCNP-BC, AOCNP 09/24/17

## 2017-09-26 ENCOUNTER — Encounter: Payer: Self-pay | Admitting: Physician Assistant

## 2017-09-26 ENCOUNTER — Ambulatory Visit (INDEPENDENT_AMBULATORY_CARE_PROVIDER_SITE_OTHER): Payer: Medicare Other | Admitting: Physician Assistant

## 2017-09-26 VITALS — BP 126/83 | HR 84 | Wt 202.0 lb

## 2017-09-26 DIAGNOSIS — E119 Type 2 diabetes mellitus without complications: Secondary | ICD-10-CM

## 2017-09-26 DIAGNOSIS — I10 Essential (primary) hypertension: Secondary | ICD-10-CM | POA: Diagnosis not present

## 2017-09-26 DIAGNOSIS — I2510 Atherosclerotic heart disease of native coronary artery without angina pectoris: Secondary | ICD-10-CM | POA: Diagnosis not present

## 2017-09-26 DIAGNOSIS — I152 Hypertension secondary to endocrine disorders: Secondary | ICD-10-CM

## 2017-09-26 DIAGNOSIS — Z6828 Body mass index (BMI) 28.0-28.9, adult: Secondary | ICD-10-CM

## 2017-09-26 DIAGNOSIS — H61891 Other specified disorders of right external ear: Secondary | ICD-10-CM | POA: Insufficient documentation

## 2017-09-26 DIAGNOSIS — E1159 Type 2 diabetes mellitus with other circulatory complications: Secondary | ICD-10-CM

## 2017-09-26 DIAGNOSIS — Z79899 Other long term (current) drug therapy: Secondary | ICD-10-CM

## 2017-09-26 DIAGNOSIS — E441 Mild protein-calorie malnutrition: Secondary | ICD-10-CM | POA: Insufficient documentation

## 2017-09-26 LAB — POCT GLYCOSYLATED HEMOGLOBIN (HGB A1C): Hemoglobin A1C: 6.3

## 2017-09-26 MED ORDER — LISINOPRIL 5 MG PO TABS: 5.0000 mg | ORAL_TABLET | Freq: Every day | ORAL | 3 refills | Status: DC

## 2017-09-26 MED ORDER — METFORMIN HCL 500 MG PO TABS: 250.0000 mg | ORAL_TABLET | Freq: Two times a day (BID) | ORAL | 1 refills | Status: DC

## 2017-09-26 NOTE — Progress Notes (Signed)
HPI:                                                                Randy [name] is a 72 y.o. male who presents to [facility] today for medication management  DMII: taking Metformin 250 mg bid. Compliant with medications. Last A1C 6.2, 6 months ago. Denies hypoglycemic events. Denies polydipsia, polyuria, polyphagia. Denies blurred vision or vision change. Denies extremity pain, altered sensation and paresthesias.  Denies ulcers/wounds on feet. Eye exam: overdue, patient plans to schedule at [facility] exam: due today  HTN/CAD: taking Metoprolol XL 25 mg daily. Compliant with medications. Does not check BP's at home.  Denies vision change, headache, chest pain with exertion, orthopnea, lightheadedness, syncope and edema.    He is concerned about a spot on the back of his left ear. States his wife noticed it and wondered if there was a skin infection. He denies pain, ithcing or drainage.      Depression screen PHQ 2/9 03/12/2017  Decreased Interest 0  Down, Depressed, Hopeless 0  PHQ - 2 Score 0    No flowsheet data found.    Past Medical History:  Diagnosis Date  . 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 Repeat US due 2023  . Carotid arterial disease ([place])    a. S/p R CEA;  b. 05/2015 Carotid U/S: RICA 4-19%, LICA 37-90%.  . Coronary artery disease    a. 2012 MI in [place] w/ PCI  //  b. 04/2013 MV: inflat scar, no ischemia.  //  c.   NSTEMI 5/17 >> Promus DES to LCx and POBA to OM1 (ISR).   . Diabetes mellitus without complication ([place])   . Grade II diastolic dysfunction 24/01/7352  . History of cardiac catheterization 10/21/15   a. NSTEMI 5/17: oLAD 40, pLCx 90 (Promus DES), OM2 90 ISR (POBA), oRCA 25   . History of echocardiogram    a. Mild LVH, EF 50-55%, possible inferolateral HK, grade 2 diastolic dysfunction, trivial AI, MAC  . History of nuclear stress test 04/2013   a. Myoview 12/14: Intermediate risk,  prior inferolateral MI, no ischemia, EF 45%  . Hx of leukocytosis   . Hyperlipidemia   . Hypertension   . Hypertensive heart disease   . Morbid obesity ([place])   . Tobacco abuse    Past Surgical History:  Procedure Laterality Date  . CARDIAC CATHETERIZATION    . CARDIAC CATHETERIZATION N/A 10/21/2015   Procedure: Left Heart Cath and Coronary Angiography;  Surgeon: [name], MD;  Location: [facility];  Service: Cardiovascular;  Laterality: N/A;  . CARDIAC CATHETERIZATION N/A 10/21/2015   Procedure: Coronary Stent Intervention;  Surgeon: [name], MD;  Location: [facility];  Service: Cardiovascular;  Laterality: N/A;  . CARDIAC CATHETERIZATION N/A 10/21/2015   Procedure: Coronary Balloon Angioplasty;  Surgeon: [name], MD;  Location: [facility];  Service: Cardiovascular;  Laterality: N/A;  . CAROTID ENDARTERECTOMY    . CORONARY ANGIOPLASTY    . CORONARY STENT PLACEMENT    . VIDEO BRONCHOSCOPY Bilateral 04/03/2017   Procedure: VIDEO BRONCHOSCOPY WITH FLUORO;  Surgeon: [name], MD;  [facility];  Service: Cardiopulmonary;  Laterality: Bilateral;   Social History   Tobacco Use  . Smoking status: Former Smoker    Packs/day: 0.75    Years: 40.00    Pack years: 30.00    Types: Cigarettes    Start date: 06/18/1972    Last attempt to quit: 10/24/2015    Years since quitting: 1.9  . Smokeless tobacco: Never Used  Substance Use Topics  . Alcohol use: No    Alcohol/week: 0.0 oz   family history includes Hypertension in his father; Other in his mother.    ROS: negative except as noted in the HPI  Medications: Current Outpatient Medications  Medication Sig Dispense Refill  . aspirin EC 81 MG tablet Take 81 mg by mouth daily.    [name] atorvastatin (LIPITOR) 40 MG tablet TAKE 1 TABLET BY MOUTH EVERY DAY 90 tablet 3  . chlorpheniramine-HYDROcodone (TUSSIONEX) 10-8 MG/5ML SUER Take 5 mLs by mouth every 12 (twelve)  hours as needed for cough. 120 mL 0  . clopidogrel (PLAVIX) 75 MG tablet Take 1 tablet (75 mg total) by mouth daily. 90 tablet 3  . cyclobenzaprine (FLEXERIL) 5 MG tablet Take 1-2 tablets (5-10 mg total) by mouth 3 times/day as needed-between meals & bedtime for muscle spasms. 30 tablet 1  . metFORMIN (GLUCOPHAGE) 500 MG tablet Take 0.5 tablets (250 mg total) by mouth 2 (two) times daily with a meal. 180 tablet 1  . metoprolol succinate (TOPROL-XL) 25 MG 24 hr tablet Take 1 tablet (25 mg total) by mouth 3 (three) times daily. Take 2 in morning, 1 in PM 270 tablet 3  . VENTOLIN HFA 108 (90 Base) MCG/ACT inhaler Inhale 1 puff every 4 (four) hours as needed into the lungs for wheezing or shortness of breath. 1 Inhaler 5   No current facility-administered medications for this visit.    Allergies  Allergen Reactions  . Brilinta [Ticagrelor] Shortness Of Breath       Objective:  BP 126/83   Pulse 84   Wt 202 lb (91.6 kg)   BMI 28.17 kg/m  Gen:  alert, not ill-appearing, no distress, appropriate for age 39: head normocephalic without obvious abnormality, conjunctiva and cornea clear, wearing glasses, trachea midline Pulm: Normal work of breathing, normal phonation, clear to auscultation bilaterally, no wheezes, rales or rhonchi CV: Normal rate, regular rhythm, s1 and s2 distinct, no murmurs, clicks or rubs  Neuro: alert and oriented x 3, no tremor MSK: extremities atraumatic, normal gait and station Skin: intact, posterior auricle there is a 78m cystic-like smooth nodule with regular borders Psych: well-groomed, cooperative, good eye contact, euthymic mood, affect mood-congruent, speech is articulate, and thought processes clear and goal-directed  Diabetic Foot Exam - Simple   Simple Foot Form Diabetic Foot exam was performed with the following findings:  Yes 09/26/2017  2:47 PM  Visual Inspection No deformities, no ulcerations, no other skin breakdown bilaterally:  Yes Sensation  Testing Intact to touch and monofilament testing bilaterally:  Yes Pulse Check Posterior Tibialis and Dorsalis pulse intact bilaterally:  Yes Comments      Results for orders placed or performed in visit on 09/24/17 (from the past 72 hour(s))  TSH     Status: None   Collection Time: 09/24/17  1:09 PM  Result Value Ref Range   TSH 1.154 0.320 - 4.118 uIU/mL    Comment: [facility]formed at [facility], [address], [place] [id number] CBC with Differential (Cancer Center Only)     Status: Abnormal  Collection Time: 09/24/17  1:09 PM  Result Value Ref Range   WBC Count 9.5 4.0 - 10.3 K/uL   RBC 4.57 4.20 - 5.82 MIL/uL   Hemoglobin 13.6 13.0 - 17.1 g/dL   HCT 41.3 38.4 - 49.9 %   MCV 90.4 79.3 - 98.0 fL   MCH 29.8 27.2 - 33.4 pg   MCHC 32.9 32.0 - 36.0 g/dL   RDW 13.9 11.0 - 14.6 %   Platelet Count 230 140 - 400 K/uL   Neutrophils Relative % 66 %   Neutro Abs 6.3 1.5 - 6.5 K/uL   Lymphocytes Relative 18 %   Lymphs Abs 1.7 0.9 - 3.3 K/uL   Monocytes Relative 12 %   Monocytes Absolute 1.2 (H) 0.1 - 0.9 K/uL   Eosinophils Relative 4 %   Eosinophils Absolute 0.4 0.0 - 0.5 K/uL   Basophils Relative 0 %   Basophils Absolute 0.0 0.0 - 0.1 K/uL    Comment: Performed at [facility], [place] [address] [place], [place] [address]  CMP ([place] only)     Status: Abnormal   Collection Time: 09/24/17  1:09 PM  Result Value Ref Range   Sodium 135 (L) 136 - 145 mmol/L   Potassium 4.5 3.5 - 5.1 mmol/L   Chloride 100 98 - 109 mmol/L   CO2 26 22 - 29 mmol/L   Glucose, Bld 100 70 - 140 mg/dL   BUN 9 7 - 26 mg/dL   Creatinine 0.82 0.70 - 1.30 mg/dL   Calcium 9.6 8.4 - 10.4 mg/dL   Total Protein 7.8 6.4 - 8.3 g/dL   Albumin 3.5 3.5 - 5.0 g/dL   AST 16 5 - 34 U/L   ALT 15 0 - 55 U/L   Alkaline Phosphatase 79 40 - 150 U/L   Total Bilirubin 0.6 0.2 - 1.2 mg/dL   GFR, Est Non Af Am >60 >60 mL/min   GFR, Est AFR Am >60 >60 mL/min     Comment: (NOTE) The eGFR has been calculated using the CKD EPI equation. This calculation has not been validated in all clinical situations. eGFR's persistently <60 mL/min signify possible Chronic Kidney Disease.    Anion gap 9 3 - 11    Comment: Performed at [facility], [address] [place], [place] [address]   No results found.    Assessment and Plan: 72 y.o. male with   1. Diabetes mellitus without complication ([place]) Lab Results  Component Value Date   HGBA1C 6.3 09/26/2017  - well controlled on low-dose Metformin and lifestyle measures - starting ACE - cont statin - counseled on diabetic preventive care - foot exam normal today - declines referral to Ophthalmology, will schedule his own eye exam  2. Hypertension goal BP (blood pressure) < 140/90 BP Readings from Last 3 Encounters:  09/26/17 126/83  09/24/17 (!) 129/58  09/10/17 134/68  - BP in range. Cont beta blocker. I am adding low-dose ACE for prevention of diabetic nephropathy  3. CAD - compliant with high-intensity statin therapy - last LDL 55, 11 months ago. He is having frequent blood draws with the cancer center, so we will defer lipid panel today. Reviewed CMP, normal transaminases  4. Nodule of right external ear - benign characteristics, asymptomatic. Active surveillance  5. Mild protein-calorie malnutrition (HCC) - Albumin 3.3 on 09/10/17, recommended adding Glucerna shake as a snack in addition to his regular ADA diet   6. Encounter for medication management  Patient education and anticipatory guidance given Patient agrees with treatment plan Follow-up in 6 months or sooner as needed if symptoms worsen or fail to improve  Darlyne Russian PA-C

## 2017-09-26 NOTE — Patient Instructions (Addendum)
Nodule on your right ear appears benign. 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 Follow ADA dietary guidelines - 30-35% protein - 35-40% fat - 35% carbs  Diabetes Preventive Care: - annual foot exam  - annual dilated eye exam with an eye doctor - self foot exams at least weekly - twice yearly dental cleanings and yearly exam - goal blood pressure <140/90, ideally <130/80 - LDL cholesterol <70 - A1C <7.0 - body mass index (BMI) <25.0 - follow-up every 3 months if your A1C is not at goal - follow-up every 6 months if diabetes is well controlled   Diabetes Mellitus and Nutrition When you have diabetes (diabetes mellitus), it is very important to have healthy eating habits because your blood sugar (glucose) levels are greatly affected by what you eat and drink. Eating healthy foods in the appropriate amounts, at about the same times every day, can help you:  Control your blood glucose.  Lower your risk of heart disease.  Improve your blood pressure.  Reach or maintain a healthy weight.  Every person with diabetes is different, and each person has different needs for a meal plan. Your health care provider may recommend that you work with a diet and nutrition specialist (dietitian) to make a meal plan that is best for you. Your meal plan may vary depending on factors such as:  The calories you need.  The medicines you take.  Your weight.  Your blood glucose, blood pressure, and cholesterol levels.  Your activity level.  Other health conditions you have, such as heart or kidney disease.  How do carbohydrates affect me? Carbohydrates affect  your blood glucose level more than any other type of food. Eating carbohydrates naturally increases the amount of glucose in your blood. Carbohydrate counting is a method for keeping track of how many carbohydrates you eat. Counting carbohydrates is important to keep your blood glucose at a healthy level, especially if you use insulin or take certain oral diabetes medicines. It is important to know how many carbohydrates you can safely have in each meal. This is different for every person. Your dietitian can help you calculate how many carbohydrates you should have at each meal and for snack. Foods that contain carbohydrates include:  Bread, cereal, rice, pasta, and crackers.  Potatoes and corn.  Peas, beans, and lentils.  Milk and yogurt.  Fruit and juice.  Desserts, such as cakes, cookies, ice cream, and candy.  How does alcohol affect me? Alcohol can cause a sudden decrease in blood glucose (hypoglycemia), especially if you use insulin or take certain oral diabetes medicines. Hypoglycemia can be a life-threatening condition. Symptoms of hypoglycemia (sleepiness, dizziness, and confusion) are similar to symptoms of having too much alcohol. If your health care provider says that alcohol is safe for you, follow these guidelines:  Limit alcohol intake to no more than 1 drink per day for nonpregnant women and 2 drinks per day for men. One drink equals 12 oz of beer, 5 oz of wine, or 1 oz of hard liquor.  Do not drink on an empty stomach.  Keep yourself hydrated with water, diet soda, or unsweetened iced tea.  Keep in  mind that regular soda, juice, and other mixers may contain a lot of sugar and must be counted as carbohydrates.  What are tips for following this plan? Reading food labels  Start by checking the serving size on the label. The amount of calories, carbohydrates, fats, and other nutrients listed on the label are based on one serving of the food. Many foods contain more than  one serving per package.  Check the total grams (g) of carbohydrates in one serving. You can calculate the number of servings of carbohydrates in one serving by dividing the total carbohydrates by 15. For example, if a food has 30 g of total carbohydrates, it would be equal to 2 servings of carbohydrates.  Check the number of grams (g) of saturated and trans fats in one serving. Choose foods that have low or no amount of these fats.  Check the number of milligrams (mg) of sodium in one serving. Most people should limit total sodium intake to less than 2,300 mg per day.  Always check the nutrition information of foods labeled as "low-fat" or "nonfat". These foods may be higher in added sugar or refined carbohydrates and should be avoided.  Talk to your dietitian to identify your daily goals for nutrients listed on the label. Shopping  Avoid buying canned, premade, or processed foods. These foods tend to be high in fat, sodium, and added sugar.  Shop around the outside edge of the grocery store. This includes fresh fruits and vegetables, bulk grains, fresh meats, and fresh dairy. Cooking  Use low-heat cooking methods, such as baking, instead of high-heat cooking methods like deep frying.  Cook using healthy oils, such as olive, canola, or sunflower oil.  Avoid cooking with butter, cream, or high-fat meats. Meal planning  Eat meals and snacks regularly, preferably at the same times every day. Avoid going long periods of time without eating.  Eat foods high in fiber, such as fresh fruits, vegetables, beans, and whole grains. Talk to your dietitian about how many servings of carbohydrates you can eat at each meal.  Eat 4-6 ounces of lean protein each day, such as lean meat, chicken, fish, eggs, or tofu. 1 ounce is equal to 1 ounce of meat, chicken, or fish, 1 egg, or 1/4 cup of tofu.  Eat some foods each day that contain healthy fats, such as avocado, nuts, seeds, and  fish. Lifestyle   Check your blood glucose regularly.  Exercise at least 30 minutes 5 or more days each week, or as told by your health care provider.  Take medicines as told by your health care provider.  Do not use any products that contain nicotine or tobacco, such as cigarettes and e-cigarettes. If you need help quitting, ask your health care provider.  Work with a Social worker or diabetes educator to identify strategies to manage stress and any emotional and social challenges. What are some questions to ask my health care provider?  Do I need to meet with a diabetes educator?  Do I need to meet with a dietitian?  What number can I call if I have questions?  When are the best times to check my blood glucose? Where to find more information:  American Diabetes Association: diabetes.org/food-and-fitness/food  Academy of Nutrition and Dietetics: PokerClues.dk  [employer] of Diabetes and Digestive and Kidney Diseases (NIH): ContactWire.be Summary  A healthy meal plan will help you control your blood glucose and maintain a healthy lifestyle.  Working with a diet and nutrition specialist (dietitian) can  help you make a meal plan that is best for you.  Keep in mind that carbohydrates and alcohol have immediate effects on your blood glucose levels. It is important to count carbohydrates and to use alcohol carefully. This information is not intended to replace advice given to you by your health care provider. Make sure you discuss any questions you have with your health care provider. Document Released: 02/09/2005 Document Revised: 06/19/2016 Document Reviewed: 06/19/2016 Elsevier Interactive Patient Education  [employer].

## 2017-09-27 ENCOUNTER — Encounter: Payer: Self-pay | Admitting: Physician Assistant

## 2017-09-27 DIAGNOSIS — G4700 Insomnia, unspecified: Secondary | ICD-10-CM

## 2017-09-27 DIAGNOSIS — Z6828 Body mass index (BMI) 28.0-28.9, adult: Secondary | ICD-10-CM | POA: Insufficient documentation

## 2017-09-28 ENCOUNTER — Other Ambulatory Visit: Payer: Self-pay | Admitting: Physician Assistant

## 2017-09-28 DIAGNOSIS — F19982 Other psychoactive substance use, unspecified with psychoactive substance-induced sleep disorder: Secondary | ICD-10-CM

## 2017-09-28 MED ORDER — DOXEPIN HCL 3 MG PO TABS: 3.0000 mg | ORAL_TABLET | Freq: Every evening | ORAL | 2 refills | Status: DC | PRN

## 2017-10-02 ENCOUNTER — Encounter: Payer: Self-pay | Admitting: Physician Assistant

## 2017-10-03 LAB — HM DIABETES EYE EXAM

## 2017-10-03 MED ORDER — ZOLPIDEM TARTRATE 5 MG PO TABS: 5.0000 mg | ORAL_TABLET | Freq: Every evening | ORAL | 0 refills | Status: DC | PRN

## 2017-10-08 ENCOUNTER — Telehealth: Payer: Self-pay | Admitting: Internal Medicine

## 2017-10-08 ENCOUNTER — Inpatient Hospital Stay: Payer: Medicare Other

## 2017-10-08 ENCOUNTER — Inpatient Hospital Stay: Payer: Medicare Other | Attending: Internal Medicine | Admitting: Internal Medicine

## 2017-10-08 ENCOUNTER — Encounter: Payer: Self-pay | Admitting: Internal Medicine

## 2017-10-08 DIAGNOSIS — E279 Disorder of adrenal gland, unspecified: Secondary | ICD-10-CM | POA: Diagnosis not present

## 2017-10-08 DIAGNOSIS — C3411 Malignant neoplasm of upper lobe, right bronchus or lung: Secondary | ICD-10-CM | POA: Diagnosis not present

## 2017-10-08 DIAGNOSIS — E119 Type 2 diabetes mellitus without complications: Secondary | ICD-10-CM | POA: Insufficient documentation

## 2017-10-08 DIAGNOSIS — J90 Pleural effusion, not elsewhere classified: Secondary | ICD-10-CM | POA: Diagnosis not present

## 2017-10-08 DIAGNOSIS — Z5112 Encounter for antineoplastic immunotherapy: Secondary | ICD-10-CM | POA: Insufficient documentation

## 2017-10-08 DIAGNOSIS — C3490 Malignant neoplasm of unspecified part of unspecified bronchus or lung: Secondary | ICD-10-CM

## 2017-10-08 DIAGNOSIS — C3491 Malignant neoplasm of unspecified part of right bronchus or lung: Secondary | ICD-10-CM

## 2017-10-08 DIAGNOSIS — L298 Other pruritus: Secondary | ICD-10-CM | POA: Insufficient documentation

## 2017-10-08 DIAGNOSIS — Z79899 Other long term (current) drug therapy: Secondary | ICD-10-CM | POA: Diagnosis not present

## 2017-10-08 DIAGNOSIS — R05 Cough: Secondary | ICD-10-CM | POA: Diagnosis not present

## 2017-10-08 LAB — CBC WITH DIFFERENTIAL (CANCER CENTER ONLY)
Basophils Absolute: 0.1 10*3/uL (ref 0.0–0.1)
Basophils Relative: 1 %
Eosinophils Absolute: 0.4 10*3/uL (ref 0.0–0.5)
Eosinophils Relative: 4 %
HEMATOCRIT: 40.7 % (ref 38.4–49.9)
Hemoglobin: 13.4 g/dL (ref 13.0–17.1)
LYMPHS ABS: 1.9 10*3/uL (ref 0.9–3.3)
LYMPHS PCT: 20 %
MCH: 29.3 pg (ref 27.2–33.4)
MCHC: 32.9 g/dL (ref 32.0–36.0)
MCV: 88.9 fL (ref 79.3–98.0)
MONO ABS: 0.7 10*3/uL (ref 0.1–0.9)
MONOS PCT: 8 %
NEUTROS ABS: 6.4 10*3/uL (ref 1.5–6.5)
Neutrophils Relative %: 67 %
Platelet Count: 239 10*3/uL (ref 140–400)
RBC: 4.58 MIL/uL (ref 4.20–5.82)
RDW: 13.6 % (ref 11.0–14.6)
Smear Review: 1
WBC Count: 9.5 10*3/uL (ref 4.0–10.3)

## 2017-10-08 LAB — CMP (CANCER CENTER ONLY)
ALT: 14 U/L (ref 0–55)
ANION GAP: 8 (ref 3–11)
AST: 15 U/L (ref 5–34)
Albumin: 3.3 g/dL — ABNORMAL LOW (ref 3.5–5.0)
Alkaline Phosphatase: 70 U/L (ref 40–150)
BILIRUBIN TOTAL: 0.5 mg/dL (ref 0.2–1.2)
BUN: 12 mg/dL (ref 7–26)
CO2: 28 mmol/L (ref 22–29)
Calcium: 9.4 mg/dL (ref 8.4–10.4)
Chloride: 101 mmol/L (ref 98–109)
Creatinine: 0.78 mg/dL (ref 0.70–1.30)
GFR, Est AFR Am: >60 mL/min (ref >60)
Glucose, Bld: 128 mg/dL (ref 70–140)
POTASSIUM: 4.3 mmol/L (ref 3.5–5.1)
Sodium: 137 mmol/L (ref 136–145)
TOTAL PROTEIN: 7.3 g/dL (ref 6.4–8.3)

## 2017-10-08 MED ORDER — SODIUM CHLORIDE 0.9 % IV SOLN
Freq: Once | INTRAVENOUS | Status: AC
  Administered 2017-10-08: 12:00:00 via INTRAVENOUS

## 2017-10-08 MED ORDER — HYDROCOD POLST-CPM POLST ER 10-8 MG/5ML PO SUER: 5.0000 mL | Freq: Two times a day (BID) | ORAL | 0 refills | Status: DC | PRN

## 2017-10-08 MED ORDER — SODIUM CHLORIDE 0.9 % IV SOLN
10.6000 mg/kg | Freq: Once | INTRAVENOUS | Status: AC
  Administered 2017-10-08: 980 mg via INTRAVENOUS
  Filled 2017-10-08: qty 10

## 2017-10-08 NOTE — Progress Notes (Signed)
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  PRIOR THERAPY: A course of concurrent chemoradiation with weekly carboplatin for AUC of 2 and paclitaxel 45 mg/M2.Status post6cycles.  CURRENT THERAPY: Consolidation immunotherapy with Imfinzi given every 2 weeks. First dose 07/30/2017.Status post5cycles.  INTERVAL HISTORY: Randy [name] 72 y.o. male returns to the clinic today for follow-up visit accompanied by his wife.  The patient is feeling fine today with no specific complaints except for cough.  He is requesting refill of Tussionex.  He denied having any chest pain, shortness of breath or hemoptysis.  He denied having any fever or chills.  He has no nausea, vomiting, diarrhea or constipation.  He continues to tolerate his treatment with immunotherapy fairly well.  He is here for evaluation before starting cycle #6.  MEDICAL HISTORY: Past Medical History:  Diagnosis Date  . 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 Repeat US due 2023  . Carotid arterial disease ([facility])    a. S/p R CEA;  b. 05/2015 Carotid U/S: RICA 6-59%, LICA 93-57%.  . Coronary artery disease    a. 2012 MI in [place] w/ PCI  //  b. 04/2013 MV: inflat scar, no ischemia.  //  c.   NSTEMI 5/17 >> Promus DES to LCx and POBA to OM1 (ISR).   . Diabetes mellitus without complication ([facility])   . Grade II diastolic dysfunction 01/77/9390  . History of cardiac catheterization 10/21/15   a. NSTEMI 5/17: oLAD 40, pLCx 90 (Promus DES), OM2 90 ISR (POBA), oRCA 25   . History of  echocardiogram    a. Mild LVH, EF 50-55%, possible inferolateral HK, grade 2 diastolic dysfunction, trivial AI, MAC  . History of nuclear stress test 04/2013   a. Myoview 12/14: Intermediate risk, prior inferolateral MI, no ischemia, EF 45%  . Hx of leukocytosis   . Hyperlipidemia   . Hypertension   . Hypertensive heart disease   . Morbid obesity ([facility])   . Tobacco abuse     ALLERGIES:  is allergic to brilinta [ticagrelor].  MEDICATIONS:  Current Outpatient Medications  Medication Sig Dispense Refill  . aspirin EC 81 MG tablet Take 81 mg by mouth daily.    [name] atorvastatin (LIPITOR) 40 MG tablet TAKE 1 TABLET BY MOUTH EVERY DAY 90 tablet 3  . chlorpheniramine-HYDROcodone (TUSSIONEX) 10-8 MG/5ML SUER Take 5 mLs by mouth every 12 (twelve) hours as needed for cough. 120 mL 0  . clopidogrel (PLAVIX) 75 MG tablet Take 1 tablet (75 mg total) by mouth daily. 90 tablet 3  . lisinopril (PRINIVIL,ZESTRIL) 5 MG tablet Take 1 tablet (5 mg total) by mouth daily. 90 tablet 3  . metFORMIN (GLUCOPHAGE) 500 MG tablet Take 0.5 tablets (250 mg total) by mouth 2 (two) times daily with a meal. 180 tablet 1  . metoprolol succinate (TOPROL-XL) 25 MG 24 hr tablet Take 1 tablet (25 mg total) by mouth 3 (three) times daily. Take 2 in morning, 1 in PM 270 tablet 3  . VENTOLIN HFA 108 (90 Base) MCG/ACT inhaler Inhale 1 puff every 4 (four) hours as needed into the lungs for  wheezing or shortness of breath. 1 Inhaler 5  . zolpidem (AMBIEN) 5 MG tablet Take 1 tablet (5 mg total) by mouth at bedtime as needed for sleep. (Patient not taking: Reported on 10/08/2017) 30 tablet 0   No current facility-administered medications for this visit.     SURGICAL HISTORY:  Past Surgical History:  Procedure Laterality Date  . CARDIAC CATHETERIZATION    . CARDIAC CATHETERIZATION N/A 10/21/2015   Procedure: Left Heart Cath and Coronary Angiography;  Surgeon: [name], MD;  Location: [facility];  Service:  Cardiovascular;  Laterality: N/A;  . CARDIAC CATHETERIZATION N/A 10/21/2015   Procedure: Coronary Stent Intervention;  Surgeon: [name], MD;  Location: [facility];  Service: Cardiovascular;  Laterality: N/A;  . CARDIAC CATHETERIZATION N/A 10/21/2015   Procedure: Coronary Balloon Angioplasty;  Surgeon: [name], MD;  Location: [facility];  Service: Cardiovascular;  Laterality: N/A;  . CAROTID ENDARTERECTOMY    . CORONARY ANGIOPLASTY    . CORONARY STENT PLACEMENT    . VIDEO BRONCHOSCOPY Bilateral 04/03/2017   Procedure: VIDEO BRONCHOSCOPY WITH FLUORO;  Surgeon: [name], MD;  Location: [facility];  Service: Cardiopulmonary;  Laterality: Bilateral;    REVIEW OF SYSTEMS:  A comprehensive review of systems was negative except for: Respiratory: positive for cough   PHYSICAL EXAMINATION: General appearance: alert, cooperative and no distress Head: Normocephalic, without obvious abnormality, atraumatic Neck: no adenopathy, no JVD, supple, symmetrical, trachea midline and thyroid not enlarged, symmetric, no tenderness/mass/nodules Lymph nodes: Cervical, supraclavicular, and axillary nodes normal. Resp: clear to auscultation bilaterally Back: symmetric, no curvature. ROM normal. No CVA tenderness. Cardio: regular rate and rhythm, S1, S2 normal, no murmur, click, rub or gallop GI: soft, non-tender; bowel sounds normal; no masses,  no organomegaly Extremities: extremities normal, atraumatic, no cyanosis or edema  ECOG PERFORMANCE STATUS: 1 - Symptomatic but completely ambulatory  Blood pressure (!) 144/81, pulse 84, temperature 97.7 F (36.5 C), temperature source Oral, resp. rate 20, height _0  (1.803 m), weight 206 lb 8 oz (93.7 kg), SpO2 97 %.  LABORATORY DATA: Lab Results  Component Value Date   WBC 9.5 10/08/2017   HGB 13.4 10/08/2017   HCT 40.7 10/08/2017   MCV 88.9 10/08/2017   PLT 239 10/08/2017      Chemistry      Component  Value Date/Time   NA 137 10/08/2017 1043   NA 133 (L) 05/28/2017 0932   K 4.3 10/08/2017 1043   K 4.4 05/28/2017 0932   CL 101 10/08/2017 1043   CO2 28 10/08/2017 1043   CO2 24 05/28/2017 0932   BUN 12 10/08/2017 1043   BUN 9.3 05/28/2017 0932   CREATININE 0.78 10/08/2017 1043   CREATININE 0.8 05/28/2017 0932      Component Value Date/Time   CALCIUM 9.4 10/08/2017 1043   CALCIUM 8.5 05/28/2017 0932   ALKPHOS 70 10/08/2017 1043   ALKPHOS 68 05/28/2017 0932   AST 15 10/08/2017 1043   AST 13 05/28/2017 0932   ALT 14 10/08/2017 1043   ALT 16 05/28/2017 0932   BILITOT 0.5 10/08/2017 1043   BILITOT 0.40 05/28/2017 0932       RADIOGRAPHIC STUDIES: No results found.  ASSESSMENT AND PLAN: This is a very pleasant 72 years old white male recently diagnosed with a stage IIIA/B non-small cell lung cancer, squamous cell carcinoma status post concurrent chemoradiation with weekly carboplatin and paclitaxel with partial response.  He is currently undergoing treatment  with consolidation immunotherapy with Imfinzi (Durvalumab) status post 5 cycles.  He has been tolerating this treatment well with no concerning complaints. I recommended for the patient to proceed with cycle #6 today as a scheduled. I will see him back for follow-up visit in 2 weeks for evaluation after repeating CT scan of the chest for restaging of his disease. He was advised to call immediately if he has any concerning symptoms in the interval. The patient voices understanding of current disease status and treatment options and is in agreement with the current care plan.  All questions were answered. The patient knows to call the clinic with any problems, questions or concerns. We can certainly see the patient much sooner if necessary.  I spent 10 minutes counseling the patient face to face. The total time spent in the appointment was 15 minutes.  Disclaimer: This note was dictated with voice recognition software. Similar  sounding words can inadvertently be transcribed and may not be corrected upon review.

## 2017-10-08 NOTE — Telephone Encounter (Signed)
3 cycles already scheduled per 5/13 los - gave patient AVS and calender per los . Central radiology to contact patient with appt for scan.,

## 2017-10-08 NOTE — Patient Instructions (Signed)
[facility] Discharge Instructions for Patients Receiving Chemotherapy  Today you received the following chemotherapy agents imfinzi   To help prevent nausea and vomiting after your treatment, we encourage you to take your nausea medication as directed  If you develop nausea and vomiting that is not controlled by your nausea medication, call the clinic.   BELOW ARE SYMPTOMS THAT SHOULD BE REPORTED IMMEDIATELY:  *FEVER GREATER THAN 100.5 F  *CHILLS WITH OR WITHOUT FEVER  NAUSEA AND VOMITING THAT IS NOT CONTROLLED WITH YOUR NAUSEA MEDICATION  *UNUSUAL SHORTNESS OF BREATH  *UNUSUAL BRUISING OR BLEEDING  TENDERNESS IN MOUTH AND THROAT WITH OR WITHOUT PRESENCE OF ULCERS  *URINARY PROBLEMS  *BOWEL PROBLEMS  UNUSUAL RASH Items with * indicate a potential emergency and should be followed up as soon as possible.  Feel free to call the clinic you have any questions or concerns. The clinic phone number is [phone].

## 2017-10-19 ENCOUNTER — Ambulatory Visit (HOSPITAL_COMMUNITY)
Admission: RE | Admit: 2017-10-19 | Discharge: 2017-10-19 | Disposition: A | Discharge status: Home / Self Care | Payer: Medicare Other | Source: Ambulatory Visit | Attending: Internal Medicine | Admitting: Internal Medicine

## 2017-10-19 DIAGNOSIS — C7970 Secondary malignant neoplasm of unspecified adrenal gland: Secondary | ICD-10-CM | POA: Insufficient documentation

## 2017-10-19 DIAGNOSIS — I2510 Atherosclerotic heart disease of native coronary artery without angina pectoris: Secondary | ICD-10-CM | POA: Insufficient documentation

## 2017-10-19 DIAGNOSIS — J439 Emphysema, unspecified: Secondary | ICD-10-CM | POA: Diagnosis not present

## 2017-10-19 DIAGNOSIS — C3490 Malignant neoplasm of unspecified part of unspecified bronchus or lung: Secondary | ICD-10-CM | POA: Diagnosis not present

## 2017-10-19 DIAGNOSIS — I700 Atherosclerosis of aorta: Secondary | ICD-10-CM | POA: Diagnosis not present

## 2017-10-19 DIAGNOSIS — J90 Pleural effusion, not elsewhere classified: Secondary | ICD-10-CM | POA: Diagnosis not present

## 2017-10-19 MED ORDER — IOPAMIDOL (ISOVUE-300) INJECTION 61%
75.0000 mL | Freq: Once | INTRAVENOUS | Status: AC | PRN
  Administered 2017-10-19: 75 mL via INTRAVENOUS

## 2017-10-23 ENCOUNTER — Telehealth: Payer: Self-pay | Admitting: Nurse Practitioner

## 2017-10-23 ENCOUNTER — Inpatient Hospital Stay (HOSPITAL_BASED_OUTPATIENT_CLINIC_OR_DEPARTMENT_OTHER): Payer: Medicare Other | Admitting: Nurse Practitioner

## 2017-10-23 ENCOUNTER — Inpatient Hospital Stay: Payer: Medicare Other

## 2017-10-23 ENCOUNTER — Encounter: Payer: Self-pay | Admitting: Internal Medicine

## 2017-10-23 ENCOUNTER — Encounter: Payer: Self-pay | Admitting: Nurse Practitioner

## 2017-10-23 ENCOUNTER — Encounter: Payer: Self-pay | Admitting: Physician Assistant

## 2017-10-23 VITALS — BP 142/91 | HR 75 | Temp 97.9°F | Resp 18 | Ht 71.0 in | Wt 203.2 lb

## 2017-10-23 DIAGNOSIS — C3411 Malignant neoplasm of upper lobe, right bronchus or lung: Secondary | ICD-10-CM

## 2017-10-23 DIAGNOSIS — J90 Pleural effusion, not elsewhere classified: Secondary | ICD-10-CM

## 2017-10-23 DIAGNOSIS — C3491 Malignant neoplasm of unspecified part of right bronchus or lung: Secondary | ICD-10-CM

## 2017-10-23 DIAGNOSIS — E119 Type 2 diabetes mellitus without complications: Secondary | ICD-10-CM | POA: Diagnosis not present

## 2017-10-23 DIAGNOSIS — R05 Cough: Secondary | ICD-10-CM | POA: Diagnosis not present

## 2017-10-23 DIAGNOSIS — Z5112 Encounter for antineoplastic immunotherapy: Secondary | ICD-10-CM | POA: Diagnosis not present

## 2017-10-23 DIAGNOSIS — E279 Disorder of adrenal gland, unspecified: Secondary | ICD-10-CM | POA: Diagnosis not present

## 2017-10-23 DIAGNOSIS — R5383 Other fatigue: Secondary | ICD-10-CM

## 2017-10-23 LAB — CBC WITH DIFFERENTIAL (CANCER CENTER ONLY)
BASOS ABS: 0.1 10*3/uL (ref 0.0–0.1)
BASOS PCT: 1 %
EOS ABS: 0.4 10*3/uL (ref 0.0–0.5)
Eosinophils Relative: 4 %
HCT: 42.2 % (ref 38.4–49.9)
HEMOGLOBIN: 13.7 g/dL (ref 13.0–17.1)
Lymphocytes Relative: 16 %
Lymphs Abs: 1.7 10*3/uL (ref 0.9–3.3)
MCH: 28.3 pg (ref 27.2–33.4)
MCHC: 32.4 g/dL (ref 32.0–36.0)
MCV: 87.4 fL (ref 79.3–98.0)
MONOS PCT: 10 %
Monocytes Absolute: 1 10*3/uL — ABNORMAL HIGH (ref 0.1–0.9)
NEUTROS PCT: 69 %
Neutro Abs: 7.1 10*3/uL — ABNORMAL HIGH (ref 1.5–6.5)
Platelet Count: 277 10*3/uL (ref 140–400)
RBC: 4.83 MIL/uL (ref 4.20–5.82)
RDW: 14.1 % (ref 11.0–14.6)
WBC: 10.3 10*3/uL (ref 4.0–10.3)

## 2017-10-23 LAB — CMP (CANCER CENTER ONLY)
ALK PHOS: 77 U/L (ref 40–150)
ALT: 13 U/L (ref 0–55)
ANION GAP: 10 (ref 3–11)
AST: 15 U/L (ref 5–34)
Albumin: 3.4 g/dL — ABNORMAL LOW (ref 3.5–5.0)
BILIRUBIN TOTAL: 0.6 mg/dL (ref 0.2–1.2)
BUN: 11 mg/dL (ref 7–26)
CALCIUM: 9.6 mg/dL (ref 8.4–10.4)
CO2: 25 mmol/L (ref 22–29)
CREATININE: 0.76 mg/dL (ref 0.70–1.30)
Chloride: 99 mmol/L (ref 98–109)
Glucose, Bld: 93 mg/dL (ref 70–140)
Potassium: 4.7 mmol/L (ref 3.5–5.1)
Sodium: 134 mmol/L — ABNORMAL LOW (ref 136–145)
TOTAL PROTEIN: 7.8 g/dL (ref 6.4–8.3)

## 2017-10-23 LAB — TSH: TSH: 3.152 u[IU]/mL (ref 0.320–4.118)

## 2017-10-23 MED ORDER — HYDROCOD POLST-CPM POLST ER 10-8 MG/5ML PO SUER: 5.0000 mL | Freq: Two times a day (BID) | ORAL | 0 refills | Status: DC | PRN

## 2017-10-23 NOTE — Progress Notes (Signed)
  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  PRIOR THERAPY:A course of concurrent chemoradiation with weekly carboplatin for AUC of 2 and paclitaxel 45 mg/M2.Status post6cycles.  CURRENT THERAPY:Consolidation immunotherapy with Imfinzi given every 2 weeks. First dose 07/30/2017.Status post6cycles.      INTERVAL HISTORY:   Mr. Bedwell returns as scheduled.  He completed cycle 6 Imfinzi on 10/08/2017.  He denies nausea/vomiting.  No mouth sores.  No diarrhea.  No rash.  No significant shortness of breath.  He continues to have a cough, mainly at nighttime.  Objective:  Vital signs in last 24 hours:  Blood pressure (!) 142/91, pulse 75, temperature 97.9 F (36.6 C), temperature source Oral, resp. rate 18, height 5\' 11"  (1.803 m), weight 203 lb 3.2 oz (92.2 kg), SpO2 98 %.    HEENT: No thrush or ulcers. Resp: Lungs clear bilaterally. Cardio: Regular rate and rhythm. GI: Abdomen soft and nontender.  No hepatomegaly. Vascular: No leg edema. Neuro: Alert and oriented. Skin: No rash.   Lab Results:  Lab Results  Component Value Date   WBC 10.3 10/23/2017   HGB 13.7 10/23/2017   HCT 42.2 10/23/2017   MCV 87.4 10/23/2017   PLT 277 10/23/2017   NEUTROABS 7.1 (H) 10/23/2017    Imaging:  No results found.  Medications: I have reviewed the patient's current medications.  Assessment/Plan: 1. Stage IIIa/B non-small cell lung cancer, squamous cell carcinoma, status post concurrent chemoradiation with weekly carboplatin and Taxol with a partial response.  Currently undergoing treatment with consolidation immunotherapy with Imfinzi.  Six cycles completed to date.  Recent  restaging CT evaluation shows decrease in the mass within the paramediastinal right upper lobe, new right pleural effusion and increase in the size of a right adrenal gland nodule.  Disposition: Mr. Mainwaring has completed 6 cycles of Imfinzi.  The recent restaging CT evaluation shows a new right pleural effusion and increase in the size of a right adrenal gland nodule.  I reviewed the CT results with Dr. [name] by phone.  Dr. Worthy Flank recommendation is to hold today's treatment.  Mr. Lepage will return for a follow-up visit with Dr. [name] 10/25/2017 for additional recommendations.  He will contact the office in the interim with any problems.    Ned Card ANP/GNP-BC   10/23/2017  2:30 PM

## 2017-10-23 NOTE — Telephone Encounter (Signed)
Appointments scheduled patient declined AVS/Calendar per 5/28 los

## 2017-10-24 ENCOUNTER — Encounter: Payer: Self-pay | Admitting: Physician Assistant

## 2017-10-25 ENCOUNTER — Encounter: Payer: Self-pay | Admitting: Internal Medicine

## 2017-10-25 ENCOUNTER — Inpatient Hospital Stay (HOSPITAL_BASED_OUTPATIENT_CLINIC_OR_DEPARTMENT_OTHER): Payer: Medicare Other | Admitting: Internal Medicine

## 2017-10-25 VITALS — BP 126/73 | HR 94 | Temp 97.9°F | Resp 18 | Ht 71.0 in | Wt 199.4 lb

## 2017-10-25 DIAGNOSIS — E279 Disorder of adrenal gland, unspecified: Secondary | ICD-10-CM | POA: Diagnosis not present

## 2017-10-25 DIAGNOSIS — E119 Type 2 diabetes mellitus without complications: Secondary | ICD-10-CM | POA: Diagnosis not present

## 2017-10-25 DIAGNOSIS — C3411 Malignant neoplasm of upper lobe, right bronchus or lung: Secondary | ICD-10-CM

## 2017-10-25 DIAGNOSIS — R05 Cough: Secondary | ICD-10-CM | POA: Diagnosis not present

## 2017-10-25 DIAGNOSIS — Z5112 Encounter for antineoplastic immunotherapy: Secondary | ICD-10-CM

## 2017-10-25 DIAGNOSIS — C3491 Malignant neoplasm of unspecified part of right bronchus or lung: Secondary | ICD-10-CM

## 2017-10-25 DIAGNOSIS — J90 Pleural effusion, not elsewhere classified: Secondary | ICD-10-CM | POA: Diagnosis not present

## 2017-10-25 NOTE — Progress Notes (Signed)
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  PRIOR THERAPY: A course of concurrent chemoradiation with weekly carboplatin for AUC of 2 and paclitaxel 45 mg/M2.Status post6cycles.  CURRENT THERAPY: Consolidation immunotherapy with Imfinzi given every 2 weeks. First dose 07/30/2017.Status post6cycles.  INTERVAL HISTORY: Randy [name] 72 y.o. male returns to the clinic today for follow-up visit accompanied by his wife.  The patient has no complaints today.  He denied having any chest pain, shortness breath, cough or hemoptysis.  He denied having any fever or chills.  He has no nausea, vomiting, diarrhea or constipation.  He denied having any weight loss or night sweats.  He continues to tolerate his treatment with Imfinzi (Durvalumab) fairly well.  The patient had repeat CT scan of the chest performed recently and he is here for evaluation and discussion of his discuss results.  MEDICAL HISTORY: Past Medical History:  Diagnosis Date  . 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 Repeat US due 2023  . Carotid arterial disease ([facility])    a. S/p R CEA;  b. 05/2015 Carotid U/S: RICA 3-24%, LICA 40-10%.  . Coronary artery disease    a. 2012 MI in [place] w/ PCI  //  b. 04/2013 MV: inflat scar, no ischemia.  //  c.   NSTEMI 5/17 >> Promus DES to LCx and POBA to OM1 (ISR).   . Diabetes mellitus without complication ([facility])   . Grade II diastolic dysfunction 27/25/3664  . History of cardiac catheterization 10/21/15   a. NSTEMI 5/17: oLAD 40, pLCx 90 (Promus  DES), OM2 90 ISR (POBA), oRCA 25   . History of echocardiogram    a. Mild LVH, EF 50-55%, possible inferolateral HK, grade 2 diastolic dysfunction, trivial AI, MAC  . History of nuclear stress test 04/2013   a. Myoview 12/14: Intermediate risk, prior inferolateral MI, no ischemia, EF 45%  . Hx of leukocytosis   . Hyperlipidemia   . Hypertension   . Hypertensive heart disease   . Morbid obesity ([facility])   . Tobacco abuse     ALLERGIES:  is allergic to brilinta [ticagrelor].  MEDICATIONS:  Current Outpatient Medications  Medication Sig Dispense Refill  . aspirin EC 81 MG tablet Take 81 mg by mouth daily.    [name] atorvastatin (LIPITOR) 40 MG tablet TAKE 1 TABLET BY MOUTH EVERY DAY 90 tablet 3  . chlorpheniramine-HYDROcodone (TUSSIONEX) 10-8 MG/5ML SUER Take 5 mLs by mouth every 12 (twelve) hours as needed for cough. 120 mL 0  . clopidogrel (PLAVIX) 75 MG tablet Take 1 tablet (75 mg total) by mouth daily. 90 tablet 3  . lisinopril (PRINIVIL,ZESTRIL) 5 MG tablet Take 1 tablet (5 mg total) by mouth daily. 90 tablet 3  . metFORMIN (GLUCOPHAGE) 500 MG tablet Take 0.5 tablets (250 mg total) by mouth 2 (two) times daily with a meal. 180 tablet 1  . metoprolol succinate (TOPROL-XL) 25 MG 24 hr tablet Take 1 tablet (25 mg total) by mouth 3 (three) times daily. Take 2 in morning, 1 in PM 270 tablet 3  . VENTOLIN HFA 108 (90 Base) MCG/ACT inhaler Inhale 1  puff every 4 (four) hours as needed into the lungs for wheezing or shortness of breath. 1 Inhaler 5  . zolpidem (AMBIEN) 5 MG tablet Take 1 tablet (5 mg total) by mouth at bedtime as needed for sleep. 30 tablet 0   No current facility-administered medications for this visit.     SURGICAL HISTORY:  Past Surgical History:  Procedure Laterality Date  . CARDIAC CATHETERIZATION    . CARDIAC CATHETERIZATION N/A 10/21/2015   Procedure: Left Heart Cath and Coronary Angiography;  Surgeon: [name], MD;  Location: [facility];  Service:  Cardiovascular;  Laterality: N/A;  . CARDIAC CATHETERIZATION N/A 10/21/2015   Procedure: Coronary Stent Intervention;  Surgeon: [name], MD;  Location: [facility];  Service: Cardiovascular;  Laterality: N/A;  . CARDIAC CATHETERIZATION N/A 10/21/2015   Procedure: Coronary Balloon Angioplasty;  Surgeon: [name], MD;  Location: [facility];  Service: Cardiovascular;  Laterality: N/A;  . CAROTID ENDARTERECTOMY    . CORONARY ANGIOPLASTY    . CORONARY STENT PLACEMENT    . VIDEO BRONCHOSCOPY Bilateral 04/03/2017   Procedure: VIDEO BRONCHOSCOPY WITH FLUORO;  Surgeon: [name], MD;  Location: [facility];  Service: Cardiopulmonary;  Laterality: Bilateral;    REVIEW OF SYSTEMS:  Constitutional: negative Eyes: negative Ears, nose, mouth, throat, and face: negative Respiratory: negative Cardiovascular: negative Gastrointestinal: negative Genitourinary:negative Integument/breast: negative Hematologic/lymphatic: negative Musculoskeletal:negative Neurological: negative Behavioral/Psych: negative Endocrine: negative Allergic/Immunologic: negative   PHYSICAL EXAMINATION: General appearance: alert, cooperative and no distress Head: Normocephalic, without obvious abnormality, atraumatic Neck: no adenopathy, no JVD, supple, symmetrical, trachea midline and thyroid not enlarged, symmetric, no tenderness/mass/nodules Lymph nodes: Cervical, supraclavicular, and axillary nodes normal. Resp: clear to auscultation bilaterally Back: symmetric, no curvature. ROM normal. No CVA tenderness. Cardio: regular rate and rhythm, S1, S2 normal, no murmur, click, rub or gallop GI: soft, non-tender; bowel sounds normal; no masses,  no organomegaly Extremities: extremities normal, atraumatic, no cyanosis or edema Neurologic: Alert and oriented X 3, normal strength and tone. Normal symmetric reflexes. Normal coordination and gait  ECOG PERFORMANCE STATUS: 1 - Symptomatic  but completely ambulatory  Blood pressure 126/73, pulse 94, temperature 97.9 F (36.6 C), temperature source Oral, resp. rate 18, height _0  (1.803 m), weight 199 lb 6.4 oz (90.4 kg), SpO2 97 %.  LABORATORY DATA: Lab Results  Component Value Date   WBC 10.3 10/23/2017   HGB 13.7 10/23/2017   HCT 42.2 10/23/2017   MCV 87.4 10/23/2017   PLT 277 10/23/2017      Chemistry      Component Value Date/Time   NA 134 (L) 10/23/2017 1343   NA 133 (L) 05/28/2017 0932   K 4.7 10/23/2017 1343   K 4.4 05/28/2017 0932   CL 99 10/23/2017 1343   CO2 25 10/23/2017 1343   CO2 24 05/28/2017 0932   BUN 11 10/23/2017 1343   BUN 9.3 05/28/2017 0932   CREATININE 0.76 10/23/2017 1343   CREATININE 0.8 05/28/2017 0932      Component Value Date/Time   CALCIUM 9.6 10/23/2017 1343   CALCIUM 8.5 05/28/2017 0932   ALKPHOS 77 10/23/2017 1343   ALKPHOS 68 05/28/2017 0932   AST 15 10/23/2017 1343   AST 13 05/28/2017 0932   ALT 13 10/23/2017 1343   ALT 16 05/28/2017 0932   BILITOT 0.6 10/23/2017 1343   BILITOT 0.40 05/28/2017 0932       RADIOGRAPHIC STUDIES: Ct Chest W Contrast  Result Date: 10/19/2017  CLINICAL DATA:  Followup lung cancer EXAM: CT CHEST WITH CONTRAST TECHNIQUE: Multidetector CT imaging of the chest was performed during intravenous contrast administration. CONTRAST:  109m ISOVUE-300 IOPAMIDOL (ISOVUE-300) INJECTION 61% COMPARISON:  07/16/2017. FINDINGS: Cardiovascular: Heart size appears normal. There is aortic atherosclerosis. Calcification in the RCA, LAD, left main and left circumflex coronary arteries noted. Mediastinum/Nodes: Normal appearance of the thyroid gland. The trachea appears patent and is midline. Normal appearance of the esophagus. No supraclavicular or axillary adenopathy. No enlarged mediastinal or hilar lymph nodes. Lungs/Pleura: Moderate changes of emphysema. There is a new small to moderate right pleural effusion. There is progressive interstitial thickening within  the overlying right lower, nonspecific. Right upper lobe lung mass invading the mediastinum measures 3.5 x 3.3 x 1.5 cm (volume = 9.1 cm^3) On the previous exam this measured 3.8 by 4.2 by 2.3 cm (volume = 19 cm^3). Surrounding fibrosis and masslike architectural distortion is similar to previous exam and is likely to reflect changes due to external beam radiation Upper Abdomen: Enlargement of right adrenal gland which now measures 3.5 x 4.9 cm, image 137/2. This is compared with 2.5 x 1.3 cm. Normal appearance of the left adrenal gland. Musculoskeletal: No chest wall abnormality. No acute or significant osseous findings. IMPRESSION: 1. There has been decrease in size of invasive mass within the paramediastinal right upper lobe. 2. New right pleural effusion with progressive interstitial thickening in the right lower lobe. Nonspecific. Favor inflammatory or infectious etiologies. 3. 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 4. Aortic Atherosclerosis (ICD10-I70.0) and Emphysema (ICD10-J43.9). 5. Three vessel coronary artery atherosclerotic calcifications including left main disease. Electronically Signed   By: TKerby MoorsM.D.   On: 10/19/2017 16:43    ASSESSMENT AND PLAN: This is a very pleasant 72years old white male recently diagnosed with a stage IIIA/B non-small cell lung cancer, squamous cell carcinoma status post concurrent chemoradiation with weekly carboplatin and paclitaxel with partial response.  He is currently undergoing treatment with consolidation immunotherapy with Imfinzi (Durvalumab) status post 6 cycles.  He has been tolerating this treatment well with no concerning complaints. Repeat CT scan of the chest showed decrease in the size of the paramediastinal right upper lobe lung mass but there was new small right pleural effusion with interstitial thickening in the right lower lobe that is nonspecific in favor inflammatory process.   Unfortunately there is increase in the size of right adrenal gland nodule suspicious for adrenal gland metastasis. I personally and independently reviewed the scan images and discussed the results and showed the images to the patient and his wife. I recommended for the patient to have CT-guided core biopsy of the right adrenal gland lesion to rule out metastatic disease to this area. I will hold his treatment with immunotherapy for now until after the biopsy results. I will see the patient back for follow-up visit in 2 weeks for reevaluation and more detailed discussion of his treatment options based on the biopsy results. He was advised to call immediately if he has any concerning symptoms in the interval. The patient voices understanding of current disease status and treatment options and is in agreement with the current care plan.  All questions were answered. The patient knows to call the clinic with any problems, questions or concerns. We can certainly see the patient much sooner if necessary.  Disclaimer: This note was dictated with voice recognition software. Similar sounding words can inadvertently be transcribed and may not be corrected  upon review.

## 2017-10-26 ENCOUNTER — Encounter: Payer: Self-pay | Admitting: Physician Assistant

## 2017-10-29 ENCOUNTER — Ambulatory Visit: Payer: Medicare Other | Admitting: Internal Medicine

## 2017-10-29 ENCOUNTER — Telehealth: Payer: Self-pay | Admitting: Internal Medicine

## 2017-10-29 ENCOUNTER — Telehealth: Payer: Self-pay | Admitting: Medical Oncology

## 2017-10-29 DIAGNOSIS — I2510 Atherosclerotic heart disease of native coronary artery without angina pectoris: Secondary | ICD-10-CM

## 2017-10-29 MED ORDER — METOPROLOL SUCCINATE ER 25 MG PO TB24: 25.0000 mg | ORAL_TABLET | Freq: Two times a day (BID) | ORAL | 3 refills | Status: DC

## 2017-10-29 NOTE — Telephone Encounter (Signed)
asking about ct biopsy. Central scheduling working on the appt.  Pt aware.

## 2017-10-29 NOTE — Telephone Encounter (Signed)
Per staff message from radiology scheduler to Dr. Harrington Challenger---  Fay Records, MD  [facility] Maser  Cc: Rodman Key, RN        OK to stop plavix 5 days prior   Resume after bx when safe  Pt will follow up in cardiology after

## 2017-10-29 NOTE — Telephone Encounter (Signed)
New Message:       Pt is calling to see if we have received any clearance papers from his Oncologist.

## 2017-10-29 NOTE — Telephone Encounter (Signed)
Fay Records, MD at 10/29/2017 7:12 AM   Status: Signed    Would recomm:  1.  With low BP cut back to 1 metoprolol in AM and PM    Follow BP and HR  2  Set up for echo   Last one was in 2017       Above notes from Dr. Harrington Challenger are in reply to patient mychart message sent. Reviewed with patient who verbalizes understanding of direction change for metoprolol succinate.  Echo scheduled for this Thursday.  Pt asked about clearance request form for adrenal biopsy that he is waiting to have scheduled.  Oncology/radiology will be scheduling for him once gets ok from Dr. Harrington Challenger. Advised I do not see a form in our office and that I would ask Dr. Harrington Challenger to be looking out for it.

## 2017-10-29 NOTE — Telephone Encounter (Signed)
Would recomm:  1.  With low BP cut back to 1 metoprolol in AM and PM    Follow BP and HR  2  Set up for echo   Last one was in 2017

## 2017-10-30 ENCOUNTER — Telehealth: Payer: Self-pay | Admitting: *Deleted

## 2017-10-30 NOTE — Telephone Encounter (Signed)
Pt called Bx scheduled for 6/13.  Message to scheduling to cancel 6/10 appt and r/s for after 6/13.

## 2017-10-31 ENCOUNTER — Telehealth: Payer: Self-pay | Admitting: Internal Medicine

## 2017-10-31 NOTE — Telephone Encounter (Signed)
Scheduled appt per 6/4 sch message - pt is aware of appt date and time.

## 2017-10-31 NOTE — Telephone Encounter (Signed)
Late entry for 10/29/17; 6pm Called pt back and made him aware that Dr. Harrington Challenger received message from radiology scheduling and has replied to it.  From: Randy [name]  Sent: 10/29/2017  8:35 AM  To: Fay Records, MD  Subject: PLAVIX                       Dr. Harrington Challenger,   Dr. [name] has requested a biopsy on this patient.   He will need to hold his Plavix 5 days prior to having this procedure.   Can he hold this bloodthinner?   Thank you,  [facility]  Radiology scheduler

## 2017-11-01 ENCOUNTER — Ambulatory Visit (HOSPITAL_COMMUNITY): Payer: Medicare Other | Attending: Cardiology

## 2017-11-01 ENCOUNTER — Other Ambulatory Visit: Payer: Self-pay

## 2017-11-01 DIAGNOSIS — I119 Hypertensive heart disease without heart failure: Secondary | ICD-10-CM | POA: Insufficient documentation

## 2017-11-01 DIAGNOSIS — Z720 Tobacco use: Secondary | ICD-10-CM | POA: Insufficient documentation

## 2017-11-01 DIAGNOSIS — E785 Hyperlipidemia, unspecified: Secondary | ICD-10-CM | POA: Insufficient documentation

## 2017-11-01 DIAGNOSIS — I340 Nonrheumatic mitral (valve) insufficiency: Secondary | ICD-10-CM | POA: Diagnosis not present

## 2017-11-01 DIAGNOSIS — Z85118 Personal history of other malignant neoplasm of bronchus and lung: Secondary | ICD-10-CM | POA: Insufficient documentation

## 2017-11-01 DIAGNOSIS — E119 Type 2 diabetes mellitus without complications: Secondary | ICD-10-CM | POA: Insufficient documentation

## 2017-11-01 DIAGNOSIS — I2510 Atherosclerotic heart disease of native coronary artery without angina pectoris: Secondary | ICD-10-CM | POA: Diagnosis not present

## 2017-11-01 DIAGNOSIS — I252 Old myocardial infarction: Secondary | ICD-10-CM | POA: Insufficient documentation

## 2017-11-01 MED ORDER — PERFLUTREN LIPID MICROSPHERE
1.0000 mL | INTRAVENOUS | Status: AC | PRN
  Administered 2017-11-01: 3 mL via INTRAVENOUS

## 2017-11-05 ENCOUNTER — Ambulatory Visit: Payer: Medicare Other | Admitting: Internal Medicine

## 2017-11-05 ENCOUNTER — Ambulatory Visit: Payer: Medicare Other

## 2017-11-05 ENCOUNTER — Other Ambulatory Visit: Payer: Medicare Other

## 2017-11-07 ENCOUNTER — Other Ambulatory Visit: Payer: Self-pay | Admitting: Radiology

## 2017-11-08 ENCOUNTER — Ambulatory Visit (HOSPITAL_COMMUNITY)
Admission: RE | Admit: 2017-11-08 | Discharge: 2017-11-08 | Disposition: A | Discharge status: Home / Self Care | Payer: Medicare Other | Source: Ambulatory Visit | Attending: Internal Medicine | Admitting: Internal Medicine

## 2017-11-08 ENCOUNTER — Encounter (HOSPITAL_COMMUNITY): Payer: Self-pay

## 2017-11-08 DIAGNOSIS — C3491 Malignant neoplasm of unspecified part of right bronchus or lung: Secondary | ICD-10-CM | POA: Diagnosis not present

## 2017-11-08 DIAGNOSIS — C7971 Secondary malignant neoplasm of right adrenal gland: Secondary | ICD-10-CM | POA: Insufficient documentation

## 2017-11-08 DIAGNOSIS — Z955 Presence of coronary angioplasty implant and graft: Secondary | ICD-10-CM | POA: Insufficient documentation

## 2017-11-08 DIAGNOSIS — E278 Other specified disorders of adrenal gland: Secondary | ICD-10-CM | POA: Diagnosis not present

## 2017-11-08 DIAGNOSIS — Z7984 Long term (current) use of oral hypoglycemic drugs: Secondary | ICD-10-CM | POA: Insufficient documentation

## 2017-11-08 DIAGNOSIS — Z87891 Personal history of nicotine dependence: Secondary | ICD-10-CM | POA: Insufficient documentation

## 2017-11-08 DIAGNOSIS — I252 Old myocardial infarction: Secondary | ICD-10-CM | POA: Diagnosis not present

## 2017-11-08 DIAGNOSIS — I2510 Atherosclerotic heart disease of native coronary artery without angina pectoris: Secondary | ICD-10-CM | POA: Diagnosis not present

## 2017-11-08 DIAGNOSIS — Z79899 Other long term (current) drug therapy: Secondary | ICD-10-CM | POA: Insufficient documentation

## 2017-11-08 DIAGNOSIS — I119 Hypertensive heart disease without heart failure: Secondary | ICD-10-CM | POA: Insufficient documentation

## 2017-11-08 DIAGNOSIS — Z7982 Long term (current) use of aspirin: Secondary | ICD-10-CM | POA: Diagnosis not present

## 2017-11-08 DIAGNOSIS — E119 Type 2 diabetes mellitus without complications: Secondary | ICD-10-CM | POA: Insufficient documentation

## 2017-11-08 DIAGNOSIS — E785 Hyperlipidemia, unspecified: Secondary | ICD-10-CM | POA: Diagnosis not present

## 2017-11-08 DIAGNOSIS — C3490 Malignant neoplasm of unspecified part of unspecified bronchus or lung: Secondary | ICD-10-CM | POA: Diagnosis not present

## 2017-11-08 LAB — CBC
HCT: 41.8 % (ref 39.0–52.0)
Hemoglobin: 13.8 g/dL (ref 13.0–17.0)
MCH: 28.8 pg (ref 26.0–34.0)
MCHC: 33 g/dL (ref 30.0–36.0)
MCV: 87.1 fL (ref 78.0–100.0)
PLATELETS: 278 10*3/uL (ref 150–400)
RBC: 4.8 MIL/uL (ref 4.22–5.81)
RDW: 13.6 % (ref 11.5–15.5)
WBC: 11.1 10*3/uL — AB (ref 4.0–10.5)

## 2017-11-08 LAB — GLUCOSE, CAPILLARY: GLUCOSE-CAPILLARY: 103 mg/dL — AB (ref 65–99)

## 2017-11-08 LAB — PROTIME-INR
INR: 1.04
PROTHROMBIN TIME: 13.5 s (ref 11.4–15.2)

## 2017-11-08 LAB — APTT: APTT: 31 s (ref 24–36)

## 2017-11-08 MED ORDER — MIDAZOLAM HCL 2 MG/2ML IJ SOLN
INTRAMUSCULAR | Status: AC | PRN
  Administered 2017-11-08: 1 mg via INTRAVENOUS

## 2017-11-08 MED ORDER — HYDROCODONE-ACETAMINOPHEN 5-325 MG PO TABS: 1.0000 | ORAL_TABLET | ORAL | Status: DC | PRN

## 2017-11-08 MED ORDER — SODIUM CHLORIDE 0.9 % IV SOLN
INTRAVENOUS | Status: DC
  Administered 2017-11-08: 10:00:00 via INTRAVENOUS

## 2017-11-08 MED ORDER — LIDOCAINE HCL (PF) 1 % IJ SOLN
INTRAMUSCULAR | Status: AC | PRN
  Administered 2017-11-08: 10 mL

## 2017-11-08 MED ORDER — MIDAZOLAM HCL 2 MG/2ML IJ SOLN
INTRAMUSCULAR | Status: AC
  Filled 2017-11-08: qty 4

## 2017-11-08 MED ORDER — FENTANYL CITRATE (PF) 100 MCG/2ML IJ SOLN
INTRAMUSCULAR | Status: AC
  Filled 2017-11-08: qty 2

## 2017-11-08 MED ORDER — FENTANYL CITRATE (PF) 100 MCG/2ML IJ SOLN
INTRAMUSCULAR | Status: AC | PRN
  Administered 2017-11-08: 50 ug via INTRAVENOUS

## 2017-11-08 NOTE — Procedures (Signed)
  Procedure: CT R adrenal core biopsy 18g x3 EBL:   minimal Complications:  none immediate  See full dictation in [facility].  Dillard Cannon MD Main # [phone] Pager  [phone]

## 2017-11-08 NOTE — Discharge Instructions (Signed)
Moderate Conscious Sedation, Adult, Care After °These instructions provide you with information about caring for yourself after your procedure. Your health care provider may also give you more specific instructions. Your treatment has been planned according to current medical practices, but problems sometimes occur. Call your health care provider if you have any problems or questions after your procedure. °What can I expect after the procedure? °After your procedure, it is common: °· To feel sleepy for several hours. °· To feel clumsy and have poor balance for several hours. °· To have poor judgment for several hours. °· To vomit if you eat too soon. ° °Follow these instructions at home: °For at least 24 hours after the procedure: ° °· Do not: °? Participate in activities where you could fall or become injured. °? Drive. °? Use heavy machinery. °? Drink alcohol. °? Take sleeping pills or medicines that cause drowsiness. °? Make important decisions or sign legal documents. °? Take care of children on your own. °· Rest. °Eating and drinking °· Follow the diet recommended by your health care provider. °· If you vomit: °? Drink water, juice, or soup when you can drink without vomiting. °? Make sure you have little or no nausea before eating solid foods. °General instructions °· Have a responsible adult stay with you until you are awake and alert. °· Take over-the-counter and prescription medicines only as told by your health care provider. °· If you smoke, do not smoke without supervision. °· Keep all follow-up visits as told by your health care provider. This is important. °Contact a health care provider if: °· You keep feeling nauseous or you keep vomiting. °· You feel light-headed. °· You develop a rash. °· You have a fever. °Get help right away if: °· You have trouble breathing. °This information is not intended to replace advice given to you by your health care provider. Make sure you discuss any questions you have  with your health care provider. °Document Released: 03/05/2013 Document Revised: 10/18/2015 Document Reviewed: 09/04/2015 °Elsevier Interactive Patient Education © 2018 [employer] ° ° °Needle Biopsy, Care After °These instructions give you information about caring for yourself after your procedure. Your doctor may also give you more specific instructions. Call your doctor if you have any problems or questions after your procedure. °Follow these instructions at home: °· Rest as told by your doctor. °· Take medicines only as told by your doctor. °· There are many different ways to close and cover the biopsy site, including stitches (sutures), skin glue, and adhesive strips. Follow instructions from your doctor about: °? How to take care of your biopsy site. °? When and how you should change your bandage (dressing).  You may remove your dressing tomorrow.  You may shower tomorrow. °? When you should remove your dressing. °? Removing whatever was used to close your biopsy site. °· Check your biopsy site every day for signs of infection. Watch for: °? Redness, swelling, or pain. °? Fluid, blood, or pus. °Contact a doctor if: °· You have a fever. °· You have redness, swelling, or pain at the biopsy site, and it lasts longer than a few days. °· You have fluid, blood, or pus coming from the biopsy site. °· You feel sick to your stomach (nauseous). °· You throw up (vomit). °Get help right away if: °· You are short of breath. °· You have trouble breathing. °· Your chest hurts. °· You feel dizzy or you pass out (faint). °· You have bleeding that does   not stop with pressure or a bandage. °· You cough up blood. °· Your belly (abdomen) hurts. °This information is not intended to replace advice given to you by your health care provider. Make sure you discuss any questions you have with your health care provider. °Document Released: 04/27/2008 Document Revised: 10/21/2015 Document Reviewed: 05/11/2014 °Elsevier Interactive  Patient Education © 2018 [employer] ° °

## 2017-11-08 NOTE — H&P (Signed)
Chief Complaint: Patient was seen in consultation today for right adrenal mass biopsy at the request of [facility]  Referring Physician(s): Mohamed,Mohamed  Supervising Physician: Arne Cleveland  Patient Status: [facility] - Out-pt  History of Present Illness: Randy [name] is a 72 y.o. male with known lung cancer. A recent scan showed enlarged right adrenal mass. He is referred for biopsy. PMHx, meds, labs, imaging, allergies reviewed. Feels well, no recent fevers, chills, illness. Has been NPO today as directed. Family at bedside.   Past Medical History:  Diagnosis Date  . 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 Repeat US due 2023  . Carotid arterial disease ([place])    a. S/p R CEA;  b. 05/2015 Carotid U/S: RICA 5-03%, LICA 54-65%.  . Coronary artery disease    a. 2012 MI in [place] w/ PCI  //  b. 04/2013 MV: inflat scar, no ischemia.  //  c.   NSTEMI 5/17 >> Promus DES to LCx and POBA to OM1 (ISR).   . Diabetes mellitus without complication ([place])   . Grade II diastolic dysfunction 68/04/7516  . History of cardiac catheterization 10/21/15   a. NSTEMI 5/17: oLAD 40, pLCx 90 (Promus DES), OM2 90 ISR (POBA), oRCA 25   . History of echocardiogram    a. Mild LVH, EF 50-55%, possible inferolateral HK, grade 2 diastolic dysfunction, trivial AI, MAC  . History of nuclear stress test 04/2013   a. Myoview 12/14: Intermediate risk, prior inferolateral MI, no ischemia, EF 45%  . Hx of leukocytosis   . Hyperlipidemia   . Hypertension   . Hypertensive heart disease   . Morbid obesity ([place])   . Tobacco abuse     Past Surgical History:  Procedure Laterality Date  . CARDIAC CATHETERIZATION    . CARDIAC CATHETERIZATION N/A 10/21/2015   Procedure: Left Heart Cath and Coronary Angiography;  Surgeon: [name], MD;  Location: [facility];  Service: Cardiovascular;  Laterality: N/A;  . CARDIAC CATHETERIZATION N/A 10/21/2015   Procedure: Coronary Stent  Intervention;  Surgeon: [name], MD;  Location: [facility];  Service: Cardiovascular;  Laterality: N/A;  . CARDIAC CATHETERIZATION N/A 10/21/2015   Procedure: Coronary Balloon Angioplasty;  Surgeon: [name], MD;  Location: [facility];  Service: Cardiovascular;  Laterality: N/A;  . CAROTID ENDARTERECTOMY    . CORONARY ANGIOPLASTY    . CORONARY STENT PLACEMENT    . VIDEO BRONCHOSCOPY Bilateral 04/03/2017   Procedure: VIDEO BRONCHOSCOPY WITH FLUORO;  Surgeon: [name], MD;  Location: [facility];  Service: Cardiopulmonary;  Laterality: Bilateral;    Allergies: Brilinta [ticagrelor]  Medications: Prior to Admission medications   Medication Sig Start Date End Date Taking? Authorizing Provider  aspirin EC 81 MG tablet Take 81 mg by mouth daily.   Yes [provider]  atorvastatin (LIPITOR) 40 MG tablet TAKE 1 TABLET BY MOUTH EVERY DAY 06/11/17  Yes Fay Records, MD  chlorpheniramine-HYDROcodone (TUSSIONEX) 10-8 MG/5ML SUER Take 5 mLs by mouth every 12 (twelve) hours as needed for cough. 10/23/17  Yes Owens Shark, NP  metFORMIN (GLUCOPHAGE) 500 MG tablet Take 0.5 tablets (250 mg total) by mouth 2 (two) times daily with a meal. 09/26/17  Yes [name], PA-C  metoprolol succinate (TOPROL-XL) 25 MG 24 hr tablet Take 1 tablet (25 mg total) by mouth 2 (two) times daily. 10/29/17  Yes Fay Records, MD  VENTOLIN HFA 108 [phone] Base) MCG/ACT inhaler  Inhale 1 puff every 4 (four) hours as needed into the lungs for wheezing or shortness of breath. 04/03/17  Yes [name], MD  clopidogrel (PLAVIX) 75 MG tablet Take 1 tablet (75 mg total) by mouth daily. 06/06/17   Fay Records, MD  lisinopril (PRINIVIL,ZESTRIL) 5 MG tablet Take 1 tablet (5 mg total) by mouth daily. 09/26/17   [name], PA-C  zolpidem (AMBIEN) 5 MG tablet Take 1 tablet (5 mg total) by mouth at bedtime as needed for sleep. 10/03/17   [name], PA-C     Family History  Problem Relation Age of Onset  . Other Mother        died @ 81 - complications following surgery.  . Hypertension Father        died in MVA @ 76    Social History   Socioeconomic History  . Marital status: Married    Spouse name: ardith  . Number of children: 7  . Years of education: college  . Highest education level: Not on file  Occupational History  . Occupation: retired  Scientific laboratory technician  . Financial resource strain: Not on file  . Food insecurity:    Worry: Not on file    Inability: Not on file  . Transportation needs:    Medical: Not on file    Non-medical: Not on file  Tobacco Use  . Smoking status: Former Smoker    Packs/day: 0.75    Years: 40.00    Pack years: 30.00    Types: Cigarettes    Start date: 06/18/1972    Last attempt to quit: 10/24/2015    Years since quitting: 2.0  . Smokeless tobacco: Never Used  Substance and Sexual Activity  . Alcohol use: No    Alcohol/week: 0.0 oz  . Drug use: No  . Sexual activity: Not Currently  Lifestyle  . Physical activity:    Days per week: Not on file    Minutes per session: Not on file  . Stress: Not on file  Relationships  . Social connections:    Talks on phone: Not on file    Gets together: Not on file    Attends religious service: Not on file    Active member of club or organization: Not on file    Attends meetings of clubs or organizations: Not on file    Relationship status: Not on file  Other Topics Concern  . Not on file  Social History Narrative   Lives in [place] with wife.  Does not routinely exercise.  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     Review of Systems: A 12 point ROS discussed and pertinent positives are indicated in the HPI above.  All other systems are negative.  Review of Systems  Vital Signs: BP (!) 152/92 (BP Location: Right Arm)   Pulse 92   Temp 98 F (36.7 C) (Oral)   Resp 18   SpO2 97%    Physical Exam  Constitutional: He is oriented to person, place, and time. He appears well-developed and well-nourished. No distress.  HENT:  Head: Normocephalic.  Mouth/Throat: Oropharynx is clear and moist.  Neck: Normal range of motion. No JVD present.  Cardiovascular: Normal rate, regular rhythm and normal heart sounds.  Pulmonary/Chest: Effort normal and breath sounds normal. No respiratory distress.  Abdominal: Soft. He exhibits no mass. There is no tenderness.  Neurological: He is alert and oriented to person, place,  and time.  Skin: Skin is warm and dry.    Imaging: Ct Chest W Contrast  Result Date: 10/19/2017 CLINICAL DATA:  Followup lung cancer EXAM: CT CHEST WITH CONTRAST TECHNIQUE: Multidetector CT imaging of the chest was performed during intravenous contrast administration. CONTRAST:  45m ISOVUE-300 IOPAMIDOL (ISOVUE-300) INJECTION 61% COMPARISON:  07/16/2017. FINDINGS: Cardiovascular: Heart size appears normal. There is aortic atherosclerosis. Calcification in the RCA, LAD, left main and left circumflex coronary arteries noted. Mediastinum/Nodes: Normal appearance of the thyroid gland. The trachea appears patent and is midline. Normal appearance of the esophagus. No supraclavicular or axillary adenopathy. No enlarged mediastinal or hilar lymph nodes. Lungs/Pleura: Moderate changes of emphysema. There is a new small to moderate right pleural effusion. There is progressive interstitial thickening within the overlying right lower, nonspecific. Right upper lobe lung mass invading the mediastinum measures 3.5 x 3.3 x 1.5 cm (volume = 9.1 cm^3) On the previous exam this measured 3.8 by 4.2 by 2.3 cm (volume = 19 cm^3). Surrounding fibrosis and masslike architectural distortion is similar to previous exam and is likely to reflect changes due to external beam radiation Upper Abdomen: Enlargement of right adrenal gland which now measures 3.5 x 4.9 cm, image 137/2. This is compared with 2.5  x 1.3 cm. Normal appearance of the left adrenal gland. Musculoskeletal: No chest wall abnormality. No acute or significant osseous findings. IMPRESSION: 1. There has been decrease in size of invasive mass within the paramediastinal right upper lobe. 2. New right pleural effusion with progressive interstitial thickening in the right lower lobe. Nonspecific. Favor inflammatory or infectious etiologies. 3. 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 4. Aortic Atherosclerosis (ICD10-I70.0) and Emphysema (ICD10-J43.9). 5. Three vessel coronary artery atherosclerotic calcifications including left main disease. Electronically Signed   By: TKerby MoorsM.D.   On: 10/19/2017 16:43    Labs:  CBC: Recent Labs    09/24/17 1309 10/08/17 1043 10/23/17 1343 11/08/17 0931  WBC 9.5 9.5 10.3 11.1*  HGB 13.6 13.4 13.7 13.8  HCT 41.3 40.7 42.2 41.8  PLT 230 239 277 278    COAGS: Recent Labs    11/08/17 0931  INR 1.04  APTT 31    BMP: Recent Labs    09/10/17 1108 09/24/17 1309 10/08/17 1043 10/23/17 1343  NA 136 135* 137 134*  K 4.6 4.5 4.3 4.7  CL 99 100 101 99  CO2 _0 GLUCOSE 217* 100 128 93  BUN _1 CALCIUM 9.8 9.6 9.4 9.6  CREATININE 0.93 0.82 0.78 0.76  GFRNONAA >60 >60 >60 >60  GFRAA >60 >60 >60 >60    LIVER FUNCTION TESTS: Recent Labs    09/10/17 1108 09/24/17 1309 10/08/17 1043 10/23/17 1343  BILITOT 0.7 0.6 0.5 0.6  AST _2 ALT _3 ALKPHOS 96 79 70 77  PROT 7.6 7.8 7.3 7.8  ALBUMIN 3.3* 3.5 3.3* 3.4*    TUMOR MARKERS: No results for input(s): AFPTM, CEA, CA199, CHROMGRNA in the last 8760 hours.  Assessment and Plan: (R)adrenal mass in setting of known lung cancer For CT guided biopsy Labs ok Risks and benefits discussed with the patient including, but not limited to bleeding, infection, damage to adjacent structures or low yield requiring additional tests.  All of the  patient's questions were answered, patient is agreeable to proceed. Consent signed and in chart.    Thank you for this interesting consult.  I greatly enjoyed meeting THEOPOLIS SLOOP and look forward to participating in their care.  A copy of this report was sent to the requesting provider on this date.  Electronically Signed: Ascencion Dike, PA-C 11/08/2017, 10:31 AM   I spent a total of 20 minutes in face to face in clinical consultation, greater than 50% of which was counseling/coordinating care for adrenal biopsy

## 2017-11-12 ENCOUNTER — Inpatient Hospital Stay: Payer: Medicare Other

## 2017-11-12 ENCOUNTER — Other Ambulatory Visit: Payer: Self-pay | Admitting: Internal Medicine

## 2017-11-12 ENCOUNTER — Telehealth: Payer: Self-pay

## 2017-11-12 ENCOUNTER — Inpatient Hospital Stay: Payer: Medicare Other | Attending: Internal Medicine

## 2017-11-12 ENCOUNTER — Inpatient Hospital Stay (HOSPITAL_BASED_OUTPATIENT_CLINIC_OR_DEPARTMENT_OTHER): Payer: Medicare Other | Admitting: Nurse Practitioner

## 2017-11-12 ENCOUNTER — Encounter: Payer: Self-pay | Admitting: Nurse Practitioner

## 2017-11-12 VITALS — BP 134/71 | HR 85 | Temp 97.7°F | Resp 19 | Ht 71.0 in | Wt 200.6 lb

## 2017-11-12 DIAGNOSIS — C3491 Malignant neoplasm of unspecified part of right bronchus or lung: Secondary | ICD-10-CM

## 2017-11-12 DIAGNOSIS — C3411 Malignant neoplasm of upper lobe, right bronchus or lung: Secondary | ICD-10-CM | POA: Diagnosis not present

## 2017-11-12 DIAGNOSIS — R0602 Shortness of breath: Secondary | ICD-10-CM | POA: Insufficient documentation

## 2017-11-12 DIAGNOSIS — J90 Pleural effusion, not elsewhere classified: Secondary | ICD-10-CM

## 2017-11-12 DIAGNOSIS — Z5111 Encounter for antineoplastic chemotherapy: Secondary | ICD-10-CM

## 2017-11-12 DIAGNOSIS — C7971 Secondary malignant neoplasm of right adrenal gland: Secondary | ICD-10-CM | POA: Insufficient documentation

## 2017-11-12 DIAGNOSIS — Z515 Encounter for palliative care: Secondary | ICD-10-CM | POA: Insufficient documentation

## 2017-11-12 DIAGNOSIS — Z7189 Other specified counseling: Secondary | ICD-10-CM

## 2017-11-12 DIAGNOSIS — Z5112 Encounter for antineoplastic immunotherapy: Secondary | ICD-10-CM

## 2017-11-12 LAB — CBC WITH DIFFERENTIAL (CANCER CENTER ONLY)
Basophils Absolute: 0.3 10*3/uL — ABNORMAL HIGH (ref 0.0–0.1)
Basophils Relative: 2 %
EOS ABS: 0.3 10*3/uL (ref 0.0–0.5)
Eosinophils Relative: 3 %
HEMATOCRIT: 40.3 % (ref 38.4–49.9)
HEMOGLOBIN: 12.8 g/dL — AB (ref 13.0–17.1)
LYMPHS ABS: 1.8 10*3/uL (ref 0.9–3.3)
LYMPHS PCT: 15 %
MCH: 27.5 pg (ref 27.2–33.4)
MCHC: 31.9 g/dL — AB (ref 32.0–36.0)
MCV: 86.1 fL (ref 79.3–98.0)
Monocytes Absolute: 1.1 10*3/uL — ABNORMAL HIGH (ref 0.1–0.9)
Monocytes Relative: 9 %
NEUTROS ABS: 8.1 10*3/uL — AB (ref 1.5–6.5)
NEUTROS PCT: 71 %
Platelet Count: 270 10*3/uL (ref 140–400)
RBC: 4.68 MIL/uL (ref 4.20–5.82)
RDW: 14.1 % (ref 11.0–14.6)
WBC Count: 11.5 10*3/uL — ABNORMAL HIGH (ref 4.0–10.3)

## 2017-11-12 LAB — CMP (CANCER CENTER ONLY)
ALK PHOS: 74 U/L (ref 40–150)
ALT: 8 U/L (ref 0–55)
ANION GAP: 10 (ref 3–11)
AST: 12 U/L (ref 5–34)
Albumin: 3.2 g/dL — ABNORMAL LOW (ref 3.5–5.0)
BILIRUBIN TOTAL: 0.6 mg/dL (ref 0.2–1.2)
BUN: 10 mg/dL (ref 7–26)
CALCIUM: 10 mg/dL (ref 8.4–10.4)
CO2: 28 mmol/L (ref 22–29)
Chloride: 97 mmol/L — ABNORMAL LOW (ref 98–109)
Creatinine: 0.84 mg/dL (ref 0.70–1.30)
GFR, Estimated: >60 mL/min (ref >60)
GLUCOSE: 103 mg/dL (ref 70–140)
Potassium: 5.4 mmol/L — ABNORMAL HIGH (ref 3.5–5.1)
SODIUM: 135 mmol/L — AB (ref 136–145)
TOTAL PROTEIN: 7.6 g/dL (ref 6.4–8.3)

## 2017-11-12 MED ORDER — HYDROCOD POLST-CPM POLST ER 10-8 MG/5ML PO SUER: 5.0000 mL | Freq: Two times a day (BID) | ORAL | 0 refills | Status: DC | PRN

## 2017-11-12 NOTE — Telephone Encounter (Signed)
Printed avs and calender of upcoming appointment per 6/17 los.

## 2017-11-12 NOTE — Progress Notes (Addendum)
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  PRIOR THERAPY:A course of concurrent chemoradiation with weekly carboplatin for AUC of 2 and paclitaxel 45 mg/M2.Status post6cycles.  CURRENT THERAPY:Consolidation immunotherapy with Imfinzi given every 2 weeks. First dose 07/30/2017.Status post6cycles.  INTERVAL HISTORY:   Randy [name] returns as scheduled.  He notes a decline in his energy level and increased shortness of breath since the last treatment with Imfinzi.  He has an occasional cough at bedtime.  He takes Tussionex as needed.  Appetite is poor.  No rash.  No diarrhea.  No urinary symptoms.  Objective:  Vital signs in last 24 hours:  Blood pressure 134/71, pulse 85, temperature 97.7 F (36.5 C), temperature source Oral, resp. rate 19, height 5\' 11"  (1.803 m), weight 200 lb 9.6 oz (91 kg), SpO2 97 %.    HEENT: No thrush or ulcers. Resp: Lungs clear bilaterally. Cardio: Regular rate and rhythm. GI: Abdomen soft and nontender.  No hepatomegaly. Vascular: No leg edema. Neuro: Alert and oriented. Skin: No rash.   Lab Results:  Lab Results  Component Value Date   WBC 11.5 (H) 11/12/2017   HGB 12.8 (L) 11/12/2017   HCT 40.3 11/12/2017   MCV 86.1 11/12/2017   PLT 270 11/12/2017   NEUTROABS 8.1 (H) 11/12/2017    Imaging:  No results found.  Medications: I have reviewed the patient's current medications.  Assessment/Plan: 1. Stage IIIa/B non-small cell lung cancer, squamous cell carcinoma, status post concurrent chemoradiation with weekly carboplatin and Taxol with a partial response.  Currently undergoing treatment with consolidation immunotherapy with Imfinzi.   Six cycles completed to date.  Recent restaging CT evaluation shows decrease in the mass within the paramediastinal right upper lobe, new right pleural effusion and increase in the size of a right adrenal gland nodule.  Biopsy right adrenal mass 11/08/2017 with pathology showing metastatic squamous cell carcinoma.     Disposition: Randy [name] appears unchanged.  He underwent biopsy of the right adrenal mass 11/08/2017.  Pathology showed metastatic squamous cell carcinoma.  Dr. [name] reviewed this with [name] at today's visit.  He understands this indicates stage IV disease.  Dr. [name] recommends initiating treatment with carboplatin/Taxol/Keytruda every 3 weeks x 4 followed by single agent Keytruda.  He is familiar with the potential toxicities associated with immunotherapy which we again reviewed at today's visit.  We reviewed potential toxicities associated with chemotherapy including bone marrow toxicity, alopecia, nausea, mouth sores, diarrhea or constipation, neuropathy.  He agrees to proceed.  He will return for cycle 1 on 11/19/2017.  He will return for a follow-up visit and cycle 2 on 12/10/2017.  He will contact the office in the interim with any problems.  Patient seen with Dr. [name].    Ned Card ANP/GNP-BC   11/12/2017  3:16 PM   ADDENDUM: Hematology/Oncology Attending: I had a face-to-face encounter with the patient today.  I recommended his care plan.  This is a very pleasant 72 years old white male with now metastatic non-small cell lung cancer that was initially diagnosed as a stage IIIb squamous cell carcinoma.  He underwent a course of concurrent chemoradiation with partial response followed by 6 cycles of consolidation treatment with immunotherapy with Imfinzi (Durvalumab).  Unfortunately his recent CT scan showed evidence for  new right pleural effusion as well as increase in the size of right adrenal gland nodule that was biopsy-proven to be metastatic squamous cell  carcinoma. I had a lengthy discussion with the patient and his wife about his condition and treatment options. I discussed with the patient the option of palliative care versus consideration of palliative systemic chemotherapy with carboplatin for AUC of 5, paclitaxel 175 mg/M2 and Keytruda 200 mg IV every 3 weeks.  He is interested in proceeding with systemic chemotherapy and Keytruda.  I discussed with the patient the adverse effect of the chemotherapy including but not limited to alopecia, myelosuppression, nausea and vomiting, peripheral neuropathy, liver or renal dysfunction as well as the adverse effect of the immunotherapy which the patient is very familiar with. He is expected to start the first cycle of this treatment next week. We will see the patient back for follow-up visit in 4 weeks for evaluation with the start of cycle #2. He was advised to call immediately if he has any concerning symptoms in the interval.  Disclaimer: This note was dictated with voice recognition software. Similar sounding words can inadvertently be transcribed and may be missed upon review. Eilleen Kempf, MD 11/12/17

## 2017-11-12 NOTE — Telephone Encounter (Signed)
Printed avs and calender of upcoming appointment. Per 6/17 los

## 2017-11-16 ENCOUNTER — Telehealth: Payer: Self-pay | Admitting: Medical Oncology

## 2017-11-16 NOTE — Telephone Encounter (Signed)
Pt was recently seen at [facility] and ? entered into clinical trial with Tisotumab Vedotin x 21 days.  Patient cancelled all his appointments at [facility] center . I told him to call back if he wants to be seen again. He voiced understanding.

## 2017-11-19 ENCOUNTER — Inpatient Hospital Stay: Payer: Medicare Other

## 2017-11-19 ENCOUNTER — Ambulatory Visit: Payer: Medicare Other | Admitting: Internal Medicine

## 2017-11-21 DIAGNOSIS — C3411 Malignant neoplasm of upper lobe, right bronchus or lung: Secondary | ICD-10-CM | POA: Diagnosis not present

## 2017-11-21 DIAGNOSIS — Z006 Encounter for examination for normal comparison and control in clinical research program: Secondary | ICD-10-CM | POA: Diagnosis not present

## 2017-11-21 DIAGNOSIS — K838 Other specified diseases of biliary tract: Secondary | ICD-10-CM | POA: Diagnosis not present

## 2017-11-21 DIAGNOSIS — R918 Other nonspecific abnormal finding of lung field: Secondary | ICD-10-CM | POA: Diagnosis not present

## 2017-11-21 DIAGNOSIS — E278 Other specified disorders of adrenal gland: Secondary | ICD-10-CM | POA: Diagnosis not present

## 2017-11-21 DIAGNOSIS — J432 Centrilobular emphysema: Secondary | ICD-10-CM | POA: Diagnosis not present

## 2017-11-21 DIAGNOSIS — N289 Disorder of kidney and ureter, unspecified: Secondary | ICD-10-CM | POA: Diagnosis not present

## 2017-11-22 DIAGNOSIS — Z006 Encounter for examination for normal comparison and control in clinical research program: Secondary | ICD-10-CM | POA: Diagnosis not present

## 2017-11-22 DIAGNOSIS — C3491 Malignant neoplasm of unspecified part of right bronchus or lung: Secondary | ICD-10-CM | POA: Diagnosis not present

## 2017-11-22 DIAGNOSIS — C3411 Malignant neoplasm of upper lobe, right bronchus or lung: Secondary | ICD-10-CM | POA: Diagnosis not present

## 2017-11-26 ENCOUNTER — Other Ambulatory Visit: Payer: Medicare Other

## 2017-11-27 DIAGNOSIS — C3490 Malignant neoplasm of unspecified part of unspecified bronchus or lung: Secondary | ICD-10-CM | POA: Diagnosis not present

## 2017-11-28 DIAGNOSIS — K639 Disease of intestine, unspecified: Secondary | ICD-10-CM | POA: Diagnosis not present

## 2017-11-28 DIAGNOSIS — C3491 Malignant neoplasm of unspecified part of right bronchus or lung: Secondary | ICD-10-CM | POA: Diagnosis not present

## 2017-11-28 DIAGNOSIS — J90 Pleural effusion, not elsewhere classified: Secondary | ICD-10-CM | POA: Diagnosis not present

## 2017-11-28 DIAGNOSIS — C3411 Malignant neoplasm of upper lobe, right bronchus or lung: Secondary | ICD-10-CM | POA: Diagnosis not present

## 2017-11-28 DIAGNOSIS — I96 Gangrene, not elsewhere classified: Secondary | ICD-10-CM | POA: Diagnosis not present

## 2017-12-03 ENCOUNTER — Other Ambulatory Visit: Payer: Medicare Other

## 2017-12-04 DIAGNOSIS — C3411 Malignant neoplasm of upper lobe, right bronchus or lung: Secondary | ICD-10-CM | POA: Diagnosis not present

## 2017-12-04 DIAGNOSIS — Z006 Encounter for examination for normal comparison and control in clinical research program: Secondary | ICD-10-CM | POA: Diagnosis not present

## 2017-12-05 DIAGNOSIS — E785 Hyperlipidemia, unspecified: Secondary | ICD-10-CM | POA: Diagnosis not present

## 2017-12-05 DIAGNOSIS — Z87891 Personal history of nicotine dependence: Secondary | ICD-10-CM | POA: Diagnosis not present

## 2017-12-05 DIAGNOSIS — Z79899 Other long term (current) drug therapy: Secondary | ICD-10-CM | POA: Diagnosis not present

## 2017-12-05 DIAGNOSIS — J449 Chronic obstructive pulmonary disease, unspecified: Secondary | ICD-10-CM | POA: Diagnosis not present

## 2017-12-05 DIAGNOSIS — C7971 Secondary malignant neoplasm of right adrenal gland: Secondary | ICD-10-CM | POA: Diagnosis not present

## 2017-12-05 DIAGNOSIS — E119 Type 2 diabetes mellitus without complications: Secondary | ICD-10-CM | POA: Diagnosis not present

## 2017-12-05 DIAGNOSIS — Z955 Presence of coronary angioplasty implant and graft: Secondary | ICD-10-CM | POA: Diagnosis not present

## 2017-12-05 DIAGNOSIS — C3490 Malignant neoplasm of unspecified part of unspecified bronchus or lung: Secondary | ICD-10-CM | POA: Diagnosis not present

## 2017-12-05 DIAGNOSIS — I2510 Atherosclerotic heart disease of native coronary artery without angina pectoris: Secondary | ICD-10-CM | POA: Diagnosis not present

## 2017-12-05 DIAGNOSIS — Z7984 Long term (current) use of oral hypoglycemic drugs: Secondary | ICD-10-CM | POA: Diagnosis not present

## 2017-12-05 DIAGNOSIS — C3491 Malignant neoplasm of unspecified part of right bronchus or lung: Secondary | ICD-10-CM | POA: Diagnosis not present

## 2017-12-07 ENCOUNTER — Telehealth: Payer: Self-pay | Admitting: Internal Medicine

## 2017-12-07 DIAGNOSIS — R000 Tachycardia, unspecified: Secondary | ICD-10-CM

## 2017-12-07 NOTE — Telephone Encounter (Signed)
HR 110s constantly for about last 3 days. Started new treatment last Tuesday.  It is once every 3 weeks. Oncology at [facility] told him the medication would not cause the increased HR. Knows he is not drinking enough water.  Reviewed that dehydration can cause increased HR. BP around 118/77. No chest pain.  SOB with activity is slightly worse than normal.  Otherwise no symptoms of faster HR.  Would like recommendation from Dr. Harrington Challenger.   If possible would like to call back to [facility] today to let them know what his cardiologist says about his HR.  Pt aware I will forward/discuss with Dr. Harrington Challenger.

## 2017-12-07 NOTE — Telephone Encounter (Signed)
New message   STAT if HR is under 50 or over 120 (normal HR is 60-100 beats per minute)  1) What is your heart rate? 112  2) Do you have a log of your heart rate readings (document readings)? yes  Do you have any other symptoms? The heart rate has went up morning and afternoon, he states that his heart rate is 110 to 112 constantly. The pt states that his heart rate was prev 88 to 90.  The patient states he is going experimental procedure for cancer. [facility] is doing the procedure. He states that he is taking an experimental drug. He is not sure is this is causing a change in his heart rate.

## 2017-12-07 NOTE — Telephone Encounter (Signed)
Spoke to pt on telephone HR has been up    He deneis dizziness   No CP  Breathing is stable   Recomm to increase metoprolol to 25 bid (XL )   He is on 1 1/2 per day  Will arrange for holter monitor 24 hour.

## 2017-12-07 NOTE — Telephone Encounter (Signed)
Order placed for 24 hr holter.

## 2017-12-09 ENCOUNTER — Encounter: Payer: Self-pay | Admitting: Internal Medicine

## 2017-12-10 ENCOUNTER — Ambulatory Visit: Payer: Medicare Other | Admitting: Oncology

## 2017-12-10 ENCOUNTER — Telehealth: Payer: Self-pay | Admitting: Internal Medicine

## 2017-12-10 ENCOUNTER — Other Ambulatory Visit: Payer: Medicare Other

## 2017-12-10 MED ORDER — METOPROLOL SUCCINATE ER 25 MG PO TB24: ORAL_TABLET | ORAL | 3 refills | Status: DC

## 2017-12-10 NOTE — Telephone Encounter (Signed)
New Message      Patient has a heart monitor schedule 07/18 and would like a nurse to call him back concerning this matter.

## 2017-12-10 NOTE — Telephone Encounter (Addendum)
Error

## 2017-12-10 NOTE — Telephone Encounter (Signed)
Patient reports BP Sun AM 98/72 HR 103 (after taking whole tablet of Toprol XL BID) and Sun eve 96/70.  He was asymptomatic with these pressures.  Did not take any Toprol XL that day with those readings.  This am 104/70, HR 109.  Took whole tablet. Advised to go back to ONE in am and HALF in pm of Toprol XL.  Will route to Dr. Harrington Challenger for any further recommendations. Advised patient I would ask Dr. Harrington Challenger if changing to short acting metoprolol would be an option.

## 2017-12-10 NOTE — Telephone Encounter (Signed)
Agree with 1 Toprol XL in AM and 1/2 in PM Stay hydrated.

## 2017-12-11 ENCOUNTER — Ambulatory Visit: Payer: Medicare Other

## 2017-12-11 DIAGNOSIS — Z95828 Presence of other vascular implants and grafts: Secondary | ICD-10-CM | POA: Diagnosis not present

## 2017-12-11 DIAGNOSIS — C3491 Malignant neoplasm of unspecified part of right bronchus or lung: Secondary | ICD-10-CM | POA: Diagnosis not present

## 2017-12-13 ENCOUNTER — Ambulatory Visit (INDEPENDENT_AMBULATORY_CARE_PROVIDER_SITE_OTHER): Payer: Medicare Other

## 2017-12-13 DIAGNOSIS — R000 Tachycardia, unspecified: Secondary | ICD-10-CM | POA: Diagnosis not present

## 2017-12-17 ENCOUNTER — Other Ambulatory Visit: Payer: Medicare Other

## 2017-12-24 ENCOUNTER — Other Ambulatory Visit: Payer: Medicare Other

## 2017-12-24 ENCOUNTER — Encounter: Payer: Self-pay | Admitting: Internal Medicine

## 2017-12-24 ENCOUNTER — Ambulatory Visit (INDEPENDENT_AMBULATORY_CARE_PROVIDER_SITE_OTHER): Payer: Medicare Other | Admitting: Internal Medicine

## 2017-12-24 VITALS — BP 128/86 | HR 91 | Ht 71.0 in | Wt 186.2 lb

## 2017-12-24 DIAGNOSIS — R000 Tachycardia, unspecified: Secondary | ICD-10-CM | POA: Diagnosis not present

## 2017-12-24 DIAGNOSIS — I2510 Atherosclerotic heart disease of native coronary artery without angina pectoris: Secondary | ICD-10-CM | POA: Diagnosis not present

## 2017-12-24 DIAGNOSIS — I119 Hypertensive heart disease without heart failure: Secondary | ICD-10-CM | POA: Diagnosis not present

## 2017-12-24 DIAGNOSIS — E782 Mixed hyperlipidemia: Secondary | ICD-10-CM | POA: Diagnosis not present

## 2017-12-24 LAB — CORTISOL: CORTISOL: 17.3 ug/dL

## 2017-12-24 LAB — BASIC METABOLIC PANEL
BUN / CREAT RATIO: 8 — AB (ref 10–24)
BUN: 6 mg/dL — ABNORMAL LOW (ref 8–27)
CHLORIDE: 93 mmol/L — AB (ref 96–106)
CO2: 23 mmol/L (ref 20–29)
Calcium: 9.4 mg/dL (ref 8.6–10.2)
Creatinine, Ser: 0.71 mg/dL — ABNORMAL LOW (ref 0.76–1.27)
GFR calc Af Amer: 108 mL/min/{1.73_m2} (ref >59)
GFR calc non Af Amer: 94 mL/min/{1.73_m2} (ref >59)
Glucose: 113 mg/dL — ABNORMAL HIGH (ref 65–99)
Potassium: 4.5 mmol/L (ref 3.5–5.2)
Sodium: 132 mmol/L — ABNORMAL LOW (ref 134–144)

## 2017-12-24 NOTE — Progress Notes (Signed)
Cardiology Office Note   Date:  12/24/2017   ID:  Randy [name], DOB 1946-03-02, MRN [id number]  PCP:  [name], PA-C  Cardiologist:   [name], MD   F/U of CAD and palpitations     History of Present Illness: Randy [name] is a 72 y.o. male with a history of CAD    S/p PCI in 2012 in [place]  Pt went on to have an NSTEMI in May 2017   Cardiac catheterization demonstrated 90% proximal LCx stenosis treated with a Promus DES and 95% OM1 in-stent restenosis treated with POBA (angioscope scoring balloon). Echocardiogram demonstrated mild LVH, EF 55% and possible inferolateral hypokinesis, moderate diastolic dysfunction and trace AI. This PCI course was uneventful. He was discharged on aspirin, Brilinta, Lipitor 80, Avapro, Toprol.   He as been diagnosed with lung CA  (R lung)   Had adrenal Bx   Underwernt XRT and chemo   Now undergoing immunotherapy ath [facility] (Phase II trial)  He contacted the office this spring to say his pulse was running faster and his BP was lower   Recomm he stop his ARB   He has increased metoprolol to 1 tab BID    I recomm a holter montior with continued palpitations   He wore this last wek  It showed SR / ST  HR 105 average  He denies CP   Breathing is fair No dizziness  He says he cut back on albuterol and the heart racing has improved        BP 98 to 130s/  HR 90s to 110s      Current Meds  Medication Sig  . aspirin EC 81 MG tablet Take 81 mg by mouth daily.  [name] atorvastatin (LIPITOR) 40 MG tablet TAKE 1 TABLET BY MOUTH EVERY DAY  . brimonidine (ALPHAGAN) 0.2 % ophthalmic solution Place 3 drops into both eyes as needed. Use as directed  . chlorpheniramine-HYDROcodone (TUSSIONEX) 10-8 MG/5ML SUER Take 5 mLs by mouth every 12 (twelve) hours as needed for cough.  . clopidogrel (PLAVIX) 75 MG tablet Take 1 tablet (75 mg total) by mouth daily.  . metFORMIN (GLUCOPHAGE) 500 MG tablet Take 0.5 tablets (250 mg total) by mouth 2 (two) times daily  with a meal.  . metoprolol succinate (TOPROL-XL) 25 MG 24 hr tablet Take one tablet by mouth every AM and half tablet by mouth every PM  . Polyethyl Glycol-Propyl Glycol 0.4-0.3 % SOLN Place 1 drop into both eyes as needed for dry eyes.  . prednisoLONE acetate (PRED FORTE) 1 % ophthalmic suspension Place 3 drops into both eyes. Use as directed.  . VENTOLIN HFA 108 (90 Base) MCG/ACT inhaler Inhale 1 puff every 4 (four) hours as needed into the lungs for wheezing or shortness of breath.  . zolpidem (AMBIEN) 5 MG tablet Take 5 mg by mouth at bedtime as needed for sleep.     Allergies:   Brilinta [ticagrelor]   Past Medical History:  Diagnosis Date  . 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 Repeat US due 2023  . Carotid arterial disease ([place])    a. S/p R CEA;  b. 05/2015 Carotid U/S: RICA 1-06%, LICA 26-94%.  . Coronary artery disease    a. 2012 MI in [place] w/ PCI  //  b. 04/2013 MV: inflat scar, no ischemia.  //  c.   NSTEMI 5/17 >> Promus DES to LCx and POBA to OM1 (ISR).   . Diabetes mellitus  without complication ([place])   . Grade II diastolic dysfunction 16/02/9603  . History of cardiac catheterization 10/21/15   a. NSTEMI 5/17: oLAD 40, pLCx 90 (Promus DES), OM2 90 ISR (POBA), oRCA 25   . History of echocardiogram    a. Mild LVH, EF 50-55%, possible inferolateral HK, grade 2 diastolic dysfunction, trivial AI, MAC  . History of nuclear stress test 04/2013   a. Myoview 12/14: Intermediate risk, prior inferolateral MI, no ischemia, EF 45%  . Hx of leukocytosis   . Hyperlipidemia   . Hypertension   . Hypertensive heart disease   . Morbid obesity ([place])   . Tobacco abuse     Past Surgical History:  Procedure Laterality Date  . CARDIAC CATHETERIZATION    . CARDIAC CATHETERIZATION N/A 10/21/2015   Procedure: Left Heart Cath and Coronary Angiography;  Surgeon: [name], MD;  Location: [place] [facility];  Service: Cardiovascular;  Laterality: N/A;  . CARDIAC  CATHETERIZATION N/A 10/21/2015   Procedure: Coronary Stent Intervention;  Surgeon: [name], MD;  Location: [place] [facility];  Service: Cardiovascular;  Laterality: N/A;  . CARDIAC CATHETERIZATION N/A 10/21/2015   Procedure: Coronary Balloon Angioplasty;  Surgeon: [name], MD;  Location: [place] [facility];  Service: Cardiovascular;  Laterality: N/A;  . CAROTID ENDARTERECTOMY    . CORONARY ANGIOPLASTY    . CORONARY STENT PLACEMENT    . VIDEO BRONCHOSCOPY Bilateral 04/03/2017   Procedure: VIDEO BRONCHOSCOPY WITH FLUORO;  Surgeon: [name], MD;  Location: [facility];  Service: Cardiopulmonary;  Laterality: Bilateral;     Social History:  The patient  reports that he quit smoking about 2 years ago. His smoking use included cigarettes. He started smoking about 45 years ago. He has a 30.00 pack-year smoking history. He has never used smokeless tobacco. He reports that he does not drink alcohol or use drugs.   Family History:  The patient's family history includes Hypertension in his father; Other in his mother.    ROS:  Please see the history of present illness. All other systems are reviewed and  Negative to the above problem except as noted.    PHYSICAL EXAM: VS:  BP 128/86   Pulse 91   Ht 5' 11"  (1.803 m)   Wt 186 lb 3.2 oz (84.5 kg)   SpO2 99%   BMI 25.97 kg/m   BP 132/80   P 82 (laying)   118/78  P 134 (sitting)   102/66  P 70  Immed standing    124/86    P 93   3 min standing  GEN: Pt is a pale 72 yo , in no acute distress  HEENT: normal  Neck: no JVD, carotid bruits, or masses Cardiac: RRR; no murmurs, rubs, or gallops,no edema  Respiratory: Decreased BS at R Base   GI: soft, nontender, nondistended, + BS  No hepatomegaly  MS: no deformity Moving all extremities   Skin: warm and dry, no rash Neuro:  Strength and sensation are intact Psych: euthymic mood, full affect   EKG:  EKG is not ordered today.   Lipid Panel    Component Value  Date/Time   CHOL 116 10/20/2016 1508   TRIG 149 10/20/2016 1508   HDL 31 (L) 10/20/2016 1508   CHOLHDL 3.7 10/20/2016 1508   CHOLHDL 3.2 12/24/2015 0809   VLDL 20 12/24/2015 0809   LDLCALC 55 10/20/2016 1508      Wt Readings from Last 3 Encounters:  12/24/17  186 lb 3.2 oz (84.5 kg)  11/12/17 200 lb 9.6 oz (91 kg)  10/25/17 199 lb 6.4 oz (90.4 kg)      ASSESSMENT AND PLAN:  1  CAD Pt denies CP  Hx of instent restenosis   Continue ASA and Plavix       Palpitations   Improved with cutting back of albuterol   HR is still up and down   But at end of orthostaticVS BP and HR are OK   WIll check Cortisol and BMET  Encouraged hiim to watch use of inhaler (albuterol)   Randy [name] n current meds   Stay hydrated    2  HTN BP is OK    3  HL  Continue statin     F/U in December     Current medicines are reviewed at length with the patient today.  The patient does not have concerns regarding medicines.  Signed, [name], MD  12/24/2017 10:57 AM    [address], [place], [place]  [address] Phone: [phone]; Fax: [phone]

## 2017-12-24 NOTE — Patient Instructions (Signed)
Your physician recommends that you continue on your current medications as directed. Please refer to the Current Medication list given to you today. Your physician recommends that you return for lab work today (bmet, cortisol) Your physician recommends that you schedule a follow-up appointment in: December with Dr. Harrington Challenger.

## 2017-12-25 DIAGNOSIS — C7971 Secondary malignant neoplasm of right adrenal gland: Secondary | ICD-10-CM | POA: Diagnosis not present

## 2017-12-25 DIAGNOSIS — C3491 Malignant neoplasm of unspecified part of right bronchus or lung: Secondary | ICD-10-CM | POA: Diagnosis not present

## 2017-12-25 DIAGNOSIS — Z006 Encounter for examination for normal comparison and control in clinical research program: Secondary | ICD-10-CM | POA: Diagnosis not present

## 2017-12-25 DIAGNOSIS — Z87891 Personal history of nicotine dependence: Secondary | ICD-10-CM | POA: Diagnosis not present

## 2017-12-31 ENCOUNTER — Telehealth: Payer: Self-pay | Admitting: *Deleted

## 2017-12-31 ENCOUNTER — Ambulatory Visit: Payer: Medicare Other

## 2017-12-31 ENCOUNTER — Ambulatory Visit: Payer: Medicare Other | Admitting: Oncology

## 2017-12-31 ENCOUNTER — Other Ambulatory Visit: Payer: Medicare Other

## 2017-12-31 NOTE — Telephone Encounter (Signed)
Faxed ROI to [facility] - att: Leone Payor; release [phone]

## 2018-01-01 ENCOUNTER — Ambulatory Visit: Payer: Medicare Other | Admitting: Internal Medicine

## 2018-01-01 ENCOUNTER — Ambulatory Visit: Payer: Medicare Other

## 2018-01-01 ENCOUNTER — Other Ambulatory Visit: Payer: Medicare Other

## 2018-01-07 ENCOUNTER — Other Ambulatory Visit: Payer: Medicare Other

## 2018-01-08 ENCOUNTER — Telehealth: Payer: Self-pay | Admitting: Medical Oncology

## 2018-01-08 NOTE — Telephone Encounter (Signed)
Records faxed to [facility]

## 2018-01-14 DIAGNOSIS — Z006 Encounter for examination for normal comparison and control in clinical research program: Secondary | ICD-10-CM | POA: Diagnosis not present

## 2018-01-14 DIAGNOSIS — C3411 Malignant neoplasm of upper lobe, right bronchus or lung: Secondary | ICD-10-CM | POA: Diagnosis not present

## 2018-01-14 DIAGNOSIS — R590 Localized enlarged lymph nodes: Secondary | ICD-10-CM | POA: Diagnosis not present

## 2018-01-14 DIAGNOSIS — E278 Other specified disorders of adrenal gland: Secondary | ICD-10-CM | POA: Diagnosis not present

## 2018-01-14 DIAGNOSIS — J90 Pleural effusion, not elsewhere classified: Secondary | ICD-10-CM | POA: Diagnosis not present

## 2018-01-14 DIAGNOSIS — R918 Other nonspecific abnormal finding of lung field: Secondary | ICD-10-CM | POA: Diagnosis not present

## 2018-01-15 DIAGNOSIS — C3411 Malignant neoplasm of upper lobe, right bronchus or lung: Secondary | ICD-10-CM | POA: Diagnosis not present

## 2018-01-15 DIAGNOSIS — C3490 Malignant neoplasm of unspecified part of unspecified bronchus or lung: Secondary | ICD-10-CM | POA: Diagnosis not present

## 2018-01-15 DIAGNOSIS — Z006 Encounter for examination for normal comparison and control in clinical research program: Secondary | ICD-10-CM | POA: Diagnosis not present

## 2018-01-15 DIAGNOSIS — C7971 Secondary malignant neoplasm of right adrenal gland: Secondary | ICD-10-CM | POA: Diagnosis not present

## 2018-01-15 DIAGNOSIS — R05 Cough: Secondary | ICD-10-CM | POA: Diagnosis not present

## 2018-01-15 DIAGNOSIS — Z87891 Personal history of nicotine dependence: Secondary | ICD-10-CM | POA: Diagnosis not present

## 2018-01-23 ENCOUNTER — Encounter: Payer: Self-pay | Admitting: Physician Assistant

## 2018-02-06 DIAGNOSIS — C7971 Secondary malignant neoplasm of right adrenal gland: Secondary | ICD-10-CM | POA: Diagnosis not present

## 2018-02-06 DIAGNOSIS — R7989 Other specified abnormal findings of blood chemistry: Secondary | ICD-10-CM | POA: Diagnosis not present

## 2018-02-06 DIAGNOSIS — C3411 Malignant neoplasm of upper lobe, right bronchus or lung: Secondary | ICD-10-CM | POA: Diagnosis not present

## 2018-02-06 DIAGNOSIS — E119 Type 2 diabetes mellitus without complications: Secondary | ICD-10-CM | POA: Diagnosis not present

## 2018-02-06 DIAGNOSIS — I2510 Atherosclerotic heart disease of native coronary artery without angina pectoris: Secondary | ICD-10-CM | POA: Diagnosis not present

## 2018-02-06 DIAGNOSIS — R5383 Other fatigue: Secondary | ICD-10-CM | POA: Diagnosis not present

## 2018-02-06 DIAGNOSIS — Z87891 Personal history of nicotine dependence: Secondary | ICD-10-CM | POA: Diagnosis not present

## 2018-02-07 DIAGNOSIS — C3491 Malignant neoplasm of unspecified part of right bronchus or lung: Secondary | ICD-10-CM | POA: Diagnosis not present

## 2018-02-08 DIAGNOSIS — C3491 Malignant neoplasm of unspecified part of right bronchus or lung: Secondary | ICD-10-CM | POA: Diagnosis not present

## 2018-02-13 DIAGNOSIS — C3491 Malignant neoplasm of unspecified part of right bronchus or lung: Secondary | ICD-10-CM | POA: Diagnosis not present

## 2018-02-25 DIAGNOSIS — C3491 Malignant neoplasm of unspecified part of right bronchus or lung: Secondary | ICD-10-CM | POA: Diagnosis not present

## 2018-02-25 DIAGNOSIS — C3490 Malignant neoplasm of unspecified part of unspecified bronchus or lung: Secondary | ICD-10-CM | POA: Diagnosis not present

## 2018-02-25 DIAGNOSIS — Z006 Encounter for examination for normal comparison and control in clinical research program: Secondary | ICD-10-CM | POA: Diagnosis not present

## 2018-02-26 DIAGNOSIS — R630 Anorexia: Secondary | ICD-10-CM | POA: Diagnosis not present

## 2018-02-26 DIAGNOSIS — R530 Neoplastic (malignant) related fatigue: Secondary | ICD-10-CM | POA: Diagnosis not present

## 2018-02-26 DIAGNOSIS — R05 Cough: Secondary | ICD-10-CM | POA: Diagnosis not present

## 2018-02-26 DIAGNOSIS — Z515 Encounter for palliative care: Secondary | ICD-10-CM | POA: Diagnosis not present

## 2018-03-19 DIAGNOSIS — R05 Cough: Secondary | ICD-10-CM | POA: Diagnosis not present

## 2018-03-19 DIAGNOSIS — Z515 Encounter for palliative care: Secondary | ICD-10-CM | POA: Diagnosis not present

## 2018-03-19 DIAGNOSIS — C3490 Malignant neoplasm of unspecified part of unspecified bronchus or lung: Secondary | ICD-10-CM | POA: Diagnosis not present

## 2018-03-19 DIAGNOSIS — K5903 Drug induced constipation: Secondary | ICD-10-CM | POA: Diagnosis not present

## 2018-03-19 DIAGNOSIS — Z23 Encounter for immunization: Secondary | ICD-10-CM | POA: Diagnosis not present

## 2018-03-19 DIAGNOSIS — Z87891 Personal history of nicotine dependence: Secondary | ICD-10-CM | POA: Diagnosis not present

## 2018-03-19 DIAGNOSIS — R630 Anorexia: Secondary | ICD-10-CM | POA: Diagnosis not present

## 2018-03-26 ENCOUNTER — Other Ambulatory Visit: Payer: Self-pay | Admitting: Physician Assistant

## 2018-03-26 DIAGNOSIS — E119 Type 2 diabetes mellitus without complications: Secondary | ICD-10-CM

## 2018-03-29 ENCOUNTER — Ambulatory Visit (INDEPENDENT_AMBULATORY_CARE_PROVIDER_SITE_OTHER): Payer: Medicare Other | Admitting: Physician Assistant

## 2018-03-29 ENCOUNTER — Encounter: Payer: Self-pay | Admitting: Physician Assistant

## 2018-03-29 VITALS — BP 124/87 | HR 108 | Temp 97.5°F | Wt 156.0 lb

## 2018-03-29 DIAGNOSIS — C3491 Malignant neoplasm of unspecified part of right bronchus or lung: Secondary | ICD-10-CM

## 2018-03-29 DIAGNOSIS — I119 Hypertensive heart disease without heart failure: Secondary | ICD-10-CM

## 2018-03-29 DIAGNOSIS — E119 Type 2 diabetes mellitus without complications: Secondary | ICD-10-CM

## 2018-03-29 DIAGNOSIS — R1013 Epigastric pain: Secondary | ICD-10-CM

## 2018-03-29 DIAGNOSIS — T451X5A Adverse effect of antineoplastic and immunosuppressive drugs, initial encounter: Secondary | ICD-10-CM

## 2018-03-29 DIAGNOSIS — R634 Abnormal weight loss: Secondary | ICD-10-CM | POA: Diagnosis not present

## 2018-03-29 DIAGNOSIS — Z79899 Other long term (current) drug therapy: Secondary | ICD-10-CM | POA: Diagnosis not present

## 2018-03-29 DIAGNOSIS — Z515 Encounter for palliative care: Secondary | ICD-10-CM | POA: Diagnosis not present

## 2018-03-29 DIAGNOSIS — I2510 Atherosclerotic heart disease of native coronary artery without angina pectoris: Secondary | ICD-10-CM | POA: Diagnosis not present

## 2018-03-29 LAB — POCT GLYCOSYLATED HEMOGLOBIN (HGB A1C): HBA1C, POC (CONTROLLED DIABETIC RANGE): 6.7 % (ref 0.0–7.0)

## 2018-03-29 MED ORDER — METOPROLOL SUCCINATE ER 25 MG PO TB24: 25.0000 mg | ORAL_TABLET | Freq: Two times a day (BID) | ORAL | 0 refills | Status: DC

## 2018-03-29 MED ORDER — FAMOTIDINE 40 MG PO TABS: 40.0000 mg | ORAL_TABLET | Freq: Every day | ORAL | 0 refills | Status: DC | PRN

## 2018-03-29 NOTE — Progress Notes (Signed)
HPI:                                                                Randy [name] is a 72 y.o. 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 He is participating in a clinical trial. Oncologist is Janean Sark PA-C at [facility]  His main concerns today are increased heart rate, heartburn, and insomnia  States that his heart rate has been increased in the 90's to low 100's despite his beta blocker. Currently takes Toprol XL 1 tab in the morning and 1/2 tablet in the evening. Denies CP, orthopnea, PND.  He was recently started on Mirtazapine for insomnia and weight loss. Reports "it's not working." He currently naps throughout the day for several hours at a time. Wife states he is mostly asleep during that day and so he has trouble falling asleep at night. He denies any pain keeping him awake.   He also complains of heartburn and indigestion. Wife states he does not have much of an appetite and he has lost 30 pounds in the last 3 months. Denies hematochezia/melena, hematemesis, abdominal pain, change in bowel habits. Dysphagia.  Past Medical History:  Diagnosis Date  . 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 Repeat US due 2023  . Carotid arterial disease ([place])    a. S/p R CEA;  b. 05/2015 Carotid U/S: RICA 6-30%, LICA 16-01%.  . Coronary artery disease    a. 2012 MI in [place] w/ PCI  //  b. 04/2013 MV: inflat scar, no ischemia.  //  c.   NSTEMI 5/17 >> Promus DES to LCx and POBA to OM1 (ISR).   . Diabetes mellitus without complication ([place])   . Grade II diastolic dysfunction 09/32/3557  . History of cardiac catheterization 10/21/15   a. NSTEMI 5/17: oLAD 40, pLCx 90 (Promus DES), OM2 90 ISR (POBA), oRCA 25   . History of  echocardiogram    a. Mild LVH, EF 50-55%, possible inferolateral HK, grade 2 diastolic dysfunction, trivial AI, MAC  . History of nuclear stress test 04/2013   a. Myoview 12/14: Intermediate risk, prior inferolateral MI, no ischemia, EF 45%  . Hx of leukocytosis   . Hyperlipidemia   . Hypertension   . Hypertensive heart disease   . Morbid obesity ([place])   . Tobacco abuse    Past Surgical History:  Procedure Laterality Date  . CARDIAC CATHETERIZATION    . CARDIAC CATHETERIZATION N/A 10/21/2015   Procedure: Left Heart Cath and Coronary Angiography;  Surgeon: [name], MD;  Location: [place];  Service: Cardiovascular;  Laterality: N/A;  . CARDIAC CATHETERIZATION N/A 10/21/2015   Procedure: Coronary Stent Intervention;  Surgeon: [name], MD;  Location: [place];  Service: Cardiovascular;  Laterality: N/A;  . CARDIAC CATHETERIZATION N/A 10/21/2015   Procedure: Coronary Balloon Angioplasty;  Surgeon: [name], MD;  Location: [facility] [facility];  Service: Cardiovascular;  Laterality: N/A;  . CAROTID ENDARTERECTOMY    . CORONARY ANGIOPLASTY    .  CORONARY STENT PLACEMENT    . VIDEO BRONCHOSCOPY Bilateral 04/03/2017   Procedure: VIDEO BRONCHOSCOPY WITH FLUORO;  Surgeon: [name], MD;  Location: [facility];  Service: Cardiopulmonary;  Laterality: Bilateral;   Social History   Tobacco Use  . Smoking status: Former Smoker    Packs/day: 0.75    Years: 40.00    Pack years: 30.00    Types: Cigarettes    Start date: 06/18/1972    Last attempt to quit: 10/24/2015    Years since quitting: 2.4  . Smokeless tobacco: Never Used  Substance Use Topics  . Alcohol use: No    Alcohol/week: 0.0 standard drinks   family history includes Hypertension in his father; Other in his mother.    ROS: Review of Systems  Constitutional: Positive for malaise/fatigue and weight loss.       + anorexia  Respiratory: Positive for cough.       Medications: Current Outpatient Medications  Medication Sig Dispense Refill  . benzonatate (TESSALON) 100 MG capsule Take by mouth.    [name] aspirin EC 81 MG tablet Take 81 mg by mouth daily.    [name] atorvastatin (LIPITOR) 40 MG tablet TAKE 1 TABLET BY MOUTH EVERY DAY 90 tablet 3  . brimonidine (ALPHAGAN) 0.2 % ophthalmic solution Place 3 drops into both eyes as needed. Use as directed    . chlorpheniramine-HYDROcodone (TUSSIONEX) 10-8 MG/5ML SUER Take 5 mLs by mouth every 12 (twelve) hours as needed for cough. 120 mL 0  . clopidogrel (PLAVIX) 75 MG tablet Take 1 tablet (75 mg total) by mouth daily. 90 tablet 3  . famotidine (PEPCID) 40 MG tablet Take 1 tablet (40 mg total) by mouth daily as needed for heartburn or indigestion. 90 tablet 0  . metFORMIN (GLUCOPHAGE) 500 MG tablet TAKE 1/2 (ONE-HALF) TABLET BY MOUTH TWICE DAILY WITH A MEAL 90 tablet 0  . metoprolol succinate (TOPROL-XL) 25 MG 24 hr tablet Take 1 tablet (25 mg total) by mouth 2 (two) times daily. 180 tablet 0  . mirtazapine (REMERON) 15 MG tablet Take 15 mg by mouth at bedtime.    Vladimir Faster Glycol-Propyl Glycol 0.4-0.3 % SOLN Place 1 drop into both eyes as needed for dry eyes.    . prednisoLONE acetate (PRED FORTE) 1 % ophthalmic suspension Place 3 drops into both eyes. Use as directed.    . VENTOLIN HFA 108 (90 Base) MCG/ACT inhaler Inhale 1 puff every 4 (four) hours as needed into the lungs for wheezing or shortness of breath. 1 Inhaler 5   No current facility-administered medications for this visit.    Allergies  Allergen Reactions  . Brilinta [Ticagrelor] Shortness Of Breath       Objective:  BP 124/87   Pulse (!) 108   Temp (!) 97.5 F (36.4 C) (Oral)   Wt 156 lb (70.8 kg)   SpO2 95%   BMI 21.76 kg/m  Gen:  alert, ill-appearing, not toxic-appearing, no distress, appropriate for age 19: head normocephalic without obvious abnormality, conjunctiva and cornea clear, trachea midline Pulm: Normal work of  breathing, normal phonation, clear to auscultation bilaterally, no wheezes, rales or rhonchi CV: mildly tachycardic rate, regular rhythm, s1 and s2 distinct, no murmurs, clicks or rubs  Neuro: alert and oriented x 3 MSK: extremities atraumatic, normal gait and station Skin: intact, no rashes on exposed skin, no jaundice, no cyanosis Psych: appearance casual, cooperative, good eye contact, euthymic mood, affect mood-congruent, speech is articulate, and thought processes clear and goal-directed  Results for orders placed or performed in visit on 03/29/18 (from the past 72 hour(s))  POCT HgB A1C     Status: None   Collection Time: 03/29/18  3:23 PM  Result Value Ref Range   Hemoglobin A1C     HbA1c POC (<> result, manual entry)     HbA1c, POC (prediabetic range)     HbA1c, POC (controlled diabetic range) 6.7 0.0 - 7.0 %   No results found.    Assessment and Plan: 72 y.o. male with   .Diagnoses and all orders for this visit:  Stage IV squamous cell carcinoma of right lung (HCC)  Diabetes mellitus without complication (HCC) -     POCT HgB A1C  Hypertensive heart disease without heart failure -     metoprolol succinate (TOPROL-XL) 25 MG 24 hr tablet; Take 1 tablet (25 mg total) by mouth 2 (two) times daily.  Dyspepsia -     famotidine (PEPCID) 40 MG tablet; Take 1 tablet (40 mg total) by mouth daily as needed for heartburn or indigestion.  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 Counseled on high-protein high calorie diet.  Pepcid prn for dyspepsia  Tachycardia is mild. He had a normal 24-hr Holter on 12/13/17. I gave him the option to increase his Metoprolol to 25 mg twice a day and to back off he is having hypotension or lightheadedness.   Patient education and anticipatory guidance given Patient agrees with treatment  plan Follow-up in 3 months or sooner as needed if symptoms worsen or fail to improve  Darlyne Russian PA-C

## 2018-03-29 NOTE — Patient Instructions (Addendum)
Continue Mirtazapine 15 mg in the evening for appetite / weight  Increase your Metoprolol to 1 tablet twice a day You can hold your second dose if your BP is low (<110/60) or if you feel lightheaded Your BP goal is 120-140 / 70-90   High-Protein and High-Calorie Diet Eating high-protein and high-calorie foods can help you to gain weight, heal after an injury, and recover after an illness or surgery. What is my plan? The specific amount of daily protein and calories you need depends on:  Your body weight.  The reason this diet is recommended for you.  Generally, a high-protein, high-calorie diet involves:  Eating 250-500 extra calories each day.  Making sure that 10-35% of your daily calories come from protein.  Talk to your health care provider about how much protein and how many calories you need each day. Follow the diet as directed by your health care provider. What do I need to know about this diet?  Ask your health care provider if you should take a nutritional supplement.  Try to eat six small meals each day instead of three large meals.  Eat a balanced diet, including one food that is high in protein at each meal.  Keep nutritious snacks handy, such as nuts, trail mixes, dried fruit, and yogurt.  If you have kidney disease or diabetes, eating too much protein may put extra stress on your kidneys. Talk to your health care provider if you have either of those conditions. What are some high-protein foods? Grains Quinoa. Bulgur wheat. Vegetables Soybeans. Peas. Meats and Other Protein Sources Beef, pork, and poultry. Fish and seafood. Eggs. Tofu. Textured vegetable protein (TVP). Peanut butter. Nuts and seeds. Dried beans. Protein powders. Dairy Whole milk. Whole-milk yogurt. Powdered milk. Cheese. Yahoo. Eggnog. Beverages High-protein supplement drinks. Soy milk. Other Protein bars. The items listed above may not be a complete list of recommended foods or  beverages. Contact your dietitian for more options. What are some high-calorie foods? Grains Pasta. Quick breads. Muffins. Pancakes. Ready-to-eat cereal. Vegetables Vegetables cooked in oil or butter. Fried potatoes. Fruits Dried fruit. Fruit leather. Canned fruit in syrup. Fruit juice. Avocados. Meats and Other Protein Sources Peanut butter. Nuts and seeds. Dairy Heavy cream. Whipped cream. Cream cheese. Sour cream. Ice cream. Custard. Pudding. Beverages Meal-replacement beverages. Nutrition shakes. Fruit juice. Sugar-sweetened soft drinks. Condiments Salad dressing. Mayonnaise. Alfredo sauce. Fruit preserves or jelly. Honey. Syrup. Sweets/Desserts Cake. Cookies. Pie. Pastries. Candy bars. Chocolate. Fats and Oils Butter or margarine. Oil. Gravy. Other Meal-replacement bars. The items listed above may not be a complete list of recommended foods or beverages. Contact your dietitian for more options. What are some tips for including high-protein and high-calorie foods in my diet?  Add whole milk, half-and-half, or heavy cream to cereal, pudding, soup, or hot cocoa.  Add whole milk to instant breakfast drinks.  Add peanut butter to oatmeal or smoothies.  Add powdered milk to baked goods, smoothies, or milkshakes.  Add powdered milk, cream, or butter to mashed potatoes.  Add cheese to cooked vegetables.  Make whole-milk yogurt parfaits. Top them with granola, fruit, or nuts.  Add cottage cheese to your fruit.  Add avocados, cheese, or both to sandwiches or salads.  Add meat, poultry, or seafood to rice, pasta, casseroles, salads, and soups.  Use mayonnaise when making egg salad, chicken salad, or tuna salad.  Use peanut butter as a topping for pretzels, celery, or crackers.  Add beans to casseroles, dips, and spreads.  Add pureed beans to sauces and soups.  Replace calorie-free drinks with calorie-containing drinks, such as milk and fruit juice. This information is  not intended to replace advice given to you by your health care provider. Make sure you discuss any questions you have with your health care provider. Document Released: 05/15/2005 Document Revised: 10/21/2015 Document Reviewed: 10/28/2013 Elsevier Interactive Patient Education  [employer].

## 2018-04-04 DIAGNOSIS — Z79899 Other long term (current) drug therapy: Secondary | ICD-10-CM | POA: Insufficient documentation

## 2018-04-04 DIAGNOSIS — R634 Abnormal weight loss: Secondary | ICD-10-CM | POA: Insufficient documentation

## 2018-04-04 DIAGNOSIS — R1013 Epigastric pain: Secondary | ICD-10-CM | POA: Insufficient documentation

## 2018-04-04 DIAGNOSIS — T451X5A Adverse effect of antineoplastic and immunosuppressive drugs, initial encounter: Secondary | ICD-10-CM | POA: Insufficient documentation

## 2018-04-08 DIAGNOSIS — Z006 Encounter for examination for normal comparison and control in clinical research program: Secondary | ICD-10-CM | POA: Diagnosis not present

## 2018-04-08 DIAGNOSIS — C3490 Malignant neoplasm of unspecified part of unspecified bronchus or lung: Secondary | ICD-10-CM | POA: Diagnosis not present

## 2018-04-08 DIAGNOSIS — R918 Other nonspecific abnormal finding of lung field: Secondary | ICD-10-CM | POA: Diagnosis not present

## 2018-04-08 DIAGNOSIS — J90 Pleural effusion, not elsewhere classified: Secondary | ICD-10-CM | POA: Diagnosis not present

## 2018-04-08 DIAGNOSIS — J188 Other pneumonia, unspecified organism: Secondary | ICD-10-CM | POA: Diagnosis not present

## 2018-04-08 DIAGNOSIS — J181 Lobar pneumonia, unspecified organism: Secondary | ICD-10-CM | POA: Diagnosis not present

## 2018-04-08 DIAGNOSIS — J479 Bronchiectasis, uncomplicated: Secondary | ICD-10-CM | POA: Diagnosis not present

## 2018-04-09 DIAGNOSIS — J189 Pneumonia, unspecified organism: Secondary | ICD-10-CM | POA: Diagnosis not present

## 2018-04-09 DIAGNOSIS — Z79899 Other long term (current) drug therapy: Secondary | ICD-10-CM | POA: Diagnosis not present

## 2018-04-09 DIAGNOSIS — R000 Tachycardia, unspecified: Secondary | ICD-10-CM | POA: Diagnosis not present

## 2018-04-09 DIAGNOSIS — C3411 Malignant neoplasm of upper lobe, right bronchus or lung: Secondary | ICD-10-CM | POA: Diagnosis not present

## 2018-04-12 DIAGNOSIS — R2689 Other abnormalities of gait and mobility: Secondary | ICD-10-CM | POA: Diagnosis not present

## 2018-04-12 DIAGNOSIS — C3490 Malignant neoplasm of unspecified part of unspecified bronchus or lung: Secondary | ICD-10-CM | POA: Diagnosis not present

## 2018-04-16 DIAGNOSIS — K5900 Constipation, unspecified: Secondary | ICD-10-CM | POA: Diagnosis not present

## 2018-04-16 DIAGNOSIS — R5383 Other fatigue: Secondary | ICD-10-CM | POA: Diagnosis not present

## 2018-04-16 DIAGNOSIS — Z7189 Other specified counseling: Secondary | ICD-10-CM | POA: Diagnosis not present

## 2018-04-16 DIAGNOSIS — C3411 Malignant neoplasm of upper lobe, right bronchus or lung: Secondary | ICD-10-CM | POA: Diagnosis not present

## 2018-04-16 DIAGNOSIS — J189 Pneumonia, unspecified organism: Secondary | ICD-10-CM | POA: Diagnosis not present

## 2018-04-16 DIAGNOSIS — R443 Hallucinations, unspecified: Secondary | ICD-10-CM | POA: Diagnosis not present

## 2018-04-16 DIAGNOSIS — Z87891 Personal history of nicotine dependence: Secondary | ICD-10-CM | POA: Diagnosis not present

## 2018-04-16 DIAGNOSIS — J90 Pleural effusion, not elsewhere classified: Secondary | ICD-10-CM | POA: Diagnosis not present

## 2018-04-16 DIAGNOSIS — R05 Cough: Secondary | ICD-10-CM | POA: Diagnosis not present

## 2018-04-16 DIAGNOSIS — R5381 Other malaise: Secondary | ICD-10-CM | POA: Diagnosis not present

## 2018-04-16 DIAGNOSIS — C3490 Malignant neoplasm of unspecified part of unspecified bronchus or lung: Secondary | ICD-10-CM | POA: Diagnosis not present

## 2018-04-16 DIAGNOSIS — C7971 Secondary malignant neoplasm of right adrenal gland: Secondary | ICD-10-CM | POA: Diagnosis not present

## 2018-04-16 DIAGNOSIS — R630 Anorexia: Secondary | ICD-10-CM | POA: Diagnosis not present

## 2018-04-30 DIAGNOSIS — R530 Neoplastic (malignant) related fatigue: Secondary | ICD-10-CM | POA: Diagnosis not present

## 2018-04-30 DIAGNOSIS — R630 Anorexia: Secondary | ICD-10-CM | POA: Diagnosis not present

## 2018-04-30 DIAGNOSIS — R05 Cough: Secondary | ICD-10-CM | POA: Diagnosis not present

## 2018-04-30 DIAGNOSIS — K5900 Constipation, unspecified: Secondary | ICD-10-CM | POA: Diagnosis not present

## 2018-04-30 DIAGNOSIS — C7971 Secondary malignant neoplasm of right adrenal gland: Secondary | ICD-10-CM | POA: Diagnosis not present

## 2018-04-30 DIAGNOSIS — R5381 Other malaise: Secondary | ICD-10-CM | POA: Diagnosis not present

## 2018-04-30 DIAGNOSIS — C3491 Malignant neoplasm of unspecified part of right bronchus or lung: Secondary | ICD-10-CM | POA: Diagnosis not present

## 2018-04-30 DIAGNOSIS — J189 Pneumonia, unspecified organism: Secondary | ICD-10-CM | POA: Diagnosis not present

## 2018-05-02 DIAGNOSIS — H2513 Age-related nuclear cataract, bilateral: Secondary | ICD-10-CM | POA: Diagnosis not present

## 2018-05-02 DIAGNOSIS — H3581 Retinal edema: Secondary | ICD-10-CM | POA: Diagnosis not present

## 2018-05-02 DIAGNOSIS — H16211 Exposure keratoconjunctivitis, right eye: Secondary | ICD-10-CM | POA: Diagnosis not present

## 2018-05-02 DIAGNOSIS — S0501XA Injury of conjunctiva and corneal abrasion without foreign body, right eye, initial encounter: Secondary | ICD-10-CM | POA: Diagnosis not present

## 2018-05-06 ENCOUNTER — Ambulatory Visit (INDEPENDENT_AMBULATORY_CARE_PROVIDER_SITE_OTHER): Payer: Medicare Other | Admitting: Family Medicine

## 2018-05-06 ENCOUNTER — Ambulatory Visit (INDEPENDENT_AMBULATORY_CARE_PROVIDER_SITE_OTHER): Payer: Medicare Other | Admitting: Internal Medicine

## 2018-05-06 ENCOUNTER — Encounter: Payer: Self-pay | Admitting: Family Medicine

## 2018-05-06 ENCOUNTER — Encounter: Payer: Self-pay | Admitting: Internal Medicine

## 2018-05-06 VITALS — BP 119/81 | HR 106 | Wt 150.0 lb

## 2018-05-06 VITALS — BP 128/72 | HR 101 | Ht 71.0 in | Wt 148.4 lb

## 2018-05-06 DIAGNOSIS — C3491 Malignant neoplasm of unspecified part of right bronchus or lung: Secondary | ICD-10-CM | POA: Diagnosis not present

## 2018-05-06 DIAGNOSIS — M546 Pain in thoracic spine: Secondary | ICD-10-CM

## 2018-05-06 DIAGNOSIS — E782 Mixed hyperlipidemia: Secondary | ICD-10-CM | POA: Diagnosis not present

## 2018-05-06 DIAGNOSIS — I10 Essential (primary) hypertension: Secondary | ICD-10-CM | POA: Diagnosis not present

## 2018-05-06 DIAGNOSIS — I2510 Atherosclerotic heart disease of native coronary artery without angina pectoris: Secondary | ICD-10-CM

## 2018-05-06 DIAGNOSIS — E43 Unspecified severe protein-calorie malnutrition: Secondary | ICD-10-CM

## 2018-05-06 MED ORDER — METOPROLOL SUCCINATE ER 25 MG PO TB24: 50.0000 mg | ORAL_TABLET | Freq: Two times a day (BID) | ORAL | 11 refills | Status: DC

## 2018-05-06 NOTE — Progress Notes (Signed)
Randy [name] is a 72 y.o. male who presents to [place]: [place] today for right thoracic back pain.  Randy [name] has a pertinent medical history for metastatic lung cancer.  He is currently in the process of being established with hospice as he is at the end of his life.  He notes right thoracic back pain.  This is been ongoing for a few weeks now.  He denies any recent injury.  He denies any new or severe shortness of breath.  Denies significant radiating pain.  He had a similar episode of left thoracic back pain in February 2019 that was well treated with physical therapy.  He wonders if he could benefit from physical therapy.  He has tried some light massage at home and some existing prescription for hydrocodone which is helped a bit.  He is tried a leftover cyclobenzaprine which did not help.  He notes that he is unable to walk more than about 100 feet without becoming fatigued and having to stop for rest.     ROS as above:  Exam:  BP 119/81   Pulse (!) 106   Wt 150 lb (68 kg)   BMI 20.92 kg/m  Wt Readings from Last 5 Encounters:  05/06/18 150 lb (68 kg)  05/06/18 148 lb 6.4 oz (67.3 kg)  03/29/18 156 lb (70.8 kg)  12/24/17 186 lb 3.2 oz (84.5 kg)  11/12/17 200 lb 9.6 oz (91 kg)    Gen: Cachectic ill-appearing man HEENT: EOMI,  MMM Lungs: Coarse breath sounds bilaterally Heart: Tachycardia no MRG Abd: NABS, Soft. Nondistended, Nontender Exts: Decreased muscle bulk.  Cachectic appearing MSK: Nontender to cervical and thoracic midline.  Tender palpation around rhomboid and trapezius.  Normal scapular motion.  Lab and Radiology Results Notes and x-ray reports from [facility] including chest abdomen CT scan showing metastatic lung cancer reviewed.   Assessment and Plan: 72 y.o. male with right thoracic back pain in the setting of end-stage metastatic lung  cancer.  I believe the pain is probably more musculoskeletal related then metastatic tumor invasion into the rib or right thoracic chest wall.  If you are in better health he may actually be a good candidate for physical therapy however he has poor functional status and I do not think he will be able to actively participate in physical therapy.  Also think he may be worsened with some physical therapy.  I think is reasonable to treat his pain pragmatically with continued hydrocodone which she does have a prescription of.  Additionally recommend heating pad Aspercreme or icy hot as well as lidocaine patches.   Additionally I spent time discussing hospice.  This subject has already been broached by his oncology team including a palliative care doctor.  He has yet to call and schedule for hospice.  I think he be a good candidate for this.  We discussed things such as a action plan for air hunger or severe pain in the middle of the night.  Discussed that the goal is to keep him out of the hospital and comfortable but hospice does not mean that he cannot have other care.  I spent 25 minutes with this patient, greater than 50% was face-to-face time counseling regarding differential diagnosis and plan.   Historical information moved to improve visibility of documentation.  Past Medical History:  Diagnosis Date  . 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 Repeat US due 2023  .  Carotid arterial disease ([place])    a. S/p R CEA;  b. 05/2015 Carotid U/S: RICA 9-02%, LICA 40-97%.  . Coronary artery disease    a. 2012 MI in [place] w/ PCI  //  b. 04/2013 MV: inflat scar, no ischemia.  //  c.   NSTEMI 5/17 >> Promus DES to LCx and POBA to OM1 (ISR).   . Diabetes mellitus without complication ([place])   . Grade II diastolic dysfunction 35/32/9924  . History of cardiac catheterization 10/21/15   a. NSTEMI 5/17: oLAD 40, pLCx 90 (Promus DES), OM2 90 ISR (POBA), oRCA 25   . History of echocardiogram    a. Mild  LVH, EF 50-55%, possible inferolateral HK, grade 2 diastolic dysfunction, trivial AI, MAC  . History of nuclear stress test 04/2013   a. Myoview 12/14: Intermediate risk, prior inferolateral MI, no ischemia, EF 45%  . Hx of leukocytosis   . Hyperlipidemia   . Hypertension   . Hypertensive heart disease   . Morbid obesity ([place])   . Tobacco abuse    Past Surgical History:  Procedure Laterality Date  . CARDIAC CATHETERIZATION    . CARDIAC CATHETERIZATION N/A 10/21/2015   Procedure: Left Heart Cath and Coronary Angiography;  Surgeon: [name], MD;  Location: [place] [facility];  Service: Cardiovascular;  Laterality: N/A;  . CARDIAC CATHETERIZATION N/A 10/21/2015   Procedure: Coronary Stent Intervention;  Surgeon: [name], MD;  Location: [place] [facility];  Service: Cardiovascular;  Laterality: N/A;  . CARDIAC CATHETERIZATION N/A 10/21/2015   Procedure: Coronary Balloon Angioplasty;  Surgeon: [name], MD;  Location: [place] [facility];  Service: Cardiovascular;  Laterality: N/A;  . CAROTID ENDARTERECTOMY    . CORONARY ANGIOPLASTY    . CORONARY STENT PLACEMENT    . VIDEO BRONCHOSCOPY Bilateral 04/03/2017   Procedure: VIDEO BRONCHOSCOPY WITH FLUORO;  Surgeon: [name], MD;  Location: [facility];  Service: Cardiopulmonary;  Laterality: Bilateral;   Social History   Tobacco Use  . Smoking status: Former Smoker    Packs/day: 0.75    Years: 40.00    Pack years: 30.00    Types: Cigarettes    Start date: 06/18/1972    Last attempt to quit: 10/24/2015    Years since quitting: 2.5  . Smokeless tobacco: Never Used  Substance Use Topics  . Alcohol use: No    Alcohol/week: 0.0 standard drinks   family history includes Hypertension in his father; Other in his mother.  Medications: Current Outpatient Medications  Medication Sig Dispense Refill  . aspirin EC 81 MG tablet Take 81 mg by mouth daily.    [name] atorvastatin (LIPITOR) 40 MG tablet TAKE 1  TABLET BY MOUTH EVERY DAY 90 tablet 3  . benzonatate (TESSALON) 100 MG capsule Take 100 mg by mouth as directed.     . brimonidine (ALPHAGAN) 0.2 % ophthalmic solution Place 3 drops into both eyes as needed. Use as directed    . chlorpheniramine-HYDROcodone (TUSSIONEX) 10-8 MG/5ML SUER Take 5 mLs by mouth every 12 (twelve) hours as needed for cough. 120 mL 0  . clopidogrel (PLAVIX) 75 MG tablet Take 1 tablet (75 mg total) by mouth daily. 90 tablet 3  . famotidine (PEPCID) 40 MG tablet Take 1 tablet (40 mg total) by mouth daily as needed for heartburn or indigestion. 90 tablet 0  . metFORMIN (GLUCOPHAGE) 500 MG tablet TAKE 1/2 (ONE-HALF) TABLET BY MOUTH TWICE DAILY WITH A MEAL 90 tablet 0  . metoprolol  succinate (TOPROL-XL) 25 MG 24 hr tablet Take 2 tablets (50 mg total) by mouth 2 (two) times daily. 120 tablet 11  . mirtazapine (REMERON) 15 MG tablet Take 15 mg by mouth at bedtime.    Vladimir Faster Glycol-Propyl Glycol 0.4-0.3 % SOLN Place 1 drop into both eyes as needed for dry eyes.    . prednisoLONE acetate (PRED FORTE) 1 % ophthalmic suspension Place 3 drops into both eyes. Use as directed.    . VENTOLIN HFA 108 (90 Base) MCG/ACT inhaler Inhale 1 puff every 4 (four) hours as needed into the lungs for wheezing or shortness of breath. 1 Inhaler 5   No current facility-administered medications for this visit.    Allergies  Allergen Reactions  . Brilinta [Ticagrelor] Shortness Of Breath     Discussed warning signs or symptoms. Please see discharge instructions. Patient expresses understanding.

## 2018-05-06 NOTE — Patient Instructions (Signed)
Medication Instructions:  Your physician has recommended you make the following change in your medication:  Your physician has recommended you make the following change in your medication:  1.) increase metoprolol succinate 25 mg to two tablets every morning and two tablets every evening   If you need a refill on your cardiac medications before your next appointment, please call your pharmacy.   Lab work: none If you have labs (blood work) drawn today and your tests are completely normal, you will receive your results only by: [name] MyChart Message (if you have MyChart) OR . A paper copy in the mail If you have any lab test that is abnormal or we need to change your treatment, we will call you to review the results.  Testing/Procedures: none  Follow-Up: At [facility], you and your health needs are our priority.  As part of our continuing mission to provide you with exceptional heart care, we have created designated Provider Care Teams.  These Care Teams include your primary Cardiologist (physician) and Advanced Practice Providers (APPs -  Physician Assistants and Nurse Practitioners) who all work together to provide you with the care you need, when you need it. You will need a follow up appointment in:  6 months.  Please call our office 2 months in advance to schedule this appointment.  You may see [name], MD or one of the following Advanced Practice Providers on your designated Care Team: Richardson Dopp, PA-C [place], [place] . Daune Perch, NP  Any Other Special Instructions Will Be Listed Below (If Applicable).

## 2018-05-06 NOTE — Patient Instructions (Signed)
Thank you for coming in today. I do recommend getting hospice in your home or at least talking with them.  Use your pain medicine as needed.  Use heating pad.  Use icy hot/or any menthol muscle rub. Also you can use over the counter asper cream.  Also you can use over the counter lidocaine patches (salon pas)   Recheck with me as needed.   I will talk with your doctor.   If you do want to try home health physical therapy let me know.

## 2018-05-06 NOTE — Progress Notes (Addendum)
Cardiology Office Note   Date:  05/06/2018   ID:  Randy [name], DOB [birth year], MRN [id number]  PCP:  [name], PA-C  Cardiologist:   [name], MD   F/U of CAD    History of Present Illness: Randy [name] is a 72 y.o. male with a history of CAD    S/p PCI in 2012 in [place]  Pt went on to have an NSTEMI in May 2017   Cardiac catheterization demonstrated 90% proximal LCx stenosis treated with a Promus DES and 95% OM1 in-stent restenosis treated with POBA (angioscope scoring balloon). 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 aspirin EC 81 MG tablet Take 81 mg by mouth daily.  [name] atorvastatin (LIPITOR) 40 MG tablet TAKE 1 TABLET BY MOUTH EVERY DAY  . benzonatate (TESSALON) 100 MG capsule Take 100 mg by mouth as directed.   . brimonidine (ALPHAGAN) 0.2 % ophthalmic solution Place 3 drops into both eyes as needed. Use as directed  . chlorpheniramine-HYDROcodone (TUSSIONEX) 10-8 MG/5ML SUER Take 5 mLs by mouth every 12 (twelve) hours as needed for cough.  . clopidogrel (PLAVIX) 75 MG tablet Take 1 tablet (75 mg total) by mouth daily.  . famotidine (PEPCID) 40 MG tablet Take 1 tablet (40 mg total) by mouth daily as needed for heartburn or indigestion.  . metFORMIN (GLUCOPHAGE) 500 MG tablet TAKE 1/2 (ONE-HALF) TABLET BY MOUTH TWICE DAILY WITH A MEAL  . metoprolol succinate (TOPROL-XL) 25 MG 24 hr tablet Take 1 tablet (25 mg total) by mouth 2 (two) times daily.  . mirtazapine (REMERON) 15 MG tablet Take 15 mg by mouth at bedtime.  Vladimir Faster Glycol-Propyl Glycol 0.4-0.3 % SOLN Place 1 drop into both eyes as needed for dry eyes.  . prednisoLONE acetate (PRED FORTE) 1 % ophthalmic suspension Place 3  drops into both eyes. Use as directed.  . VENTOLIN HFA 108 (90 Base) MCG/ACT inhaler Inhale 1 puff every 4 (four) hours as needed into the lungs for wheezing or shortness of breath.     Allergies:   Brilinta [ticagrelor]   Past Medical History:  Diagnosis Date  . 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 Repeat US due 2023  . Carotid arterial disease ([place])    a. S/p R CEA;  b. 05/2015 Carotid U/S: RICA 3-81%, LICA 82-99%.  . Coronary artery disease    a. 2012 MI in [place] w/ PCI  //  b. 04/2013 MV: inflat scar, no ischemia.  //  c.   NSTEMI 5/17 >> Promus DES to LCx and POBA to OM1 (ISR).   . Diabetes mellitus without complication ([place])   . Grade II diastolic dysfunction 37/16/9678  . History of cardiac catheterization 10/21/15   a. NSTEMI 5/17: oLAD 40, pLCx 90 (Promus DES), OM2 90 ISR (POBA), oRCA 25   . History of echocardiogram    a. Mild LVH, EF 50-55%, possible inferolateral HK, grade 2 diastolic dysfunction, trivial AI, MAC  . History of nuclear stress test 04/2013   a. Myoview 12/14: Intermediate risk, prior inferolateral MI, no ischemia, EF 45%  . Hx of leukocytosis   . Hyperlipidemia   .  Hypertension   . Hypertensive heart disease   . Morbid obesity ([place])   . Tobacco abuse     Past Surgical History:  Procedure Laterality Date  . CARDIAC CATHETERIZATION    . CARDIAC CATHETERIZATION N/A 10/21/2015   Procedure: Left Heart Cath and Coronary Angiography;  Surgeon: [name], MD;  Location: [place] [facility];  Service: Cardiovascular;  Laterality: N/A;  . CARDIAC CATHETERIZATION N/A 10/21/2015   Procedure: Coronary Stent Intervention;  Surgeon: [name], MD;  Location: [place] [facility];  Service: Cardiovascular;  Laterality: N/A;  . CARDIAC CATHETERIZATION N/A 10/21/2015   Procedure: Coronary Balloon Angioplasty;  Surgeon: [name], MD;  Location: [place] [facility];  Service: Cardiovascular;  Laterality: N/A;  . CAROTID  ENDARTERECTOMY    . CORONARY ANGIOPLASTY    . CORONARY STENT PLACEMENT    . VIDEO BRONCHOSCOPY Bilateral 04/03/2017   Procedure: VIDEO BRONCHOSCOPY WITH FLUORO;  Surgeon: [name], MD;  [facility];  Service: Cardiopulmonary;  Laterality: Bilateral;     Social History:  The patient  reports that he quit smoking about 2 years ago. His smoking use included cigarettes. He started smoking about 45 years ago. He has a 30.00 pack-year smoking history. He has never used smokeless tobacco. He reports that he does not drink alcohol or use drugs.   Family History:  The patient's family history includes Hypertension in his father; Other in his mother.    ROS:  Please see the history of present illness. All other systems are reviewed and  Negative to the above problem except as noted.    PHYSICAL EXAM: VS:  BP 128/72   Pulse (!) 101   Ht _0  (1.803 m)   Wt 148 lb 6.4 oz (67.3 kg)   SpO2 99%   BMI 20.70 kg/m   GEN:  Thin 72 yo  in no acute distress  HEENT: normal  Neck: no JVD, carotid bruits, or masses Cardiac: RRR; no murmurs, rubs, or gallops,no edema  Respiratory: Rhonchi   GI: soft, nontender, nondistended, + BS  No hepatomegaly  MS: no deformity Moving all extremities   Skin: warm and dry, no rash Neuro:  Strength and sensation are intact Psych: euthymic mood, full affect   EKG:  EKG is not ordered today.   Lipid Panel    Component Value Date/Time   CHOL 116 10/20/2016 1508   TRIG 149 10/20/2016 1508   HDL 31 (L) 10/20/2016 1508   CHOLHDL 3.7 10/20/2016 1508   CHOLHDL 3.2 12/24/2015 0809   VLDL 20 12/24/2015 0809   LDLCALC 55 10/20/2016 1508      Wt Readings from Last 3 Encounters:  05/06/18 148 lb 6.4 oz (67.3 kg)  03/29/18 156 lb (70.8 kg)  12/24/17 186 lb 3.2 oz (84.5 kg)      ASSESSMENT AND PLAN:  1  CAD No symptoms of angina   Last intervention was in 2017 with LCx (prox) and OM (instent restenosis)   He is on ASA and plavix   Follow  Hgb   If toerates   Hgb stable I would continue     2  Tachycardia    Pt with persistent sinus tach    Probably due to hypercatabolic state with lung CA He can increase b blocker Rx    Follow for symptoms (dizzines, SOB   )    3HTN   Follow as titrate meds      3  HL  Continue  statin     4   Lung Ca   Pt with profound wt loss  Continues to follow in oncology clinic    F/U next winter      Current medicines are reviewed at length with the patient today.  The patient does not have concerns regarding medicines.  Signed, [name], MD  05/06/2018 11:23 AM    [address], [place], [place]  [address] Phone: [phone]; Fax: [phone]

## 2018-05-07 DIAGNOSIS — C3490 Malignant neoplasm of unspecified part of unspecified bronchus or lung: Secondary | ICD-10-CM | POA: Diagnosis not present

## 2018-05-07 DIAGNOSIS — R05 Cough: Secondary | ICD-10-CM | POA: Diagnosis not present

## 2018-05-07 DIAGNOSIS — R627 Adult failure to thrive: Secondary | ICD-10-CM | POA: Diagnosis not present

## 2018-05-07 DIAGNOSIS — R5383 Other fatigue: Secondary | ICD-10-CM | POA: Diagnosis not present

## 2018-05-07 DIAGNOSIS — R530 Neoplastic (malignant) related fatigue: Secondary | ICD-10-CM | POA: Diagnosis not present

## 2018-05-07 DIAGNOSIS — R630 Anorexia: Secondary | ICD-10-CM | POA: Diagnosis not present

## 2018-05-10 ENCOUNTER — Telehealth: Payer: Self-pay | Admitting: *Deleted

## 2018-05-10 NOTE — Telephone Encounter (Signed)
Randy [name] left a message stating he is a former patient of Dr [name]. He would like an appt with Dr [name].

## 2018-05-11 NOTE — Telephone Encounter (Signed)
Okay to arrange an appointment for him whenever I have availability in the next few weeks.

## 2018-05-13 ENCOUNTER — Telehealth: Payer: Self-pay | Admitting: *Deleted

## 2018-05-13 NOTE — Telephone Encounter (Signed)
Pt completed trial at [facility] and requesting a f/u with MD. Message to scheduling.

## 2018-05-14 ENCOUNTER — Telehealth: Payer: Self-pay | Admitting: Internal Medicine

## 2018-05-14 NOTE — Telephone Encounter (Signed)
Scheduled appt per 12/16 sch message - left message  And sent reminder letter in the mail

## 2018-05-16 DIAGNOSIS — Z87891 Personal history of nicotine dependence: Secondary | ICD-10-CM | POA: Diagnosis not present

## 2018-05-16 DIAGNOSIS — H16211 Exposure keratoconjunctivitis, right eye: Secondary | ICD-10-CM | POA: Diagnosis not present

## 2018-05-16 DIAGNOSIS — H3581 Retinal edema: Secondary | ICD-10-CM | POA: Diagnosis not present

## 2018-05-16 DIAGNOSIS — S0501XD Injury of conjunctiva and corneal abrasion without foreign body, right eye, subsequent encounter: Secondary | ICD-10-CM | POA: Diagnosis not present

## 2018-05-16 DIAGNOSIS — H2513 Age-related nuclear cataract, bilateral: Secondary | ICD-10-CM | POA: Diagnosis not present

## 2018-05-16 DIAGNOSIS — S0501XA Injury of conjunctiva and corneal abrasion without foreign body, right eye, initial encounter: Secondary | ICD-10-CM | POA: Diagnosis not present

## 2018-05-16 DIAGNOSIS — Z85828 Personal history of other malignant neoplasm of skin: Secondary | ICD-10-CM | POA: Diagnosis not present

## 2018-05-16 DIAGNOSIS — Z85118 Personal history of other malignant neoplasm of bronchus and lung: Secondary | ICD-10-CM | POA: Diagnosis not present

## 2018-05-16 DIAGNOSIS — H189 Unspecified disorder of cornea: Secondary | ICD-10-CM | POA: Diagnosis not present

## 2018-05-17 ENCOUNTER — Encounter: Payer: Self-pay | Admitting: Physician Assistant

## 2018-05-17 ENCOUNTER — Telehealth: Payer: Self-pay

## 2018-05-17 MED ORDER — AMBULATORY NON FORMULARY MEDICATION: 0 refills | Status: DC

## 2018-05-17 NOTE — Telephone Encounter (Signed)
Wheelchair order for patient has been faxed to [facility]. They will call if they need more information.

## 2018-05-21 DIAGNOSIS — H02836 Dermatochalasis of left eye, unspecified eyelid: Secondary | ICD-10-CM | POA: Diagnosis not present

## 2018-05-21 DIAGNOSIS — H02203 Unspecified lagophthalmos right eye, unspecified eyelid: Secondary | ICD-10-CM | POA: Diagnosis not present

## 2018-05-21 DIAGNOSIS — S0501XD Injury of conjunctiva and corneal abrasion without foreign body, right eye, subsequent encounter: Secondary | ICD-10-CM | POA: Diagnosis not present

## 2018-05-21 DIAGNOSIS — S0501XA Injury of conjunctiva and corneal abrasion without foreign body, right eye, initial encounter: Secondary | ICD-10-CM | POA: Diagnosis not present

## 2018-05-23 ENCOUNTER — Telehealth: Payer: Self-pay | Admitting: Physician Assistant

## 2018-05-23 NOTE — Telephone Encounter (Signed)
Wheelchair documentation completed and office note from recent visit with Dr. Georgina Snell attached. Please fax to [facility]

## 2018-05-28 ENCOUNTER — Encounter: Payer: Self-pay | Admitting: Physician Assistant

## 2018-05-28 ENCOUNTER — Ambulatory Visit (INDEPENDENT_AMBULATORY_CARE_PROVIDER_SITE_OTHER): Payer: Medicare Other | Admitting: Physician Assistant

## 2018-05-28 ENCOUNTER — Telehealth: Payer: Self-pay

## 2018-05-28 ENCOUNTER — Ambulatory Visit (INDEPENDENT_AMBULATORY_CARE_PROVIDER_SITE_OTHER): Payer: Medicare Other

## 2018-05-28 VITALS — BP 118/83 | HR 92 | Wt 143.0 lb

## 2018-05-28 DIAGNOSIS — K5903 Drug induced constipation: Secondary | ICD-10-CM

## 2018-05-28 DIAGNOSIS — R630 Anorexia: Secondary | ICD-10-CM

## 2018-05-28 DIAGNOSIS — R634 Abnormal weight loss: Secondary | ICD-10-CM

## 2018-05-28 DIAGNOSIS — I700 Atherosclerosis of aorta: Secondary | ICD-10-CM

## 2018-05-28 DIAGNOSIS — I2510 Atherosclerotic heart disease of native coronary artery without angina pectoris: Secondary | ICD-10-CM

## 2018-05-28 DIAGNOSIS — R627 Adult failure to thrive: Secondary | ICD-10-CM | POA: Diagnosis not present

## 2018-05-28 DIAGNOSIS — K5900 Constipation, unspecified: Secondary | ICD-10-CM | POA: Diagnosis not present

## 2018-05-28 DIAGNOSIS — T402X5A Adverse effect of other opioids, initial encounter: Secondary | ICD-10-CM

## 2018-05-28 MED ORDER — NALOXEGOL OXALATE 25 MG PO TABS: 25.0000 mg | ORAL_TABLET | Freq: Every day | ORAL | 3 refills | Status: AC

## 2018-05-28 NOTE — Telephone Encounter (Signed)
Before leaving his appointment today Randy [name] stated that he is wanting an IT trainer wheelchair.  I contacted [facility] and they said that a Rx for a electric wheelchair along with supporting office notes needs to be faxed over to their office. Please advise. -EH/RMA

## 2018-05-28 NOTE — Patient Instructions (Addendum)
Stop Metformin Measure morning fasting blood sugar daily Log your readings Contact me for multiple high readings above 160 and low readings below 80 Follow-up in 2 months for A1c  Naloxegol oral tablets What is this medicine? NALOXEGOL (nal OX e GAHL) is used to treat constipation caused by opioids (pain medicine). Tell your health care professional if you stop taking your opioid pain medicine. This medicine may be used for other purposes; ask your health care provider or pharmacist if you have questions. COMMON BRAND NAME(S): MOVANTIK What should I tell my health care provider before I take this medicine? They need to know if you have any of these conditions: -cancer or tumor in abdomen, intestine, or stomach -diverticulitis -history of bowel blockage -history of stomach ulcer -inflammatory bowel disease -kidney disease -liver disease -Ogilvie's syndrome -stomach or intestine problems -an unusual or allergic reaction to naloxegol, other medicines, foods, dyes, or preservatives -pregnant or trying to get pregnant -breast-feeding How should I use this medicine? Take this medicine by mouth with a glass of water. Follow the directions on the prescription label. Swallow medicine whole. Do not cut or chew this medicine. The medicine may also be crushed and mixed with 4 ounces of water. Drink immediately after mixing. Take your medicine one time each day, on an empty stomach, at least 1 hour before your first meal of the day or 2 hours after the meal. If you are unable to tolerate your dose, inform your healthcare provider so that your dose can be adjusted. Do not take additional laxatives except on your healthcare provider's advice. Your healthcare provider may prescribe other laxatives if your medicine does not work well enough after 3 days of treatment. Tell your healthcare provider if you stop taking your pain medicine. If you stop taking your pain medicine, you should also stop taking this  medicine. Do not take your medicine more often than directed. Do not stop taking except on your doctor's advice. A special MedGuide will be given to you before each treatment. Be sure to read this information carefully each time. Talk to your pediatrician regarding the use of this medicine in children. Special care may be needed. Overdosage: If you think you have taken too much of this medicine contact a poison control center or emergency room at once. NOTE: This medicine is only for you. Do not share this medicine with others. What if I miss a dose? If you miss a dose, take it as soon as you can. If it is almost time for your next dose, take only that dose. Do not take double or extra doses. What may interact with this medicine? Do not take this medicine with any of the following medications: - certain antiviral medicines for HIV or AIDS -certain medicines for fungal infections like ketoconazole, itraconazole, posaconazole, and voriconazole -chloramphenicol -clarithromycin -conivaptan -dalfopristin; quinupristin -idelalisib -mifepristone -nefazodone -telithromycin This medicine may also interact with the following medications: -carbamazepine -diltiazem -erythromycin -grapefruit juice -methylnaltrexone -naloxone -naltrexone -rifampin -St. John's Wort -verapamil This list may not describe all possible interactions. Give your health care provider a list of all the medicines, herbs, non-prescription drugs, or dietary supplements you use. Also tell them if you smoke, drink alcohol, or use illegal drugs. Some items may interact with your medicine. What should I watch for while using this medicine? Visit your doctor for regular check ups. Tell your doctor if your symptoms do not get better or if they get worse. If you develop unusually persistent or worsening abdominal pain, stop  taking your medicine and seek medical attention. You may have symptoms of opioid withdrawal during treatment  with this medicine. Symptoms include sweating, chills, diarrhea, stomach pain, anxiety, irritability, and yawning. Tell your health care provider if you have any of these symptoms. Also, if you take methadone to treat your pain, you may be more likely to have stomach pain and diarrhea compared to people who do not take methadone. If you take too much of this medicine, call your healthcare provider or go to the nearest emergency room right away. What side effects may I notice from receiving this medicine? Side effects that you should report to your doctor or health care professional as soon as possible: -allergic reactions like skin rash, itching or hives, swelling of the face, lips, or tongue -new or worsening stomach pain -severe or prolonged diarrhea -signs and symptoms of opioid withdrawal such as sweating, chills, diarrhea, stomach pain, anxiety, irritability, and yawning Side effects that usually do not require medical attention (report to your doctor or health care professional if they continue or are bothersome): -diarrhea -headache -nausea -stomach gas -stomach pain -vomiting This list may not describe all possible side effects. Call your doctor for medical advice about side effects. You may report side effects to FDA at 1-800-FDA-1088. Where should I keep my medicine? Keep out of the reach of children. This medicine can be abused. Keep your medicine in a safe place to protect it from theft. Do not share this medicine with anyone. Selling or giving away this medicine is dangerous and against the law. Follow the directions in the [place]. Store at room temperature between 20 and 25 degrees C (68 and 77 degrees F). Throw away any unused medicine after the expiration date. NOTE: This sheet is a summary. It may not cover all possible information. If you have questions about this medicine, talk to your doctor, pharmacist, or health care provider.  2019 Elsevier/Gold Standard (2016-12-27  10:26:33)

## 2018-05-28 NOTE — Progress Notes (Signed)
HPI:                                                                Randy [name] is a 72 y.o. male who presents to [facility] today for "GI problems"  Endorses waxing and waning constipation and lower abdominal pain x 3 months. He does not feel dulcolax is effective. 1 dose does not cause a BM and 2 doses causes severe diarrhea. If he does not take a laxative he only passes very small amounts of hard, pebble-like stool and averages 1 Bm per week. He is currently taking Norco for cancer-related pain. Denies fever, chills, nausea, vomiting, or blood in stool.  He is still complaining of lack of appetite. He was on ramelteon, but states this "did nothing" and he self-discontinued it. Per Palliative Care, he had hallucinations and they recommended he switch to Dexamethasone, but patient did not start this.   At the end of the visit, he told the medical assistant that he wants a motorized wheelchair because he is unable to self-propel a lightweight wheelchair due to weakness. He is following with Dr. [name] for stage IV squamous cell carcinoma of the lung. He was going to a different physician at [facility] for a clinical trial, but the trial was not successful, so he wants to transfer his care back to [facility]. He has an appt in January.  He is also following with Dr. Veleta Miners, Palliative Care at [facility]. Most recent office visit on 05/08/18 his functional status was graded as ECOG 3. Hospice was recommended. They are not able to offer any additional treatment due to his weakness and poor functional status at this time.  Depression screen PHQ 2/9 03/12/2017  Decreased Interest 0  Down, Depressed, Hopeless 0  PHQ - 2 Score 0    No flowsheet data found.    Past Medical History:  Diagnosis Date  . 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 Repeat US due 2023  . Carotid arterial disease ([place])    a. S/p R CEA;  b. 05/2015 Carotid U/S:  RICA 3-26%, LICA 71-24%.  . Coronary artery disease    a. 2012 MI in [place] w/ PCI  //  b. 04/2013 MV: inflat scar, no ischemia.  //  c.   NSTEMI 5/17 >> Promus DES to LCx and POBA to OM1 (ISR).   . Diabetes mellitus without complication ([place])   . Grade II diastolic dysfunction 58/01/9832  . History of cardiac catheterization 10/21/15   a. NSTEMI 5/17: oLAD 40, pLCx 90 (Promus DES), OM2 90 ISR (POBA), oRCA 25   . History of echocardiogram    a. Mild LVH, EF 50-55%, possible inferolateral HK, grade 2 diastolic dysfunction, trivial AI, MAC  . History of nuclear stress test 04/2013   a. Myoview 12/14: Intermediate risk, prior inferolateral MI, no ischemia, EF 45%  . Hx of leukocytosis   . Hyperlipidemia   . Hypertension   . Hypertensive heart disease   . Morbid obesity ([place])   . Tobacco abuse    Past Surgical History:  Procedure Laterality Date  . CARDIAC CATHETERIZATION    . CARDIAC CATHETERIZATION N/A 10/21/2015   Procedure: Left Heart Cath and Coronary Angiography;  Surgeon:  [name], MD;  Location: [place] [facility];  Service: Cardiovascular;  Laterality: N/A;  . CARDIAC CATHETERIZATION N/A 10/21/2015   Procedure: Coronary Stent Intervention;  Surgeon: [name], MD;  Location: [place] [facility];  Service: Cardiovascular;  Laterality: N/A;  . CARDIAC CATHETERIZATION N/A 10/21/2015   Procedure: Coronary Balloon Angioplasty;  Surgeon: [name], MD;  Location: [place] [facility];  Service: Cardiovascular;  Laterality: N/A;  . CAROTID ENDARTERECTOMY    . CORONARY ANGIOPLASTY    . CORONARY STENT PLACEMENT    . VIDEO BRONCHOSCOPY Bilateral 04/03/2017   Procedure: VIDEO BRONCHOSCOPY WITH FLUORO;  Surgeon: [name], MD;  Location: [facility];  Service: Cardiopulmonary;  Laterality: Bilateral;   Social History   Tobacco Use  . Smoking status: Former Smoker    Packs/day: 0.75    Years: 40.00    Pack years: 30.00    Types: Cigarettes    Start  date: 06/18/1972    Last attempt to quit: 10/24/2015    Years since quitting: 2.6  . Smokeless tobacco: Never Used  Substance Use Topics  . Alcohol use: No    Alcohol/week: 0.0 standard drinks   family history includes Hypertension in his father; Other in his mother.    ROS: negative except as noted in the HPI  Medications: Current Outpatient Medications  Medication Sig Dispense Refill  . HYDROcodone-acetaminophen (NORCO/VICODIN) 5-325 MG tablet 1/2 to 1 tablet every 6 hours as needed for pain and cough    . AMBULATORY NON FORMULARY MEDICATION Wheelchair and accessories Diagnosis Stage IV squamous cell carcinoma of right lung - C34.91 1 each 0  . aspirin EC 81 MG tablet Take 81 mg by mouth daily.    [name] atorvastatin (LIPITOR) 40 MG tablet TAKE 1 TABLET BY MOUTH EVERY DAY 90 tablet 3  . brimonidine (ALPHAGAN) 0.2 % ophthalmic solution Place 3 drops into both eyes as needed. Use as directed    . Carboxymethylcellulose Sodium (THERATEARS) 0.25 % SOLN Apply to eye.    . metoprolol succinate (TOPROL-XL) 25 MG 24 hr tablet Take 2 tablets (50 mg total) by mouth 2 (two) times daily. 120 tablet 11  . naloxegol oxalate (MOVANTIK) 25 MG TABS tablet Take 1 tablet (25 mg total) by mouth daily after breakfast. Take 1h before or 2h after meal 30 tablet 3  . Polyethyl Glycol-Propyl Glycol 0.4-0.3 % SOLN Place 1 drop into both eyes as needed for dry eyes.     No current facility-administered medications for this visit.    Allergies  Allergen Reactions  . Brilinta [Ticagrelor] Shortness Of Breath       Objective:  BP 118/83   Pulse 92   Wt 143 lb (64.9 kg)   SpO2 100%   BMI 19.94 kg/m  Gen:  alert, appears pale and frail, not toxic-appearing, no acute distress HEENT: head normocephalic without obvious abnormality, right eyelid swollen with conjuncitval injection, left conjunctiva and cornea clear, trachea midline Pulm: Normal work of breathing, normal phonation, clear to auscultation  bilaterally, no wheezes, rales or rhonchi CV: Normal rate, regular rhythm, s1 and s2 distinct, no murmurs, clicks or rubs  GI: abdomen soft, mildly distended, non-tender, no guarding or rigidity Neuro: alert and oriented x 3, no tremor MSK: extremities atraumatic, normal gait and station Skin: intact, no rashes on exposed skin, no jaundice, no cyanosis  Lab Results  Component Value Date   HGBA1C 6.7 03/29/2018   Lab Results  Component Value Date   CREATININE  0.71 (L) 12/24/2017   BUN 6 (L) 12/24/2017   NA 132 (L) 12/24/2017   K 4.5 12/24/2017   CL 93 (L) 12/24/2017   CO2 23 12/24/2017   Lab Results  Component Value Date   ALT 8 11/12/2017   AST 12 11/12/2017   ALKPHOS 74 11/12/2017   BILITOT 0.6 11/12/2017   Lab Results  Component Value Date   WBC 11.5 (H) 11/12/2017   HGB 12.8 (L) 11/12/2017   HCT 40.3 11/12/2017   MCV 86.1 11/12/2017   PLT 270 11/12/2017    CT CHEST ABDOMEN PELVIS W CONTRAST (ROUTINE), 04/08/2018 10:36 AM  INDICATION:Per Protocol \ C34.90 Squamous cell carcinoma of lung, unspecified laterality ([place]) \ Z00.6 Examination of participant in clinical trial  COMPARISON: 02/25/2018  TECHNIQUE: Multislice axial images were obtained through the chest, abdomen, and pelvis with administration of iodinated intravenous contrast material. Multi-planar reformatted images were generated for additional analysis.    [facility] and its affiliates are committed to minimizing radiation dose to patients while maintaining necessary diagnostic image quality. All CT scans are therefore performed using "As Low As Reasonably Achievable (ALARA)" protocols with either manual or automated exposure controls calibrated to the age and size of each patient.  FINDINGS:  CHEST: . Thoracic inlet/axilla: Stable 1.1 cm hypoattenuating nodule and coarse calcification in the right thyroid lobe. Right IJ Port-A-Cath with distal tip terminating in the right  atrium. . Central airways: Similar narrowing of the bronchus intermedius and thickening of the right upper lobe bronchus without discrete mass. . Mediastinum/hila: Similar degree of mediastinal and right hilar invasion by the right upper lobe mass described below. Similar encasement of the right upper lobe pulmonary artery and partial encasement of the superior vena cava. Similar circumferential thickening of the distal esophagus. [name] Heart/vessel: Normal heart size. Aortic valve calcification. Small pericardial effusion. Heavy coronary artery calcifications. Suspected coronary artery stenting. Aorta normal in caliber. Moderate calcification of the aortic arch. No central pulmonary embolism. . Lungs/Pleura: Spiculated right upper lobe mass measures 3.6 x 2.2 cm on image 82 of series 3, unchanged. Similar radiation changes in the right upper lung. Similar moderate centrilobular and substantial paraseptal emphysema. Similar airspace consolidation and bronchiectasis in the right lower lobe. Faint, patchy groundglass opacities have developed in the left lung, also suspicious for pneumonia. Unchanged small right pleural effusion. No pneumothorax.  ABDOMEN: . Liver: Within normal limits. . Gallbladder/biliary: Similar mild intrahepatic biliary ductal dilatation. Gallbladder is unremarkable. [name] Spleen: Accessory spleen. . Pancreas: Similar high density focus in the pancreatic head near the ampulla. . Adrenals: Right adrenal mass has increased, now measuring 3.9 x 6.2 cm. This mass appears to invade the inferior vena cava on image 115 of series 2. Left adrenal is unremarkable. . Kidneys: Symmetric enhancement. No hydronephrosis. Similar small nonobstructing calculi bilaterally. Stable bilateral hypoattenuating lesions. . Peritoneum/mesentery: 1.2 cm mesenteric lymph node on image 148 of series 2 has increased in size. Right lower quadrant mesenteric nodule on image 162 of series 2 with low density center is  inseparable from bowel, but has increased in size, now measuring 2.1 x 1.3 cm. Unchanged 9 mm mesenteric lymph node on image 178 of series 2. . Extraperitoneum: Right inguinal lymph node on image 249 of series 2 measures 1.6 x 1.2 cm, increased. Prominent left inguinal lymph node is unchanged. . GI tract: No evidence of obstruction. Colonic diverticulosis without evidence of acute diverticulitis. . Vascular: Heavy atherosclerotic burden of the abdominal aorta and iliac branch vasculature.  Mural thrombus in the distal abdominal aorta. Similar ectasia of the right common iliac artery measuring up to 1.8 cm with associated mural thrombus. Dilatation of the left external iliac and common femoral veins with internal low density, consistent with mixing artifact.  PELVIS: . Ureters: Within normal limits. . Bladder: Within normal limits. . Reproductive system: Nonspecific prostatic calcifications.  MSK: . No aggressive appearing osseous lesion identified. Unchanged lucency in the medial right ilium. This was not PET avid on the PET/CT dated 11/28/2017. Multilevel degenerative changes of the thoracolumbar spine. Left S1 laminectomy.  CONCLUSION:   Unchanged spiculated right upper lobe mass which invades the right hilum and mediastinum.  Similar right lower lobe obstructive pneumonia with associated bronchiectasis. Faint groundglass opacities have developed in the left lung, consistent with pneumonia.  Unchanged small right pleural effusion.  Interval increase in size of the right adrenal mass. There is now suspected invasion of the inferior vena cava.  Interval increase in size of two mesenteric nodules in the right lower quadrant of the abdomen.  No results found for this or any previous visit (from the past 72 hour(s)). No results found.    Assessment and Plan: 72 y.o. male with   .Jedi was seen today for constipation.  Diagnoses and all orders for this visit:  Therapeutic opioid  induced constipation -     DG Abd 2 Views -     naloxegol oxalate (MOVANTIK) 25 MG TABS tablet; Take 1 tablet (25 mg total) by mouth daily after breakfast. Take 1h before or 2h after meal  Failure to thrive syndrome, adult  Abnormal weight loss  Poor appetite   Plain film abdomen to r/o obstruction. No coffee ground emesis or nausea, which is reassuring Personally reviewed CT chest/abd/pel report from 04/08/18, which did show some metastatic disease of two mesenteric nodules in the RLQ No evidence of obstruction at that time This is likely opioid-induced constipation Starting Movantik Discontinue senna Counseled on general measures for constipation  Given his weight loss and FTT symptoms, I will also have him discontinue Metformin. Instructed to monitor blood glucose at home and contact me for glucose<80 and >160  Patient education and anticipatory guidance given Patient agrees with treatment plan Follow-up in 2 months for A1c or sooner as needed if symptoms worsen or fail to improve  Darlyne Russian PA-C

## 2018-05-30 ENCOUNTER — Encounter: Payer: Self-pay | Admitting: Physician Assistant

## 2018-05-30 NOTE — Telephone Encounter (Signed)
Left a voicemail with Jolee Ewing at [facility] requesting callback. If I don't hear back from her today, can you call her tomorrow at [phone] x [phone]

## 2018-06-03 ENCOUNTER — Encounter: Payer: Self-pay | Admitting: Physician Assistant

## 2018-06-03 DIAGNOSIS — S0501XA Injury of conjunctiva and corneal abrasion without foreign body, right eye, initial encounter: Secondary | ICD-10-CM | POA: Diagnosis not present

## 2018-06-03 DIAGNOSIS — T402X5A Adverse effect of other opioids, initial encounter: Principal | ICD-10-CM

## 2018-06-03 DIAGNOSIS — H02203 Unspecified lagophthalmos right eye, unspecified eyelid: Secondary | ICD-10-CM | POA: Diagnosis not present

## 2018-06-03 DIAGNOSIS — G518 Other disorders of facial nerve: Secondary | ICD-10-CM | POA: Diagnosis not present

## 2018-06-03 DIAGNOSIS — R627 Adult failure to thrive: Secondary | ICD-10-CM | POA: Insufficient documentation

## 2018-06-03 DIAGNOSIS — K5903 Drug induced constipation: Secondary | ICD-10-CM | POA: Insufficient documentation

## 2018-06-03 NOTE — Telephone Encounter (Signed)
Spoke with Levada Dy from [facility] she stated that she do not deal with power wheelchairs and transferred me to the Power wheelchair department.  I left a message for a returned phone call. -EH/RMA

## 2018-06-10 ENCOUNTER — Encounter: Payer: Self-pay | Admitting: Internal Medicine

## 2018-06-10 ENCOUNTER — Telehealth: Payer: Self-pay | Admitting: Internal Medicine

## 2018-06-10 ENCOUNTER — Inpatient Hospital Stay: Payer: Medicare Other | Attending: Internal Medicine | Admitting: Internal Medicine

## 2018-06-10 ENCOUNTER — Encounter: Payer: Self-pay | Admitting: Physician Assistant

## 2018-06-10 ENCOUNTER — Inpatient Hospital Stay: Payer: Medicare Other

## 2018-06-10 VITALS — BP 89/61 | HR 83 | Temp 98.4°F | Resp 16 | Ht 71.0 in | Wt 141.2 lb

## 2018-06-10 DIAGNOSIS — C3491 Malignant neoplasm of unspecified part of right bronchus or lung: Secondary | ICD-10-CM

## 2018-06-10 DIAGNOSIS — Z79899 Other long term (current) drug therapy: Secondary | ICD-10-CM | POA: Diagnosis not present

## 2018-06-10 DIAGNOSIS — R634 Abnormal weight loss: Secondary | ICD-10-CM | POA: Diagnosis not present

## 2018-06-10 DIAGNOSIS — R531 Weakness: Secondary | ICD-10-CM | POA: Diagnosis not present

## 2018-06-10 DIAGNOSIS — R109 Unspecified abdominal pain: Secondary | ICD-10-CM

## 2018-06-10 DIAGNOSIS — C3411 Malignant neoplasm of upper lobe, right bronchus or lung: Secondary | ICD-10-CM | POA: Diagnosis not present

## 2018-06-10 DIAGNOSIS — C3490 Malignant neoplasm of unspecified part of unspecified bronchus or lung: Secondary | ICD-10-CM

## 2018-06-10 DIAGNOSIS — R5383 Other fatigue: Secondary | ICD-10-CM

## 2018-06-10 DIAGNOSIS — Z5112 Encounter for antineoplastic immunotherapy: Secondary | ICD-10-CM

## 2018-06-10 LAB — CMP (CANCER CENTER ONLY)
ALT: 12 U/L (ref 0–44)
AST: 15 U/L (ref 15–41)
Albumin: 2.8 g/dL — ABNORMAL LOW (ref 3.5–5.0)
Alkaline Phosphatase: 123 U/L (ref 38–126)
Anion gap: 10 (ref 5–15)
BUN: 9 mg/dL (ref 8–23)
CO2: 31 mmol/L (ref 22–32)
Calcium: 9.9 mg/dL (ref 8.9–10.3)
Chloride: 92 mmol/L — ABNORMAL LOW (ref 98–111)
Creatinine: 0.65 mg/dL (ref 0.61–1.24)
GFR, Est AFR Am: >60 mL/min
GFR, Estimated: >60 mL/min
Glucose, Bld: 137 mg/dL — ABNORMAL HIGH (ref 70–99)
Potassium: 4.5 mmol/L (ref 3.5–5.1)
Sodium: 133 mmol/L — ABNORMAL LOW (ref 135–145)
Total Bilirubin: 0.6 mg/dL (ref 0.3–1.2)
Total Protein: 7.5 g/dL (ref 6.5–8.1)

## 2018-06-10 LAB — CBC WITH DIFFERENTIAL (CANCER CENTER ONLY)
Abs Immature Granulocytes: 0.29 10*3/uL — ABNORMAL HIGH (ref 0.00–0.07)
Basophils Absolute: 0.1 10*3/uL (ref 0.0–0.1)
Basophils Relative: 0 %
Eosinophils Absolute: 0.2 10*3/uL (ref 0.0–0.5)
Eosinophils Relative: 1 %
HCT: 37.4 % — ABNORMAL LOW (ref 39.0–52.0)
Hemoglobin: 11.9 g/dL — ABNORMAL LOW (ref 13.0–17.0)
Immature Granulocytes: 1 %
LYMPHS PCT: 6 %
Lymphs Abs: 1.7 10*3/uL (ref 0.7–4.0)
MCH: 26.1 pg (ref 26.0–34.0)
MCHC: 31.8 g/dL (ref 30.0–36.0)
MCV: 82 fL (ref 80.0–100.0)
Monocytes Absolute: 2.1 10*3/uL — ABNORMAL HIGH (ref 0.1–1.0)
Monocytes Relative: 7 %
NRBC: 0 % (ref 0.0–0.2)
Neutro Abs: 24 10*3/uL — ABNORMAL HIGH (ref 1.7–7.7)
Neutrophils Relative %: 85 %
PLATELETS: 325 10*3/uL (ref 150–400)
RBC: 4.56 MIL/uL (ref 4.22–5.81)
RDW: 18.5 % — ABNORMAL HIGH (ref 11.5–15.5)
WBC Count: 28.4 10*3/uL — ABNORMAL HIGH (ref 4.0–10.5)

## 2018-06-10 LAB — TSH: TSH: 2 u[IU]/mL (ref 0.320–4.118)

## 2018-06-10 MED ORDER — MIRTAZAPINE 30 MG PO TABS: 30.0000 mg | ORAL_TABLET | Freq: Every day | ORAL | 2 refills | Status: DC

## 2018-06-10 MED ORDER — PREDNISONE 10 MG PO TABS: 5.0000 mg | ORAL_TABLET | Freq: Two times a day (BID) | ORAL | 0 refills | Status: AC

## 2018-06-10 NOTE — Progress Notes (Signed)
[facility] [phone]   [phone]  OFFICE PROGRESS NOTE  [address], [place] [address]  DIAGNOSIS: Stage IIIA/B (T4, N1-2, M0)non-small cell lung cancer, squamous cell carcinoma presented with large central right upper lobe lung mass with associated postobstructive pneumonitis in addition to right hilar and questionable right mediastinal lymphadenopathy diagnosed in November 2018.  PRIOR THERAPY:  1) A course of concurrent chemoradiation with weekly carboplatin for AUC of 2 and paclitaxel 45 mg/M2.Status post6cycles. 2) Consolidation immunotherapy with Imfinzi given every 2 weeks. First dose 07/30/2017.Status post6cycles. 3) the SGN Tisotumab Vedotin trial at [facility] discontinued secondary to disease progression. 4) status post 1 dose of single agent gemcitabine at [facility].   CURRENT THERAPY:  None.  INTERVAL HISTORY: SELSO [name] 73 y.o. male returns to the clinic today for reevaluation.  He was last seen in the clinic in May 2019.  The patient self-referred to [facility] for a second opinion regarding his progressive lung cancer at that time.  He was started on a clinical trial with Tisotumab vedotin but this was discontinued secondary to disease progression.  The patient was then started on treatment with single agent gemcitabine for 1 dose but again this was discontinued and he was advised to consider palliative care and hospice.  He was not satisfied with referral for the palliative care and hospice at [facility] and he decided to come back for follow-up visit and discuss other treatment options.  He is complaining of significant fatigue and weakness.  He lost a lot of weight since his last visit here.  His performance status has significantly declined since the last time I saw him.  He has a lot of weakness.  He denied having any chest pain or  shortness of breath except with exertion with no cough or hemoptysis.  He complains of intermittent abdominal pain.  He denied having any nausea or vomiting, diarrhea or constipation.  He has no current fever or chills.   MEDICAL HISTORY: Past Medical History:  Diagnosis Date  . 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 Repeat US due 2023  . Carotid arterial disease ([place])    a. S/p R CEA;  b. 05/2015 Carotid U/S: RICA 6-38%, LICA 75-64%.  . Coronary artery disease    a. 2012 MI in [place] w/ PCI  //  b. 04/2013 MV: inflat scar, no ischemia.  //  c.   NSTEMI 5/17 >> Promus DES to LCx and POBA to OM1 (ISR).   . Diabetes mellitus without complication ([place])   . Grade II diastolic dysfunction 33/29/5188  . History of cardiac catheterization 10/21/15   a. NSTEMI 5/17: oLAD 40, pLCx 90 (Promus DES), OM2 90 ISR (POBA), oRCA 25   . History of echocardiogram    a. Mild LVH, EF 50-55%, possible inferolateral HK, grade 2 diastolic dysfunction, trivial AI, MAC  . History of nuclear stress test 04/2013   a. Myoview 12/14: Intermediate risk, prior inferolateral MI, no ischemia, EF 45%  . Hx of leukocytosis   . Hyperlipidemia   . Hypertension   . Hypertensive heart disease   . Morbid obesity ([place])   . Tobacco abuse     ALLERGIES:  is allergic to brilinta [ticagrelor].  MEDICATIONS:  Current Outpatient Medications  Medication Sig Dispense Refill  . AMBULATORY NON FORMULARY MEDICATION Wheelchair and accessories Diagnosis Stage IV squamous cell carcinoma of right  lung - C34.91 1 each 0  . aspirin EC 81 MG tablet Take 81 mg by mouth daily.    [name] atorvastatin (LIPITOR) 40 MG tablet TAKE 1 TABLET BY MOUTH EVERY DAY 90 tablet 3  . brimonidine (ALPHAGAN) 0.2 % ophthalmic solution Place 3 drops into both eyes as needed. Use as directed    . Carboxymethylcellulose Sodium (THERATEARS) 0.25 % SOLN Apply to eye.    [name] HYDROcodone-acetaminophen (NORCO/VICODIN) 5-325 MG tablet 1/2 to 1 tablet every 6  hours as needed for pain and cough    . metoprolol succinate (TOPROL-XL) 25 MG 24 hr tablet Take 2 tablets (50 mg total) by mouth 2 (two) times daily. 120 tablet 11  . naloxegol oxalate (MOVANTIK) 25 MG TABS tablet Take 1 tablet (25 mg total) by mouth daily after breakfast. Take 1h before or 2h after meal 30 tablet 3  . Polyethyl Glycol-Propyl Glycol 0.4-0.3 % SOLN Place 1 drop into both eyes as needed for dry eyes.     No current facility-administered medications for this visit.     SURGICAL HISTORY:  Past Surgical History:  Procedure Laterality Date  . CARDIAC CATHETERIZATION    . CARDIAC CATHETERIZATION N/A 10/21/2015   Procedure: Left Heart Cath and Coronary Angiography;  Surgeon: [name], MD;  Location: [place] [facility];  Service: Cardiovascular;  Laterality: N/A;  . CARDIAC CATHETERIZATION N/A 10/21/2015   Procedure: Coronary Stent Intervention;  Surgeon: [name], MD;  Location: [place] [facility];  Service: Cardiovascular;  Laterality: N/A;  . CARDIAC CATHETERIZATION N/A 10/21/2015   Procedure: Coronary Balloon Angioplasty;  Surgeon: [name], MD;  Location: [place] [facility];  Service: Cardiovascular;  Laterality: N/A;  . CAROTID ENDARTERECTOMY    . CORONARY ANGIOPLASTY    . CORONARY STENT PLACEMENT    . VIDEO BRONCHOSCOPY Bilateral 04/03/2017   Procedure: VIDEO BRONCHOSCOPY WITH FLUORO;  Surgeon: [name], MD;  [facility];  Service: Cardiopulmonary;  Laterality: Bilateral;    REVIEW OF SYSTEMS:  Constitutional: positive for anorexia, fatigue and weight loss Eyes: negative Ears, nose, mouth, throat, and face: negative Respiratory: positive for dyspnea on exertion Cardiovascular: negative Gastrointestinal: positive for abdominal pain Genitourinary:negative Integument/breast: negative Hematologic/lymphatic: negative Musculoskeletal:positive for muscle weakness Neurological: negative Behavioral/Psych:  negative Endocrine: negative Allergic/Immunologic: negative   PHYSICAL EXAMINATION: General appearance: alert, cooperative, cachectic, fatigued, no distress and Malnourished Head: Normocephalic, without obvious abnormality, atraumatic Neck: no adenopathy, no JVD, supple, symmetrical, trachea midline and thyroid not enlarged, symmetric, no tenderness/mass/nodules Lymph nodes: Cervical, supraclavicular, and axillary nodes normal. Resp: clear to auscultation bilaterally Back: symmetric, no curvature. ROM normal. No CVA tenderness. Cardio: regular rate and rhythm, S1, S2 normal, no murmur, click, rub or gallop GI: soft, non-tender; bowel sounds normal; no masses,  no organomegaly Extremities: extremities normal, atraumatic, no cyanosis or edema Neurologic: Alert and oriented X 3, normal strength and tone. Normal symmetric reflexes. Normal coordination and gait  ECOG PERFORMANCE STATUS: 3 - Symptomatic, >50% confined to bed  Blood pressure (!) 89/61, pulse 83, temperature 98.4 F (36.9 C), temperature source Oral, resp. rate 16, height _0  (1.803 m), weight 141 lb 3.2 oz (64 kg), SpO2 100 %.  LABORATORY DATA: Lab Results  Component Value Date   WBC 28.4 (H) 06/10/2018   HGB 11.9 (L) 06/10/2018   HCT 37.4 (L) 06/10/2018   MCV 82.0 06/10/2018   PLT 325 06/10/2018      Chemistry      Component Value Date/Time  NA 133 (L) 06/10/2018 1345   NA 132 (L) 12/24/2017 1131   NA 133 (L) 05/28/2017 0932   K 4.5 06/10/2018 1345   K 4.4 05/28/2017 0932   CL 92 (L) 06/10/2018 1345   CO2 31 06/10/2018 1345   CO2 24 05/28/2017 0932   BUN 9 06/10/2018 1345   BUN 6 (L) 12/24/2017 1131   BUN 9.3 05/28/2017 0932   CREATININE 0.65 06/10/2018 1345   CREATININE 0.8 05/28/2017 0932      Component Value Date/Time   CALCIUM 9.9 06/10/2018 1345   CALCIUM 8.5 05/28/2017 0932   ALKPHOS 123 06/10/2018 1345   ALKPHOS 68 05/28/2017 0932   AST 15 06/10/2018 1345   AST 13 05/28/2017 0932   ALT 12  06/10/2018 1345   ALT 16 05/28/2017 0932   BILITOT 0.6 06/10/2018 1345   BILITOT 0.40 05/28/2017 0932       RADIOGRAPHIC STUDIES: Dg Abd 2 Views  Result Date: 05/28/2018 CLINICAL DATA:  Abdominal pain and constipation EXAM: ABDOMEN - 2 VIEW COMPARISON:  PET-CT April 16, 2017 FINDINGS: Supine and upright images obtained. There is diffuse stool throughout the colon. The rectum is distended with stool. There is no appreciable bowel dilatation or air-fluid level to suggest bowel obstruction. No free air. There is scarring in the right lung base region. There are foci of aorto bi-iliac artery atherosclerosis. Bones are osteoporotic. IMPRESSION: Diffuse stool throughout colon and rectum with distention of rectum by stool. These are findings indicative of constipation. No bowel obstruction or free air. Scarring right lung base. Aortoiliac atherosclerosis noted. Bones osteoporotic. Aortic Atherosclerosis (ICD10-I70.0). Electronically Signed   By: Lowella Grip III M.D.   On: 05/28/2018 15:23    ASSESSMENT AND PLAN: This is a very pleasant 73 years old white male recently diagnosed with a stage IIIA/B non-small cell lung cancer, squamous cell carcinoma status post concurrent chemoradiation with weekly carboplatin and paclitaxel with partial response.  He is currently undergoing treatment with consolidation immunotherapy with Imfinzi (Durvalumab) status post 6 cycles.  He has tolerated the previous treatment well but he has evidence for disease progression after cycle #6. The patient elected to have his care at [facility] since early June 2019.  He underwent a clinical trial with Tisotumab Vedotin but this was discontinued secondary to disease progression.  The patient was then tried on single agent gemcitabine for 1 dose and it was discontinued secondary to deterioration of his performance status. He was advised to consider palliative care and hospice but the patient is declining this  option.  He is interested in looking for more treatment if possible. I had a lengthy discussion with the patient and his wife today about his current condition and treatment options.  His performance status is very poor and the patient may not be a good candidate for any treatment with his current status. I recommended for the patient to work on his nutrition for the next few weeks.  I started him on prednisone 10 mg p.o. twice daily in addition to Remeron 30 mg p.o. nightly. I will also refer the patient to the dietitian at the cancer center for nutritional consultation. I will arrange for the patient to come back for follow-up visit in around 5 weeks for reevaluation with repeat CT scan of the chest, abdomen and pelvis for restaging of his disease before considering him for any additional treatment if he has improvement of his performance status. The patient voices understanding of current disease status and treatment  options and is in agreement with the current care plan.  All questions were answered. The patient knows to call the clinic with any problems, questions or concerns. We can certainly see the patient much sooner if necessary.  Disclaimer: This note was dictated with voice recognition software. Similar sounding words can inadvertently be transcribed and may not be corrected upon review.

## 2018-06-10 NOTE — Telephone Encounter (Signed)
Printed calendar and avs. °

## 2018-06-12 ENCOUNTER — Telehealth: Payer: Self-pay

## 2018-06-12 DIAGNOSIS — R5381 Other malaise: Secondary | ICD-10-CM

## 2018-06-12 DIAGNOSIS — C3491 Malignant neoplasm of unspecified part of right bronchus or lung: Secondary | ICD-10-CM

## 2018-06-12 MED ORDER — AMBULATORY NON FORMULARY MEDICATION: 0 refills | Status: AC

## 2018-06-12 NOTE — Telephone Encounter (Signed)
[facility] left vm stating that he just needs a new order faxed over for a power (eletric) wheelchair. Phone: [phone] option 1  Fax: [phone]. Please advise. -EH/RMA

## 2018-06-12 NOTE — Telephone Encounter (Signed)
Prescription signed and in [place]

## 2018-06-13 DIAGNOSIS — S0501XD Injury of conjunctiva and corneal abrasion without foreign body, right eye, subsequent encounter: Secondary | ICD-10-CM | POA: Diagnosis not present

## 2018-06-13 DIAGNOSIS — H02203 Unspecified lagophthalmos right eye, unspecified eyelid: Secondary | ICD-10-CM | POA: Diagnosis not present

## 2018-06-13 DIAGNOSIS — Z48810 Encounter for surgical aftercare following surgery on the sense organs: Secondary | ICD-10-CM | POA: Diagnosis not present

## 2018-06-13 NOTE — Telephone Encounter (Signed)
Order faxed -EH/RMA

## 2018-06-17 ENCOUNTER — Other Ambulatory Visit: Payer: Self-pay | Admitting: Internal Medicine

## 2018-06-20 DIAGNOSIS — S0501XD Injury of conjunctiva and corneal abrasion without foreign body, right eye, subsequent encounter: Secondary | ICD-10-CM | POA: Diagnosis not present

## 2018-06-20 DIAGNOSIS — H3581 Retinal edema: Secondary | ICD-10-CM | POA: Diagnosis not present

## 2018-06-20 DIAGNOSIS — H16211 Exposure keratoconjunctivitis, right eye: Secondary | ICD-10-CM | POA: Diagnosis not present

## 2018-06-20 DIAGNOSIS — H2513 Age-related nuclear cataract, bilateral: Secondary | ICD-10-CM | POA: Diagnosis not present

## 2018-06-21 ENCOUNTER — Ambulatory Visit: Payer: Medicare Other | Admitting: Physician Assistant

## 2018-06-21 ENCOUNTER — Emergency Department (HOSPITAL_COMMUNITY): Payer: Medicare Other

## 2018-06-21 ENCOUNTER — Inpatient Hospital Stay (HOSPITAL_COMMUNITY)
Admission: EM | Admit: 2018-06-21 | Discharge: 2018-07-02 | DRG: 377 | Disposition: A | Discharge status: Hospice | Payer: Medicare Other | Attending: Internal Medicine | Admitting: Internal Medicine

## 2018-06-21 ENCOUNTER — Other Ambulatory Visit: Payer: Self-pay

## 2018-06-21 DIAGNOSIS — L89151 Pressure ulcer of sacral region, stage 1: Secondary | ICD-10-CM | POA: Diagnosis present

## 2018-06-21 DIAGNOSIS — J440 Chronic obstructive pulmonary disease with acute lower respiratory infection: Secondary | ICD-10-CM | POA: Diagnosis present

## 2018-06-21 DIAGNOSIS — R64 Cachexia: Secondary | ICD-10-CM | POA: Diagnosis present

## 2018-06-21 DIAGNOSIS — E785 Hyperlipidemia, unspecified: Secondary | ICD-10-CM | POA: Diagnosis present

## 2018-06-21 DIAGNOSIS — I252 Old myocardial infarction: Secondary | ICD-10-CM

## 2018-06-21 DIAGNOSIS — D62 Acute posthemorrhagic anemia: Secondary | ICD-10-CM | POA: Diagnosis present

## 2018-06-21 DIAGNOSIS — Z7401 Bed confinement status: Secondary | ICD-10-CM | POA: Diagnosis not present

## 2018-06-21 DIAGNOSIS — Y95 Nosocomial condition: Secondary | ICD-10-CM | POA: Diagnosis not present

## 2018-06-21 DIAGNOSIS — R451 Restlessness and agitation: Secondary | ICD-10-CM

## 2018-06-21 DIAGNOSIS — I77811 Abdominal aortic ectasia: Secondary | ICD-10-CM | POA: Diagnosis present

## 2018-06-21 DIAGNOSIS — Z681 Body mass index (BMI) 19 or less, adult: Secondary | ICD-10-CM

## 2018-06-21 DIAGNOSIS — K625 Hemorrhage of anus and rectum: Secondary | ICD-10-CM | POA: Diagnosis not present

## 2018-06-21 DIAGNOSIS — C779 Secondary and unspecified malignant neoplasm of lymph node, unspecified: Secondary | ICD-10-CM | POA: Diagnosis not present

## 2018-06-21 DIAGNOSIS — E441 Mild protein-calorie malnutrition: Secondary | ICD-10-CM | POA: Diagnosis present

## 2018-06-21 DIAGNOSIS — E119 Type 2 diabetes mellitus without complications: Secondary | ICD-10-CM | POA: Diagnosis not present

## 2018-06-21 DIAGNOSIS — I4892 Unspecified atrial flutter: Secondary | ICD-10-CM | POA: Diagnosis not present

## 2018-06-21 DIAGNOSIS — Z8249 Family history of ischemic heart disease and other diseases of the circulatory system: Secondary | ICD-10-CM

## 2018-06-21 DIAGNOSIS — S90421A Blister (nonthermal), right great toe, initial encounter: Secondary | ICD-10-CM | POA: Diagnosis present

## 2018-06-21 DIAGNOSIS — X58XXXA Exposure to other specified factors, initial encounter: Secondary | ICD-10-CM | POA: Diagnosis present

## 2018-06-21 DIAGNOSIS — K5791 Diverticulosis of intestine, part unspecified, without perforation or abscess with bleeding: Secondary | ICD-10-CM | POA: Diagnosis not present

## 2018-06-21 DIAGNOSIS — Z87891 Personal history of nicotine dependence: Secondary | ICD-10-CM | POA: Diagnosis not present

## 2018-06-21 DIAGNOSIS — E7800 Pure hypercholesterolemia, unspecified: Secondary | ICD-10-CM | POA: Diagnosis not present

## 2018-06-21 DIAGNOSIS — L810 Postinflammatory hyperpigmentation: Secondary | ICD-10-CM | POA: Diagnosis not present

## 2018-06-21 DIAGNOSIS — D649 Anemia, unspecified: Secondary | ICD-10-CM

## 2018-06-21 DIAGNOSIS — Z66 Do not resuscitate: Secondary | ICD-10-CM | POA: Diagnosis present

## 2018-06-21 DIAGNOSIS — Z79891 Long term (current) use of opiate analgesic: Secondary | ICD-10-CM

## 2018-06-21 DIAGNOSIS — L8990 Pressure ulcer of unspecified site, unspecified stage: Secondary | ICD-10-CM

## 2018-06-21 DIAGNOSIS — C786 Secondary malignant neoplasm of retroperitoneum and peritoneum: Secondary | ICD-10-CM | POA: Diagnosis not present

## 2018-06-21 DIAGNOSIS — E871 Hypo-osmolality and hyponatremia: Secondary | ICD-10-CM | POA: Diagnosis not present

## 2018-06-21 DIAGNOSIS — E782 Mixed hyperlipidemia: Secondary | ICD-10-CM | POA: Diagnosis not present

## 2018-06-21 DIAGNOSIS — Z7189 Other specified counseling: Secondary | ICD-10-CM | POA: Diagnosis not present

## 2018-06-21 DIAGNOSIS — C221 Intrahepatic bile duct carcinoma: Secondary | ICD-10-CM | POA: Diagnosis not present

## 2018-06-21 DIAGNOSIS — R197 Diarrhea, unspecified: Secondary | ICD-10-CM | POA: Diagnosis not present

## 2018-06-21 DIAGNOSIS — K5730 Diverticulosis of large intestine without perforation or abscess without bleeding: Secondary | ICD-10-CM | POA: Diagnosis not present

## 2018-06-21 DIAGNOSIS — K5731 Diverticulosis of large intestine without perforation or abscess with bleeding: Secondary | ICD-10-CM | POA: Diagnosis not present

## 2018-06-21 DIAGNOSIS — C7971 Secondary malignant neoplasm of right adrenal gland: Secondary | ICD-10-CM | POA: Diagnosis not present

## 2018-06-21 DIAGNOSIS — I2510 Atherosclerotic heart disease of native coronary artery without angina pectoris: Secondary | ICD-10-CM | POA: Diagnosis not present

## 2018-06-21 DIAGNOSIS — M79672 Pain in left foot: Secondary | ICD-10-CM | POA: Diagnosis not present

## 2018-06-21 DIAGNOSIS — K5903 Drug induced constipation: Secondary | ICD-10-CM | POA: Diagnosis not present

## 2018-06-21 DIAGNOSIS — M79671 Pain in right foot: Secondary | ICD-10-CM | POA: Diagnosis not present

## 2018-06-21 DIAGNOSIS — I5030 Unspecified diastolic (congestive) heart failure: Secondary | ICD-10-CM | POA: Diagnosis present

## 2018-06-21 DIAGNOSIS — D500 Iron deficiency anemia secondary to blood loss (chronic): Secondary | ICD-10-CM | POA: Diagnosis not present

## 2018-06-21 DIAGNOSIS — T402X5A Adverse effect of other opioids, initial encounter: Secondary | ICD-10-CM

## 2018-06-21 DIAGNOSIS — I110 Hypertensive heart disease with heart failure: Secondary | ICD-10-CM | POA: Diagnosis present

## 2018-06-21 DIAGNOSIS — J410 Simple chronic bronchitis: Secondary | ICD-10-CM | POA: Diagnosis not present

## 2018-06-21 DIAGNOSIS — D72828 Other elevated white blood cell count: Secondary | ICD-10-CM | POA: Diagnosis not present

## 2018-06-21 DIAGNOSIS — B351 Tinea unguium: Secondary | ICD-10-CM | POA: Diagnosis present

## 2018-06-21 DIAGNOSIS — K922 Gastrointestinal hemorrhage, unspecified: Secondary | ICD-10-CM | POA: Diagnosis not present

## 2018-06-21 DIAGNOSIS — I4589 Other specified conduction disorders: Secondary | ICD-10-CM | POA: Diagnosis not present

## 2018-06-21 DIAGNOSIS — R918 Other nonspecific abnormal finding of lung field: Secondary | ICD-10-CM | POA: Diagnosis not present

## 2018-06-21 DIAGNOSIS — J189 Pneumonia, unspecified organism: Secondary | ICD-10-CM | POA: Clinically undetermined

## 2018-06-21 DIAGNOSIS — R230 Cyanosis: Secondary | ICD-10-CM | POA: Diagnosis present

## 2018-06-21 DIAGNOSIS — C3491 Malignant neoplasm of unspecified part of right bronchus or lung: Secondary | ICD-10-CM | POA: Diagnosis not present

## 2018-06-21 DIAGNOSIS — Z515 Encounter for palliative care: Secondary | ICD-10-CM | POA: Diagnosis not present

## 2018-06-21 DIAGNOSIS — I700 Atherosclerosis of aorta: Secondary | ICD-10-CM | POA: Diagnosis present

## 2018-06-21 DIAGNOSIS — R58 Hemorrhage, not elsewhere classified: Secondary | ICD-10-CM | POA: Diagnosis not present

## 2018-06-21 DIAGNOSIS — R0602 Shortness of breath: Secondary | ICD-10-CM | POA: Diagnosis not present

## 2018-06-21 DIAGNOSIS — Z9221 Personal history of antineoplastic chemotherapy: Secondary | ICD-10-CM

## 2018-06-21 DIAGNOSIS — Z7982 Long term (current) use of aspirin: Secondary | ICD-10-CM

## 2018-06-21 DIAGNOSIS — Z436 Encounter for attention to other artificial openings of urinary tract: Secondary | ICD-10-CM | POA: Diagnosis not present

## 2018-06-21 DIAGNOSIS — D72829 Elevated white blood cell count, unspecified: Secondary | ICD-10-CM | POA: Diagnosis not present

## 2018-06-21 DIAGNOSIS — C3411 Malignant neoplasm of upper lobe, right bronchus or lung: Secondary | ICD-10-CM | POA: Diagnosis present

## 2018-06-21 DIAGNOSIS — Z955 Presence of coronary angioplasty implant and graft: Secondary | ICD-10-CM

## 2018-06-21 DIAGNOSIS — R0902 Hypoxemia: Secondary | ICD-10-CM | POA: Diagnosis not present

## 2018-06-21 DIAGNOSIS — K254 Chronic or unspecified gastric ulcer with hemorrhage: Secondary | ICD-10-CM | POA: Diagnosis not present

## 2018-06-21 DIAGNOSIS — I483 Typical atrial flutter: Secondary | ICD-10-CM | POA: Diagnosis not present

## 2018-06-21 DIAGNOSIS — R531 Weakness: Secondary | ICD-10-CM | POA: Diagnosis not present

## 2018-06-21 DIAGNOSIS — I951 Orthostatic hypotension: Secondary | ICD-10-CM | POA: Diagnosis not present

## 2018-06-21 DIAGNOSIS — M2550 Pain in unspecified joint: Secondary | ICD-10-CM | POA: Diagnosis not present

## 2018-06-21 DIAGNOSIS — J411 Mucopurulent chronic bronchitis: Secondary | ICD-10-CM | POA: Diagnosis not present

## 2018-06-21 DIAGNOSIS — E43 Unspecified severe protein-calorie malnutrition: Secondary | ICD-10-CM | POA: Diagnosis present

## 2018-06-21 DIAGNOSIS — I959 Hypotension, unspecified: Secondary | ICD-10-CM | POA: Clinically undetermined

## 2018-06-21 DIAGNOSIS — K921 Melena: Secondary | ICD-10-CM

## 2018-06-21 DIAGNOSIS — A419 Sepsis, unspecified organism: Secondary | ICD-10-CM | POA: Diagnosis not present

## 2018-06-21 DIAGNOSIS — T402X5D Adverse effect of other opioids, subsequent encounter: Secondary | ICD-10-CM

## 2018-06-21 DIAGNOSIS — E222 Syndrome of inappropriate secretion of antidiuretic hormone: Secondary | ICD-10-CM | POA: Diagnosis present

## 2018-06-21 DIAGNOSIS — I519 Heart disease, unspecified: Secondary | ICD-10-CM | POA: Diagnosis present

## 2018-06-21 DIAGNOSIS — J449 Chronic obstructive pulmonary disease, unspecified: Secondary | ICD-10-CM | POA: Diagnosis present

## 2018-06-21 DIAGNOSIS — L89301 Pressure ulcer of unspecified buttock, stage 1: Secondary | ICD-10-CM | POA: Diagnosis not present

## 2018-06-21 DIAGNOSIS — Z79899 Other long term (current) drug therapy: Secondary | ICD-10-CM

## 2018-06-21 DIAGNOSIS — Z888 Allergy status to other drugs, medicaments and biological substances status: Secondary | ICD-10-CM

## 2018-06-21 DIAGNOSIS — Z7902 Long term (current) use of antithrombotics/antiplatelets: Secondary | ICD-10-CM

## 2018-06-21 DIAGNOSIS — I5189 Other ill-defined heart diseases: Secondary | ICD-10-CM | POA: Diagnosis present

## 2018-06-21 DIAGNOSIS — N5089 Other specified disorders of the male genital organs: Secondary | ICD-10-CM | POA: Diagnosis not present

## 2018-06-21 DIAGNOSIS — Z452 Encounter for adjustment and management of vascular access device: Secondary | ICD-10-CM | POA: Diagnosis not present

## 2018-06-21 LAB — CBC WITH DIFFERENTIAL/PLATELET
Abs Immature Granulocytes: 1.75 10*3/uL — ABNORMAL HIGH (ref 0.00–0.07)
Basophils Absolute: 0.1 10*3/uL (ref 0.0–0.1)
Basophils Relative: 0 %
EOS PCT: 0 %
Eosinophils Absolute: 0 10*3/uL (ref 0.0–0.5)
HCT: 25.2 % — ABNORMAL LOW (ref 39.0–52.0)
Hemoglobin: 7.9 g/dL — ABNORMAL LOW (ref 13.0–17.0)
Immature Granulocytes: 4 %
Lymphocytes Relative: 4 %
Lymphs Abs: 1.6 10*3/uL (ref 0.7–4.0)
MCH: 26.8 pg (ref 26.0–34.0)
MCHC: 31.3 g/dL (ref 30.0–36.0)
MCV: 85.4 fL (ref 80.0–100.0)
Monocytes Absolute: 2.1 10*3/uL — ABNORMAL HIGH (ref 0.1–1.0)
Monocytes Relative: 5 %
NRBC: 0 % (ref 0.0–0.2)
Neutro Abs: 38.8 10*3/uL — ABNORMAL HIGH (ref 1.7–7.7)
Neutrophils Relative %: 87 %
Platelets: 233 10*3/uL (ref 150–400)
RBC: 2.95 MIL/uL — ABNORMAL LOW (ref 4.22–5.81)
RDW: 18.3 % — ABNORMAL HIGH (ref 11.5–15.5)
WBC: 44.4 10*3/uL — ABNORMAL HIGH (ref 4.0–10.5)

## 2018-06-21 LAB — COMPREHENSIVE METABOLIC PANEL
ALT: 15 U/L (ref 0–44)
ANION GAP: 10 (ref 5–15)
AST: 20 U/L (ref 15–41)
Albumin: 3 g/dL — ABNORMAL LOW (ref 3.5–5.0)
Alkaline Phosphatase: 91 U/L (ref 38–126)
BUN: 41 mg/dL — ABNORMAL HIGH (ref 8–23)
CALCIUM: 9.7 mg/dL (ref 8.9–10.3)
CO2: 26 mmol/L (ref 22–32)
Chloride: 89 mmol/L — ABNORMAL LOW (ref 98–111)
Creatinine, Ser: 0.77 mg/dL (ref 0.61–1.24)
GFR calc Af Amer: >60 mL/min (ref >60)
GFR calc non Af Amer: >60 mL/min (ref >60)
GLUCOSE: 120 mg/dL — AB (ref 70–99)
Potassium: 4.7 mmol/L (ref 3.5–5.1)
Sodium: 125 mmol/L — ABNORMAL LOW (ref 135–145)
TOTAL PROTEIN: 6.4 g/dL — AB (ref 6.5–8.1)
Total Bilirubin: 1.2 mg/dL (ref 0.3–1.2)

## 2018-06-21 LAB — URINALYSIS, ROUTINE W REFLEX MICROSCOPIC
Bacteria, UA: NONE SEEN
Bilirubin Urine: NEGATIVE
Glucose, UA: NEGATIVE mg/dL
Ketones, ur: NEGATIVE mg/dL
Leukocytes, UA: NEGATIVE
Nitrite: NEGATIVE
Protein, ur: 30 mg/dL — AB
RBC / HPF: >50 RBC/hpf — ABNORMAL HIGH (ref 0–5)
SPECIFIC GRAVITY, URINE: 1.018 (ref 1.005–1.030)
pH: 5 (ref 5.0–8.0)

## 2018-06-21 LAB — PREPARE RBC (CROSSMATCH)

## 2018-06-21 LAB — PROTIME-INR
INR: 1.14
INR: 1.14
PROTHROMBIN TIME: 14.5 s (ref 11.4–15.2)
Prothrombin Time: 14.5 seconds (ref 11.4–15.2)

## 2018-06-21 LAB — HEMOGLOBIN AND HEMATOCRIT, BLOOD
HCT: 28.1 % — ABNORMAL LOW (ref 39.0–52.0)
HCT: 25.6 % — ABNORMAL LOW (ref 39.0–52.0)
HEMOGLOBIN: 9 g/dL — AB (ref 13.0–17.0)
Hemoglobin: 8.3 g/dL — ABNORMAL LOW (ref 13.0–17.0)

## 2018-06-21 LAB — ABO/RH: ABO/RH(D): A POS

## 2018-06-21 LAB — LIPASE, BLOOD: Lipase: 27 U/L (ref 11–51)

## 2018-06-21 MED ORDER — TRAZODONE HCL 50 MG PO TABS
50.0000 mg | ORAL_TABLET | Freq: Every evening | ORAL | Status: DC | PRN
  Administered 2018-06-22 – 2018-06-27 (×2): 50 mg via ORAL
  Filled 2018-06-21 (×2): qty 1

## 2018-06-21 MED ORDER — ACETAMINOPHEN 325 MG PO TABS
650.0000 mg | ORAL_TABLET | Freq: Four times a day (QID) | ORAL | Status: DC | PRN
  Administered 2018-06-23 – 2018-06-25 (×2): 650 mg via ORAL
  Filled 2018-06-21 (×2): qty 2

## 2018-06-21 MED ORDER — PANTOPRAZOLE SODIUM 40 MG IV SOLR
40.0000 mg | Freq: Once | INTRAVENOUS | Status: AC
  Administered 2018-06-21: 40 mg via INTRAVENOUS
  Filled 2018-06-21: qty 40

## 2018-06-21 MED ORDER — POLYETHYLENE GLYCOL 3350 17 G PO PACK
17.0000 g | PACK | Freq: Two times a day (BID) | ORAL | Status: DC
  Administered 2018-06-22 – 2018-06-27 (×11): 17 g via ORAL
  Filled 2018-06-21 (×12): qty 1

## 2018-06-21 MED ORDER — HYDROCODONE-ACETAMINOPHEN 5-325 MG PO TABS
1.0000 | ORAL_TABLET | Freq: Four times a day (QID) | ORAL | Status: DC | PRN
  Administered 2018-06-21 – 2018-06-24 (×4): 1 via ORAL
  Filled 2018-06-21 (×4): qty 1

## 2018-06-21 MED ORDER — ATORVASTATIN CALCIUM 40 MG PO TABS
40.0000 mg | ORAL_TABLET | Freq: Every day | ORAL | Status: DC
  Administered 2018-06-21 – 2018-07-02 (×12): 40 mg via ORAL
  Filled 2018-06-21 (×12): qty 1

## 2018-06-21 MED ORDER — HYDROCORTISONE ACETATE 25 MG RE SUPP
25.0000 mg | Freq: Two times a day (BID) | RECTAL | Status: DC
  Administered 2018-06-21 – 2018-07-02 (×21): 25 mg via RECTAL
  Filled 2018-06-21 (×24): qty 1

## 2018-06-21 MED ORDER — ACETAMINOPHEN 650 MG RE SUPP: 650.0000 mg | Freq: Four times a day (QID) | RECTAL | Status: DC | PRN

## 2018-06-21 MED ORDER — HYDROCOD POLST-CPM POLST ER 10-8 MG/5ML PO SUER: 5.0000 mL | Freq: Every evening | ORAL | Status: DC | PRN

## 2018-06-21 MED ORDER — POLYVINYL ALCOHOL 1.4 % OP SOLN
1.0000 [drp] | Freq: Four times a day (QID) | OPHTHALMIC | Status: DC
  Administered 2018-06-21 – 2018-07-02 (×38): 1 [drp] via OPHTHALMIC
  Filled 2018-06-21 (×2): qty 15

## 2018-06-21 MED ORDER — SODIUM CHLORIDE 0.9 % IV SOLN
INTRAVENOUS | Status: DC
  Administered 2018-06-21 – 2018-06-23 (×3): via INTRAVENOUS

## 2018-06-21 MED ORDER — ONDANSETRON HCL 4 MG PO TABS: 4.0000 mg | ORAL_TABLET | Freq: Four times a day (QID) | ORAL | Status: DC | PRN

## 2018-06-21 MED ORDER — ONDANSETRON HCL 4 MG/2ML IJ SOLN
4.0000 mg | Freq: Four times a day (QID) | INTRAMUSCULAR | Status: DC | PRN
  Administered 2018-06-30: 4 mg via INTRAVENOUS
  Filled 2018-06-21: qty 2

## 2018-06-21 MED ORDER — CHLORHEXIDINE GLUCONATE CLOTH 2 % EX PADS
6.0000 | MEDICATED_PAD | Freq: Every day | CUTANEOUS | Status: DC
  Administered 2018-06-21 – 2018-07-02 (×11): 6 via TOPICAL

## 2018-06-21 MED ORDER — NALOXEGOL OXALATE 25 MG PO TABS
25.0000 mg | ORAL_TABLET | Freq: Every day | ORAL | Status: DC
  Administered 2018-06-21 – 2018-07-02 (×11): 25 mg via ORAL
  Filled 2018-06-21 (×12): qty 1

## 2018-06-21 MED ORDER — SODIUM CHLORIDE 0.9 % IV BOLUS: 1000.0000 mL | Freq: Once | INTRAVENOUS | Status: DC

## 2018-06-21 MED ORDER — SODIUM CHLORIDE 0.9% IV SOLUTION
Freq: Once | INTRAVENOUS | Status: AC
  Administered 2018-06-21: 14:00:00 via INTRAVENOUS

## 2018-06-21 MED ORDER — SODIUM CHLORIDE 0.9% FLUSH
10.0000 mL | INTRAVENOUS | Status: DC | PRN
  Administered 2018-07-02: 10 mL
  Filled 2018-06-21: qty 40

## 2018-06-21 MED ORDER — ORAL CARE MOUTH RINSE: 15.0000 mL | Freq: Two times a day (BID) | OROMUCOSAL | Status: DC

## 2018-06-21 MED ORDER — PANTOPRAZOLE SODIUM 40 MG IV SOLR
40.0000 mg | INTRAVENOUS | Status: DC
  Administered 2018-06-22 – 2018-06-27 (×6): 40 mg via INTRAVENOUS
  Filled 2018-06-21 (×6): qty 40

## 2018-06-21 MED ORDER — PREDNISONE 5 MG PO TABS
5.0000 mg | ORAL_TABLET | Freq: Two times a day (BID) | ORAL | Status: DC
  Administered 2018-06-22 – 2018-07-02 (×21): 5 mg via ORAL
  Filled 2018-06-21 (×21): qty 1

## 2018-06-21 MED ORDER — SODIUM CHLORIDE 0.9% FLUSH
10.0000 mL | Freq: Two times a day (BID) | INTRAVENOUS | Status: DC
  Administered 2018-06-21 – 2018-06-24 (×6): 10 mL
  Administered 2018-06-25: 3 mL
  Administered 2018-06-25 – 2018-07-02 (×9): 10 mL

## 2018-06-21 MED ORDER — GATIFLOXACIN 0.5 % OP SOLN
1.0000 [drp] | Freq: Four times a day (QID) | OPHTHALMIC | Status: DC
  Administered 2018-06-21 – 2018-07-02 (×38): 1 [drp] via OPHTHALMIC
  Filled 2018-06-21: qty 2.5

## 2018-06-21 MED ORDER — ERYTHROMYCIN 5 MG/GM OP OINT
1.0000 "application " | TOPICAL_OINTMENT | Freq: Every day | OPHTHALMIC | Status: DC
  Administered 2018-06-21 – 2018-07-01 (×11): 1 via OPHTHALMIC
  Filled 2018-06-21: qty 1

## 2018-06-21 MED ORDER — SODIUM CHLORIDE 0.9 % IV SOLN: INTRAVENOUS | Status: DC

## 2018-06-21 NOTE — ED Notes (Signed)
Blood bank called and said blood was ready for this pt, informed Sonja,RN.

## 2018-06-21 NOTE — ED Notes (Signed)
Report called to receiving RN.

## 2018-06-21 NOTE — ED Triage Notes (Signed)
Per EMS, history of lung cancer-has not been getting chemo due to extreme weight loss-patient complaining of SOB-possible GI bleed-states had a SBO last week which patient states "he passed"-states "very large ball of stool"-has been taking stool softeners regularly

## 2018-06-21 NOTE — ED Provider Notes (Signed)
[facility] DEPT Provider Note   CSN: [id number] Arrival date & time: 06/21/18  0947     History   Chief Complaint No chief complaint on file.   HPI Randy [name] is a 73 y.o. male.  Patient with history of stage IV lung cancer, recently finished treatment of chemotherapy at [facility], history of diabetes --presents to the emergency department with complaint of weakness.  Patient states that he feels very weak and was not able to walk this morning.  This prompted call to EMS.  He reports ongoing shortness of breath and abdominal pain that is worse with eating.  He denies any fevers, URI symptoms, chest pain, vomiting.  No treatments prior to arrival.     Past Medical History:  Diagnosis Date  . 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 Repeat US due 2023  . Carotid arterial disease ([place])    a. S/p R CEA;  b. 05/2015 Carotid U/S: RICA 0-96%, LICA 28-36%.  . Coronary artery disease    a. 2012 MI in [place] w/ PCI  //  b. 04/2013 MV: inflat scar, no ischemia.  //  c.   NSTEMI 5/17 >> Promus DES to LCx and POBA to OM1 (ISR).   . Diabetes mellitus without complication ([place])   . Grade II diastolic dysfunction 62/94/7654  . History of cardiac catheterization 10/21/15   a. NSTEMI 5/17: oLAD 40, pLCx 90 (Promus DES), OM2 90 ISR (POBA), oRCA 25   . History of echocardiogram    a. Mild LVH, EF 50-55%, possible inferolateral HK, grade 2 diastolic dysfunction, trivial AI, MAC  . History of nuclear stress test 04/2013   a. Myoview 12/14: Intermediate risk, prior inferolateral MI, no ischemia, EF 45%  . Hx of leukocytosis   . Hyperlipidemia   . Hypertension   . Hypertensive heart disease   . Morbid obesity ([place])   . Tobacco abuse     Patient Active Problem List   Diagnosis Date Noted  . Therapeutic opioid induced constipation 06/03/2018  . Failure to thrive syndrome, adult 06/03/2018  . Dyspepsia 04/04/2018  . 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 Abnormal weight loss 04/04/2018  . 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 Palliative care status 11/12/2017  . BMI 28.0-28.9,adult 09/27/2017  . Nodule of right external ear 09/26/2017  . Mild protein-calorie malnutrition ([place]) 09/26/2017  . Malignant neoplasm of bronchus of right upper lobe ([place]) 04/24/2017  . Abdominal aortic ectasia ([place]) 03/23/2017  . Mass of upper lobe of right lung 03/14/2017  . Aortic atherosclerosis ([place]) 03/14/2017  . Chronic obstructive pulmonary disease ([place]) 03/13/2017  . Grade II diastolic dysfunction 65/07/5463  . Hypertension goal BP (blood pressure) < 140/90 03/12/2017  . Chronic cough 03/12/2017  . Diabetes mellitus without complication ([place]) 68/04/7516  . Former cigarette smoker 03/12/2017  . NSTEMI (non-ST elevated myocardial infarction) ([place]) 10/20/2015  . Carotid arterial disease ([place])   . Hyperlipidemia   . Coronary artery disease   . Leukocytosis   . Hypertensive heart disease without heart failure     Past Surgical History:  Procedure Laterality Date  . CARDIAC CATHETERIZATION    . CARDIAC CATHETERIZATION N/A 10/21/2015   Procedure: Left Heart Cath and Coronary Angiography;  Surgeon: [name], MD;  Location: [facility];  Service: Cardiovascular;  Laterality: N/A;  . CARDIAC CATHETERIZATION N/A 10/21/2015   Procedure: Coronary Stent Intervention;  Surgeon: [name],  MD;  Location: [facility];  Service: Cardiovascular;  Laterality: N/A;  . CARDIAC CATHETERIZATION N/A 10/21/2015   Procedure: Coronary Balloon Angioplasty;  Surgeon: [name], MD;  Location: [facility];  Service: Cardiovascular;  Laterality: N/A;  . CAROTID ENDARTERECTOMY    . CORONARY ANGIOPLASTY    . CORONARY STENT PLACEMENT    . VIDEO BRONCHOSCOPY Bilateral 04/03/2017   Procedure: VIDEO BRONCHOSCOPY WITH FLUORO;  Surgeon: [name], MD;  Location: [facility]  ENDOSCOPY;  Service: Cardiopulmonary;  Laterality: Bilateral;        Home Medications    Prior to Admission medications   Medication Sig Start Date End Date Taking? Authorizing Provider  AMBULATORY NON FORMULARY MEDICATION Power Wheelchair and accessories Diagnoses Stage IV squamous cell carcinoma of right lung - C34.91, Debility R53.81 06/12/18   [name], PA-C  aspirin EC 81 MG tablet Take 81 mg by mouth daily.    [provider]  atorvastatin (LIPITOR) 40 MG tablet TAKE 1 TABLET BY MOUTH EVERY DAY 06/17/18   Fay Records, MD  brimonidine [facility]) 0.2 % ophthalmic solution Place 3 drops into both eyes as needed. Use as directed 12/04/17   [provider]  Carboxymethylcellulose Sodium (THERATEARS) 0.25 % SOLN Apply to eye.    [provider]  HYDROcodone-acetaminophen (NORCO/VICODIN) 5-325 MG tablet 1/2 to 1 tablet every 6 hours as needed for pain and cough 05/20/18   [provider]  metoprolol succinate (TOPROL-XL) 25 MG 24 hr tablet Take 2 tablets (50 mg total) by mouth 2 (two) times daily. 05/06/18   Fay Records, MD  mirtazapine (REMERON) 30 MG tablet Take 1 tablet (30 mg total) by mouth at bedtime. 06/10/18   Curt Bears, MD  naloxegol oxalate (MOVANTIK) 25 MG TABS tablet Take 1 tablet (25 mg total) by mouth daily after breakfast. Take 1h before or 2h after meal 05/28/18   [name], PA-C  Polyethyl Glycol-Propyl Glycol 0.4-0.3 % SOLN Place 1 drop into both eyes as needed for dry eyes. 12/04/17   [provider]  predniSONE (DELTASONE) 10 MG tablet Take 0.5 tablets (5 mg total) by mouth 2 (two) times daily with a meal. 06/10/18   Curt Bears, MD    Family History Family History  Problem Relation Age of Onset  . Other Mother        died @ 10 - complications following surgery.  . Hypertension Father        died in [place] @ 86    Social History Social History   Tobacco Use  . Smoking status:  Former Smoker    Packs/day: 0.75    Years: 40.00    Pack years: 30.00    Types: Cigarettes    Start date: 06/18/1972    Last attempt to quit: 10/24/2015    Years since quitting: 2.6  . Smokeless tobacco: Never Used  Substance Use Topics  . Alcohol use: No    Alcohol/week: 0.0 standard drinks  . Drug use: No     Allergies   Brilinta [ticagrelor]   Review of Systems Review of Systems  Constitutional: Negative for fever.  HENT: Negative for rhinorrhea and sore throat.   Eyes: Negative for redness.  Respiratory: Positive for shortness of breath. Negative for cough.   Cardiovascular: Negative for chest pain.  Gastrointestinal: Positive for abdominal pain. Negative for diarrhea, nausea and vomiting.  Genitourinary: Negative for dysuria.  Musculoskeletal: Negative for myalgias.  Skin: Negative for rash.  Neurological: Positive for weakness. Negative for headaches.     Physical Exam Updated Vital Signs BP 97/71 (BP Location: Right Arm)   Pulse 91   Temp (!) 96.8 F (36 C) (Rectal) Comment: heat therapy applied  SpO2 100%   Physical Exam Vitals signs and nursing note reviewed.  Constitutional:      Appearance: He is well-developed.  HENT:     Head: Normocephalic and atraumatic.  Eyes:     General:        Right eye: No discharge.        Left eye: No discharge.     Conjunctiva/sclera: Conjunctivae normal.  Neck:     Musculoskeletal: Normal range of motion and neck supple.  Cardiovascular:     Rate and Rhythm: Normal rate and regular rhythm.     Heart sounds: Normal heart sounds.  Pulmonary:     Effort: Pulmonary effort is normal.     Breath sounds: Normal breath sounds.  Abdominal:     Palpations: Abdomen is soft.     Tenderness: There is no abdominal tenderness. There is no guarding or rebound.  Genitourinary:    Comments: Gross hematochezia on exam. Skin:    General: Skin is warm and dry.     Coloration: Skin is pale.  Neurological:     General: No focal  deficit present.     Mental Status: He is alert and oriented to person, place, and time.      ED Treatments / Results  Labs (all labs ordered are listed, but only abnormal results are displayed) Labs Reviewed  CBC WITH DIFFERENTIAL/PLATELET - Abnormal; Notable for the following components:      Result Value   WBC 44.4 (*)    RBC 2.95 (*)    Hemoglobin 7.9 (*)    HCT 25.2 (*)    RDW 18.3 (*)    Neutro Abs 38.8 (*)    Monocytes Absolute 2.1 (*)    Abs Immature Granulocytes 1.75 (*)    All other components within normal limits  COMPREHENSIVE METABOLIC PANEL - Abnormal; Notable for the following components:   Sodium 125 (*)    Chloride 89 (*)    Glucose, Bld 120 (*)    BUN 41 (*)    Total Protein 6.4 (*)    Albumin 3.0 (*)    All other components within normal limits  CULTURE, BLOOD (ROUTINE X 2)  CULTURE, BLOOD (ROUTINE X 2)  LIPASE, BLOOD  URINALYSIS, ROUTINE W REFLEX MICROSCOPIC  PROTIME-INR  POC OCCULT BLOOD, ED  TYPE AND SCREEN  PREPARE RBC (CROSSMATCH)  ABO/RH    EKG None  Radiology Dg Chest 2 View  Result Date: 06/21/2018 CLINICAL DATA:  History of lung cancer.  Weight loss. EXAM: CHEST - 2 VIEW COMPARISON:  March 12, 2017 FINDINGS: The heart size is normal. Right central venous line is identified in the superior vena cava. There is right perihilar and hilar mass progressed compared to prior chest x-ray of 2018 consistent with patient's known lung cancer. The left lung is clear. Nipple shadow is identified in the left lung base. The visualized skeletal structures are stable. IMPRESSION: Right perihilar and hilar mass progressed compared to prior chest x-ray of 2018 consistent with patient's known lung cancer. No focal pneumonia is noted. Electronically Signed   By: Abelardo Diesel M.D.   On: 06/21/2018 10:27    Procedures Procedures (including critical care time)  Medications Ordered in ED Medications - No data to display   Initial  Impression / Assessment  and Plan / ED Course  I have reviewed the triage vital signs and the nursing notes.  Pertinent labs & imaging results that were available during my care of the patient were reviewed by me and considered in my medical decision making (see chart for details).     Patient seen and examined. Work-up initiated.  Discussed with Dr. Ronnald Nian who will see.   Vital signs reviewed and are as follows: BP 100/73   Pulse 90   Temp (!) 96.8 F (36 C) (Rectal) Comment: heat therapy applied  Resp (!) 24   SpO2 100%   Patient with gross hematochezia on exam.  Type and screen ordered.  Awaiting hemoglobin.  1:12 PM patient's hemoglobin has dropped 4 points in the past 11 days.  Of note, 11 days ago white blood cell count was 28.4.  Today it is 44.4.  For this reason, cultures ordered.  Will defer antibiotic decision to inpatient team.  No obvious source of infection on exam today.  Patient discussed with and seen by Dr. Ronnald Nian.  Blood ordered.  Will ask GI for consult, admit to hospitalist service.  Patient and family updated.  Patient confirms that he would not want any heroic measures such as life support or CPR.  1:17 PM Spoke with Dr. Olevia Bowens who will see patient.    CRITICAL CARE Performed by: Carlisle Cater PA-C Total critical care time: 35 minutes Critical care time was exclusive of separately billable procedures and treating other patients. Critical care was necessary to treat or prevent imminent or life-threatening deterioration. Critical care was time spent personally by me on the following activities: development of treatment plan with patient and/or surrogate as well as nursing, discussions with consultants, evaluation of patient's response to treatment, examination of patient, obtaining history from patient or surrogate, ordering and performing treatments and interventions, ordering and review of laboratory studies, ordering and review of radiographic studies, pulse oximetry and  re-evaluation of patient's condition.  2:09 PM Spoke with Janett Billow with [facility] and requested consult.    Final Clinical Impressions(s) / ED Diagnoses   Final diagnoses:  Gastrointestinal hemorrhage, unspecified gastrointestinal hemorrhage type  Leukocytosis, unspecified type   Admit.   ED Discharge Orders    None       Carlisle Cater, PA-C 06/21/18 [place], [place], DO 06/21/18 1435

## 2018-06-21 NOTE — H&P (Addendum)
History and Physical    Randy [name] YIR:[id number] DOB: [birth year] DOA: 06/21/2018  PCP: [name], PA-C   Patient coming from: Home.  I have personally briefly reviewed patient's old medical records in [place]  Chief Complaint: Constipation and rectal bleed.  HPI: Randy [name] is a 73 y.o. male with medical history significant of abdominal aortic ataxia, coronary artery disease, coronary artery disease, history of NSTEMI in May 5009, grade 2 diastolic dysfunction, LVH, type 2 diabetes, hyperlipidemia, hypertension, history of leukocytosis, tobacco abuse, morbid obesity, chronic constipation, history of stage IV non-small lung cancer who is coming to the emergency department with complaints of rectal bleeding after having difficult bowel movements with melena and hematochezia secondary to constipation associated with mild abdominal pain, fatigue and lightheadedness.  He denies dysuria, frequency or hematuria.  Per patient, he has severe constipation well until recently.  The constipation resolved after he took MiraLAX for 3 days.  He also takes daily Movantik.  On review of system, patient also states he has occasional productive cough of thick clear or lighter yellowish sputum.  He denies headache, fever, chills, sore throat, chest pain, palpitations, PND or orthopnea.  He has had occasional lower extremity edema.  No polyuria, polydipsia, polyphagia or blurred vision.  ED Course: Initial vital signs temperature 96.4 F, pulse 91, respirations 28, blood pressure 97/71 mmHg and O2 sat 100% on nasal cannula oxygen.  The patient was given Protonix 40 mg IVP in the emergency department.  Gastroenterology was contacted.  His white count was 44.4 with 87% neutrophils, 4% lymphocytes and 5% monocytes.  Hemoglobin 7.9 g/dL and platelets 233.  PT and INR are normal.  Sodium was 125, potassium 4.7, chloride 89 and CO2 26 mmol/L.  Glucose 128, BUN 41 and creatinine 0.77.   Total protein is 6.4 and albumin 3.0 g/dL.  The rest of the LFTs are within normal limits.  Chest radiograph reveals a right perihilar and hilar mass progressed compared to prior chest radiograph from 2018 consistent with the patient on lung cancer.  There was no focality/infiltrate seen.  Review of Systems: As per HPI otherwise 10 point review of systems negative.   Past Medical History:  Diagnosis Date  . 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 Repeat US due 2023  . Carotid arterial disease ([place])    a. S/p R CEA;  b. 05/2015 Carotid U/S: RICA 3-81%, LICA 82-99%.  . Coronary artery disease    a. 2012 MI in [place] w/ PCI  //  b. 04/2013 MV: inflat scar, no ischemia.  //  c.   NSTEMI 5/17 >> Promus DES to LCx and POBA to OM1 (ISR).   . Diabetes mellitus without complication ([place])   . Grade II diastolic dysfunction 37/16/9678  . History of cardiac catheterization 10/21/15   a. NSTEMI 5/17: oLAD 40, pLCx 90 (Promus DES), OM2 90 ISR (POBA), oRCA 25   . History of echocardiogram    a. Mild LVH, EF 50-55%, possible inferolateral HK, grade 2 diastolic dysfunction, trivial AI, MAC  . History of nuclear stress test 04/2013   a. Myoview 12/14: Intermediate risk, prior inferolateral MI, no ischemia, EF 45%  . Hx of leukocytosis   . Hyperlipidemia   . Hypertension   . Hypertensive heart disease   . Morbid obesity ([place])   . Tobacco abuse     Past Surgical History:  Procedure Laterality Date  . CARDIAC CATHETERIZATION    . CARDIAC CATHETERIZATION N/A 10/21/2015  Procedure: Left Heart Cath and Coronary Angiography;  Surgeon: [name], MD;  Location: [facility];  Service: Cardiovascular;  Laterality: N/A;  . CARDIAC CATHETERIZATION N/A 10/21/2015   Procedure: Coronary Stent Intervention;  Surgeon: [name], MD;  Location: [facility];  Service: Cardiovascular;  Laterality: N/A;  . CARDIAC CATHETERIZATION N/A 10/21/2015   Procedure: Coronary Balloon  Angioplasty;  Surgeon: [name], MD;  Location: [facility];  Service: Cardiovascular;  Laterality: N/A;  . CAROTID ENDARTERECTOMY    . CORONARY ANGIOPLASTY    . CORONARY STENT PLACEMENT    . VIDEO BRONCHOSCOPY Bilateral 04/03/2017   Procedure: VIDEO BRONCHOSCOPY WITH FLUORO;  Surgeon: [name], MD;  Location: [facility];  Service: Cardiopulmonary;  Laterality: Bilateral;     reports that he quit smoking about 2 years ago. His smoking use included cigarettes. He started smoking about 46 years ago. He has a 30.00 pack-year smoking history. He has never used smokeless tobacco. He reports that he does not drink alcohol or use drugs.  Allergies  Allergen Reactions  . Brilinta [Ticagrelor] Shortness Of Breath    Family History  Problem Relation Age of Onset  . Other Mother        died @ 55 - complications following surgery.  . Hypertension Father        died in MVA @ 34   Prior to Admission medications   Medication Sig Start Date End Date Taking? Authorizing Provider  AMBULATORY NON FORMULARY MEDICATION Power Wheelchair and accessories Diagnoses Stage IV squamous cell carcinoma of right lung - C34.91, Debility R53.81 06/12/18   [name], PA-C  aspirin EC 81 MG tablet Take 81 mg by mouth daily.    [provider]  atorvastatin (LIPITOR) 40 MG tablet TAKE 1 TABLET BY MOUTH EVERY DAY 06/17/18   Fay Records, MD  brimonidine [facility]) 0.2 % ophthalmic solution Place 3 drops into both eyes as needed. Use as directed 12/04/17   [provider]  Carboxymethylcellulose Sodium (THERATEARS) 0.25 % SOLN Apply to eye.    [provider]  HYDROcodone-acetaminophen (NORCO/VICODIN) 5-325 MG tablet 1/2 to 1 tablet every 6 hours as needed for pain and cough 05/20/18   [provider]  metoprolol succinate (TOPROL-XL) 25 MG 24 hr tablet Take 2 tablets (50 mg total) by mouth 2 (two) times daily. 05/06/18   Fay Records, MD    mirtazapine (REMERON) 30 MG tablet Take 1 tablet (30 mg total) by mouth at bedtime. 06/10/18   Curt Bears, MD  naloxegol oxalate (MOVANTIK) 25 MG TABS tablet Take 1 tablet (25 mg total) by mouth daily after breakfast. Take 1h before or 2h after meal 05/28/18   [name], PA-C  Polyethyl Glycol-Propyl Glycol 0.4-0.3 % SOLN Place 1 drop into both eyes as needed for dry eyes. 12/04/17   [provider]  predniSONE (DELTASONE) 10 MG tablet Take 0.5 tablets (5 mg total) by mouth 2 (two) times daily with a meal. 06/10/18   Curt Bears, MD    Physical Exam: Vitals:   06/21/18 1218 06/21/18 1230 06/21/18 1300 06/21/18 1313  BP: 100/73 103/70 98/69   Pulse: 90 91 94   Resp: (!) 24 (!) 25 (!) 28   Temp:    97.6 F (36.4 C)  TempSrc:    Axillary  SpO2: 100% 100% 99%     Constitutional: Cachectic.  Looks chronically ill. Eyes: PERRL, lids and conjunctivae are very pale.  There is right corneal opacity. ENMT: Mucous membranes are dry.  Posterior pharynx clear of any exudate or lesions. Neck: normal, supple, no masses, no thyromegaly Respiratory: Decreased breath sounds on right lung field, no wheezing, no crackles. Normal respiratory effort. No accessory muscle use.  Cardiovascular: Regular rate and rhythm, no murmurs / rubs / gallops. No extremity edema. 2+ pedal pulses. No carotid bruits.  Abdomen: Soft, mild epigastric tenderness, no masses palpated. No hepatosplenomegaly. Bowel sounds positive.  Musculoskeletal: no clubbing / cyanosis. Good ROM, no contractures. Normal muscle tone.  Skin: Overall the skin is pale.  Onychomycosis.  Cyanosis of plantar area. Neurologic: CN 2-12 grossly intact. Sensation intact, DTR normal.  Generalized weakness. Psychiatric: Normal judgment and insight. Alert and oriented x 4. Normal mood.   Labs on Admission: I have personally reviewed following labs and imaging studies  CBC: Recent Labs  Lab 06/21/18 1207  WBC 44.4*   NEUTROABS 38.8*  HGB 7.9*  HCT 25.2*  MCV 85.4  PLT 956   Basic Metabolic Panel: Recent Labs  Lab 06/21/18 1207  NA 125*  K 4.7  CL 89*  CO2 26  GLUCOSE 120*  BUN 41*  CREATININE 0.77  CALCIUM 9.7   GFR: Estimated Creatinine Clearance: 75.6 mL/min (by C-G formula based on SCr of 0.77 mg/dL). Liver Function Tests: Recent Labs  Lab 06/21/18 1207  AST 20  ALT 15  ALKPHOS 91  BILITOT 1.2  PROT 6.4*  ALBUMIN 3.0*   Recent Labs  Lab 06/21/18 1207  LIPASE 27   No results for input(s): AMMONIA in the last 168 hours. Coagulation Profile: No results for input(s): INR, PROTIME in the last 168 hours. Cardiac Enzymes: No results for input(s): CKTOTAL, CKMB, CKMBINDEX, TROPONINI in the last 168 hours. BNP (last 3 results) No results for input(s): PROBNP in the last 8760 hours. HbA1C: No results for input(s): HGBA1C in the last 72 hours. CBG: No results for input(s): GLUCAP in the last 168 hours. Lipid Profile: No results for input(s): CHOL, HDL, LDLCALC, TRIG, CHOLHDL, LDLDIRECT in the last 72 hours. Thyroid Function Tests: No results for input(s): TSH, T4TOTAL, FREET4, T3FREE, THYROIDAB in the last 72 hours. Anemia Panel: No results for input(s): VITAMINB12, FOLATE, FERRITIN, TIBC, IRON, RETICCTPCT in the last 72 hours. Urine analysis: No results found for: COLORURINE, APPEARANCEUR, LABSPEC, PHURINE, GLUCOSEU, HGBUR, BILIRUBINUR, KETONESUR, PROTEINUR, UROBILINOGEN, NITRITE, LEUKOCYTESUR  Radiological Exams on Admission: Dg Chest 2 View  Result Date: 06/21/2018 CLINICAL DATA:  History of lung cancer.  Weight loss. EXAM: CHEST - 2 VIEW COMPARISON:  March 12, 2017 FINDINGS: The heart size is normal. Right central venous line is identified in the superior vena cava. There is right perihilar and hilar mass progressed compared to prior chest x-ray of 2018 consistent with patient's known lung cancer. The left lung is clear. Nipple shadow is identified in the left lung  base. The visualized skeletal structures are stable. IMPRESSION: Right perihilar and hilar mass progressed compared to prior chest x-ray of 2018 consistent with patient's known lung cancer. No focal pneumonia is noted. Electronically Signed   By: Abelardo Diesel M.D.   On: 06/21/2018 10:27   10/2017 echocardiogram ------------------------------------------------------------------- LV EF: 50% -   55%  ------------------------------------------------------------------- Indications:      I25.10 CAD native vessel.  ------------------------------------------------------------------- History:   PMH:  Lung cancer.  PMH:   Myocardial infarction.  Risk factors:  Current tobacco use. Hypertension. Diabetes mellitus. Dyslipidemia.  ------------------------------------------------------------------- Study Conclusions  - Left ventricle: The cavity size was  normal. There was mild   concentric hypertrophy. Systolic function was normal. The   estimated ejection fraction was in the range of 50% to 55%. Basal   inferolateral hypokinesis. Doppler parameters are consistent with   abnormal left ventricular relaxation (grade 1 diastolic   dysfunction). - Aortic valve: There was no regurgitation. - Mitral valve: Calcified annulus. Mildly thickened leaflets .   There was trivial regurgitation. - Right ventricle: The cavity size was mildly dilated. Wall   thickness was normal. Systolic function was mildly reduced. - Right atrium: The atrium was normal in size. - Tricuspid valve: There was no regurgitation. - Pulmonary arteries: Systolic pressure was within the normal   range. - Inferior vena cava: The vessel was normal in size. - Pericardium, extracardiac: There was no pericardial effusion.  EKG: Independently reviewed.  Vent. rate 90 BPM PR interval * ms QRS duration 91 ms QT/QTc 374/458 ms P-R-T axes 70 25 78 Sinus rhythm. Borderline T wave abnormalities.  Assessment/Plan Principal Problem:    Hematochezia Had a significant drop of hemoglobin from 11.9 to 7.9 g/dL in 11 days. Keep n.p.o. until he is seen by gastroenterology. Protonix 40 mg IVP every 24 hours. Treat constipation aggressively. Monitor hematocrit and hemoglobin.  Active Problems:   Hyponatremia Likely from GI losses, decreased oral intake.  SIADH should be considered. Trial isotonic IVF. Follow up sodium level. Expand work-up if no improvement.    Leukocytosis Secondary to taking prednisone 10 mg p.o. twice daily. No fever, no other suspicious symptoms. Urinalysis and chest radiograph did not show signs of active infection. Follow-up CBC with differential in a.m.    Hyperlipidemia Continue atorvastatin 40 mg p.o. daily.    Coronary artery disease Hold aspirin for now. Hold metoprolol due to hypotension.    Diabetes mellitus without complication (HCC) CBG monitoring before meals and bedtime.    Chronic obstructive pulmonary disease (HCC) Supplemental oxygen and bronchodilators as needed.    Grade II diastolic dysfunction No signs of decompensation at this time. Most recent echo shows grade 1 diastolic dysfunction.    Stage IV squamous cell carcinoma of right lung [facility]) Continue treatment and follow-up per oncology team. Consult palliative care.      DVT prophylaxis: SCDs. Code Status: DNR. Family Communication: His wife Disposition Plan: Admit for H&H monitoring, as needed transfusion and GI evaluation. Consults called: Gastroenterology was contacted by the EDP. Admission status: Inpatient/telemetry.   [name] MD [facility]  06/21/2018, 1:29 PM

## 2018-06-21 NOTE — ED Notes (Signed)
Report called to receiving RN. Concerns raised about the appropriateness of the patient for that floor. MD paged.

## 2018-06-21 NOTE — ED Notes (Signed)
Bed: KJ17 Expected date:  Expected time:  Means of arrival:  Comments: EMS-possible bleed-room 15

## 2018-06-21 NOTE — H&P (View-Only) (Signed)
Referring Provider:  Dr. Ronnald Nian, EDP Primary Care Physician:  [name], [place] Primary Gastroenterologist:  Althia Forts  Reason for Consultation:  GI bleed and anemia  HPI: Randy [name] is a 73 y.o. male with medical history significant of abdominal aortic ectasia, coronary artery disease on Plavix, history of NSTEMI in May 2330, grade 2 diastolic dysfunction, LVH, type 2 diabetes, hyperlipidemia, hypertension, history of leukocytosis, tobacco abuse, chronic constipation, history of stage IV non-small lung cancer (not currently on treatment) who came to the emergency department with complaints of rectal bleeding and weakness/SOB.  His wife says that he had a dark BM this morning, but they called EMS because he was SOB.  Then when EMS arrived he passed a large amount of blood per rectum.  Never had any sign of GI bleeding in the past.  They tell us that he's had a lot of issues with constipation.  Is on Movantik at home and was using Miralax on and off.  Last week had a very large "obstruction" or "impaction" with a stool the size of a baseball.  This week they say that he has been moving his bowels better over the past few days.  No complaints of rectal pain.  Reports intermittent abdominal pains in mid/lower abdomen.  No nausea or vomiting.  Poor appetite and has lost a lot of weight.  No GI history/evluation in the past.  ED Course: Initial vital signs temperature 96.4 F, pulse 91, respirations 28, blood pressure 97/71 mmHg and O2 sat 100% on nasal cannula oxygen.   His white count was 44.4 with 87% neutrophils, 4% lymphocytes and 5% monocytes.  Hemoglobin 7.9 g/dL and platelets 233.  PT and INR are normal.  Sodium was 125, potassium 4.7, chloride 89 and CO2 26 mmol/L.  Glucose 128, BUN 41 and creatinine 0.77.  Total protein is 6.4 and albumin 3.0 g/dL.  The rest of the LFTs are within normal limits.  Chest radiograph reveals a right perihilar and hilar mass progressed compared  to prior chest radiograph from 2018 consistent with the patient on lung cancer.  There was no focality/infiltrate seen.  His Hgb just 11 days ago was 11.9 grams.  He is receiving 2 units PRBC's.   Past Medical History:  Diagnosis Date  . 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 Repeat US due 2023  . Carotid arterial disease ([facility])    a. S/p R CEA;  b. 05/2015 Carotid U/S: RICA 0-76%, LICA 22-63%.  . Coronary artery disease    a. 2012 MI in [place] w/ PCI  //  b. 04/2013 MV: inflat scar, no ischemia.  //  c.   NSTEMI 5/17 >> Promus DES to LCx and POBA to OM1 (ISR).   . Diabetes mellitus without complication ([place])   . Grade II diastolic dysfunction 33/54/5625  . History of cardiac catheterization 10/21/15   a. NSTEMI 5/17: oLAD 40, pLCx 90 (Promus DES), OM2 90 ISR (POBA), oRCA 25   . History of echocardiogram    a. Mild LVH, EF 50-55%, possible inferolateral HK, grade 2 diastolic dysfunction, trivial AI, MAC  . History of nuclear stress test 04/2013   a. Myoview 12/14: Intermediate risk, prior inferolateral MI, no ischemia, EF 45%  . Hx of leukocytosis   . Hyperlipidemia   . Hypertension   . Hypertensive heart disease   . Morbid obesity ([place])   . Tobacco abuse     Past Surgical History:  Procedure Laterality Date  . CARDIAC  CATHETERIZATION    . CARDIAC CATHETERIZATION N/A 10/21/2015   Procedure: Left Heart Cath and Coronary Angiography;  Surgeon: [name], MD;  Location: [facility];  Service: Cardiovascular;  Laterality: N/A;  . CARDIAC CATHETERIZATION N/A 10/21/2015   Procedure: Coronary Stent Intervention;  Surgeon: [name], MD;  Location: [facility];  Service: Cardiovascular;  Laterality: N/A;  . CARDIAC CATHETERIZATION N/A 10/21/2015   Procedure: Coronary Balloon Angioplasty;  Surgeon: [name], MD;  Location: [facility];  Service: Cardiovascular;  Laterality: N/A;  . CAROTID ENDARTERECTOMY    . CORONARY ANGIOPLASTY     . CORONARY STENT PLACEMENT    . VIDEO BRONCHOSCOPY Bilateral 04/03/2017   Procedure: VIDEO BRONCHOSCOPY WITH FLUORO;  Surgeon: [name], MD;  Location: [facility];  Service: Cardiopulmonary;  Laterality: Bilateral;    Prior to Admission medications   Medication Sig Start Date End Date Taking? Authorizing Provider  acetaminophen (TYLENOL) 500 MG tablet Take 1,000 mg by mouth every 6 (six) hours as needed for moderate pain.   Yes [provider]  aspirin EC 81 MG tablet Take 81 mg by mouth daily.   Yes [provider]  atorvastatin (LIPITOR) 40 MG tablet TAKE 1 TABLET BY MOUTH EVERY DAY Patient taking differently: Take 40 mg by mouth daily.  06/17/18  Yes Fay Records, MD  Carboxymethylcellulose Sodium (THERATEARS) 0.25 % SOLN Place 1 drop into both eyes 4 (four) times daily.    Yes [provider]  chlorpheniramine-HYDROcodone (TUSSIONEX PENNKINETIC ER) 10-8 MG/5ML SUER Take 5 mLs by mouth at bedtime as needed for cough.   Yes [provider]  erythromycin ophthalmic ointment Place 1 application into the right eye at bedtime. 06/20/18  Yes [provider]  HYDROcodone-acetaminophen (NORCO/VICODIN) 5-325 MG tablet Take 1 tablet by mouth every 6 (six) hours as needed for moderate pain.  05/20/18  Yes [provider]  metoprolol succinate (TOPROL-XL) 25 MG 24 hr tablet Take 2 tablets (50 mg total) by mouth 2 (two) times daily. 05/06/18  Yes Fay Records, MD  moxifloxacin (VIGAMOX) 0.5 % ophthalmic solution Place 1 drop into the right eye 4 (four) times daily. 06/19/18  Yes [provider]  naloxegol oxalate (MOVANTIK) 25 MG TABS tablet Take 1 tablet (25 mg total) by mouth daily after breakfast. Take 1h before or 2h after meal 05/28/18  Yes Cummings, Elson Areas, PA-C  predniSONE (DELTASONE) 10 MG tablet Take 0.5 tablets (5 mg total) by mouth 2 (two) times daily with a meal. 06/10/18  Yes Curt Bears, MD  Probiotic  Product (PROBIOTIC PO) Take 1 capsule by mouth daily.   Yes [provider]  traZODone (DESYREL) 50 MG tablet Take 50 mg by mouth at bedtime as needed for sleep.  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 Patient not taking: Reported on 06/21/2018 06/10/18   Curt Bears, MD    Current Facility-Administered Medications  Medication Dose Route Frequency Provider Last Rate Last Dose  . 0.9 %  sodium chloride infusion   Intravenous Continuous [name], MD   Stopped at 06/21/18 1401  . acetaminophen (TYLENOL) tablet 650 mg  650 mg Oral Q6H PRN [name], MD  Or  . acetaminophen (TYLENOL) suppository 650 mg  650 mg Rectal Q6H PRN [name], MD      . ondansetron [facility]) tablet 4 mg  4 mg Oral Q6H PRN [name], MD       Or  . ondansetron [facility]) injection 4 mg  4 mg Intravenous Q6H PRN [name], MD      . Derrill Memo ON 06/22/2018] pantoprazole (PROTONIX) injection 40 mg  40 mg Intravenous Q24H [name], MD      . sodium chloride 0.9 % bolus 1,000 mL  1,000 mL Intravenous Once [name], MD   Stopped at 06/21/18 1401   Current Outpatient Medications  Medication Sig Dispense Refill  . acetaminophen (TYLENOL) 500 MG tablet Take 1,000 mg by mouth every 6 (six) hours as needed for moderate pain.    [name] aspirin EC 81 MG tablet Take 81 mg by mouth daily.    [name] atorvastatin (LIPITOR) 40 MG tablet TAKE 1 TABLET BY MOUTH EVERY DAY (Patient taking differently: Take 40 mg by mouth daily. ) 90 tablet 3  . Carboxymethylcellulose Sodium (THERATEARS) 0.25 % SOLN Place 1 drop into both eyes 4 (four) times daily.     . chlorpheniramine-HYDROcodone (TUSSIONEX  PENNKINETIC ER) 10-8 MG/5ML SUER Take 5 mLs by mouth at bedtime as needed for cough.    . erythromycin ophthalmic ointment Place 1 application into the right eye at bedtime.    [name] HYDROcodone-acetaminophen (NORCO/VICODIN) 5-325 MG tablet Take 1 tablet by mouth every 6 (six) hours as needed for moderate pain.     . metoprolol succinate (TOPROL-XL) 25 MG 24 hr tablet Take 2 tablets (50 mg total) by mouth 2 (two) times daily. 120 tablet 11  . moxifloxacin (VIGAMOX) 0.5 % ophthalmic solution Place 1 drop into the right eye 4 (four) times daily.    . naloxegol oxalate (MOVANTIK) 25 MG TABS tablet Take 1 tablet (25 mg total) by mouth daily after breakfast. Take 1h before or 2h after meal 30 tablet 3  . predniSONE (DELTASONE) 10 MG tablet Take 0.5 tablets (5 mg total) by mouth 2 (two) times daily with a meal. 60 tablet 0  . Probiotic Product (PROBIOTIC PO) Take 1 capsule by mouth daily.    . traZODone (DESYREL) 50 MG tablet Take 50 mg by mouth at bedtime as needed for sleep.     . 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 mirtazapine (REMERON) 30 MG tablet Take 1 tablet (30 mg total) by mouth at bedtime. (Patient not taking: Reported on 06/21/2018) 30 tablet 2    Allergies as of 06/21/2018 - Review Complete 06/21/2018  Allergen Reaction Noted  . Brilinta [ticagrelor] Shortness Of Breath 03/12/2017    Family History  Problem Relation Age of Onset  . Other Mother        died @ 50 - complications following surgery.  . Hypertension Father        died in MVA @ 42    Social History   Socioeconomic History  . Marital status: Married    Spouse name: ardith  . Number of children: 7  . Years of education: college  . Highest education level: Not on file  Occupational History  . Occupation: retired  Scientific laboratory technician  . Financial resource strain: Not on file  . Food insecurity:     Worry: Not on file  Inability: Not on file  . Transportation needs:    Medical: Not on file    Non-medical: Not on file  Tobacco Use  . Smoking status: Former Smoker    Packs/day: 0.75    Years: 40.00    Pack years: 30.00    Types: Cigarettes    Start date: 06/18/1972    Last attempt to quit: 10/24/2015    Years since quitting: 2.6  . Smokeless tobacco: Never Used  Substance and Sexual Activity  . Alcohol use: No    Alcohol/week: 0.0 standard drinks  . Drug use: No  . Sexual activity: Not Currently  Lifestyle  . Physical activity:    Days per week: Not on file    Minutes per session: Not on file  . Stress: Not on file  Relationships  . Social connections:    Talks on phone: Not on file    Gets together: Not on file    Attends religious service: Not on file    Active member of club or organization: Not on file    Attends meetings of clubs or organizations: Not on file    Relationship status: Not on file  . Intimate partner violence:    Fear of current or ex partner: Not on file    Emotionally abused: Not on file    Physically abused: Not on file    Forced sexual activity: Not on file  Other Topics Concern  . Not on file  Social History Narrative   Lives in [place] with wife.  Does not routinely exercise.  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    Review of Systems: ROS is O/W negative except as mentioned in HPI.  Physical Exam: Vital signs in last 24 hours: Temp:  [96.4 F (35.8 C)-97.6 F (36.4 C)] 97.5 F (36.4 C) (01/24 1406) Pulse Rate:  [88-95] 95 (01/24 1406) Resp:  [23-28] 27 (01/24 1406) BP: (97-109)/(67-85) 97/71 (01/24 1406) SpO2:  [99 %-100 %] 100 % (01/24 1406)   General:  Alert, chronically ill-appearing, pleasant and cooperative in NAD Head:  Normocephalic and atraumatic. Eyes:  Sclera clear, no icterus.  Conjunctiva pale. Ears:  Normal auditory acuity. Mouth:  No deformity or lesions.   Lungs:  Clear  throughout to auscultation.  No wheezes, crackles, or rhonchi.  Heart:  Regular rate and rhythm; no murmurs, clicks, rubs, or gallops. Abdomen:  Soft, scaphoid, non-distended.  BS present.  Non-tender. Rectal:  Dark/maroon stools/overt blood per EDP.  Msk:  Symmetrical without gross deformities. Pulses:  Normal pulses noted. Extremities:  Some edema noted in B/L LE's.  Toes look to have compromised vascular supply.   Neurologic:  Alert and oriented x 4;  grossly normal neurologically. Skin:  Intact without significant lesions or rashes. Psych:  Alert and cooperative. Normal mood and affect.  Intake/Output this shift: Total I/O In: 30 [Blood:30] Out: -   Lab Results: Recent Labs    06/21/18 1207  WBC 44.4*  HGB 7.9*  HCT 25.2*  PLT 233   BMET Recent Labs    06/21/18 1207  NA 125*  K 4.7  CL 89*  CO2 26  GLUCOSE 120*  BUN 41*  CREATININE 0.77  CALCIUM 9.7   LFT Recent Labs    06/21/18 1207  PROT 6.4*  ALBUMIN 3.0*  AST 20  ALT 15  ALKPHOS 91  BILITOT 1.2   PT/INR Recent Labs    06/21/18 1207 06/21/18 1400  LABPROT 14.5 14.5  INR 1.14 1.14   Studies/Results: Dg Chest 2 View  Result Date: 06/21/2018 CLINICAL DATA:  History of lung cancer.  Weight loss. EXAM: CHEST - 2 VIEW COMPARISON:  March 12, 2017 FINDINGS: The heart size is normal. Right central venous line is identified in the superior vena cava. There is right perihilar and hilar mass progressed compared to prior chest x-ray of 2018 consistent with patient's known lung cancer. The left lung is clear. Nipple shadow is identified in the left lung base. The visualized skeletal structures are stable. IMPRESSION: Right perihilar and hilar mass progressed compared to prior chest x-ray of 2018 consistent with patient's known lung cancer. No focal pneumonia is noted. Electronically Signed   By: Abelardo Diesel M.D.   On: 06/21/2018 10:27   IMPRESSION:  *GI bleed:  Just started this morning with only one large  episode.  No sign of bleeding prior to this.  This was preceded by some issues with constipation.  Last week had a very large hard stool that they describe to be the size of a baseball.  Unsure of source of bleeding, but cold be internal hemorrhoid or even a stercoral ulcer.  No GI history or evaluation in the past.   *ABLA:  Hgb down 4 grams as compared to 11 days ago, but once again, only has had one episode of rectal bleeding so may be multifactorial drop.  Receiving 2 units PRBC's. *Constipation:  Was on Relastor at home and was using Miralax on and off as well. *CAD on Plavix:  Likely should hold for now if possible. *Stage IV non-small cell lung cancer:  Progressing.  Not currently on treatment.  PLAN: *No plans for colonoscopy at this time.  We do not think that he is a candidate for colonoscopy due to his poor functional status and advanced malignancy, although, if bleeding continues despite conservative measures then may need to reconsider. *Recommend good bowel regimen with his home dose of Relastor and Miralax BID. *Will start hydrocortisone suppositories BID as well. *Consider palliative care consult while he is here. *Monitor Hgb and transfuse further prn.  Laban Emperor. Zehr  06/21/2018, 2:25 PM    Attending physician's note   I have taken a history, examined the patient and reviewed the chart. I agree with the Advanced Practitioner's note, impression and recommendations.  Stage IV non-small cell lung cancer. Severe leukocytosis Lower extremity toes blue discoloration concerning for ischemic changes/necrosis Anemia with 4 g drop in hemoglobin in the past 2 weeks.  He had an episode of hematochezia this afternoon, may be a cup of blood per his wife.  Denies any rectal bleeding prior to this.  history of chronic constipation, likely opiate induced. On Movantik and prn Miralax Anemia likely multifactorial.  Etiology of hematochezia: Internal hemorrhoid hemorrhage versus rectal  stercoral ulcer Will manage conservatively, increase MiraLAX 1 capful twice daily, continue Movantik Rectal hydrocortisone suppositories twice daily Will hold off colonoscopy unless continues to have persistent rectal hemorrhage. Severe protein energy malnutrition with significant weight loss Monitor hemoglobin and transfuse as needed Overall poor prognosis Consult palliative care  Damaris Hippo , MD [phone]

## 2018-06-21 NOTE — Consult Note (Addendum)
Referring Provider:  Dr. Ronnald Nian, EDP Primary Care Physician:  [name], [place] Primary Gastroenterologist:  Althia Forts  Reason for Consultation:  GI bleed and anemia  HPI: Randy [name] is a 73 y.o. male with medical history significant of abdominal aortic ectasia, coronary artery disease on Plavix, history of NSTEMI in May 3532, grade 2 diastolic dysfunction, LVH, type 2 diabetes, hyperlipidemia, hypertension, history of leukocytosis, tobacco abuse, chronic constipation, history of stage IV non-small lung cancer (not currently on treatment) who came to the emergency department with complaints of rectal bleeding and weakness/SOB.  His wife says that he had a dark BM this morning, but they called EMS because he was SOB.  Then when EMS arrived he passed a large amount of blood per rectum.  Never had any sign of GI bleeding in the past.  They tell us that he's had a lot of issues with constipation.  Is on Movantik at home and was using Miralax on and off.  Last week had a very large "obstruction" or "impaction" with a stool the size of a baseball.  This week they say that he has been moving his bowels better over the past few days.  No complaints of rectal pain.  Reports intermittent abdominal pains in mid/lower abdomen.  No nausea or vomiting.  Poor appetite and has lost a lot of weight.  No GI history/evluation in the past.  ED Course: Initial vital signs temperature 96.4 F, pulse 91, respirations 28, blood pressure 97/71 mmHg and O2 sat 100% on nasal cannula oxygen.   His white count was 44.4 with 87% neutrophils, 4% lymphocytes and 5% monocytes.  Hemoglobin 7.9 g/dL and platelets 233.  PT and INR are normal.  Sodium was 125, potassium 4.7, chloride 89 and CO2 26 mmol/L.  Glucose 128, BUN 41 and creatinine 0.77.  Total protein is 6.4 and albumin 3.0 g/dL.  The rest of the LFTs are within normal limits.  Chest radiograph reveals a right perihilar and hilar mass progressed compared  to prior chest radiograph from 2018 consistent with the patient on lung cancer.  There was no focality/infiltrate seen.  His Hgb just 11 days ago was 11.9 grams.  He is receiving 2 units PRBC's.   Past Medical History:  Diagnosis Date  . 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 Repeat US due 2023  . Carotid arterial disease ([place])    a. S/p R CEA;  b. 05/2015 Carotid U/S: RICA 9-92%, LICA 42-68%.  . Coronary artery disease    a. 2012 MI in [place] w/ PCI  //  b. 04/2013 MV: inflat scar, no ischemia.  //  c.   NSTEMI 5/17 >> Promus DES to LCx and POBA to OM1 (ISR).   . Diabetes mellitus without complication ([place])   . Grade II diastolic dysfunction 34/19/6222  . History of cardiac catheterization 10/21/15   a. NSTEMI 5/17: oLAD 40, pLCx 90 (Promus DES), OM2 90 ISR (POBA), oRCA 25   . History of echocardiogram    a. Mild LVH, EF 50-55%, possible inferolateral HK, grade 2 diastolic dysfunction, trivial AI, MAC  . History of nuclear stress test 04/2013   a. Myoview 12/14: Intermediate risk, prior inferolateral MI, no ischemia, EF 45%  . Hx of leukocytosis   . Hyperlipidemia   . Hypertension   . Hypertensive heart disease   . Morbid obesity ([place])   . Tobacco abuse     Past Surgical History:  Procedure Laterality Date  . CARDIAC  CATHETERIZATION    . CARDIAC CATHETERIZATION N/A 10/21/2015   Procedure: Left Heart Cath and Coronary Angiography;  Surgeon: [name], MD;  Location: [place];  Service: Cardiovascular;  Laterality: N/A;  . CARDIAC CATHETERIZATION N/A 10/21/2015   Procedure: Coronary Stent Intervention;  Surgeon: [name], MD;  Location: [facility];  Service: Cardiovascular;  Laterality: N/A;  . CARDIAC CATHETERIZATION N/A 10/21/2015   Procedure: Coronary Balloon Angioplasty;  Surgeon: [name], MD;  Location: [facility];  Service: Cardiovascular;  Laterality: N/A;  . CAROTID ENDARTERECTOMY    . CORONARY ANGIOPLASTY     . CORONARY STENT PLACEMENT    . VIDEO BRONCHOSCOPY Bilateral 04/03/2017   Procedure: VIDEO BRONCHOSCOPY WITH FLUORO;  Surgeon: [name], MD;  Location: [facility];  Service: Cardiopulmonary;  Laterality: Bilateral;    Prior to Admission medications   Medication Sig Start Date End Date Taking? Authorizing Provider  acetaminophen (TYLENOL) 500 MG tablet Take 1,000 mg by mouth every 6 (six) hours as needed for moderate pain.   Yes [provider]  aspirin EC 81 MG tablet Take 81 mg by mouth daily.   Yes [provider]  atorvastatin (LIPITOR) 40 MG tablet TAKE 1 TABLET BY MOUTH EVERY DAY Patient taking differently: Take 40 mg by mouth daily.  06/17/18  Yes Fay Records, MD  Carboxymethylcellulose Sodium (THERATEARS) 0.25 % SOLN Place 1 drop into both eyes 4 (four) times daily.    Yes [provider]  chlorpheniramine-HYDROcodone (TUSSIONEX PENNKINETIC ER) 10-8 MG/5ML SUER Take 5 mLs by mouth at bedtime as needed for cough.   Yes [provider]  erythromycin ophthalmic ointment Place 1 application into the right eye at bedtime. 06/20/18  Yes [provider]  HYDROcodone-acetaminophen (NORCO/VICODIN) 5-325 MG tablet Take 1 tablet by mouth every 6 (six) hours as needed for moderate pain.  05/20/18  Yes [provider]  metoprolol succinate (TOPROL-XL) 25 MG 24 hr tablet Take 2 tablets (50 mg total) by mouth 2 (two) times daily. 05/06/18  Yes Fay Records, MD  moxifloxacin (VIGAMOX) 0.5 % ophthalmic solution Place 1 drop into the right eye 4 (four) times daily. 06/19/18  Yes [provider]  naloxegol oxalate (MOVANTIK) 25 MG TABS tablet Take 1 tablet (25 mg total) by mouth daily after breakfast. Take 1h before or 2h after meal 05/28/18  Yes Cummings, Elson Areas, PA-C  predniSONE (DELTASONE) 10 MG tablet Take 0.5 tablets (5 mg total) by mouth 2 (two) times daily with a meal. 06/10/18  Yes Curt Bears, MD  Probiotic  Product (PROBIOTIC PO) Take 1 capsule by mouth daily.   Yes [provider]  traZODone (DESYREL) 50 MG tablet Take 50 mg by mouth at bedtime as needed for sleep.  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 Patient not taking: Reported on 06/21/2018 06/10/18   Curt Bears, MD    Current Facility-Administered Medications  Medication Dose Route Frequency Provider Last Rate Last Dose  . 0.9 %  sodium chloride infusion   Intravenous Continuous [name], MD   Stopped at 06/21/18 1401  . acetaminophen (TYLENOL) tablet 650 mg  650 mg Oral Q6H PRN [name], MD  Or  . acetaminophen (TYLENOL) suppository 650 mg  650 mg Rectal Q6H PRN [name], MD      . ondansetron [facility]) tablet 4 mg  4 mg Oral Q6H PRN [name], MD       Or  . ondansetron [facility]) injection 4 mg  4 mg Intravenous Q6H PRN [name], MD      . Derrill Memo ON 06/22/2018] pantoprazole (PROTONIX) injection 40 mg  40 mg Intravenous Q24H [name], MD      . sodium chloride 0.9 % bolus 1,000 mL  1,000 mL Intravenous Once [name], MD   Stopped at 06/21/18 1401   Current Outpatient Medications  Medication Sig Dispense Refill  . acetaminophen (TYLENOL) 500 MG tablet Take 1,000 mg by mouth every 6 (six) hours as needed for moderate pain.    [name] aspirin EC 81 MG tablet Take 81 mg by mouth daily.    [name] atorvastatin (LIPITOR) 40 MG tablet TAKE 1 TABLET BY MOUTH EVERY DAY (Patient taking differently: Take 40 mg by mouth daily. ) 90 tablet 3  . Carboxymethylcellulose Sodium (THERATEARS) 0.25 % SOLN Place 1 drop into both eyes 4 (four) times daily.     . chlorpheniramine-HYDROcodone (TUSSIONEX  PENNKINETIC ER) 10-8 MG/5ML SUER Take 5 mLs by mouth at bedtime as needed for cough.    . erythromycin ophthalmic ointment Place 1 application into the right eye at bedtime.    [name] HYDROcodone-acetaminophen (NORCO/VICODIN) 5-325 MG tablet Take 1 tablet by mouth every 6 (six) hours as needed for moderate pain.     . metoprolol succinate (TOPROL-XL) 25 MG 24 hr tablet Take 2 tablets (50 mg total) by mouth 2 (two) times daily. 120 tablet 11  . moxifloxacin (VIGAMOX) 0.5 % ophthalmic solution Place 1 drop into the right eye 4 (four) times daily.    . naloxegol oxalate (MOVANTIK) 25 MG TABS tablet Take 1 tablet (25 mg total) by mouth daily after breakfast. Take 1h before or 2h after meal 30 tablet 3  . predniSONE (DELTASONE) 10 MG tablet Take 0.5 tablets (5 mg total) by mouth 2 (two) times daily with a meal. 60 tablet 0  . Probiotic Product (PROBIOTIC PO) Take 1 capsule by mouth daily.    . traZODone (DESYREL) 50 MG tablet Take 50 mg by mouth at bedtime as needed for sleep.     . 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 mirtazapine (REMERON) 30 MG tablet Take 1 tablet (30 mg total) by mouth at bedtime. (Patient not taking: Reported on 06/21/2018) 30 tablet 2    Allergies as of 06/21/2018 - Review Complete 06/21/2018  Allergen Reaction Noted  . Brilinta [ticagrelor] Shortness Of Breath 03/12/2017    Family History  Problem Relation Age of Onset  . Other Mother        died @ 42 - complications following surgery.  . Hypertension Father        died in MVA @ 71    Social History   Socioeconomic History  . Marital status: Married    Spouse name: ardith  . Number of children: 7  . Years of education: college  . Highest education level: Not on file  Occupational History  . Occupation: retired  Scientific laboratory technician  . Financial resource strain: Not on file  . Food insecurity:     Worry: Not on file  Inability: Not on file  . Transportation needs:    Medical: Not on file    Non-medical: Not on file  Tobacco Use  . Smoking status: Former Smoker    Packs/day: 0.75    Years: 40.00    Pack years: 30.00    Types: Cigarettes    Start date: 06/18/1972    Last attempt to quit: 10/24/2015    Years since quitting: 2.6  . Smokeless tobacco: Never Used  Substance and Sexual Activity  . Alcohol use: No    Alcohol/week: 0.0 standard drinks  . Drug use: No  . Sexual activity: Not Currently  Lifestyle  . Physical activity:    Days per week: Not on file    Minutes per session: Not on file  . Stress: Not on file  Relationships  . Social connections:    Talks on phone: Not on file    Gets together: Not on file    Attends religious service: Not on file    Active member of club or organization: Not on file    Attends meetings of clubs or organizations: Not on file    Relationship status: Not on file  . Intimate partner violence:    Fear of current or ex partner: Not on file    Emotionally abused: Not on file    Physically abused: Not on file    Forced sexual activity: Not on file  Other Topics Concern  . Not on file  Social History Narrative   Lives in [place] with wife.  Does not routinely exercise.  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    Review of Systems: ROS is O/W negative except as mentioned in HPI.  Physical Exam: Vital signs in last 24 hours: Temp:  [96.4 F (35.8 C)-97.6 F (36.4 C)] 97.5 F (36.4 C) (01/24 1406) Pulse Rate:  [88-95] 95 (01/24 1406) Resp:  [23-28] 27 (01/24 1406) BP: (97-109)/(67-85) 97/71 (01/24 1406) SpO2:  [99 %-100 %] 100 % (01/24 1406)   General:  Alert, chronically ill-appearing, pleasant and cooperative in NAD Head:  Normocephalic and atraumatic. Eyes:  Sclera clear, no icterus.  Conjunctiva pale. Ears:  Normal auditory acuity. Mouth:  No deformity or lesions.   Lungs:  Clear  throughout to auscultation.  No wheezes, crackles, or rhonchi.  Heart:  Regular rate and rhythm; no murmurs, clicks, rubs, or gallops. Abdomen:  Soft, scaphoid, non-distended.  BS present.  Non-tender. Rectal:  Dark/maroon stools/overt blood per EDP.  Msk:  Symmetrical without gross deformities. Pulses:  Normal pulses noted. Extremities:  Some edema noted in B/L LE's.  Toes look to have compromised vascular supply.   Neurologic:  Alert and oriented x 4;  grossly normal neurologically. Skin:  Intact without significant lesions or rashes. Psych:  Alert and cooperative. Normal mood and affect.  Intake/Output this shift: Total I/O In: 30 [Blood:30] Out: -   Lab Results: Recent Labs    06/21/18 1207  WBC 44.4*  HGB 7.9*  HCT 25.2*  PLT 233   BMET Recent Labs    06/21/18 1207  NA 125*  K 4.7  CL 89*  CO2 26  GLUCOSE 120*  BUN 41*  CREATININE 0.77  CALCIUM 9.7   LFT Recent Labs    06/21/18 1207  PROT 6.4*  ALBUMIN 3.0*  AST 20  ALT 15  ALKPHOS 91  BILITOT 1.2   PT/INR Recent Labs    06/21/18 1207 06/21/18 1400  LABPROT 14.5 14.5  INR 1.14 1.14   Studies/Results: Dg Chest 2 View  Result Date: 06/21/2018 CLINICAL DATA:  History of lung cancer.  Weight loss. EXAM: CHEST - 2 VIEW COMPARISON:  March 12, 2017 FINDINGS: The heart size is normal. Right central venous line is identified in the superior vena cava. There is right perihilar and hilar mass progressed compared to prior chest x-ray of 2018 consistent with patient's known lung cancer. The left lung is clear. Nipple shadow is identified in the left lung base. The visualized skeletal structures are stable. IMPRESSION: Right perihilar and hilar mass progressed compared to prior chest x-ray of 2018 consistent with patient's known lung cancer. No focal pneumonia is noted. Electronically Signed   By: Abelardo Diesel M.D.   On: 06/21/2018 10:27   IMPRESSION:  *GI bleed:  Just started this morning with only one large  episode.  No sign of bleeding prior to this.  This was preceded by some issues with constipation.  Last week had a very large hard stool that they describe to be the size of a baseball.  Unsure of source of bleeding, but cold be internal hemorrhoid or even a stercoral ulcer.  No GI history or evaluation in the past.   *ABLA:  Hgb down 4 grams as compared to 11 days ago, but once again, only has had one episode of rectal bleeding so may be multifactorial drop.  Receiving 2 units PRBC's. *Constipation:  Was on Relastor at home and was using Miralax on and off as well. *CAD on Plavix:  Likely should hold for now if possible. *Stage IV non-small cell lung cancer:  Progressing.  Not currently on treatment.  PLAN: *No plans for colonoscopy at this time.  We do not think that he is a candidate for colonoscopy due to his poor functional status and advanced malignancy, although, if bleeding continues despite conservative measures then may need to reconsider. *Recommend good bowel regimen with his home dose of Relastor and Miralax BID. *Will start hydrocortisone suppositories BID as well. *Consider palliative care consult while he is here. *Monitor Hgb and transfuse further prn.  Laban Emperor. Zehr  06/21/2018, 2:25 PM    Attending physician's note   I have taken a history, examined the patient and reviewed the chart. I agree with the Advanced Practitioner's note, impression and recommendations.  Stage IV non-small cell lung cancer. Severe leukocytosis Lower extremity toes blue discoloration concerning for ischemic changes/necrosis Anemia with 4 g drop in hemoglobin in the past 2 weeks.  He had an episode of hematochezia this afternoon, may be a cup of blood per his wife.  Denies any rectal bleeding prior to this.  history of chronic constipation, likely opiate induced. On Movantik and prn Miralax Anemia likely multifactorial.  Etiology of hematochezia: Internal hemorrhoid hemorrhage versus rectal  stercoral ulcer Will manage conservatively, increase MiraLAX 1 capful twice daily, continue Movantik Rectal hydrocortisone suppositories twice daily Will hold off colonoscopy unless continues to have persistent rectal hemorrhage. Severe protein energy malnutrition with significant weight loss Monitor hemoglobin and transfuse as needed Overall poor prognosis Consult palliative care  Damaris Hippo , MD [phone]

## 2018-06-21 NOTE — ED Notes (Signed)
Bed: WA03 Expected date:  Expected time:  Means of arrival:  Comments: 

## 2018-06-21 NOTE — Progress Notes (Signed)
ED TO INPATIENT HANDOFF REPORT  Name/Age/Gender Randy [name] 73 y.o. male  Code Status    Code Status Orders  (From admission, onward)         Start     Ordered   06/21/18 1320  Do not attempt resuscitation (DNR)  Continuous    Question Answer Comment  In the event of cardiac or respiratory ARREST Do not call a "code blue"   In the event of cardiac or respiratory ARREST Do not perform Intubation, CPR, defibrillation or ACLS   In the event of cardiac or respiratory ARREST Use medication by any route, position, wound care, and other measures to relive pain and suffering. May use oxygen, suction and manual treatment of airway obstruction as needed for comfort.      06/21/18 1321        Code Status History    Date Active Date Inactive Code Status Order ID Comments User Context   10/20/2015 2008 10/22/2015 1908 Full Code [id number]  Rogelia Mire, NP Inpatient    Advance Directive Documentation     Most Recent Value  Type of Advance Directive  Living will  Pre-existing out of facility DNR order (yellow form or pink MOST form)  -  "MOST" Form in Place?  -      Home/SNF/Other Home  Chief Complaint GI Bleed  Level of Care/Admitting Diagnosis ED Disposition    ED Disposition Condition [place]: [place] [[id number]]  Level of Care: Telemetry [5]  Admit to tele based on following criteria: Monitor for Ischemic changes  Diagnosis: Hematochezia [409811]  Admitting Physician: [name] [[id number]]  Attending Physician: [name] [[id number]]  Estimated length of stay: past midnight tomorrow  Certification:: I certify this patient will need inpatient services for at least 2 midnights  PT Class (Do Not Modify): Inpatient [101]  PT Acc Code (Do Not Modify): Private [1]       Medical History Past Medical History:  Diagnosis Date  . 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 Repeat US due 2023  .  Carotid arterial disease ([place])    a. S/p R CEA;  b. 05/2015 Carotid U/S: RICA 6-57%, LICA 84-69%.  . Coronary artery disease    a. 2012 MI in [place] w/ PCI  //  b. 04/2013 MV: inflat scar, no ischemia.  //  c.   NSTEMI 5/17 >> Promus DES to LCx and POBA to OM1 (ISR).   . Diabetes mellitus without complication ([place])   . Grade II diastolic dysfunction 62/95/2841  . History of cardiac catheterization 10/21/15   a. NSTEMI 5/17: oLAD 40, pLCx 90 (Promus DES), OM2 90 ISR (POBA), oRCA 25   . History of echocardiogram    a. Mild LVH, EF 50-55%, possible inferolateral HK, grade 2 diastolic dysfunction, trivial AI, MAC  . History of nuclear stress test 04/2013   a. Myoview 12/14: Intermediate risk, prior inferolateral MI, no ischemia, EF 45%  . Hx of leukocytosis   . Hyperlipidemia   . Hypertension   . Hypertensive heart disease   . Morbid obesity ([place])   . Tobacco abuse     Allergies Allergies  Allergen Reactions  . Brilinta [Ticagrelor] Shortness Of Breath    IV Location/Drains/Wounds Patient Lines/Drains/Airways Status   Active Line/Drains/Airways    Name:   Placement date:   Placement time:   Site:   Days:   Implanted Port 06/21/18 Right Chest   06/21/18  1036    Chest   less than 1          Labs/Imaging Results for orders placed or performed during the hospital encounter of 06/21/18 (from the past 48 hour(s))  Type and screen     Status: None (Preliminary result)   Collection Time: 06/21/18 12:04 PM  Result Value Ref Range   ABO/RH(D) A POS    Antibody Screen NEG    Sample Expiration 06/24/2018    Unit Number [id number]    Blood Component Type RED CELLS,LR    Unit division 00    Status of Unit ISSUED    Transfusion Status OK TO TRANSFUSE    Crossmatch Result      Compatible Performed at [facility] [address], [place], [place] [address]   ABO/Rh     Status: None (Preliminary result)   Collection Time: 06/21/18 12:04 PM  Result Value Ref  Range   ABO/RH(D)      A POS Performed at [facility], [place] [address] [place], [place] [address]   CBC with Differential/Platelet     Status: Abnormal   Collection Time: 06/21/18 12:07 PM  Result Value Ref Range   WBC 44.4 (H) 4.0 - 10.5 K/uL   RBC 2.95 (L) 4.22 - 5.81 MIL/uL   Hemoglobin 7.9 (L) 13.0 - 17.0 g/dL   HCT 25.2 (L) 39.0 - 52.0 %   MCV 85.4 80.0 - 100.0 fL   MCH 26.8 26.0 - 34.0 pg   MCHC 31.3 30.0 - 36.0 g/dL   RDW 18.3 (H) 11.5 - 15.5 %   Platelets 233 150 - 400 K/uL   nRBC 0.0 0.0 - 0.2 %   Neutrophils Relative % 87 %   Neutro Abs 38.8 (H) 1.7 - 7.7 K/uL   Lymphocytes Relative 4 %   Lymphs Abs 1.6 0.7 - 4.0 K/uL   Monocytes Relative 5 %   Monocytes Absolute 2.1 (H) 0.1 - 1.0 K/uL   Eosinophils Relative 0 %   Eosinophils Absolute 0.0 0.0 - 0.5 K/uL   Basophils Relative 0 %   Basophils Absolute 0.1 0.0 - 0.1 K/uL   Immature Granulocytes 4 %   Abs Immature Granulocytes 1.75 (H) 0.00 - 0.07 K/uL   Polychromasia PRESENT     Comment: Performed at [facility], [place] [address], [place], [place] [address]  Comprehensive metabolic panel     Status: Abnormal   Collection Time: 06/21/18 12:07 PM  Result Value Ref Range   Sodium 125 (L) 135 - 145 mmol/L   Potassium 4.7 3.5 - 5.1 mmol/L   Chloride 89 (L) 98 - 111 mmol/L   CO2 26 22 - 32 mmol/L   Glucose, Bld 120 (H) 70 - 99 mg/dL   BUN 41 (H) 8 - 23 mg/dL   Creatinine, Ser 0.77 0.61 - 1.24 mg/dL   Calcium 9.7 8.9 - 10.3 mg/dL   Total Protein 6.4 (L) 6.5 - 8.1 g/dL   Albumin 3.0 (L) 3.5 - 5.0 g/dL   AST 20 15 - 41 U/L   ALT 15 0 - 44 U/L   Alkaline Phosphatase 91 38 - 126 U/L   Total Bilirubin 1.2 0.3 - 1.2 mg/dL   GFR calc non Af Amer >60 >60 mL/min   GFR calc Af Amer >60 >60 mL/min   Anion gap 10 5 - 15    Comment: Performed at [facility], [place] [address] [place], [place] [address]  Lipase,  blood     Status: None   Collection Time: 06/21/18 12:07 PM   Result Value Ref Range   Lipase 27 11 - 51 U/L    Comment: Performed at [facility], [place] [address], [place], [place] [address]  Protime-INR     Status: None   Collection Time: 06/21/18 12:07 PM  Result Value Ref Range   Prothrombin Time 14.5 11.4 - 15.2 seconds   INR 1.14     Comment: Performed at [facility], [place] [address] [place], [place] [address]  Prepare RBC     Status: None   Collection Time: 06/21/18 12:43 PM  Result Value Ref Range   Order Confirmation      ORDER PROCESSED BY BLOOD BANK Performed at [facility], [place] [address] [place], [place] [address]   Protime-INR     Status: None   Collection Time: 06/21/18  2:00 PM  Result Value Ref Range   Prothrombin Time 14.5 11.4 - 15.2 seconds   INR 1.14     Comment: Performed at [facility], [place] [address] [place], [place] [address]   Dg Chest 2 View  Result Date: 06/21/2018 CLINICAL DATA:  History of lung cancer.  Weight loss. EXAM: CHEST - 2 VIEW COMPARISON:  March 12, 2017 FINDINGS: The heart size is normal. Right central venous line is identified in the superior vena cava. There is right perihilar and hilar mass progressed compared to prior chest x-ray of 2018 consistent with patient's known lung cancer. The left lung is clear. Nipple shadow is identified in the left lung base. The visualized skeletal structures are stable. IMPRESSION: Right perihilar and hilar mass progressed compared to prior chest x-ray of 2018 consistent with patient's known lung cancer. No focal pneumonia is noted. Electronically Signed   By: Abelardo Diesel M.D.   On: 06/21/2018 10:27    Pending Labs Unresulted Labs (From admission, onward)    Start     Ordered   06/22/18 0500  CBC WITH DIFFERENTIAL  Daily,   R     06/21/18 1321   06/22/18 0500  Comprehensive metabolic panel  Tomorrow morning,   R     06/21/18 1321   06/21/18 1700  Hemoglobin and hematocrit, blood   Now then every 6 hours,   R     06/21/18 1317   06/21/18 1247  Blood culture (routine x 2)  BLOOD CULTURE X 2,   STAT     06/21/18 1246   06/21/18 1007  Urinalysis, Routine w reflex microscopic  Once,   R     06/21/18 1006          Vitals/Pain Today's Vitals   06/21/18 1300 06/21/18 1313 06/21/18 1330 06/21/18 1350  BP: 98/69  109/67   Pulse: 94  94   Resp: (!) 28  (!) 24   Temp:  97.6 F (36.4 C)  97.6 F (36.4 C)  TempSrc:  Axillary  Axillary  SpO2: 99%  100%   PainSc:        Isolation Precautions No active isolations  Medications Medications  0.9 %  sodium chloride infusion ( Intravenous Hold 06/21/18 1401)  ondansetron (ZOFRAN) tablet 4 mg (has no administration in time range)    Or  ondansetron (ZOFRAN) injection 4 mg (has no administration in time range)  acetaminophen (TYLENOL) tablet 650 mg (has no administration in time range)    Or  acetaminophen (TYLENOL) suppository 650 mg (has no administration  in time range)  pantoprazole (PROTONIX) injection 40 mg (has no administration in time range)  sodium chloride 0.9 % bolus 1,000 mL (0 mLs Intravenous Hold 06/21/18 1401)  0.9 %  sodium chloride infusion (Manually program via Guardrails IV Fluids) ( Intravenous New Bag/Given 06/21/18 1402)  pantoprazole (PROTONIX) injection 40 mg (40 mg Intravenous Given 06/21/18 1306)    Mobility non-ambulatory

## 2018-06-21 NOTE — ED Provider Notes (Signed)
Medical screening examination/treatment/procedure(s) were conducted as a shared visit with non-physician practitioner(s) and myself.  I personally evaluated the patient during the encounter. Briefly, the patient is a 73 y.o. male with history of lung cancer, CAD, diabetes who presents the ED with weakness.  Patient with unremarkable vitals.  No fever.  Patient with maroon color stool that was noticed by family.  Grossly seen on exam.  Concern for GI bleed.  With no cough, sputum production.  He is alert and oriented.  He states that he is on Plavix.  Currently he is not undergoing any cancer treatment but states that he does have an appointment with his oncologist next month to discuss further treatment.  Patient wants to be made DNR after discussion with myself.  He has no abdominal tenderness on exam.  Suspect that patient likely has GI bleed.  Possibly from a mass.  Labs were ordered.  Type and screen ordered.  Patient with a hemoglobin of 7.9 this is down about 4 points from 2 weeks ago.  Patient was given 1 unit of packed red blood cells.  Otherwise patient had leukocytosis of 44.  Had a leukocytosis of 24 outpatient 2 weeks ago.  He is on chronic steroids and immunotherapy.  No clear source for infection at this time.  Chest x-ray showed no obvious pneumonia.  Urinalysis is pending.  Patient to be admitted to hospitalist for further care.  GI to be consulted.  Hemodynamically stable throughout my care.  This chart was dictated using voice recognition software.  Despite best efforts to proofread,  errors can occur which can change the documentation meaning.    EKG Interpretation  Date/Time:  Friday June 21 2018 10:33:05 EST Ventricular Rate:  90 PR Interval:    QRS Duration: 91 QT Interval:  374 QTC Calculation: 458 R Axis:   25 Text Interpretation:  Sinus rhythm Borderline T wave abnormalities Confirmed by Lennice Sites [phone]) on 06/21/2018 1:19:56 PM          Lennice Sites,  DO 06/21/18 1321

## 2018-06-21 NOTE — ED Notes (Addendum)
Patient taken to radiology

## 2018-06-22 ENCOUNTER — Encounter (HOSPITAL_COMMUNITY)
Admission: EM | Disposition: A | Discharge status: Hospice | Payer: Self-pay | Source: Home / Self Care | Attending: Internal Medicine

## 2018-06-22 ENCOUNTER — Encounter (HOSPITAL_COMMUNITY): Payer: Self-pay

## 2018-06-22 ENCOUNTER — Other Ambulatory Visit: Payer: Self-pay | Admitting: Physician Assistant

## 2018-06-22 ENCOUNTER — Inpatient Hospital Stay (HOSPITAL_COMMUNITY): Payer: Medicare Other

## 2018-06-22 DIAGNOSIS — K922 Gastrointestinal hemorrhage, unspecified: Secondary | ICD-10-CM | POA: Diagnosis present

## 2018-06-22 DIAGNOSIS — L8990 Pressure ulcer of unspecified site, unspecified stage: Secondary | ICD-10-CM

## 2018-06-22 DIAGNOSIS — E441 Mild protein-calorie malnutrition: Secondary | ICD-10-CM

## 2018-06-22 DIAGNOSIS — K5903 Drug induced constipation: Secondary | ICD-10-CM

## 2018-06-22 DIAGNOSIS — E782 Mixed hyperlipidemia: Secondary | ICD-10-CM

## 2018-06-22 DIAGNOSIS — E871 Hypo-osmolality and hyponatremia: Secondary | ICD-10-CM

## 2018-06-22 DIAGNOSIS — Z7189 Other specified counseling: Secondary | ICD-10-CM

## 2018-06-22 DIAGNOSIS — R0602 Shortness of breath: Secondary | ICD-10-CM

## 2018-06-22 DIAGNOSIS — D72829 Elevated white blood cell count, unspecified: Secondary | ICD-10-CM

## 2018-06-22 DIAGNOSIS — T402X5A Adverse effect of other opioids, initial encounter: Secondary | ICD-10-CM

## 2018-06-22 DIAGNOSIS — Z515 Encounter for palliative care: Secondary | ICD-10-CM

## 2018-06-22 DIAGNOSIS — L89301 Pressure ulcer of unspecified buttock, stage 1: Secondary | ICD-10-CM

## 2018-06-22 DIAGNOSIS — I519 Heart disease, unspecified: Secondary | ICD-10-CM

## 2018-06-22 HISTORY — PX: FLEXIBLE SIGMOIDOSCOPY: SHX5431

## 2018-06-22 LAB — COMPREHENSIVE METABOLIC PANEL
ALT: 28 U/L (ref 0–44)
AST: 35 U/L (ref 15–41)
Albumin: 2.6 g/dL — ABNORMAL LOW (ref 3.5–5.0)
Alkaline Phosphatase: 82 U/L (ref 38–126)
Anion gap: 10 (ref 5–15)
BUN: 55 mg/dL — ABNORMAL HIGH (ref 8–23)
CO2: 22 mmol/L (ref 22–32)
Calcium: 9.1 mg/dL (ref 8.9–10.3)
Chloride: 96 mmol/L — ABNORMAL LOW (ref 98–111)
Creatinine, Ser: 0.97 mg/dL (ref 0.61–1.24)
GFR calc Af Amer: >60 mL/min (ref >60)
GFR calc non Af Amer: >60 mL/min (ref >60)
Glucose, Bld: 168 mg/dL — ABNORMAL HIGH (ref 70–99)
Potassium: 4.9 mmol/L (ref 3.5–5.1)
Sodium: 128 mmol/L — ABNORMAL LOW (ref 135–145)
Total Bilirubin: 0.7 mg/dL (ref 0.3–1.2)
Total Protein: 5.8 g/dL — ABNORMAL LOW (ref 6.5–8.1)

## 2018-06-22 LAB — CBC WITH DIFFERENTIAL/PLATELET
Abs Immature Granulocytes: 1.55 10*3/uL — ABNORMAL HIGH (ref 0.00–0.07)
BASOS PCT: 0 %
Basophils Absolute: 0.1 10*3/uL (ref 0.0–0.1)
Eosinophils Absolute: 0 10*3/uL (ref 0.0–0.5)
Eosinophils Relative: 0 %
HCT: 22.5 % — ABNORMAL LOW (ref 39.0–52.0)
Hemoglobin: 7.2 g/dL — ABNORMAL LOW (ref 13.0–17.0)
Immature Granulocytes: 4 %
Lymphocytes Relative: 6 %
Lymphs Abs: 2.1 10*3/uL (ref 0.7–4.0)
MCH: 27.7 pg (ref 26.0–34.0)
MCHC: 32 g/dL (ref 30.0–36.0)
MCV: 86.5 fL (ref 80.0–100.0)
MONOS PCT: 5 %
Monocytes Absolute: 1.9 10*3/uL — ABNORMAL HIGH (ref 0.1–1.0)
Neutro Abs: 31.9 10*3/uL — ABNORMAL HIGH (ref 1.7–7.7)
Neutrophils Relative %: 85 %
Platelets: 195 10*3/uL (ref 150–400)
RBC: 2.6 MIL/uL — ABNORMAL LOW (ref 4.22–5.81)
RDW: 17.9 % — ABNORMAL HIGH (ref 11.5–15.5)
WBC: 37.6 10*3/uL — ABNORMAL HIGH (ref 4.0–10.5)
nRBC: 0 % (ref 0.0–0.2)

## 2018-06-22 LAB — PREPARE RBC (CROSSMATCH)

## 2018-06-22 LAB — MRSA PCR SCREENING: MRSA by PCR: NEGATIVE

## 2018-06-22 LAB — GLUCOSE, CAPILLARY
Glucose-Capillary: 181 mg/dL — ABNORMAL HIGH (ref 70–99)
Glucose-Capillary: 183 mg/dL — ABNORMAL HIGH (ref 70–99)

## 2018-06-22 LAB — HEMOGLOBIN AND HEMATOCRIT, BLOOD
HCT: 29.2 % — ABNORMAL LOW (ref 39.0–52.0)
Hemoglobin: 9.5 g/dL — ABNORMAL LOW (ref 13.0–17.0)

## 2018-06-22 SURGERY — SIGMOIDOSCOPY, FLEXIBLE
Anesthesia: Moderate Sedation

## 2018-06-22 MED ORDER — SODIUM CHLORIDE 0.9 % IV SOLN: INTRAVENOUS | Status: DC

## 2018-06-22 MED ORDER — FUROSEMIDE 10 MG/ML IJ SOLN
20.0000 mg | Freq: Once | INTRAMUSCULAR | Status: AC
  Administered 2018-06-22: 20 mg via INTRAVENOUS
  Filled 2018-06-22: qty 2

## 2018-06-22 MED ORDER — CHLORHEXIDINE GLUCONATE 0.12 % MT SOLN
15.0000 mL | Freq: Two times a day (BID) | OROMUCOSAL | Status: DC
  Administered 2018-06-23 – 2018-07-02 (×17): 15 mL via OROMUCOSAL
  Filled 2018-06-22 (×17): qty 15

## 2018-06-22 MED ORDER — ORAL CARE MOUTH RINSE
15.0000 mL | Freq: Two times a day (BID) | OROMUCOSAL | Status: DC
  Administered 2018-06-22 – 2018-07-02 (×13): 15 mL via OROMUCOSAL

## 2018-06-22 MED ORDER — DIPHENHYDRAMINE HCL 25 MG PO CAPS
25.0000 mg | ORAL_CAPSULE | Freq: Once | ORAL | Status: AC
  Administered 2018-06-22: 25 mg via ORAL
  Filled 2018-06-22: qty 1

## 2018-06-22 MED ORDER — LIP MEDEX EX OINT
TOPICAL_OINTMENT | CUTANEOUS | Status: AC
  Administered 2018-06-22: 18:00:00
  Filled 2018-06-22: qty 7

## 2018-06-22 MED ORDER — SODIUM CHLORIDE 0.9% IV SOLUTION
Freq: Once | INTRAVENOUS | Status: AC
  Administered 2018-06-22: 10:00:00 via INTRAVENOUS

## 2018-06-22 MED ORDER — ACETAMINOPHEN 325 MG PO TABS: 650.0000 mg | ORAL_TABLET | Freq: Once | ORAL | Status: DC

## 2018-06-22 MED ORDER — MIDAZOLAM HCL (PF) 5 MG/ML IJ SOLN
INTRAMUSCULAR | Status: AC
  Filled 2018-06-22: qty 2

## 2018-06-22 MED ORDER — FENTANYL CITRATE (PF) 100 MCG/2ML IJ SOLN
INTRAMUSCULAR | Status: AC
  Filled 2018-06-22: qty 2

## 2018-06-22 NOTE — Consult Note (Signed)
Consultation Note Date: 06/22/2018   Patient Name: Randy [name]  DOB: Mar 11, 1946  MRN: [id number]  Age / Sex: 73 y.o., male  PCP: [name], PA-C Referring Physician: Eugenie Filler, MD  Reason for Consultation: Establishing goals of care  HPI/Patient Profile: 73 y.o. 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  Patient with a history of constipation worsened recently, admitted with GI bleed and acute blood loss anemia. Patient currently in stepdown unit at [facility]. Palliative medicine consultative for goals of care discussions.  Patient appears frail and weak. He is resting in bed. He is awake alert oriented. His wife is present at the bedside. I introduced myself and palliative care as follows: Palliative medicine is specialized medical care for people living with serious illness. It focuses on providing relief from the symptoms and stress of a serious illness. The goal is to improve quality of life for both the patient and the family.  Goals wishes and values attempted to be elicited. Discussed frankly about multiple serious life limiting conditions that the patient is facing. He has advanced stages of malignancy. He has ongoing GI bleed. He had 7 bloody movements overnight and his hemoglobin is 7.2 g/dL. He does feel mild shortness of breath even at rest.  Patient was seen alongside his hospital medicine physician at bedside this morning. GI is following. The patient likely to undergo further follow-up from a GI standpoint. He is also to get blood transfusions.  We discussed extensively about his current  condition. Patient's goals are not comfort measures only. He wishes for any reasonable interventions to be employed that will help with his GI bleeding. He understands that his lung cancer is very advanced. We discussed about CODE STATUS in detail. Differences between full code versus DO NOT RESUSCITATE/DO NOT INTUBATE discussed in detail. Discussed about what happens in a full resuscitation events. Patient clearly voiced multiple times that if his heart stopped or if he stopped breathing you would not want aggressive measures or heroic means.  See below.  NEXT OF KIN  1. Patient's wife Beatris Ship [phone]. They've been married for 10 years. Patient has 7 children. He has a daughter Morey Hummingbird [phone] who lives locally and is also involved in his healthcare.   SUMMARY OF RECOMMENDATIONS    Extensive discussions about CODE STATUS with the patient in great detail, his wife present at the bedside. Subsequently also discussed with daughter Coralyn Mark over the phone. Patient is awake alert oriented and elects for DO NOT RESUSCITATE/DO NOT INTUBATE. Continue current mode of care. GI specialist following. Patient to get adequate blood cell transfusions. Continue any and all efforts, reasonable procedures to help diagnose and treat GI bleeding. Palliative medicine team to continue to follow along. Code Status/Advance Care Planning:  DNR    Symptom Management:  as above  Palliative Prophylaxis:   Delirium Protocol   Psycho-social/Spiritual:   Desire for further Chaplaincy support:yes  Additional Recommendations: Caregiving  Support/Resources  Prognosis:   Unable to determine  Discharge Planning: To Be Determined      Primary Diagnoses: Present on Admission: . Hematochezia . Chronic obstructive pulmonary disease ([place]) . Coronary artery disease . Grade II diastolic dysfunction . Hyperlipidemia . Stage IV squamous cell carcinoma of right lung ([place]) . Hyponatremia . Leukocytosis .  GI bleed . Mild protein-calorie malnutrition ([place]) . Therapeutic opioid induced constipation   I have reviewed the medical record, interviewed the patient and family, and examined the patient. The following aspects are pertinent.  Past Medical History:  Diagnosis Date  . 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 Repeat US due 2023  . Carotid arterial disease ([place])    a. S/p R CEA;  b. 05/2015 Carotid U/S: RICA 2-70%, LICA 35-00%.  . Coronary artery disease    a. 2012 MI in [place] w/ PCI  //  b. 04/2013 MV: inflat scar, no ischemia.  //  c.   NSTEMI 5/17 >> Promus DES to LCx and POBA to OM1 (ISR).   . Diabetes mellitus without complication ([place])   . Grade II diastolic dysfunction 93/81/8299  . History of cardiac catheterization 10/21/15   a. NSTEMI 5/17: oLAD 40, pLCx 90 (Promus DES), OM2 90 ISR (POBA), oRCA 25   . History of echocardiogram    a. Mild LVH, EF 50-55%, possible inferolateral HK, grade 2 diastolic dysfunction, trivial AI, MAC  . History of nuclear stress test 04/2013   a. Myoview 12/14: Intermediate risk, prior inferolateral MI, no ischemia, EF 45%  . Hx of leukocytosis   . Hyperlipidemia   . Hypertension   . Hypertensive heart disease   . Morbid obesity ([place])   . Tobacco abuse    Social History   Socioeconomic History  . Marital status: Married    Spouse name: ardith  . Number of children: 7  . Years of education: college  . Highest education level: Not on file  Occupational History  . Occupation: retired  Scientific laboratory technician  . Financial resource strain: Not on file  . Food insecurity:    Worry: Not on file    Inability: Not on file  . Transportation needs:    Medical: Not on file    Non-medical: Not on file  Tobacco Use  . Smoking status: Former Smoker    Packs/day: 0.75    Years: 40.00    Pack years: 30.00    Types: Cigarettes    Start date: 06/18/1972    Last attempt to quit: 10/24/2015    Years since quitting: 2.6  . Smokeless tobacco: Never  Used  Substance and Sexual Activity  . Alcohol use: No    Alcohol/week: 0.0 standard drinks  . Drug use: No  . Sexual activity: Not Currently  Lifestyle  . Physical activity:    Days per week: Not on file    Minutes per session: Not on file  . Stress: Not on file  Relationships  . Social connections:    Talks on phone: Not on file    Gets together: Not on file    Attends religious service: Not on file    Active member of club or organization: Not on file    Attends meetings of clubs or organizations: Not on file    Relationship status: Not on file  Other Topics Concern  .  Not on file  Social History Narrative   Lives in [place] with wife.  Does not routinely exercise.  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   Family History  Problem Relation Age of Onset  . Other Mother        died @ 62 - complications following surgery.  . Hypertension Father        died in MVA @ 45   Scheduled Meds: . acetaminophen  650 mg Oral Once  . atorvastatin  40 mg Oral Daily  . [START ON 06/23/2018] chlorhexidine  15 mL Mouth Rinse BID  . Chlorhexidine Gluconate Cloth  6 each Topical Daily  . erythromycin  1 application Right Eye QHS  . furosemide  20 mg Intravenous Once  . gatifloxacin  1 drop Right Eye QID  . hydrocortisone  25 mg Rectal BID  . mouth rinse  15 mL Mouth Rinse q12n4p  . naloxegol oxalate  25 mg Oral Daily  . pantoprazole (PROTONIX) IV  40 mg Intravenous Q24H  . polyethylene glycol  17 g Oral BID  . polyvinyl alcohol  1 drop Both Eyes QID  . predniSONE  5 mg Oral BID WC  . sodium chloride flush  10-40 mL Intracatheter Q12H   Continuous Infusions: . sodium chloride 75 mL/hr at 06/22/18 1208   PRN Meds:.acetaminophen **OR** acetaminophen, chlorpheniramine-HYDROcodone, HYDROcodone-acetaminophen, ondansetron **OR** ondansetron (ZOFRAN) IV, sodium chloride flush, traZODone Medications Prior to Admission:  Prior to Admission medications     Medication Sig Start Date End Date Taking? Authorizing Provider  acetaminophen (TYLENOL) 500 MG tablet Take 1,000 mg by mouth every 6 (six) hours as needed for moderate pain.   Yes [provider]  aspirin EC 81 MG tablet Take 81 mg by mouth daily.   Yes [provider]  atorvastatin (LIPITOR) 40 MG tablet TAKE 1 TABLET BY MOUTH EVERY DAY Patient taking differently: Take 40 mg by mouth daily.  06/17/18  Yes Fay Records, MD  Carboxymethylcellulose Sodium (THERATEARS) 0.25 % SOLN Place 1 drop into both eyes 4 (four) times daily.    Yes [provider]  chlorpheniramine-HYDROcodone (TUSSIONEX PENNKINETIC ER) 10-8 MG/5ML SUER Take 5 mLs by mouth at bedtime as needed for cough.   Yes [provider]  erythromycin ophthalmic ointment Place 1 application into the right eye at bedtime. 06/20/18  Yes [provider]  HYDROcodone-acetaminophen (NORCO/VICODIN) 5-325 MG tablet Take 1 tablet by mouth every 6 (six) hours as needed for moderate pain.  05/20/18  Yes [provider]  metoprolol succinate (TOPROL-XL) 25 MG 24 hr tablet Take 2 tablets (50 mg total) by mouth 2 (two) times daily. 05/06/18  Yes Fay Records, MD  moxifloxacin (VIGAMOX) 0.5 % ophthalmic solution Place 1 drop into the right eye 4 (four) times daily. 06/19/18  Yes [provider]  naloxegol oxalate (MOVANTIK) 25 MG TABS tablet Take 1 tablet (25 mg total) by mouth daily after breakfast. Take 1h before or 2h after meal 05/28/18  Yes Cummings, Elson Areas, PA-C  predniSONE (DELTASONE) 10 MG tablet Take 0.5 tablets (5 mg total) by mouth 2 (two) times daily with a meal. 06/10/18  Yes Curt Bears, MD  Probiotic Product (PROBIOTIC PO) Take 1 capsule by mouth daily.   Yes [provider]  traZODone (DESYREL) 50 MG tablet Take 50 mg by mouth at bedtime as needed for sleep.  06/20/18  Yes [provider]  AMBULATORY NON [facility]  accessories Diagnoses Stage IV squamous cell carcinoma of right lung - C34.91, Debility R53.81 06/12/18   [name], PA-C  mirtazapine (REMERON) 30 MG tablet Take 1 tablet (30 mg total) by mouth at bedtime. Patient not taking: Reported on 06/21/2018 06/10/18   Curt Bears, MD   Allergies  Allergen Reactions  . Brilinta [Ticagrelor] Shortness Of Breath   Review of Systems  +some shortness of breath  Physical Exam Frail, elderly gentleman resting in bed Patient appears cachectic and pale Patient has temporal wasting Patient appears chronically ill Shallow clear breath sounds S1-S2 Abdomen is soft non tender Both lower extremities are cool to touch and somewhat dusky appearing. Patient is awake alert answers all questions appropriately  Vital Signs: BP 95/65   Pulse 94   Temp (!) 97.5 F (36.4 C)   Resp (!) 28   Ht _0  (1.803 m)   Wt 64.7 kg   SpO2 100%   BMI 19.89 kg/m  Pain Scale: 0-10   Pain Score: 0-No pain   SpO2: SpO2: 100 % O2 Device:SpO2: 100 % O2 Flow Rate: .O2 Flow Rate (L/min): 3 L/min  IO: Intake/output summary:   Intake/Output Summary (Last 24 hours) at 06/22/2018 1357 Last data filed at 06/22/2018 0300 Gross per 24 hour  Intake 1078 ml  Output -  Net 1078 ml    LBM: Last BM Date: 06/22/18 Baseline Weight: Weight: 64.7 kg Most recent weight: Weight: 64.7 kg     Palliative Assessment/Data:   PPS 30%  Time In: 11 Time Out: 12.10  Time Total:  70 min  Greater than 50%  of this time was spent counseling and coordinating care related to the above assessment and plan.  Signed by: [name], MD  [phone] Please contact Palliative Medicine Team phone at [phone] for questions and concerns.  For individual provider: See Shea Evans

## 2018-06-22 NOTE — Progress Notes (Addendum)
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  Patient on admission noted to have a hemoglobin of 7.9 from 11.9 on 06/10/2018.  Patient transfused a unit of packed red blood cells however had ongoing rectal bleeding the night of 06/21/2018 with 7 episodes of bloody bowel movements with hemoglobin dropping down to 7.2 despite a unit of packed red blood cell transfusion.  GI was consulted and felt that patient's anemia/bleed could likely be multifactorial secondary to internal hemorrhoid hemorrhage versus rectal stercoral ulcer.  Patient to be transfused 2 more units of packed red blood cells.  GI to reassess to see if patient is a candidate for any intervention/colonoscopy.   Assessment & Plan:   Principal Problem:   GI bleed Active Problems:   Hematochezia   Hyperlipidemia   Coronary artery disease   Leukocytosis   Diabetes mellitus without complication (HCC)   Chronic obstructive pulmonary disease (HCC)   Grade II diastolic dysfunction   Mild protein-calorie malnutrition (HCC)   Stage IV squamous cell carcinoma of right lung (HCC)   Therapeutic opioid induced constipation   Hyponatremia   Pressure injury of skin  1 GIB/hematochezia Questionable etiology.  Patient noted to have history of chronic constipation likely opioid induced on Movantik and as needed MiraLAX at home.  Prior to admission patient noted to have  significant constipation 1 week prior to admission with concerns for impaction as patient had stool the size of a baseball per patient.  Concern for hemorrhoidal bleed versus steroid coral ulcer.  Patient with ongoing GI bleed with 7 bloody bowel movements overnight.  On admission hemoglobin was 7.9 and patient transfused a unit packed red blood cells.  Hemoglobin went up to 9.0 then down to 8.3 and this morning is 7.2.  Anemia panel was not obtained on admission and likely not valuable at this time posttransfusion.  We will transfuse 2 units packed red blood cells.  GI recommended continuation of Movantik, MiraLAX twice daily and patient placed on rectal hydrocortisone suppositories twice daily.  Will make patient n.p.o. for now until patient has been reassessed by GI this morning.  Hold Plavix.  Transfusion threshold hemoglobin less than 8.  Follow H&H.  2.  Symptomatic anemia/acute blood loss anemia Secondary to problem #1.  Patient status post transfusion of 1 unit packed red blood cells.  Hemoglobin currently at 7.2 from 7.9 on admission.  Patient with ongoing GI bleed.  Transfuse 2 more units packed red blood cells.  GI following.  3.  Hypothermia Questionable etiology.  Chest x-ray unremarkable.  Urinalysis unremarkable.  Blood cultures pending.  With recent TSH of 2.0 on 06/10/2018.  Hypothermia improving with a temperature of 97.7 this morning.  Follow.  Retail banker as needed.  4.  Diabetes mellitus type 2 Last hemoglobin A1c was 6.7 on 03/29/2018.  Check CBGs every 4 hours.  5.  Hyponatremia Likely secondary to hypovolemic hyponatremia.  Patient presented with GI bleed.  Patient noted to have blood pressure drop as low as 78/57.  Patient noted to be tachycardic.  Continue IV fluids.  Patient to receive transfusion of packed red blood cells.  Follow.  6.  Stage IV squamous cell carcinoma of the right lung Patient with progressive disease.  Patient status post concurrent chemoradiation therapy  in the past, consolidation immunotherapy, status post clinical trial with Tisotumab Vedotin at [facility] early June 2019 which was subsequently discontinued secondary to disease progression.  Patient status post trial of single agent of gemcitabine for 1 dose which was also discontinued secondary to deterioration of performance status.  Patient at that time was advised to consider palliative care and hospice but patient declined this option and looking for more treatment if possible.  Patient being followed by Dr. Lorna Few who last saw patient 06/10/2018 and felt that due to his poor performance status patient may not be a good candidate for any treatment with his current status and recommended patient to work on his nutrition for the next few weeks.  Patient subsequently started on prednisone 10 mg twice daily in addition to Remeron and patient referred to dietitian at [place] for nutritional consultation.  Patient with a poor prognosis.  Palliative care assessing patient.  Outpatient follow-up with his oncologist Dr. Lorna Few.  7.  Dusky feet/bluish discoloration of toes Some concern for ischemic changes.  Patient likely with poor perfusion due to significant GI bleed.  Dopplers to assess pulses.  8.  Constipation Continue Movantik and MiraLAX twice daily.  9.  Leukocytosis Likely secondary to steroids as patient on prednisone 5 mg twice daily.  Patient noted to be hypo-thermic.  Urinalysis unremarkable.  Chest x-ray unremarkable.  Blood cultures ordered and are pending.  No need for antibiotics at this time.  Follow.  10.  Hyperlipidemia Continue statin.  11.  Coronary artery disease Patient with no chest pain at this moment.  Continue to hold aspirin, Plavix and metoprolol due to acute GI bleed and hypotension.  12.  Hypotension Secondary to problem #1.  13.  Grade 2 diastolic dysfunction Compensated.  Monitor closely for volume overload with blood  transfusions.  Beta-blocker on hold due to hypotension.  Aspirin and Plavix on hold.  Continue statin.  14 severe protein calorie malnutrition Likely secondary to progressive stage IV lung cancer.  15. Stage 1 pressure injury to coccyx Wound care..  16.  Shortness of breath Likely secondary to GI bleed/symptomatic anemia.  Repeat chest x-ray this morning.    DVT prophylaxis: SCDs Code Status: Partial Family Communication: Updated patient, wife, sister-in-law at bedside. Disposition Plan: Remain in stepdown unit.  To be determined.   Consultants:   Gastroenterology: Dr. Silverio Decamp 06/21/2018  Palliative care: Dr.Anwar 06/22/2018  Procedures:   Chest x-ray 06/21/2018  1 unit packed red blood cells 06/21/2018  Antimicrobials:   None   Subjective: Patient laying in bed.  Some complaints of some shortness of breath.  Patient noted to have 7 bloody bowel movements overnight per RN.  Patient denies any chest pain.  No nausea or emesis.  Objective: Vitals:   06/22/18 0500 06/22/18 0521 06/22/18 0600 06/22/18 0733  BP: (!) 86/62  99/64   Pulse: 67     Resp: (!) 27  (!) 26   Temp:    (!) 96.6 F (35.9 C)  TempSrc:    Oral  SpO2: (!) 83% 100%    Weight:      Height:        Intake/Output Summary (Last 24 hours) at 06/22/2018 1009 Last data filed  at 06/22/2018 0300 Gross per 24 hour  Intake 1078 ml  Output -  Net 1078 ml   Filed Weights   06/21/18 2000  Weight: 64.7 kg    Examination:  General exam: Cachectic.  Pale.  Temporal wasting.  Chronically ill-appearing. Respiratory system: Clear to auscultation anterior lung fields.  No wheezing, no crackles, no rhonchi. Respiratory effort normal. Cardiovascular system: Tachycardia. No JVD, murmurs, rubs, gallops or clicks.  1+ bilateral lower extremity edema.  Gastrointestinal system: Abdomen is scaphoid, nondistended, soft and nontender. No organomegaly or masses felt. Normal bowel sounds heard. Central nervous system:  Alert and oriented. No focal neurological deficits. Extremities: Feet are cool to touch, somewhat dusky, toes with some light blue discoloration.  Skin: No rashes, lesions or ulcers Psychiatry: Judgement and insight appear fair. Mood & affect appropriate.     Data Reviewed: I have personally reviewed following labs and imaging studies  CBC: Recent Labs  Lab 06/21/18 1207 06/21/18 1941 06/21/18 2316 06/22/18 0509  WBC 44.4*  --   --  37.6*  NEUTROABS 38.8*  --   --  31.9*  HGB 7.9* 9.0* 8.3* 7.2*  HCT 25.2* 28.1* 25.6* 22.5*  MCV 85.4  --   --  86.5  PLT 233  --   --  259   Basic Metabolic Panel: Recent Labs  Lab 06/21/18 1207 06/22/18 0509  NA 125* 128*  K 4.7 4.9  CL 89* 96*  CO2 26 22  GLUCOSE 120* 168*  BUN 41* 55*  CREATININE 0.77 0.97  CALCIUM 9.7 9.1   GFR: Estimated Creatinine Clearance: 63 mL/min (by C-G formula based on SCr of 0.97 mg/dL). Liver Function Tests: Recent Labs  Lab 06/21/18 1207 06/22/18 0509  AST 20 35  ALT 15 28  ALKPHOS 91 82  BILITOT 1.2 0.7  PROT 6.4* 5.8*  ALBUMIN 3.0* 2.6*   Recent Labs  Lab 06/21/18 1207  LIPASE 27   No results for input(s): AMMONIA in the last 168 hours. Coagulation Profile: Recent Labs  Lab 06/21/18 1207 06/21/18 1400  INR 1.14 1.14   Cardiac Enzymes: No results for input(s): CKTOTAL, CKMB, CKMBINDEX, TROPONINI in the last 168 hours. BNP (last 3 results) No results for input(s): PROBNP in the last 8760 hours. HbA1C: No results for input(s): HGBA1C in the last 72 hours. CBG: No results for input(s): GLUCAP in the last 168 hours. Lipid Profile: No results for input(s): CHOL, HDL, LDLCALC, TRIG, CHOLHDL, LDLDIRECT in the last 72 hours. Thyroid Function Tests: No results for input(s): TSH, T4TOTAL, FREET4, T3FREE, THYROIDAB in the last 72 hours. Anemia Panel: No results for input(s): VITAMINB12, FOLATE, FERRITIN, TIBC, IRON, RETICCTPCT in the last 72 hours. Sepsis Labs: No results for input(s):  PROCALCITON, LATICACIDVEN in the last 168 hours.  Recent Results (from the past 240 hour(s))  MRSA PCR Screening     Status: None   Collection Time: 06/21/18  9:14 PM  Result Value Ref Range Status   MRSA by PCR NEGATIVE NEGATIVE Final    Comment:        The GeneXpert MRSA Assay (FDA approved for NASAL specimens only), is one component of a comprehensive MRSA colonization surveillance program. It is not intended to diagnose MRSA infection nor to guide or monitor treatment for MRSA infections. Performed at [facility], [place] [address] [place], [place] [address]          Radiology Studies: Dg Chest 2 View  Result Date: 06/21/2018 CLINICAL DATA:  History of lung  cancer.  Weight loss. EXAM: CHEST - 2 VIEW COMPARISON:  March 12, 2017 FINDINGS: The heart size is normal. Right central venous line is identified in the superior vena cava. There is right perihilar and hilar mass progressed compared to prior chest x-ray of 2018 consistent with patient's known lung cancer. The left lung is clear. Nipple shadow is identified in the left lung base. The visualized skeletal structures are stable. IMPRESSION: Right perihilar and hilar mass progressed compared to prior chest x-ray of 2018 consistent with patient's known lung cancer. No focal pneumonia is noted. Electronically Signed   By: Abelardo Diesel M.D.   On: 06/21/2018 10:27        Scheduled Meds: . sodium chloride   Intravenous Once  . acetaminophen  650 mg Oral Once  . atorvastatin  40 mg Oral Daily  . [START ON 06/23/2018] chlorhexidine  15 mL Mouth Rinse BID  . Chlorhexidine Gluconate Cloth  6 each Topical Daily  . erythromycin  1 application Right Eye QHS  . furosemide  20 mg Intravenous Once  . gatifloxacin  1 drop Right Eye QID  . hydrocortisone  25 mg Rectal BID  . mouth rinse  15 mL Mouth Rinse q12n4p  . naloxegol oxalate  25 mg Oral Daily  . pantoprazole (PROTONIX) IV  40 mg Intravenous Q24H  .  polyethylene glycol  17 g Oral BID  . polyvinyl alcohol  1 drop Both Eyes QID  . predniSONE  5 mg Oral BID WC  . sodium chloride flush  10-40 mL Intracatheter Q12H   Continuous Infusions: . sodium chloride 75 mL/hr at 06/22/18 0300     LOS: 1 day    Time spent: 40 minutes    Irine Seal, MD [facility]  If 7PM-7AM, please contact night-coverage [email or web address] 06/22/2018, 10:09 AM

## 2018-06-22 NOTE — Interval H&P Note (Signed)
History and Physical Interval Note:  06/22/2018 12:34 PM  Randy [name]  has presented today for surgery, with the diagnosis of Hematochezia  The various methods of treatment have been discussed with the patient and family. After consideration of risks, benefits and other options for treatment, the patient has consented to  Procedure(s): FLEXIBLE SIGMOIDOSCOPY (N/A) as a surgical intervention .  The patient's history has been reviewed, patient examined, no change in status, stable for surgery.  I have reviewed the patient's chart and labs.  Questions were answered to the patient's satisfaction.     Brenin Heidelberger D

## 2018-06-22 NOTE — Plan of Care (Signed)
  Problem: Education: Goal: Knowledge of General Education information will improve Description Including pain rating scale, medication(s)/side effects and non-pharmacologic comfort measures Outcome: Progressing   

## 2018-06-22 NOTE — Op Note (Signed)
[facility] Patient Name: Randy [name] Procedure Date: 06/22/2018 MRN: [id number] Attending MD: Carol Ada , MD Date of Birth: 1945-09-06 CSN: [id number] Age: 73 Admit Type: Inpatient Procedure:                Flexible Sigmoidoscopy Indications:              Hematochezia Providers:                Carol Ada, MD, Carlyn Reichert, RN, Elspeth Cho                            Tech., Technician Referring MD:              Medicines:                None Complications:            No immediate complications. Estimated Blood Loss:     Estimated blood loss: none. Procedure:                Pre-Anesthesia Assessment:                           - Prior to the procedure, a History and Physical                            was performed, and patient medications and                            allergies were reviewed. The patient's tolerance of                            previous anesthesia was also reviewed. The risks                            and benefits of the procedure and the sedation                            options and risks were discussed with the patient.                            All questions were answered, and informed consent                            was obtained. Prior Anticoagulants: The patient has                            taken no previous anticoagulant or antiplatelet                            agents. ASA Grade Assessment: III - A patient with                            severe systemic disease. After reviewing the risks                            and benefits, the patient  was deemed in                            satisfactory condition to undergo the procedure.                           After obtaining informed consent, the scope was                            passed under direct vision. The GIF-[id number] ([id number])                            Olympus gastroscope was introduced through the anus                            and advanced to the the sigmoid colon. The  flexible                            sigmoidoscopy was accomplished without difficulty.                            The patient tolerated the procedure well. The                            quality of the bowel preparation was adequate. Scope In: 76:73:41 PM Scope Out: 12:56:07 PM Scope Withdrawal Time: 0 hours 1 minute 43 seconds  Total Procedure Duration: 0 hours 9 minutes 50 seconds  Findings:      Scattered small and large-mouthed diverticula were found in the sigmoid       colon.      Red blood was found in the rectum, in the recto-sigmoid colon and in the       sigmoid colon.      There was clear evidence of sigmoid diverticula and the presumption is       that the patient has a diverticular bleed. The rectum was negative for       any evidence of stercoral ulcers. Impression:               - Diverticulosis in the sigmoid colon.                           - Blood in the rectum, in the recto-sigmoid colon                            and in the sigmoid colon.                           - No specimens collected. Moderate Sedation:      None Recommendation:           - Return patient to hospital ward for ongoing care.                           - Resume regular diet.                           -  Follow HGB and transfuse as necessary. Procedure Code(s):        --- Professional ---                           [id number], Sigmoidoscopy, flexible; diagnostic,                            including collection of specimen(s) by brushing or                            washing, when performed (separate procedure) Diagnosis Code(s):        --- Professional ---                           K62.5, Hemorrhage of anus and rectum                           K92.2, Gastrointestinal hemorrhage, unspecified                           K92.1, Melena (includes Hematochezia)                           K57.30, Diverticulosis of large intestine without                            perforation or abscess without bleeding CPT  copyright 2018 [employer]. All rights reserved. The codes documented in this report are preliminary and upon coder review may  be revised to meet current compliance requirements. Carol Ada, MD Carol Ada, MD 06/22/2018 3:47:54 PM This report has been signed electronically. Number of Addenda: 0

## 2018-06-23 DIAGNOSIS — Z515 Encounter for palliative care: Secondary | ICD-10-CM

## 2018-06-23 DIAGNOSIS — E43 Unspecified severe protein-calorie malnutrition: Secondary | ICD-10-CM

## 2018-06-23 DIAGNOSIS — M79672 Pain in left foot: Secondary | ICD-10-CM

## 2018-06-23 DIAGNOSIS — M79671 Pain in right foot: Secondary | ICD-10-CM

## 2018-06-23 LAB — CBC WITH DIFFERENTIAL/PLATELET
ABS IMMATURE GRANULOCYTES: 1.89 10*3/uL — AB (ref 0.00–0.07)
Basophils Absolute: 0.2 10*3/uL — ABNORMAL HIGH (ref 0.0–0.1)
Basophils Relative: 0 %
Eosinophils Absolute: 0 10*3/uL (ref 0.0–0.5)
Eosinophils Relative: 0 %
HCT: 28.9 % — ABNORMAL LOW (ref 39.0–52.0)
Hemoglobin: 9.4 g/dL — ABNORMAL LOW (ref 13.0–17.0)
IMMATURE GRANULOCYTES: 4 %
Lymphocytes Relative: 3 %
Lymphs Abs: 1.6 10*3/uL (ref 0.7–4.0)
MCH: 28.3 pg (ref 26.0–34.0)
MCHC: 32.5 g/dL (ref 30.0–36.0)
MCV: 87 fL (ref 80.0–100.0)
Monocytes Absolute: 3 10*3/uL — ABNORMAL HIGH (ref 0.1–1.0)
Monocytes Relative: 6 %
Neutro Abs: 47 10*3/uL — ABNORMAL HIGH (ref 1.7–7.7)
Neutrophils Relative %: 87 %
Platelets: 152 10*3/uL (ref 150–400)
RBC: 3.32 MIL/uL — ABNORMAL LOW (ref 4.22–5.81)
RDW: 16.6 % — ABNORMAL HIGH (ref 11.5–15.5)
WBC: 53.7 10*3/uL (ref 4.0–10.5)
nRBC: 0 % (ref 0.0–0.2)

## 2018-06-23 LAB — PREALBUMIN: Prealbumin: 11 mg/dL — ABNORMAL LOW (ref 18–38)

## 2018-06-23 LAB — BASIC METABOLIC PANEL
ANION GAP: 10 (ref 5–15)
BUN: 63 mg/dL — AB (ref 8–23)
CO2: 21 mmol/L — ABNORMAL LOW (ref 22–32)
Calcium: 9.4 mg/dL (ref 8.9–10.3)
Chloride: 98 mmol/L (ref 98–111)
Creatinine, Ser: 0.97 mg/dL (ref 0.61–1.24)
GFR calc Af Amer: >60 mL/min (ref >60)
GFR calc non Af Amer: >60 mL/min (ref >60)
Glucose, Bld: 171 mg/dL — ABNORMAL HIGH (ref 70–99)
POTASSIUM: 4.4 mmol/L (ref 3.5–5.1)
Sodium: 129 mmol/L — ABNORMAL LOW (ref 135–145)

## 2018-06-23 LAB — HEMOGLOBIN AND HEMATOCRIT, BLOOD
HCT: 28.7 % — ABNORMAL LOW (ref 39.0–52.0)
Hemoglobin: 9.3 g/dL — ABNORMAL LOW (ref 13.0–17.0)

## 2018-06-23 LAB — MAGNESIUM: Magnesium: 1.9 mg/dL (ref 1.7–2.4)

## 2018-06-23 LAB — GLUCOSE, CAPILLARY
Glucose-Capillary: 157 mg/dL — ABNORMAL HIGH (ref 70–99)
Glucose-Capillary: 139 mg/dL — ABNORMAL HIGH (ref 70–99)
Glucose-Capillary: 173 mg/dL — ABNORMAL HIGH (ref 70–99)
Glucose-Capillary: 165 mg/dL — ABNORMAL HIGH (ref 70–99)
Glucose-Capillary: 191 mg/dL — ABNORMAL HIGH (ref 70–99)

## 2018-06-23 LAB — PHOSPHORUS: Phosphorus: 3.9 mg/dL (ref 2.5–4.6)

## 2018-06-23 MED ORDER — ALBUMIN HUMAN 25 % IV SOLN
25.0000 g | Freq: Once | INTRAVENOUS | Status: AC
  Administered 2018-06-23: 25 g via INTRAVENOUS
  Filled 2018-06-23: qty 50

## 2018-06-23 MED ORDER — ENSURE ENLIVE PO LIQD
237.0000 mL | Freq: Two times a day (BID) | ORAL | Status: DC
  Administered 2018-06-23 – 2018-07-02 (×16): 237 mL via ORAL

## 2018-06-23 MED ORDER — ALBUMIN HUMAN 25 % IV SOLN
INTRAVENOUS | Status: AC
  Filled 2018-06-23: qty 50

## 2018-06-23 NOTE — Progress Notes (Signed)
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  Patient on admission noted to have a hemoglobin of 7.9 from 11.9 on 06/10/2018.  Patient transfused a unit of packed red blood cells however had ongoing rectal bleeding the night of 06/21/2018 with 7 episodes of bloody bowel movements with hemoglobin dropping down to 7.2 despite a unit of packed red blood cell transfusion.  GI was consulted and felt that patient's anemia/bleed could likely be multifactorial secondary to internal hemorrhoid hemorrhage versus rectal stercoral ulcer.  Patient to be transfused 2 more units of packed red blood cells.  GI to reassess to see if patient is a candidate for any intervention/colonoscopy.   Assessment & Plan:   Principal Problem:   GI bleed Active Problems:   Hematochezia   Hyperlipidemia   Coronary artery disease   Leukocytosis   Diabetes mellitus without complication (HCC)   Chronic obstructive pulmonary disease (HCC)   Grade II diastolic dysfunction   Mild protein-calorie malnutrition (HCC)   Stage IV squamous cell carcinoma of right lung (HCC)   Therapeutic opioid induced constipation   Hyponatremia   Pressure injury of skin   SOB (shortness of breath)   Palliative care by specialist   Goals of care, counseling/discussion  1 GIB/hematochezia Questionable etiology.  Presumably felt may be secondary to diverticular bleed Paraflex  sigmoidoscopy done on 06/22/2018 per GI.   Patient noted to have history of chronic constipation likely opioid induced on Movantik and as needed MiraLAX at home.  Prior to admission patient noted to have significant constipation 1 week prior to admission with concerns for impaction as patient had stool the size of a baseball per patient.  Concern for hemorrhoidal bleed versus steroid coral ulcer.  Patient with ongoing GI bleed with 7 bloody bowel movements that night of 06/21/2018.  On admission hemoglobin was 7.9 and patient transfused a unit packed red blood cells.  Hemoglobin went up to 9.0 then down to 8.3 and then down to 7.2 the morning of 06/22/2018.  Patient transfused 2 more units of packed red blood cells and has received a total of 3 units of packed red blood cells during this hospitalization.  Hemoglobin currently at 9.4.  No further bleeding overnight per patient and per RN.   Anemia panel was not obtained on admission and likely not valuable at this time posttransfusion. GI initially on consultation, recommended continuation of Movantik, MiraLAX twice daily and patient placed on rectal hydrocortisone suppositories twice daily.  Due to patient's ongoing bleed on 06/22/2018 patient was reassessed by Dr. Geoffry Paradise of gastroenterology and patient underwent a flex sigmoidoscopy which did show diverticulosis.  It was felt patient may likely have a diverticular bleed.  Plavix and aspirin on hold.  Hemoglobin currently at 9.4.  Follow H&H. Transfusion threshold hemoglobin less than 8.  Follow H&H.  2.  Symptomatic anemia/acute blood loss anemia Secondary to problem #1.  Patient status post transfusion of 1 unit packed red blood cells on admission.  Patient transfused 2 units of packed  red blood cells on 06/22/2018 and has received a total of 3 units of packed red blood cells..  Hemoglobin currently at 9.4 from 9.5 from 7.2 from 7.9 on admission.  Patient noted to have ongoing GI bleed however no further bleeding  over night.  Status post flexible sigmoidoscopy per GI on 06/22/2018.  Repeat H&H this afternoon.  Transfusion threshold hemoglobin less than 8. GI following.  3.  Hypothermia Questionable etiology.  Chest x-ray unremarkable.  Urinalysis unremarkable.  Blood cultures pending.  With recent TSH of 2.0 on 06/10/2018.  Hypothermia improved with a temperature of 98.8 this morning.  Follow.  Retail banker as needed.  4.  Diabetes mellitus type 2 Last hemoglobin A1c was 6.7 on 03/29/2018.  CBG of 139 this morning.  Change CBGs to [facility] and at bedtime.    5.  Hyponatremia Likely secondary to hypovolemic hyponatremia.  Patient presented with GI bleed.  Patient noted to have blood pressure drop as low as 78/57.  Patient noted to be tachycardic.  Status post transfusion of packed red blood cells.  Sodium levels slowly improving.  Continue gentle hydration with IV fluids.  Follow.  6.  Stage IV squamous cell carcinoma of the right lung Patient with progressive disease.  Patient status post concurrent chemoradiation therapy in the past, consolidation immunotherapy, status post clinical trial with Tisotumab Vedotin at [facility] early June 2019 which was subsequently discontinued secondary to disease progression.  Patient status post trial of single agent of gemcitabine for 1 dose which was also discontinued secondary to deterioration of performance status.  Patient at that time was advised to consider palliative care and hospice but patient declined this option and looking for more treatment if possible.  Patient being followed by Dr. Lorna Few who last saw patient 06/10/2018 and felt that due to his poor performance status patient may not be a good candidate for any treatment with his current status and recommended patient to work on his nutrition for the next few weeks.  Patient subsequently started on prednisone 10 mg twice daily in addition to Remeron and patient referred to dietitian at [place]  for nutritional consultation.  Patient with a poor prognosis.  Palliative care following.  Outpatient follow-up with his oncologist Dr. Lorna Few.  7.  Dusky feet/bluish discoloration of toes concern for ischemic limbs Some concern for ischemic changes.  Patient likely with poor perfusion due to significant GI bleed.  Dopplers done and noted to have pulses on the right dorsalis pedis and tibialis and left tibialis.  Unable to obtain pulses on the left dorsalis pedis.  Patient presented with a GI bleed and as such Plavix and aspirin on hold and difficult to anticoagulate at this time.  Consult with vascular surgery for further evaluation and management.   8.  Constipation Continue Movantik and MiraLAX twice daily.  9.  Leukocytosis Likely secondary to steroids as patient on prednisone 5 mg twice daily and likely progressive lung cancer..  Patient noted to be hypo-thermic.  Urinalysis unremarkable.  Chest x-ray unremarkable.  Blood cultures ordered and are pending.  Leukocytosis worsening.  Patient afebrile.  No need for antibiotics at this time.  Follow.  10.  Hyperlipidemia Continue statin.  11.  Coronary artery disease Patient with no chest pain at this moment.  Continue to hold aspirin, Plavix and metoprolol due to acute GI bleed and hypotension.  12.  Hypotension Secondary to problem #1.  Improved with transfusion of packed red blood cells and saline.  Continue gentle hydration for the next 24 hours and then saline lock IV fluids.  13.  Grade 2 diastolic dysfunction Compensated.  Patient with some lower extremity edema.  Patient beta-blocker held on admission due to hypotension.  Aspirin and Plavix on hold due to presentation with acute GI bleed.  Continue statin.  Continue gentle hydration with normal saline at 75 cc/h.   14 severe protein calorie malnutrition Likely secondary to progressive stage IV lung cancer.  Check a prealbumin.  Place on Ensure.  May need IV albumin however  will hold off for now.  15. Stage 1 pressure injury to coccyx Wound care..  16.  Shortness of breath Likely secondary to GI bleed/symptomatic anemia and progressive lung cancer.  Repeat chest x-ray on 06/22/2018- for any pulmonary edema or infiltrate, similar progressive lung mass noted.  Will need to check ambulatory sats prior to discharge.  May need home O2.      DVT prophylaxis: SCDs Code Status: Partial Family Communication: Updated patient, and daughter and son at bedside.  Disposition Plan: Remain in stepdown unit.  To be determined.   Consultants:   Gastroenterology: Dr. Silverio Decamp 06/21/2018  Palliative care: Dr.Anwar 06/22/2018  Procedures:   Chest x-ray 06/21/2018, 06/22/2018  1 unit packed red blood cells 06/21/2018  2 units packed red blood cells 06/22/2018  Flexible sigmoidoscopy per [name] 06/22/2018  Antimicrobials:   None   Subjective: Patient in bed.  Per family ate breakfast most of this morning.  Patient denies any worsening shortness of breath with slight improvement from yesterday.  Denies any chest pain.  No further bloody bowel movements overnight per RN.  No nausea or emesis.   Objective: Vitals:   06/23/18 0200 06/23/18 0400 06/23/18 0600 06/23/18 0800  BP: 96/65 113/79 122/67   Pulse:  (!) 113 (!) 116   Resp: (!) 30 (!) 26 (!) 28   Temp:  97.8 F (36.6 C)  98.8 F (37.1 C)  TempSrc:  Axillary  Axillary  SpO2:  100% 100%   Weight:      Height:        Intake/Output Summary (Last 24 hours) at 06/23/2018 0910 Last data filed at 06/22/2018 1735 Gross per 24 hour  Intake 690 ml  Output 200 ml  Net 490 ml   Filed Weights   06/21/18 2000  Weight: 64.7 kg    Examination:  General exam: Cachectic.  Pale.  Temporal wasting.  Chronically ill-appearing. Respiratory system: CTAB. No wheezes, no crackles, no rhonchi. Respiratory effort normal. Cardiovascular system: Tachycardia.  No JVD, no murmurs rubs or gallops.  2+ bilateral lower extremity  edema. Gastrointestinal system: Abdomen is nondistended, soft, nontender, scaphoid.  Positive bowel sounds.  No organomegaly.  Central nervous system: Alert and oriented. No focal neurological deficits. Extremities: 1-2+ bilateral lower extremity edema.  Feet are cool to touch, somewhat dusky, toes with some light blue discoloration.  Skin: No rashes, lesions or ulcers Psychiatry: Judgement and insight appear fair. Mood & affect appropriate.     Data Reviewed: I have personally reviewed following labs and imaging studies  CBC: Recent Labs  Lab 06/21/18 1207 06/21/18 1941 06/21/18 2316 06/22/18 0509 06/22/18 1926 06/23/18 0314  WBC 44.4*  --   --  37.6*  --  53.7*  NEUTROABS 38.8*  --   --  31.9*  --  47.0*  HGB 7.9* 9.0* 8.3* 7.2* 9.5* 9.4*  HCT 25.2* 28.1* 25.6* 22.5* 29.2* 28.9*  MCV 85.4  --   --  86.5  --  87.0  PLT 233  --   --  195  --  027   Basic Metabolic Panel: Recent Labs  Lab 06/21/18 1207 06/22/18 0509 06/23/18 0314  NA 125* 128* 129*  K 4.7 4.9 4.4  CL 89* 96* 98  CO2 26 22 21*  GLUCOSE 120* 168* 171*  BUN 41* 55* 63*  CREATININE 0.77 0.97 0.97  CALCIUM 9.7 9.1 9.4  MG  --   --  1.9  PHOS  --   --  3.9   GFR: Estimated Creatinine Clearance: 63 mL/min (by C-G formula based on SCr of 0.97 mg/dL). Liver Function Tests: Recent Labs  Lab 06/21/18 1207 06/22/18 0509  AST 20 35  ALT 15 28  ALKPHOS 91 82  BILITOT 1.2 0.7  PROT 6.4* 5.8*  ALBUMIN 3.0* 2.6*   Recent Labs  Lab 06/21/18 1207  LIPASE 27   No results for input(s): AMMONIA in the last 168 hours. Coagulation Profile: Recent Labs  Lab 06/21/18 1207 06/21/18 1400  INR 1.14 1.14   Cardiac Enzymes: No results for input(s): CKTOTAL, CKMB, CKMBINDEX, TROPONINI in the last 168 hours. BNP (last 3 results) No results for input(s): PROBNP in the last 8760 hours. HbA1C: No results for input(s): HGBA1C in the last 72 hours. CBG: Recent Labs  Lab 06/22/18 1945 06/22/18 2346  06/23/18 0314 06/23/18 0737  GLUCAP 183* 181* 157* 139*   Lipid Profile: No results for input(s): CHOL, HDL, LDLCALC, TRIG, CHOLHDL, LDLDIRECT in the last 72 hours. Thyroid Function Tests: No results for input(s): TSH, T4TOTAL, FREET4, T3FREE, THYROIDAB in the last 72 hours. Anemia Panel: No results for input(s): VITAMINB12, FOLATE, FERRITIN, TIBC, IRON, RETICCTPCT in the last 72 hours. Sepsis Labs: No results for input(s): PROCALCITON, LATICACIDVEN in the last 168 hours.  Recent Results (from the past 240 hour(s))  Blood culture (routine x 2)     Status: None (Preliminary result)   Collection Time: 06/21/18  1:05 PM  Result Value Ref Range Status   Specimen Description   Final    BLOOD PORTA CATH Performed at [facility] [address] [place], [place] [address]    Special Requests   Final    BOTTLES DRAWN AEROBIC AND ANAEROBIC Blood Culture adequate volume Performed at [facility] [address] [place], [place] [address]    Culture   Final    NO GROWTH < 24 HOURS Performed at [facility] [address], [place], [place] [address]    Report Status PENDING  Incomplete  Blood culture (routine x 2)     Status: None (Preliminary result)   Collection Time: 06/21/18  1:05 PM  Result Value Ref Range Status   Specimen Description   Final    BLOOD PORT Performed at [facility] [address], [place], [place] [address]    Special Requests   Final    BOTTLES DRAWN AEROBIC AND ANAEROBIC Blood Culture adequate volume Performed at [facility] [address], [place], [place] [address]    Culture   Final    NO GROWTH < 24 HOURS Performed at [facility] [address], [place], [place] [address]    Report Status PENDING  Incomplete  MRSA PCR Screening     Status: None   Collection Time: 06/21/18  9:14 PM  Result Value Ref Range Status   MRSA by PCR NEGATIVE NEGATIVE Final     Comment:  The GeneXpert MRSA Assay (FDA approved for NASAL specimens only), is one component of a comprehensive MRSA colonization surveillance program. It is not intended to diagnose MRSA infection nor to guide or monitor treatment for MRSA infections. Performed at [facility], [place] [address], [place], [place] [address]          Radiology Studies: Dg Chest 2 View  Result Date: 06/21/2018 CLINICAL DATA:  History of lung cancer.  Weight loss. EXAM: CHEST - 2 VIEW COMPARISON:  March 12, 2017 FINDINGS: The heart size is normal. Right central venous line is identified in the superior vena cava. There is right perihilar and hilar mass progressed compared to prior chest x-ray of 2018 consistent with patient's known lung cancer. The left lung is clear. Nipple shadow is identified in the left lung base. The visualized skeletal structures are stable. IMPRESSION: Right perihilar and hilar mass progressed compared to prior chest x-ray of 2018 consistent with patient's known lung cancer. No focal pneumonia is noted. Electronically Signed   By: Abelardo Diesel M.D.   On: 06/21/2018 10:27   Dg Chest Port 1 View  Result Date: 06/22/2018 CLINICAL DATA:  Shortness of breath this morning. History of diabetes, lung cancer, hypertension and coronary artery disease. EXAM: PORTABLE CHEST 1 VIEW COMPARISON:  Chest x-ray dated 06/21/2018. FINDINGS: Heart size and mediastinal contours are stable. Stable RIGHT perihilar opacity, corresponding to patient's known history of lung cancer, better demonstrated on earlier chest CT of 10/19/2017. Stable volume loss on the RIGHT. LEFT lung remains clear. No pleural effusion or pneumothorax seen. RIGHT-sided Port-A-Cath is stable in position with tip overlying the lower SVC/cavoatrial junction. IMPRESSION: No change compared to yesterday's chest x-ray. Known RIGHT perihilar cancer. No evidence of pneumonia or pulmonary edema. Electronically Signed    By: Franki Cabot M.D.   On: 06/22/2018 10:32        Scheduled Meds: . acetaminophen  650 mg Oral Once  . atorvastatin  40 mg Oral Daily  . chlorhexidine  15 mL Mouth Rinse BID  . Chlorhexidine Gluconate Cloth  6 each Topical Daily  . erythromycin  1 application Right Eye QHS  . gatifloxacin  1 drop Right Eye QID  . hydrocortisone  25 mg Rectal BID  . mouth rinse  15 mL Mouth Rinse q12n4p  . naloxegol oxalate  25 mg Oral Daily  . pantoprazole (PROTONIX) IV  40 mg Intravenous Q24H  . polyethylene glycol  17 g Oral BID  . polyvinyl alcohol  1 drop Both Eyes QID  . predniSONE  5 mg Oral BID WC  . sodium chloride flush  10-40 mL Intracatheter Q12H   Continuous Infusions: . sodium chloride 75 mL/hr at 06/22/18 1900     LOS: 2 days    Time spent: 40 minutes    Irine Seal, MD [facility]  If 7PM-7AM, please contact night-coverage [email or web address] 06/23/2018, 9:10 AM

## 2018-06-23 NOTE — Progress Notes (Signed)
PMT progress note  Patient is awake alert, resting in bed, no more bowel movements with blood, wife and sister in law at bedside.   Some of his children have come and visited.   Patient underwent flexible sigmoidoscopy on 06-22-2018, presumed to have a diverticular bleed.   BP 122/67   Pulse (!) 116   Temp (!) 97.3 F (36.3 C) (Axillary)   Resp (!) 28   Ht 5\' 11"  (1.803 m)   Wt 64.7 kg   SpO2 100%   BMI 19.89 kg/m   Complains of shortness of breath, but overall, feels much better than yesterday.   Awake alert No distress Oriented Non focal Extremities some what more warm to touch, still with dusky appearance S 1 S 2  Regular breath sounds  PPS 30%  Labs and imaging noted Procedures noted  GIB  Lung cancer.   Remains partial code at this time. Patient states that he is awaiting the arrival of some more of his children.  He also wishes to have further discussions from a med onc perspective about his cancer.  Continue current mode of care.  No additional PMT specific interventions at this time.   25 minutes spent.  [name] MD [facility] palliative medicine team.

## 2018-06-23 NOTE — Consult Note (Signed)
Hospital Consult    Reason for Consult: Discoloration of toes Referring Physician: Dr. Grandville Silos MRN #:  [id number]  History of Present Illness: This is a 73 y.o. male with history of coronary artery stenting previously on Plavix.  He also has history of diabetes, hyperlipidemia, hypertension and history of smoking but quit 4 years ago.  Now has stage IV non-small cell lung cancer but he is not been strong enough for treatment.  Presented with rectal bleeding has been evaluated by GI.  Now tells me has not had further bleeding and has stabilized after transfusion.  Consult is for discoloration of toes.  States that he is swelling with discomfort in the bilateral lower extremities.  He has never had any lower extremity intervention.  He can feel and move his feet without issue.  Denies any previous history of DVT.  Past Medical History:  Diagnosis Date  . 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 Repeat US due 2023  . Carotid arterial disease ([place])    a. S/p R CEA;  b. 05/2015 Carotid U/S: RICA 3-79%, LICA 02-40%.  . Coronary artery disease    a. 2012 MI in [place] w/ PCI  //  b. 04/2013 MV: inflat scar, no ischemia.  //  c.   NSTEMI 5/17 >> Promus DES to LCx and POBA to OM1 (ISR).   . Diabetes mellitus without complication ([place])   . Grade II diastolic dysfunction 97/35/3299  . History of cardiac catheterization 10/21/15   a. NSTEMI 5/17: oLAD 40, pLCx 90 (Promus DES), OM2 90 ISR (POBA), oRCA 25   . History of echocardiogram    a. Mild LVH, EF 50-55%, possible inferolateral HK, grade 2 diastolic dysfunction, trivial AI, MAC  . History of nuclear stress test 04/2013   a. Myoview 12/14: Intermediate risk, prior inferolateral MI, no ischemia, EF 45%  . Hx of leukocytosis   . Hyperlipidemia   . Hypertension   . Hypertensive heart disease   . Morbid obesity ([place])   . Tobacco abuse     Past Surgical History:  Procedure Laterality Date  . CARDIAC CATHETERIZATION    . CARDIAC  CATHETERIZATION N/A 10/21/2015   Procedure: Left Heart Cath and Coronary Angiography;  Surgeon: [name], MD;  Location: [facility];  Service: Cardiovascular;  Laterality: N/A;  . CARDIAC CATHETERIZATION N/A 10/21/2015   Procedure: Coronary Stent Intervention;  Surgeon: [name], MD;  Location: [facility];  Service: Cardiovascular;  Laterality: N/A;  . CARDIAC CATHETERIZATION N/A 10/21/2015   Procedure: Coronary Balloon Angioplasty;  Surgeon: [name], MD;  Location: [facility];  Service: Cardiovascular;  Laterality: N/A;  . CAROTID ENDARTERECTOMY    . CORONARY ANGIOPLASTY    . CORONARY STENT PLACEMENT    . VIDEO BRONCHOSCOPY Bilateral 04/03/2017   Procedure: VIDEO BRONCHOSCOPY WITH FLUORO;  Surgeon: [name], MD;  Location: [facility];  Service: Cardiopulmonary;  Laterality: Bilateral;    Allergies  Allergen Reactions  . Brilinta [Ticagrelor] Shortness Of Breath    Prior to Admission medications   Medication Sig Start Date End Date Taking? Authorizing Provider  acetaminophen (TYLENOL) 500 MG tablet Take 1,000 mg by mouth every 6 (six) hours as needed for moderate pain.   Yes [provider]  aspirin EC 81 MG tablet Take 81 mg by mouth daily.   Yes [provider]  atorvastatin (LIPITOR) 40 MG tablet TAKE 1 TABLET BY MOUTH EVERY DAY Patient taking differently: Take  40 mg by mouth daily.  06/17/18  Yes Fay Records, MD  Carboxymethylcellulose Sodium (THERATEARS) 0.25 % SOLN Place 1 drop into both eyes 4 (four) times daily.    Yes [provider]  chlorpheniramine-HYDROcodone (TUSSIONEX PENNKINETIC ER) 10-8 MG/5ML SUER Take 5 mLs by mouth at bedtime as needed for cough.   Yes [provider]  erythromycin ophthalmic ointment Place 1 application into the right eye at bedtime. 06/20/18  Yes [provider]  HYDROcodone-acetaminophen (NORCO/VICODIN) 5-325 MG tablet Take 1 tablet by mouth  every 6 (six) hours as needed for moderate pain.  05/20/18  Yes [provider]  metoprolol succinate (TOPROL-XL) 25 MG 24 hr tablet Take 2 tablets (50 mg total) by mouth 2 (two) times daily. 05/06/18  Yes Fay Records, MD  moxifloxacin (VIGAMOX) 0.5 % ophthalmic solution Place 1 drop into the right eye 4 (four) times daily. 06/19/18  Yes [provider]  naloxegol oxalate (MOVANTIK) 25 MG TABS tablet Take 1 tablet (25 mg total) by mouth daily after breakfast. Take 1h before or 2h after meal 05/28/18  Yes Cummings, Elson Areas, PA-C  predniSONE (DELTASONE) 10 MG tablet Take 0.5 tablets (5 mg total) by mouth 2 (two) times daily with a meal. 06/10/18  Yes Curt Bears, MD  Probiotic Product (PROBIOTIC PO) Take 1 capsule by mouth daily.   Yes [provider]  traZODone (DESYREL) 50 MG tablet Take 50 mg by mouth at bedtime as needed for sleep.  06/20/18  Yes [provider]  AMBULATORY NON [employer] [employer] Diagnoses Stage IV squamous cell carcinoma of right lung - C34.91, Debility R53.81 06/12/18   [name], PA-C  mirtazapine (REMERON) 30 MG tablet Take 1 tablet (30 mg total) by mouth at bedtime. Patient not taking: Reported on 06/21/2018 06/10/18   Curt Bears, MD    Social History   Socioeconomic History  . Marital status: Married    Spouse name: ardith  . Number of children: 7  . Years of education: college  . Highest education level: Not on file  Occupational History  . Occupation: retired  Scientific laboratory technician  . Financial resource strain: Not on file  . Food insecurity:    Worry: Not on file    Inability: Not on file  . Transportation needs:    Medical: Not on file    Non-medical: Not on file  Tobacco Use  . Smoking status: Former Smoker    Packs/day: 0.75    Years: 40.00    Pack years: 30.00    Types: Cigarettes    Start date: 06/18/1972    Last attempt to quit: 10/24/2015    Years  since quitting: 2.6  . Smokeless tobacco: Never Used  Substance and Sexual Activity  . Alcohol use: No    Alcohol/week: 0.0 standard drinks  . Drug use: No  . Sexual activity: Not Currently  Lifestyle  . Physical activity:    Days per week: Not on file    Minutes per session: Not on file  . Stress: Not on file  Relationships  . Social connections:    Talks on phone: Not on file    Gets together: Not on file    Attends religious service: Not on file    Active member of club or organization: Not on file    Attends meetings of clubs or organizations: Not on file    Relationship status: Not on file  . Intimate partner violence:  Fear of current or ex partner: Not on file    Emotionally abused: Not on file    Physically abused: Not on file    Forced sexual activity: Not on file  Other Topics Concern  . Not on file  Social History Narrative   Lives in [place] with wife.  Does not routinely exercise.  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     Family History  Problem Relation Age of Onset  . Other Mother        died @ 79 - complications following surgery.  . Hypertension Father        died in MVA @ 69    ROS: [x]  Positive   [ ]  Negative   [ ]  All sytems reviewed and are negative  Cardiovascular: []  chest pain/pressure []  palpitations []  SOB lying flat []  DOE []  pain in legs while walking []  pain in legs at rest []  pain in legs at night []  non-healing ulcers []  hx of DVT [x]  swelling in legs  Pulmonary: []  productive cough [x]  asthma/wheezing []  home O2  Neurologic: []  weakness in []  arms []  legs []  numbness in []  arms []  legs []  hx of CVA []  mini stroke [] difficulty speaking or slurred speech []  temporary loss of vision in one eye []  dizziness  Hematologic: [x]  hx of cancer [x]  bleeding problems []  problems with blood clotting easily  Endocrine:   []  diabetes []  thyroid disease  GI []  vomiting blood []  blood  in stool  GU: []  CKD/renal failure []  HD--[]  M/W/F or []  T/T/S []  burning with urination []  blood in urine  Psychiatric: []  anxiety []  depression  Musculoskeletal: []  arthritis []  joint pain  Integumentary: []  rashes []  ulcers  Constitutional: []  fever []  chills   Physical Examination  Vitals:   06/23/18 0800 06/23/18 1200  BP:    Pulse:    Resp:    Temp: 98.8 F (37.1 C) (!) 97.3 F (36.3 C)  SpO2:     Body mass index is 19.89 kg/m.  General: No acute distress currently on oxygen HENT: Bilateral temporal wasting Pulmonary: normal non-labored breathing Cardiac: Readily palpable bilateral common femoral pulses I cannot palpate popliteal or tibial pulses Abdomen: Soft and nontender, his aorta is readily palpable Extremities: Edema bilateral lower extremities beginning in the thighs extending down to the feet Feet are purple discolored with brisk capillary refill Neurologic: Bilateral feet are sensorimotor intact   CBC    Component Value Date/Time   WBC 53.7 (HH) 06/23/2018 0314   RBC 3.32 (L) 06/23/2018 0314   HGB 9.4 (L) 06/23/2018 0314   HGB 11.9 (L) 06/10/2018 1345   HGB 12.1 (L) 05/28/2017 0932   HCT 28.9 (L) 06/23/2018 0314   HCT 37.5 (L) 05/28/2017 0932   PLT 152 06/23/2018 0314   PLT 325 06/10/2018 1345   PLT 241 05/28/2017 0932   PLT 354 03/26/2017 1537   MCV 87.0 06/23/2018 0314   MCV 85.8 05/28/2017 0932   MCH 28.3 06/23/2018 0314   MCHC 32.5 06/23/2018 0314   RDW 16.6 (H) 06/23/2018 0314   RDW 14.7 (H) 05/28/2017 0932   LYMPHSABS 1.6 06/23/2018 0314   LYMPHSABS 0.7 (L) 05/28/2017 0932   MONOABS 3.0 (H) 06/23/2018 0314   MONOABS 0.5 05/28/2017 0932   EOSABS 0.0 06/23/2018 0314   EOSABS 0.2 05/28/2017 0932   EOSABS 0.3 03/26/2017 1537   BASOSABS 0.2 (H) 06/23/2018 0314   BASOSABS 0.1 05/28/2017 0932  BMET    Component Value Date/Time   NA 129 (L) 06/23/2018 0314   NA 132 (L) 12/24/2017 1131   NA 133 (L) 05/28/2017 0932   K  4.4 06/23/2018 0314   K 4.4 05/28/2017 0932   CL 98 06/23/2018 0314   CO2 21 (L) 06/23/2018 0314   CO2 24 05/28/2017 0932   GLUCOSE 171 (H) 06/23/2018 0314   GLUCOSE 197 (H) 05/28/2017 0932   BUN 63 (H) 06/23/2018 0314   BUN 6 (L) 12/24/2017 1131   BUN 9.3 05/28/2017 0932   CREATININE 0.97 06/23/2018 0314   CREATININE 0.65 06/10/2018 1345   CREATININE 0.8 05/28/2017 0932   CALCIUM 9.4 06/23/2018 0314   CALCIUM 8.5 05/28/2017 0932   GFRNONAA >60 06/23/2018 0314   GFRNONAA >60 06/10/2018 1345   GFRAA >60 06/23/2018 0314   GFRAA >60 06/10/2018 1345    COAGS: Lab Results  Component Value Date   INR 1.14 06/21/2018   INR 1.14 06/21/2018   INR 1.04 11/08/2017     Non-Invasive Vascular Imaging:   No recent studies   ASSESSMENT/PLAN: This is a 73 y.o. male with multiple medical comorbidities previous history of carotid and coronary disease now with purple discoloration of bilateral toes.  Most likely related to his overall condition.  Has good capillary refill to suggest adequate blood flow although pulses are nonpalpable which may be secondary to amount of edema he has.  I will check ABIs but doubtful that he will need any vascular surgical intervention.  When stable from a bleeding standpoint should be back on his Plavix.  Harutyun Monteverde C. Donzetta Matters, MD [facility] Office: [phone] Pager: [phone]

## 2018-06-23 NOTE — Progress Notes (Signed)
Initial Nutrition Assessment  DOCUMENTATION CODES:   Severe malnutrition in context of chronic illness  INTERVENTION:   Continue Ensure Enlive po BID, each supplement provides 350 kcal and 20 grams of protein  NUTRITION DIAGNOSIS:   Severe Malnutrition related to cancer and cancer related treatments, chronic illness as evidenced by energy intake < or equal to 75% for > or equal to 1 month, percent weight loss.  GOAL:   Patient will meet greater than or equal to 90% of their needs  MONITOR:   PO intake, Supplement acceptance, Labs, Weight trends, I & O's, Skin  REASON FOR ASSESSMENT:   Malnutrition Screening Tool   ASSESSMENT:   73 year old gentleman, medical history of abdominal aortic ectasia, coronary artery disease on Plavix, history of non-STEMI May 0240, grade 2 diastolic dysfunction, LVH, type 2 diabetes, hyperlipidemia, hypertension, history of leukocytosis, chronic constipation on Movantik and as needed MiraLAX, history of progressive stage IV non-small cell lung cancer currently not on treatment presented to the emergency room with rectal bleeding, weakness and shortness of breath.   Patient sleeping during visit. Pt with no recorded PO intakes. Per chart review of [facility] notes, pt has had poor appetite and intakes since October 2019. Pt was actively receiving treatment at that time forstage IV NSCLC. Pt was struggling with constipation at that time as well. Pt was recommended palliative care focus but pt is eager to have more treatment if possible. Per oncology note 1/13, no treatments are currently recommended. Per palliative care note 1/25, pt would like to continue current mode of care.   Per weight records, pt has lost 61 lb since May 2019 (30% wt loss x 8 months, significant for time frame).   Medications reviewed. Labs reviewed: CBGs: 139-173 Low Na   NUTRITION - FOCUSED PHYSICAL EXAM:  Deferred in respect to patient's comfort. Pt with visible wasting in  temporal region. Severe LE edema.  Diet Order:   Diet Order            Diet regular Room service appropriate? Yes; Fluid consistency: Thin  Diet effective now              EDUCATION NEEDS:   Not appropriate for education at this time  Skin:  Skin Assessment: Skin Integrity Issues: Skin Integrity Issues:: Stage I Stage I: coccyx  Last BM:  1/25  Height:   Ht Readings from Last 1 Encounters:  06/21/18 5\' 11"  (1.803 m)    Weight:   Wt Readings from Last 1 Encounters:  06/21/18 64.7 kg    Ideal Body Weight:  78.2 kg  BMI:  Body mass index is 19.89 kg/m.  Estimated Nutritional Needs:   Kcal:  2000-2200  Protein:  100-110g  Fluid:  2L/day  Clayton Bibles, MS, RD, LDN [place] [facility] Pager: [phone] After Hours Pager: [phone]

## 2018-06-23 NOTE — Progress Notes (Signed)
CRITICAL VALUE ALERT  Critical Value:  WBC 53.7  Date & Time Notied:  06/23/2018 4:56 AM   Provider Notified: Bodenheimer NP  Orders Received/Actions taken: None at this time.

## 2018-06-23 NOTE — Progress Notes (Signed)
Subjective: No acute changes.  The patient is sleeping.  Objective: Vital signs in last 24 hours: Temp:  [96.8 F (36 C)-98.8 F (37.1 C)] 97.3 F (36.3 C) (01/26 1200) Pulse Rate:  [91-122] 116 (01/26 0600) Resp:  [26-31] 28 (01/26 0600) BP: (95-129)/(62-86) 122/67 (01/26 0600) SpO2:  [98 %-100 %] 100 % (01/26 0600) Last BM Date: 06/22/18  Intake/Output from previous day: 01/25 0701 - 01/26 0700 In: 690 [Blood:690] Out: 200 [Urine:200] Intake/Output this shift: No intake/output data recorded.  General appearance: sleeping GI: soft, non-tender; bowel sounds normal; no masses,  no organomegaly  Lab Results: Recent Labs    06/21/18 1207  06/22/18 0509 06/22/18 1926 06/23/18 0314  WBC 44.4*  --  37.6*  --  53.7*  HGB 7.9*   < > 7.2* 9.5* 9.4*  HCT 25.2*   < > 22.5* 29.2* 28.9*  PLT 233  --  195  --  152   < > = values in this interval not displayed.   BMET Recent Labs    06/21/18 1207 06/22/18 0509 06/23/18 0314  NA 125* 128* 129*  K 4.7 4.9 4.4  CL 89* 96* 98  CO2 26 22 21*  GLUCOSE 120* 168* 171*  BUN 41* 55* 63*  CREATININE 0.77 0.97 0.97  CALCIUM 9.7 9.1 9.4   LFT Recent Labs    06/22/18 0509  PROT 5.8*  ALBUMIN 2.6*  AST 35  ALT 28  ALKPHOS 82  BILITOT 0.7   PT/INR Recent Labs    06/21/18 1207 06/21/18 1400  LABPROT 14.5 14.5  INR 1.14 1.14   Hepatitis Panel No results for input(s): HEPBSAG, HCVAB, HEPAIGM, HEPBIGM in the last 72 hours. C-Diff No results for input(s): CDIFFTOX in the last 72 hours. Fecal Lactopherrin No results for input(s): FECLLACTOFRN in the last 72 hours.  Studies/Results: Dg Chest Port 1 View  Result Date: 06/22/2018 CLINICAL DATA:  Shortness of breath this morning. History of diabetes, lung cancer, hypertension and coronary artery disease. EXAM: PORTABLE CHEST 1 VIEW COMPARISON:  Chest x-ray dated 06/21/2018. FINDINGS: Heart size and mediastinal contours are stable. Stable RIGHT perihilar opacity, corresponding  to patient's known history of lung cancer, better demonstrated on earlier chest CT of 10/19/2017. Stable volume loss on the RIGHT. LEFT lung remains clear. No pleural effusion or pneumothorax seen. RIGHT-sided Port-A-Cath is stable in position with tip overlying the lower SVC/cavoatrial junction. IMPRESSION: No change compared to yesterday's chest x-ray. Known RIGHT perihilar cancer. No evidence of pneumonia or pulmonary edema. Electronically Signed   By: Franki Cabot M.D.   On: 06/22/2018 10:32    Medications:  Scheduled: . acetaminophen  650 mg Oral Once  . atorvastatin  40 mg Oral Daily  . chlorhexidine  15 mL Mouth Rinse BID  . Chlorhexidine Gluconate Cloth  6 each Topical Daily  . erythromycin  1 application Right Eye QHS  . feeding supplement (ENSURE ENLIVE)  237 mL Oral BID BM  . gatifloxacin  1 drop Right Eye QID  . hydrocortisone  25 mg Rectal BID  . mouth rinse  15 mL Mouth Rinse q12n4p  . naloxegol oxalate  25 mg Oral Daily  . pantoprazole (PROTONIX) IV  40 mg Intravenous Q24H  . polyethylene glycol  17 g Oral BID  . polyvinyl alcohol  1 drop Both Eyes QID  . predniSONE  5 mg Oral BID WC  . sodium chloride flush  10-40 mL Intracatheter Q12H   Continuous: . sodium chloride 75 mL/hr at 06/22/18 1900  Assessment/Plan: 1) Presumed diverticular bleed. 2) Metastatic lung cancer.   His HGB is stable.  WBC markedly increased.  Currently there is no further GI intervention that will be of any benefit.  Plan: 1) Signing off. 2) If you have further questions, please call [facility].   LOS: 2 days   Teniola Tseng D 06/23/2018, 12:46 PM

## 2018-06-24 ENCOUNTER — Inpatient Hospital Stay (HOSPITAL_COMMUNITY): Payer: Medicare Other

## 2018-06-24 ENCOUNTER — Encounter (HOSPITAL_COMMUNITY): Payer: Self-pay | Admitting: Gastroenterology

## 2018-06-24 DIAGNOSIS — L810 Postinflammatory hyperpigmentation: Secondary | ICD-10-CM

## 2018-06-24 DIAGNOSIS — I959 Hypotension, unspecified: Secondary | ICD-10-CM | POA: Clinically undetermined

## 2018-06-24 DIAGNOSIS — E43 Unspecified severe protein-calorie malnutrition: Secondary | ICD-10-CM

## 2018-06-24 LAB — BPAM RBC
BLOOD PRODUCT EXPIRATION DATE: 202002222359
Blood Product Expiration Date: 202002202359
Blood Product Expiration Date: 202002222359
ISSUE DATE / TIME: 202001241337
ISSUE DATE / TIME: 202001251455
ISSUE DATE / TIME: 202001251126
UNIT TYPE AND RH: 6200
Unit Type and Rh: 6200
Unit Type and Rh: 6200

## 2018-06-24 LAB — URINALYSIS, ROUTINE W REFLEX MICROSCOPIC
Bacteria, UA: NONE SEEN
Bilirubin Urine: NEGATIVE
Glucose, UA: NEGATIVE mg/dL
Ketones, ur: NEGATIVE mg/dL
Leukocytes, UA: NEGATIVE
Nitrite: NEGATIVE
Protein, ur: 30 mg/dL — AB
Specific Gravity, Urine: 1.023 (ref 1.005–1.030)
pH: 5 (ref 5.0–8.0)

## 2018-06-24 LAB — GLUCOSE, CAPILLARY
GLUCOSE-CAPILLARY: 148 mg/dL — AB (ref 70–99)
Glucose-Capillary: 169 mg/dL — ABNORMAL HIGH (ref 70–99)
Glucose-Capillary: 187 mg/dL — ABNORMAL HIGH (ref 70–99)
Glucose-Capillary: 130 mg/dL — ABNORMAL HIGH (ref 70–99)

## 2018-06-24 LAB — CBC WITH DIFFERENTIAL/PLATELET
Abs Immature Granulocytes: 1.86 10*3/uL — ABNORMAL HIGH (ref 0.00–0.07)
Abs Immature Granulocytes: 1.91 10*3/uL — ABNORMAL HIGH (ref 0.00–0.07)
BASOS PCT: 0 %
Basophils Absolute: 0.2 10*3/uL — ABNORMAL HIGH (ref 0.0–0.1)
Basophils Absolute: 0.2 10*3/uL — ABNORMAL HIGH (ref 0.0–0.1)
Basophils Relative: 0 %
Eosinophils Absolute: 0 10*3/uL (ref 0.0–0.5)
Eosinophils Absolute: 0 10*3/uL (ref 0.0–0.5)
Eosinophils Relative: 0 %
Eosinophils Relative: 0 %
HCT: 27.7 % — ABNORMAL LOW (ref 39.0–52.0)
HCT: 27 % — ABNORMAL LOW (ref 39.0–52.0)
Hemoglobin: 8.9 g/dL — ABNORMAL LOW (ref 13.0–17.0)
Hemoglobin: 8.6 g/dL — ABNORMAL LOW (ref 13.0–17.0)
Immature Granulocytes: 3 %
Immature Granulocytes: 4 %
Lymphocytes Relative: 3 %
Lymphocytes Relative: 3 %
Lymphs Abs: 1.3 10*3/uL (ref 0.7–4.0)
Lymphs Abs: 1.4 10*3/uL (ref 0.7–4.0)
MCH: 29.1 pg (ref 26.0–34.0)
MCH: 28.7 pg (ref 26.0–34.0)
MCHC: 32.1 g/dL (ref 30.0–36.0)
MCHC: 31.9 g/dL (ref 30.0–36.0)
MCV: 90.5 fL (ref 80.0–100.0)
MCV: 90 fL (ref 80.0–100.0)
Monocytes Absolute: 2.7 10*3/uL — ABNORMAL HIGH (ref 0.1–1.0)
Monocytes Absolute: 3 10*3/uL — ABNORMAL HIGH (ref 0.1–1.0)
Monocytes Relative: 5 %
Monocytes Relative: 6 %
NEUTROS PCT: 89 %
NEUTROS PCT: 87 %
Neutro Abs: 47.8 10*3/uL — ABNORMAL HIGH (ref 1.7–7.7)
Neutro Abs: 47.6 10*3/uL — ABNORMAL HIGH (ref 1.7–7.7)
PLATELETS: 119 10*3/uL — AB (ref 150–400)
Platelets: 120 10*3/uL — ABNORMAL LOW (ref 150–400)
RBC: 3.06 MIL/uL — ABNORMAL LOW (ref 4.22–5.81)
RBC: 3 MIL/uL — ABNORMAL LOW (ref 4.22–5.81)
RDW: 17.5 % — ABNORMAL HIGH (ref 11.5–15.5)
RDW: 17.8 % — ABNORMAL HIGH (ref 11.5–15.5)
WBC: 53.9 10*3/uL (ref 4.0–10.5)
WBC: 54.1 10*3/uL (ref 4.0–10.5)
nRBC: 0 % (ref 0.0–0.2)
nRBC: 0 % (ref 0.0–0.2)

## 2018-06-24 LAB — COMPREHENSIVE METABOLIC PANEL
ALT: 33 U/L (ref 0–44)
AST: 30 U/L (ref 15–41)
Albumin: 3.3 g/dL — ABNORMAL LOW (ref 3.5–5.0)
Alkaline Phosphatase: 91 U/L (ref 38–126)
Anion gap: 10 (ref 5–15)
BUN: 51 mg/dL — ABNORMAL HIGH (ref 8–23)
CO2: 22 mmol/L (ref 22–32)
Calcium: 10.2 mg/dL (ref 8.9–10.3)
Chloride: 98 mmol/L (ref 98–111)
Creatinine, Ser: 0.74 mg/dL (ref 0.61–1.24)
GFR calc Af Amer: >60 mL/min (ref >60)
Glucose, Bld: 196 mg/dL — ABNORMAL HIGH (ref 70–99)
Potassium: 3.8 mmol/L (ref 3.5–5.1)
Sodium: 130 mmol/L — ABNORMAL LOW (ref 135–145)
Total Bilirubin: 0.8 mg/dL (ref 0.3–1.2)
Total Protein: 6.5 g/dL (ref 6.5–8.1)

## 2018-06-24 LAB — TYPE AND SCREEN
ABO/RH(D): A POS
Antibody Screen: NEGATIVE
Unit division: 0
Unit division: 0
Unit division: 0

## 2018-06-24 LAB — PROCALCITONIN: PROCALCITONIN: 0.19 ng/mL

## 2018-06-24 LAB — LACTIC ACID, PLASMA: Lactic Acid, Venous: 2 mmol/L (ref 0.5–1.9)

## 2018-06-24 LAB — PHOSPHORUS: Phosphorus: 2.5 mg/dL (ref 2.5–4.6)

## 2018-06-24 LAB — HEMOGLOBIN AND HEMATOCRIT, BLOOD
HCT: 26.8 % — ABNORMAL LOW (ref 39.0–52.0)
Hemoglobin: 8.6 g/dL — ABNORMAL LOW (ref 13.0–17.0)

## 2018-06-24 LAB — MAGNESIUM: Magnesium: 2 mg/dL (ref 1.7–2.4)

## 2018-06-24 MED ORDER — METOPROLOL SUCCINATE ER 25 MG PO TB24
50.0000 mg | ORAL_TABLET | Freq: Two times a day (BID) | ORAL | Status: DC
  Administered 2018-06-24: 50 mg via ORAL
  Filled 2018-06-24 (×2): qty 2

## 2018-06-24 MED ORDER — VANCOMYCIN HCL IN DEXTROSE 750-5 MG/150ML-% IV SOLN
750.0000 mg | Freq: Two times a day (BID) | INTRAVENOUS | Status: DC
  Administered 2018-06-24: 750 mg via INTRAVENOUS
  Filled 2018-06-24 (×2): qty 150

## 2018-06-24 MED ORDER — SODIUM CHLORIDE 0.45 % IV SOLN
INTRAVENOUS | Status: DC
  Administered 2018-06-24 – 2018-06-25 (×3): via INTRAVENOUS

## 2018-06-24 MED ORDER — SODIUM CHLORIDE 0.9 % IV BOLUS
500.0000 mL | Freq: Once | INTRAVENOUS | Status: AC
  Administered 2018-06-24: 500 mL via INTRAVENOUS

## 2018-06-24 MED ORDER — VANCOMYCIN HCL 10 G IV SOLR
1250.0000 mg | Freq: Once | INTRAVENOUS | Status: AC
  Administered 2018-06-24: 1250 mg via INTRAVENOUS
  Filled 2018-06-24: qty 1250

## 2018-06-24 MED ORDER — SODIUM CHLORIDE 0.9 % IV SOLN
2.0000 g | Freq: Once | INTRAVENOUS | Status: AC
  Administered 2018-06-24: 2 g via INTRAVENOUS
  Filled 2018-06-24: qty 2

## 2018-06-24 MED ORDER — SODIUM CHLORIDE 0.9 % IV SOLN
1.0000 g | Freq: Three times a day (TID) | INTRAVENOUS | Status: DC
  Administered 2018-06-24 – 2018-07-02 (×23): 1 g via INTRAVENOUS
  Filled 2018-06-24 (×26): qty 1

## 2018-06-24 NOTE — Progress Notes (Signed)
PT Cancellation Note  Patient Details Name: Randy [name] MRN: [id number] DOB: 1946/04/30   Cancelled Treatment:    Reason Eval/Treat Not Completed: Medical issues which prohibited therapy, per RN, to get blood and BP low.    Claretha Cooper 06/24/2018, 11:38 AM  [place] Pager [phone] Office [phone]

## 2018-06-24 NOTE — Care Management Note (Signed)
Case Management Note  Patient Details  Name: Randy [name] MRN: [id number] Date of Birth: [birth year]  Subjective/Objective:                  Discharge Readiness Return to top of Sepsis and Other Febrile Illness, without Focal Infection RRG - [place]  Discharge readiness is indicated by patient meeting Recovery Milestones, including ALL of the following: ? Hemodynamic stability yes ? Fever absent  98.0 ? Hypoxemia absent yes ? Mental status at baseline yes  ? End-organ dysfunction (eg, myocardial ischemia, renal failure) absent  yes ? Metabolic derangement (eg, dehydration, acidosis) absent na 130/bun-51/creat.-0.74 ? Cultures negative or infection identified and under adequate treatment  Blood cultures pending x 2 days x2 sites ? Ambulatory no ? Oral hydration, medications[P] ? Reg diet/iv 1/2ns at 100cc/hrs ? WBC 012720-53.9 hgb=8.9 rec'ing one until prbcs 012720    Level of care-SDU Action/Plan: Following for progression of care and condition. Following for cm needs none present at this time.  Expected Discharge Date:  (unknown)               Expected Discharge Plan:     In-House Referral:     Discharge planning Services     Post Acute Care Choice:    Choice offered to:     DME Arranged:    DME Agency:     HH Arranged:    HH Agency:     Status of Service:     If discussed at [employer], dates discussed:    Additional Comments:  Leeroy Cha, RN 06/24/2018, 10:05 AM

## 2018-06-24 NOTE — Progress Notes (Signed)
ABI's have been completed. Preliminary results can be found in CV Proc through chart review.   06/24/18 8:52 AM Randy [name] RVT

## 2018-06-24 NOTE — Progress Notes (Signed)
OT Cancellation Note  Patient Details Name: HENDRIK DONATH MRN: [id number] DOB: May 25, 1946   Cancelled Treatment:    Reason Eval/Treat Not Completed: Medical issues which prohibited therapy; low BP and noted per notes pt to receive blood sometime today. Will follow.   Lou Cal, OT [facility] Pager [phone] Office [phone]   Raymondo Band 06/24/2018, 1:19 PM

## 2018-06-24 NOTE — Progress Notes (Signed)
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  Patient on admission noted to have a hemoglobin of 7.9 from 11.9 on 06/10/2018.  Patient transfused a unit of packed red blood cells however had ongoing rectal bleeding the night of 06/21/2018 with 7 episodes of bloody bowel movements with hemoglobin dropping down to 7.2 despite a unit of packed red blood cell transfusion.  GI was consulted and felt that patient's anemia/bleed could likely be multifactorial secondary to internal hemorrhoid hemorrhage versus rectal stercoral ulcer.  Patient to be transfused 2 more units of packed red blood cells.  GI to reassess to see if patient is a candidate for any intervention/colonoscopy.   Assessment & Plan:   Principal Problem:   GI bleed Active Problems:   Hematochezia   Hypotension   Hyperlipidemia   Coronary artery disease   Leukocytosis   Diabetes mellitus without complication (HCC)   Chronic obstructive pulmonary disease (HCC)   Grade II diastolic dysfunction   Mild protein-calorie malnutrition (HCC)   Stage IV squamous cell carcinoma of right lung (HCC)   Therapeutic opioid induced constipation   Hyponatremia   Pressure injury of skin   SOB (shortness of breath)   Palliative care by specialist   Goals of care, counseling/discussion   Protein-calorie malnutrition, severe   Encounter for palliative care  1 GIB/hematochezia  secondary to presumed diverticular bleed Questionable etiology.  Presumably felt may be secondary to diverticular bleed Per flex sigmoidoscopy done on 06/22/2018 per GI.   Patient noted to have history of chronic constipation likely opioid induced on Movantik and as needed MiraLAX at home.  Prior to admission patient noted to have significant constipation 1 week prior to admission with concerns for impaction as patient had stool the size of a baseball per patient.  Concern for hemorrhoidal bleed versus steroid coral ulcer.  Patient with ongoing GI bleed with 7 bloody bowel movements that night of 06/21/2018.  On admission hemoglobin was 7.9 and patient transfused a unit packed red blood cells.  Hemoglobin went up to 9.0 then down to 8.3 and then down to 7.2 the morning of 06/22/2018.  Patient transfused 2 more units of packed red blood cells and has received a total of 3 units of packed red blood cells during this hospitalization.  Hemoglobin currently at 8.9.  No further bleeding overnight per patient and per RN.   Anemia panel was not obtained on admission and likely not valuable at this time posttransfusion. GI initially on consultation, recommended continuation of Movantik, MiraLAX twice daily and patient placed on rectal hydrocortisone suppositories twice daily.  Due to patient's ongoing bleed on 06/22/2018 patient was reassessed by Dr. Geoffry Paradise of gastroenterology and patient underwent a flex sigmoidoscopy which did show diverticulosis.  It was felt patient may likely have a diverticular bleed.  Plavix and aspirin on hold.  Hemoglobin currently at 8.9.  Follow H&H. Transfusion threshold hemoglobin less than 8.  Follow H&H.  2.  Symptomatic anemia/acute blood loss anemia Secondary to problem #1.  Patient status post transfusion of 1 unit packed red blood cells on admission.  Patient transfused 2 units of packed red blood cells on 06/22/2018 and has received a total of 3 units of packed red blood cells..   Hemoglobin currently at 8.9 from 9.3 from 9.4 from 9.5 from 7.2 from 7.9 on admission.  Patient noted to have ongoing GI bleed however no further bleeding over night.  Status post flexible sigmoidoscopy per GI on 06/22/2018.  Repeat H&H this afternoon.  Transfusion threshold hemoglobin less than 8. GI following.  3.  Hypotension Secondary to problem #1 versus infectious etiology.  Patient with a worsening leukocytosis.  Blood pressure initially improved with transfusions of packed red blood cells and saline.  IV fluids were saline locked.  Systolic blood pressures in the 70s this morning.  Give a 500 cc bolus of normal saline.  Repeat blood cultures x2, check a chest x-ray, check a lactic acid level, check a procalcitonin level, UA with cultures and sensitivities.  IV fluids at 100 cc/h.  Repeat H&H this afternoon.  If blood pressure does not respond to IV fluids may need to be placed on pressors.   4.  Hypothermia Questionable etiology.  Chest x-ray unremarkable.  Urinalysis unremarkable.  Blood cultures pending.  With recent TSH of 2.0 on 06/10/2018.  Hypothermia improved with a temperature of 97.4 this morning.  Follow.  Retail banker as needed.  5.  Diabetes mellitus type 2 Last hemoglobin A1c was 6.7 on 03/29/2018.  CBG of 148 this morning.  Changed CBGs to [facility] and at bedtime.    6.  Hyponatremia Likely secondary to hypovolemic hyponatremia.  Patient presented with GI bleed.  Patient noted to have blood pressure drop as low as 78/57.  Patient noted to be tachycardic.  Status post transfusion of packed red blood cells.  Sodium levels slowly improving.  Patient is IV fluids saline lock.  Patient now hypotensive and as such we will place back on fluids.  Follow.   7.  Stage IV squamous cell carcinoma of the right lung Patient with progressive disease.  Patient status post concurrent chemoradiation therapy in the past, consolidation immunotherapy, status post clinical trial with Tisotumab Vedotin at  [facility] early June 2019 which was subsequently discontinued secondary to disease progression.  Patient status post trial of single agent of gemcitabine for 1 dose which was also discontinued secondary to deterioration of performance status.  Patient at that time was advised to consider palliative care and hospice but patient declined this option and looking for more treatment if possible.  Patient being followed by Dr. Lorna Few who last saw patient 06/10/2018 and felt that due to his poor performance status patient may not be a good candidate for any treatment with his current status and recommended patient to work on his nutrition for the next few weeks.  Patient subsequently started on prednisone 10 mg twice daily in addition to Remeron and patient referred to dietitian at [facility] for nutritional consultation.  Patient with a poor prognosis.  Palliative care following.  Outpatient follow-up with his oncologist Dr. Lorna Few.  8.  Dusky feet/bluish discoloration of toes concern for ischemic limbs Some concern for ischemic changes.  Patient likely with poor perfusion due to significant GI bleed.  Dopplers done and noted to have pulses on the right dorsalis pedis and tibialis and left tibialis.  Unable to obtain pulses on the left dorsalis pedis.  Patient presented with a GI bleed and as such  Plavix and aspirin on hold and difficult to anticoagulate at this time.  Patient seen in consultation by Dr. Donzetta Matters vascular surgery who recommended ABIs which were done 06/24/2018 which were unremarkable.  Patient now with feet that are warm and less blue discoloration.  As per surgery doubt the patient will need any vascular surgical intervention at this time.  Per vascular surgery when stable from a bleeding standpoint to resume patient's Plavix.  Follow for now.   9.  Constipation Continue Movantik and MiraLAX twice daily.  10.  Leukocytosis Initially felt likely secondary to  steroids as patient on prednisone 5 mg twice daily and likely progressive lung cancer..  Patient noted to be hypo-thermic.  Urinalysis unremarkable.  Chest x-ray unremarkable.  Blood cultures ordered and are pending.  Leukocytosis worsening.  Patient afebrile.  Patient also now hypotensive.  Repeat blood cultures x2, repeat chest x-ray, repeat UA with cultures and sensitivities, check a lactic acid level, check a pro calcitonin level.  Will place empirically on IV vancomycin IV cefepime.  Follow.   11.  Hyperlipidemia Continue statin.  12.  Coronary artery disease Patient with no chest pain at this moment.  Continue to hold aspirin, Plavix and metoprolol due to acute GI bleed and hypotension.   13.  Grade 2 diastolic dysfunction Compensated.  Patient with some lower extremity edema.  Patient beta-blocker held on admission due to hypotension.  Beta-blocker was to be resumed this morning however patient currently hypotensive with systolic blood pressures in the 70s and as such we will continue to hold beta-blocker for now.  Aspirin and Plavix on hold due to presentation with acute GI bleed.  Continue statin.  IV fluids with saline lock however will place back on IV fluids at 100 cc/h due to hypotension.   14 severe protein calorie malnutrition Likely secondary to progressive stage IV lung cancer.  Prealbumin 11.0.  Continue Ensure.  Status post 1 dose IV albumin.  Follow.    15. Stage 1 pressure injury to coccyx Wound care..  16.  Shortness of breath Likely secondary to GI bleed/symptomatic anemia and progressive lung cancer.  Repeat chest x-ray on 06/22/2018- for any pulmonary edema or infiltrate, similar progressive lung mass noted.  Will need to check ambulatory sats prior to discharge.  May need home O2.  Repeat chest x-ray pending due to worsening leukocytosis.    DVT prophylaxis: SCDs Code Status: Partial Family Communication: Updated patient, and wife and sister-in-law at bedside.    Disposition Plan: Transfer to ICU. to be determined.   Consultants:   Gastroenterology: Dr. Silverio Decamp 06/21/2018  Palliative care: Dr.Anwar 06/22/2018  Vascular surgery: Dr. Donzetta Matters 06/23/2018  Procedures:   Chest x-ray 06/21/2018, 06/22/2018  1 unit packed red blood cells 06/21/2018  2 units packed red blood cells 06/22/2018  Flexible sigmoidoscopy per [name] 06/22/2018  ABIs 12/16/2018  Antimicrobials:   IV vancomycin 06/24/2018  IV cefepime 06/24/2018   Subjective: Patient laying in bed.  Denies any chest pain.  Denies any significant change in shortness of breath.  States has not had any more bloody bowel movements or any bowel movement today.  Denies any dysuria.  Denies any dizziness.   Objective: Vitals:   06/24/18 0500 06/24/18 0800 06/24/18 1000 06/24/18 1004  BP:  92/62 (!) 78/54 (!) 76/54  Pulse: (!) 119 (!) 117 (!) 118 (!) 117  Resp: (!) 23 20 (!) 24 (!) 25  Temp:  (!) 97.4 F (36.3 C)    TempSrc:  Oral  SpO2: 100% 94% 100% 100%  Weight:      Height:        Intake/Output Summary (Last 24 hours) at 06/24/2018 1010 Last data filed at 06/24/2018 0915 Gross per 24 hour  Intake 545 ml  Output -  Net 545 ml   Filed Weights   06/21/18 2000  Weight: 64.7 kg    Examination:  General exam: Cachectic.  Pale.  Temporal wasting.  Chronically ill-appearing. Respiratory system: CTAB anterior lung fields.  No wheezes, no crackles, no rhonchi.  Speaking in full sentences.  Normal respiratory effort. Cardiovascular system: Tachycardia.  No JVD, no murmurs rubs or gallops.  1-2+ bilateral lower extremity edema. Gastrointestinal system: Abdomen is soft, nontender, nondistended, positive bowel sounds.  Scaphoid abdomen.  No rebound.  No guarding.  Central nervous system: Alert and oriented. No focal neurological deficits. Extremities: 1-2+ bilateral lower extremity edema.  Feet are warm to touch, somewhat dusky, toes with decreasing blue discoloration.  Skin: No rashes,  lesions or ulcers Psychiatry: Judgement and insight appear fair. Mood & affect appropriate.     Data Reviewed: I have personally reviewed following labs and imaging studies  CBC: Recent Labs  Lab 06/21/18 1207  06/22/18 0509 06/22/18 1926 06/23/18 0314 06/23/18 1551 06/24/18 0640  WBC 44.4*  --  37.6*  --  53.7*  --  53.9*  NEUTROABS 38.8*  --  31.9*  --  47.0*  --  47.8*  HGB 7.9*   < > 7.2* 9.5* 9.4* 9.3* 8.9*  HCT 25.2*   < > 22.5* 29.2* 28.9* 28.7* 27.7*  MCV 85.4  --  86.5  --  87.0  --  90.5  PLT 233  --  195  --  152  --  120*   < > = values in this interval not displayed.   Basic Metabolic Panel: Recent Labs  Lab 06/21/18 1207 06/22/18 0509 06/23/18 0314 06/24/18 0640  NA 125* 128* 129* 130*  K 4.7 4.9 4.4 3.8  CL 89* 96* 98 98  CO2 26 22 21* 22  GLUCOSE 120* 168* 171* 196*  BUN 41* 55* 63* 51*  CREATININE 0.77 0.97 0.97 0.74  CALCIUM 9.7 9.1 9.4 10.2  MG  --   --  1.9 2.0  PHOS  --   --  3.9 2.5   GFR: Estimated Creatinine Clearance: 76.4 mL/min (by C-G formula based on SCr of 0.74 mg/dL). Liver Function Tests: Recent Labs  Lab 06/21/18 1207 06/22/18 0509 06/24/18 0640  AST 20 35 30  ALT 15 28 33  ALKPHOS 91 82 91  BILITOT 1.2 0.7 0.8  PROT 6.4* 5.8* 6.5  ALBUMIN 3.0* 2.6* 3.3*   Recent Labs  Lab 06/21/18 1207  LIPASE 27   No results for input(s): AMMONIA in the last 168 hours. Coagulation Profile: Recent Labs  Lab 06/21/18 1207 06/21/18 1400  INR 1.14 1.14   Cardiac Enzymes: No results for input(s): CKTOTAL, CKMB, CKMBINDEX, TROPONINI in the last 168 hours. BNP (last 3 results) No results for input(s): PROBNP in the last 8760 hours. HbA1C: No results for input(s): HGBA1C in the last 72 hours. CBG: Recent Labs  Lab 06/23/18 0737 06/23/18 1222 06/23/18 1631 06/23/18 2227 06/24/18 0825  GLUCAP 139* 173* 165* 191* 148*   Lipid Profile: No results for input(s): CHOL, HDL, LDLCALC, TRIG, CHOLHDL, LDLDIRECT in the last 72  hours. Thyroid Function Tests: No results for input(s): TSH, T4TOTAL, FREET4, T3FREE, THYROIDAB in the last 72 hours. Anemia Panel: No results for  input(s): VITAMINB12, FOLATE, FERRITIN, TIBC, IRON, RETICCTPCT in the last 72 hours. Sepsis Labs: No results for input(s): PROCALCITON, LATICACIDVEN in the last 168 hours.  Recent Results (from the past 240 hour(s))  Blood culture (routine x 2)     Status: None (Preliminary result)   Collection Time: 06/21/18  1:05 PM  Result Value Ref Range Status   Specimen Description   Final    BLOOD PORTA CATH Performed at [facility] [address] [place], [place] [address]    Special Requests   Final    BOTTLES DRAWN AEROBIC AND ANAEROBIC Blood Culture adequate volume Performed at [facility] [address] [place], [place] [address]    Culture   Final    NO GROWTH 2 DAYS Performed at [facility] [address] [place], [place] [address]    Report Status PENDING  Incomplete  Blood culture (routine x 2)     Status: None (Preliminary result)   Collection Time: 06/21/18  1:05 PM  Result Value Ref Range Status   Specimen Description   Final    BLOOD PORT Performed at [facility] [address] [place], [place] [address]    Special Requests   Final    BOTTLES DRAWN AEROBIC AND ANAEROBIC Blood Culture adequate volume Performed at [facility] [address], [place], [place] [address]    Culture   Final    NO GROWTH 2 DAYS Performed at [facility] [address], [place], [place] [address]    Report Status PENDING  Incomplete  MRSA PCR Screening     Status: None   Collection Time: 06/21/18  9:14 PM  Result Value Ref Range Status   MRSA by PCR NEGATIVE NEGATIVE Final    Comment:        The GeneXpert MRSA Assay (FDA approved for NASAL specimens only), is one component of a comprehensive MRSA colonization surveillance program. It is  not intended to diagnose MRSA infection nor to guide or monitor treatment for MRSA infections. Performed at [facility], [place] [address] [place], [place] [address]          Radiology Studies: Vas [place] Abi With/wo Tbi  Result Date: 06/24/2018 LOWER EXTREMITY DOPPLER STUDY Indications: Discolored toes.  Performing Technologist: Carlos Levering RVT  Examination Guidelines: A complete evaluation includes at minimum, Doppler waveform signals and systolic blood pressure reading at the level of bilateral brachial, anterior tibial, and posterior tibial arteries, when vessel segments are accessible. Bilateral testing is considered an integral part of a complete examination. Photoelectric Plethysmograph (PPG) waveforms and toe systolic pressure readings are included as required and additional duplex testing as needed. Limited examinations for reoccurring indications may be performed as noted.  ABI Findings: +--------+------------------+-----+---------+--------+ Right   Rt Pressure (mmHg)IndexWaveform Comment  +--------+------------------+-----+---------+--------+ [id number]                    triphasic         +--------+------------------+-----+---------+--------+ PTA     139               1.05 biphasic          +--------+------------------+-----+---------+--------+ DP      113               0.85 biphasic          +--------+------------------+-----+---------+--------+ +--------+------------------+-----+---------+-------+ Left    Lt Pressure (mmHg)IndexWaveform Comment +--------+------------------+-----+---------+-------+ [id number]  triphasic        +--------+------------------+-----+---------+-------+ PTA     145               1.09 biphasic         +--------+------------------+-----+---------+-------+ DP      134               1.01 biphasic         +--------+------------------+-----+---------+-------+  +-------+-----------+-----------+------------+------------+ ABI/TBIToday's ABIToday's TBIPrevious ABIPrevious TBI +-------+-----------+-----------+------------+------------+ Right  1.05                                           +-------+-----------+-----------+------------+------------+ Left   1.09                                           +-------+-----------+-----------+------------+------------+  Summary: Right: Resting right ankle-brachial index is within normal range. No evidence of significant right lower extremity arterial disease. Left: Resting left ankle-brachial index is within normal range. No evidence of significant left lower extremity arterial disease.  *See table(s) above for measurements and observations.    Preliminary         Scheduled Meds: . acetaminophen  650 mg Oral Once  . atorvastatin  40 mg Oral Daily  . chlorhexidine  15 mL Mouth Rinse BID  . Chlorhexidine Gluconate Cloth  6 each Topical Daily  . erythromycin  1 application Right Eye QHS  . feeding supplement (ENSURE ENLIVE)  237 mL Oral BID BM  . gatifloxacin  1 drop Right Eye QID  . hydrocortisone  25 mg Rectal BID  . mouth rinse  15 mL Mouth Rinse q12n4p  . metoprolol succinate  50 mg Oral BID  . naloxegol oxalate  25 mg Oral Daily  . pantoprazole (PROTONIX) IV  40 mg Intravenous Q24H  . polyethylene glycol  17 g Oral BID  . polyvinyl alcohol  1 drop Both Eyes QID  . predniSONE  5 mg Oral BID WC  . sodium chloride flush  10-40 mL Intracatheter Q12H   Continuous Infusions: . sodium chloride    . sodium chloride       LOS: 3 days    Time spent: 40 minutes    Irine Seal, MD [facility]  If 7PM-7AM, please contact night-coverage [email or web address] 06/24/2018, 10:10 AM

## 2018-06-24 NOTE — Progress Notes (Signed)
Pharmacy Antibiotic Note  Randy [name] is a 73 y.o. male admitted on 06/21/2018 with rectal bleed.  Hx of leukocytosis likely due to chronic Prednisone, NSCLC- stage 4 - unable to tolerate chemo. Hypotension worsening, WBC increased, concern for sepsis, Pharmacy has been consulted for Vancomycin and Cefepime dosing.  Plan: Cefepime 2gm x1, then 1gm q8 Vanc 1250mg  x1, then 750mg  q12 (AUC 507, SCr rounded to 1, Cmin 15, IBW/TBW)  Height: 5\' 11"  (180.3 cm) Weight: 142 lb 10.2 oz (64.7 kg) IBW/kg (Calculated) : 75.3  Temp (24hrs), Avg:97.6 F (36.4 C), Min:97.3 F (36.3 C), Max:98 F (36.7 C)  Recent Labs  Lab 06/21/18 1207 06/22/18 0509 06/23/18 0314 06/24/18 0640  WBC 44.4* 37.6* 53.7* 53.9*  CREATININE 0.77 0.97 0.97 0.74    Estimated Creatinine Clearance: 76.4 mL/min (by C-G formula based on SCr of 0.74 mg/dL).    Allergies  Allergen Reactions  . Brilinta [Ticagrelor] Shortness Of Breath   Antimicrobials this admission: 1/27 Cefepime >>  1/27 Vanc >>   Dose adjustments this admission:  Microbiology results: 1/27 BCx: sent  Thank you for allowing pharmacy to be a part of this patient's care.  Minda Ditto PharmD Pager [phone] 06/24/2018, 11:20 AM

## 2018-06-24 NOTE — Progress Notes (Signed)
PMT progress note  Patient is awake alert, resting in bed, no more bowel movements overnight, wife and sister in law at bedside.   Some of his children have come and visited. He is still awaiting the arrival of some other children.  Patient underwent flexible sigmoidoscopy on 06-22-2018, presumed to have a diverticular bleed.   BP (!) 76/54 (BP Location: Left Arm) Comment: Dr.Thompson aware  Pulse (!) 117   Temp (!) 97.4 F (36.3 C) (Oral)   Resp (!) 25   Ht 5\' 11"  (1.803 m)   Wt 64.7 kg   SpO2 100%   BMI 19.89 kg/m   Denies pain, denies shortness of breath, but overall, feels ok.   Awake alert No distress Oriented Non focal Extremities some what more warm to touch, still with dusky appearance S 1 S 2  Regular breath sounds  PPS 30%  Labs and imaging noted Procedures noted  GIB  Lung cancer.   Remains partial code at this time. Patient states that he is awaiting the arrival of some more of his children. Will request chaplain consult for advanced directives paperwork, patient now wishes to designate his wife, Randy [name] as his HCPOA agent, he may have, in years past, made paperwork designating one of his daughters as his HCPOA agent.   He also wishes to have further discussions from a med onc perspective about his cancer. He knows that he has advanced stages of malignancy.   Continue current mode of care. Patient wishes to continue with current treatment measures.   PMT will follow prn.   25 minutes spent.  [name] MD [facility] palliative medicine team.

## 2018-06-24 NOTE — Progress Notes (Signed)
   ABIs reviewed.  No role for vascular surgical intervention.  Patient can follow-up on a as needed basis.  Please call with questions.  Brandon C. Donzetta Matters, MD [facility] Office: [phone] Pager: [phone]

## 2018-06-25 ENCOUNTER — Other Ambulatory Visit: Payer: Self-pay | Admitting: Internal Medicine

## 2018-06-25 DIAGNOSIS — J189 Pneumonia, unspecified organism: Secondary | ICD-10-CM

## 2018-06-25 LAB — CBC WITH DIFFERENTIAL/PLATELET
Abs Immature Granulocytes: 1.8 10*3/uL — ABNORMAL HIGH (ref 0.00–0.07)
BASOS PCT: 0 %
Basophils Absolute: 0.2 10*3/uL — ABNORMAL HIGH (ref 0.0–0.1)
Eosinophils Absolute: 0.1 10*3/uL (ref 0.0–0.5)
Eosinophils Relative: 0 %
HCT: 26.3 % — ABNORMAL LOW (ref 39.0–52.0)
Hemoglobin: 8.3 g/dL — ABNORMAL LOW (ref 13.0–17.0)
Immature Granulocytes: 4 %
Lymphocytes Relative: 3 %
Lymphs Abs: 1.4 10*3/uL (ref 0.7–4.0)
MCH: 28.6 pg (ref 26.0–34.0)
MCHC: 31.6 g/dL (ref 30.0–36.0)
MCV: 90.7 fL (ref 80.0–100.0)
MONOS PCT: 5 %
Monocytes Absolute: 2.6 10*3/uL — ABNORMAL HIGH (ref 0.1–1.0)
NEUTROS PCT: 88 %
Neutro Abs: 45.4 10*3/uL — ABNORMAL HIGH (ref 1.7–7.7)
Platelets: 117 10*3/uL — ABNORMAL LOW (ref 150–400)
RBC: 2.9 MIL/uL — ABNORMAL LOW (ref 4.22–5.81)
RDW: 18.5 % — ABNORMAL HIGH (ref 11.5–15.5)
WBC: 51.5 10*3/uL (ref 4.0–10.5)
nRBC: 0 % (ref 0.0–0.2)

## 2018-06-25 LAB — URINE CULTURE: Culture: NO GROWTH

## 2018-06-25 LAB — EXPECTORATED SPUTUM ASSESSMENT W GRAM STAIN, RFLX TO RESP C

## 2018-06-25 LAB — GLUCOSE, CAPILLARY
Glucose-Capillary: 124 mg/dL — ABNORMAL HIGH (ref 70–99)
Glucose-Capillary: 101 mg/dL — ABNORMAL HIGH (ref 70–99)
Glucose-Capillary: 125 mg/dL — ABNORMAL HIGH (ref 70–99)
Glucose-Capillary: 169 mg/dL — ABNORMAL HIGH (ref 70–99)

## 2018-06-25 LAB — BASIC METABOLIC PANEL
Anion gap: 9 (ref 5–15)
BUN: 37 mg/dL — ABNORMAL HIGH (ref 8–23)
CO2: 22 mmol/L (ref 22–32)
Calcium: 9.5 mg/dL (ref 8.9–10.3)
Chloride: 98 mmol/L (ref 98–111)
Creatinine, Ser: 0.61 mg/dL (ref 0.61–1.24)
GFR calc Af Amer: >60 mL/min (ref >60)
GFR calc non Af Amer: >60 mL/min (ref >60)
Glucose, Bld: 133 mg/dL — ABNORMAL HIGH (ref 70–99)
Potassium: 4.5 mmol/L (ref 3.5–5.1)
Sodium: 129 mmol/L — ABNORMAL LOW (ref 135–145)

## 2018-06-25 LAB — STREP PNEUMONIAE URINARY ANTIGEN: STREP PNEUMO URINARY ANTIGEN: NEGATIVE

## 2018-06-25 LAB — LACTIC ACID, PLASMA: Lactic Acid, Venous: 1.5 mmol/L (ref 0.5–1.9)

## 2018-06-25 LAB — PROCALCITONIN: Procalcitonin: 0.1 ng/mL

## 2018-06-25 MED ORDER — ALBUMIN HUMAN 25 % IV SOLN
25.0000 g | Freq: Four times a day (QID) | INTRAVENOUS | Status: AC
  Administered 2018-06-25: 12.5 g via INTRAVENOUS
  Administered 2018-06-25 – 2018-06-26 (×3): 25 g via INTRAVENOUS
  Filled 2018-06-25 (×2): qty 50
  Filled 2018-06-25: qty 100
  Filled 2018-06-25 (×4): qty 50

## 2018-06-25 MED ORDER — HYDROCODONE-ACETAMINOPHEN 10-325 MG PO TABS
1.0000 | ORAL_TABLET | ORAL | Status: DC | PRN
  Administered 2018-06-25 – 2018-06-30 (×11): 1 via ORAL
  Filled 2018-06-25 (×11): qty 1

## 2018-06-25 MED ORDER — HYDROCODONE-ACETAMINOPHEN 10-325 MG PO TABS
1.0000 | ORAL_TABLET | Freq: Every day | ORAL | Status: DC
  Administered 2018-06-26 – 2018-06-29 (×4): 1 via ORAL
  Filled 2018-06-25 (×4): qty 1

## 2018-06-25 MED ORDER — SODIUM CHLORIDE 0.9 % IV SOLN
INTRAVENOUS | Status: DC
  Administered 2018-06-25 – 2018-06-27 (×2): via INTRAVENOUS

## 2018-06-25 NOTE — Evaluation (Signed)
Occupational Therapy Evaluation Patient Details Name: Randy [name] MRN: [id number] DOB: March 14, 1946 Today's Date: 06/25/2018    History of Present Illness 73 yo male admitted 06/21/18  with GI bleed. H/O lung cancer, DM,  CAD, coronary Stent placed.   Clinical Impression   Pt was admitted for the above. He was independent at home prior to admission, but reports increased weakness/help just prior to admission.  Will follow in acute setting with min guard level goals.       Follow Up Recommendations  SNF    Equipment Recommendations  3 in 1 bedside commode    Recommendations for Other Services       Precautions / Restrictions Precautions Precautions: Fall Precaution Comments: monito BP and sats, rR > L feet are purple, blister on Top of R gr. toe. ABI's normal Restrictions Weight Bearing Restrictions: No      Mobility Bed Mobility Overal bed mobility: Needs Assistance Bed Mobility: Supine to Sit     Supine to sit: Mod assist;+2 for physical assistance     General bed mobility comments: assist for trunk and to scoot to EOB  Transfers Overall transfer level: Needs assistance Equipment used: Rolling walker (2 wheeled) Transfers: Sit to/from [employer] Transfers Sit to Stand: Min assist;+2 safety/equipment Stand pivot transfers: Min assist;Mod assist;+2 physical assistance       General transfer comment: unsteady with SPT    Balance                                           ADL either performed or assessed with clinical judgement   ADL Overall ADL's : Needs assistance/impaired Eating/Feeding: Independent   Grooming: Set up   Upper Body Bathing: Set up   Lower Body Bathing: Moderate assistance   Upper Body Dressing : Minimal assistance   Lower Body Dressing: Maximal assistance   Toilet Transfer: Minimal assistance;+2 for physical assistance;+2 for safety/equipment;Stand-pivot;RW(chair)   Toileting- Clothing Manipulation and  Hygiene: Moderate assistance;Sit to/from stand         General ADL Comments: pt has been in bed x 4 days.  Did not get dizzy, but unsteady     Vision         Perception     Praxis      Pertinent Vitals/Pain Pain Assessment: No/denies pain     Hand Dominance     Extremity/Trunk Assessment Upper Extremity Assessment Upper Extremity Assessment: Generalized weakness   Lower Extremity Assessment Lower Extremity Assessment: RLE deficits/detail;LLE deficits/detail RLE Deficits / Details: reports  plantar surface feels numb, noted darker red distal foot and toes LLE Deficits / Details: not numb but noteded darker red coloration of foot, mostly distally   Cervical / Trunk Assessment Cervical / Trunk Assessment: Kyphotic   Communication Communication Communication: No difficulties   Cognition Arousal/Alertness: Awake/alert Behavior During Therapy: WFL for tasks assessed/performed Overall Cognitive Status: Within Functional Limits for tasks assessed                                     General Comments  BP 105/62, 96/62, 104/66; HR 90s.  ? 02 down to 89%--could not read line with other room data superimposed on monitor.  Mostly 100% on RA    Exercises     Shoulder Instructions      Home Living  Family/patient expects to be discharged to:: Private residence Living Arrangements: Spouse/significant other Available Help at Discharge: Family Type of Home: House Home Access: Stairs to enter Technical brewer of Steps: 1   Home Layout: Two level Alternate Level Stairs-Number of Steps: 14   Bathroom Shower/Tub: Occupational psychologist: Standard     Home Equipment: Cane - single point   Additional Comments: bed and bath upstairs      Prior Functioning/Environment Level of Independence: Independent        Comments: up until recently got to bathroom without AD.  Knees gave out just prior to admission   Couldn't get up from toilet         OT Problem List: Decreased strength;Decreased activity tolerance;Impaired balance (sitting and/or standing);Decreased knowledge of use of DME or AE      OT Treatment/Interventions: Self-care/ADL training;DME and/or AE instruction;Patient/family education;Balance training;Therapeutic activities    OT Goals(Current goals can be found in the care plan section) Acute Rehab OT Goals Patient Stated Goal: regain strength OT Goal Formulation: With patient Time For Goal Achievement: 07/09/18 Potential to Achieve Goals: Good ADL Goals Pt Will Transfer to Toilet: with min guard assist;ambulating;regular height toilet;bedside commode Pt Will Perform Toileting - Clothing Manipulation and hygiene: with min guard assist;sit to/from stand Additional ADL Goal #1: pt will perform adl with min guard for sit to stand and grooming from standing with min guard  OT Frequency: Min 2X/week   Barriers to D/C:            Co-evaluation PT/OT/SLP Co-Evaluation/Treatment: Yes Reason for Co-Treatment: For patient/therapist safety;To address functional/ADL transfers PT goals addressed during session: Mobility/safety with mobility OT goals addressed during session: ADL's and self-care      AM-PAC OT "6 Clicks" Daily Activity     Outcome Measure Help from another person eating meals?: None Help from another person taking care of personal grooming?: A Little Help from another person toileting, which includes using toliet, bedpan, or urinal?: A Lot Help from another person bathing (including washing, rinsing, drying)?: A Lot Help from another person to put on and taking off regular upper body clothing?: A Little Help from another person to put on and taking off regular lower body clothing?: A Lot 6 Click Score: 16   End of Session    Activity Tolerance: Patient limited by fatigue Patient left: in chair;with call bell/phone within reach;with chair alarm set;with family/visitor present  OT Visit Diagnosis:  Unsteadiness on feet (R26.81)                Time: 7412-8786 OT Time Calculation (min): 25 min Charges:  OT General Charges $OT Visit: 1 Visit OT Evaluation $OT Eval Low Complexity: [place], OTR/L Acute Rehabilitation Services [phone] [facility] pager [phone] office 06/25/2018  Rossie 06/25/2018, 1:08 PM

## 2018-06-25 NOTE — Progress Notes (Signed)
Randy [name] [id number] moved from chair to bed. His sat was 100 RA, dropped to 89 with the activity. He did become SOB.

## 2018-06-25 NOTE — Progress Notes (Signed)
PROGRESS NOTE    KINSER FELLMAN  FAO:[id number] DOB: 1946/04/18 DOA: 06/21/2018 PCP: [name], PA-C    Brief Narrative:  Patient is a 73 year old gentleman, medical history of abdominal aortic ectasia, coronary artery disease on Plavix, history of non-STEMI May 6962, grade 2 diastolic dysfunction, LVH, type 2 diabetes, hyperlipidemia, hypertension, history of leukocytosis, chronic constipation on Movantik and as needed MiraLAX, history of progressive stage IV non-small cell lung cancer currently not on treatment presented to the emergency room with rectal bleeding, weakness and shortness of breath.  Patient on admission noted to have a hemoglobin of 7.9 from 11.9 on 06/10/2018.  Patient transfused a unit of packed red blood cells however had ongoing rectal bleeding the night of 06/21/2018 with 7 episodes of bloody bowel movements with hemoglobin dropping down to 7.2 despite a unit of packed red blood cell transfusion.  GI was consulted and felt that patient's anemia/bleed could likely be multifactorial secondary to internal hemorrhoid hemorrhage versus rectal stercoral ulcer.  Patient to be transfused 2 more units of packed red blood cells.  GI to reassess to see if patient is a candidate for any intervention/colonoscopy.   Assessment & Plan:   Principal Problem:   GI bleed Active Problems:   Hematochezia   Hypotension   HCAP (healthcare-associated pneumonia)   Hyperlipidemia   Coronary artery disease   Leukocytosis   Diabetes mellitus without complication (HCC)   Chronic obstructive pulmonary disease (HCC)   Grade II diastolic dysfunction   Mild protein-calorie malnutrition (HCC)   Stage IV squamous cell carcinoma of right lung (HCC)   Therapeutic opioid induced constipation   Hyponatremia   Pressure injury of skin   SOB (shortness of breath)   Palliative care by specialist   Goals of care, counseling/discussion   Protein-calorie malnutrition, severe   Encounter  for palliative care  1 GIB/hematochezia secondary to presumed diverticular bleed Questionable etiology.  Presumably felt may be secondary to diverticular bleed Per flex sigmoidoscopy done on 06/22/2018 per GI.   Patient noted to have history of chronic constipation likely opioid induced on Movantik and as needed MiraLAX at home.  Prior to admission patient noted to have significant constipation 1 week prior to admission with concerns for impaction as patient had stool the size of a baseball per patient.  Concern for hemorrhoidal bleed versus steroid coral ulcer.  Patient with ongoing GI bleed with 7 bloody bowel movements that night of 06/21/2018.  On admission hemoglobin was 7.9 and patient transfused a unit packed red blood cells.  Hemoglobin went up to 9.0 then down to 8.3 and then down to 7.2 the morning of 06/22/2018.  Patient transfused 2 more units of packed red blood cells and has received a total of 3 units of packed red blood cells during this hospitalization.  Hemoglobin currently at 8.3.  No further bleeding overnight per patient and per RN.   Anemia panel was not obtained on admission and likely not valuable at this time posttransfusion. GI initially on consultation, recommended continuation of Movantik, MiraLAX twice daily and patient placed on rectal hydrocortisone suppositories twice daily.  Due to patient's ongoing bleed on 06/22/2018 patient was reassessed by Dr. Geoffry Paradise of gastroenterology and patient underwent a flex sigmoidoscopy which did show diverticulosis.  It was felt patient may likely have a diverticular bleed.  Plavix and aspirin on hold.  Hemoglobin currently at 8.3.  Follow H&H. Transfusion threshold hemoglobin less than 8.  Follow H&H.  2.  Symptomatic anemia/acute blood loss anemia  Secondary to problem #1.  Patient status post transfusion of 1 unit packed red blood cells on admission.  Patient transfused 2 units of packed red blood cells on 06/22/2018 and has received a total of 3  units of packed red blood cells..  Hemoglobin currently at 8.3 from 8.9 from 9.3 from 9.4 from 9.5 from 7.2 from 7.9 on admission.  Patient noted to have ongoing GI bleed however no further bleeding over night.  Status post flexible sigmoidoscopy per GI on 06/22/2018.  Transfusion threshold hemoglobin less than 8. GI following.  3.  HCAP Patient noted on 06/24/2018 to be hypotensive with systolic blood pressures in the 70s.  Patient also noted to have a worsening leukocytosis with white count as high as 54.1 on 06/24/2018.  Patient was pancultured.  Chest x-ray done concerning for pneumonia.  Patient initially placed empirically on IV vancomycin IV cefepime.  Check a sputum Gram stain and culture.  Check a urine Legionella antigen.  Check urine pneumococcus antigen.  MRSA PCR was negative and as such we will discontinue IV vancomycin.  Continue IV cefepime, supportive care.  4.  Hypotension Secondary to problem #1 versus infectious etiology.  Patient with a worsening leukocytosis.  Blood pressure initially improved with transfusions of packed red blood cells and saline.  IV fluids were saline locked.  Systolic blood pressures in the 70s the morning of 06/24/2018.  Patient given a normal saline bolus of 500 cc x 1.  Patient placed on IV fluids.  Blood pressure improving with systolic blood pressure this morning of 108.  Patient has been pancultured results pending.  Repeat chest x-ray concerning for pneumonia.  Hemoglobin has remained stable.  Continue IV fluids.  Continue empiric IV cefepime.  Discontinue IV vancomycin.  Follow.    5.  Hypothermia Questionable etiology.  Repeat chest x-ray consistent with a pneumonia. Urinalysis unremarkable.  Blood cultures pending.  With recent TSH of 2.0 on 06/10/2018.  Hypothermia improved with a temperature of 97.3 this morning.  Continue empiric IV antibiotics.  Follow.  Retail banker as needed.  6.  Diabetes mellitus type 2 Last hemoglobin A1c was 6.7 on 03/29/2018.  CBG  of 124 this morning.  Changed CBGs to [facility] and at bedtime.    7.  Hyponatremia Likely secondary to hypovolemic hyponatremia.  Patient presented with GI bleed.  Patient noted to have blood pressure drop as low as 78/57.  Patient noted to be tachycardic.  Status post transfusion of packed red blood cells.  Sodium levels trending back down.  Change IV fluids from half-normal saline to normal saline at 75 cc/h.  Follow.   8.  Stage IV squamous cell carcinoma of the right lung Patient with progressive disease.  Patient status post concurrent chemoradiation therapy in the past, consolidation immunotherapy, status post clinical trial with Tisotumab Vedotin at [facility] early June 2019 which was subsequently discontinued secondary to disease progression.  Patient status post trial of single agent of gemcitabine for 1 dose which was also discontinued secondary to deterioration of performance status.  Patient at that time was advised to consider palliative care and hospice but patient declined this option and looking for more treatment if possible.  Patient being followed by Dr. Lorna Few who last saw patient 06/10/2018 and felt that due to his poor performance status patient may not be a good candidate for any treatment with his current status and recommended patient to work on his nutrition for the next few weeks.  Patient subsequently started  on prednisone 10 mg twice daily in addition to Remeron and patient referred to dietitian at [place] for nutritional consultation.  Patient with a poor prognosis.  Palliative care following.  Outpatient follow-up with his oncologist Dr. Lorna Few.  9.  Dusky feet/bluish discoloration of toes concern for ischemic limbs Some concern for ischemic changes.  Patient likely with poor perfusion due to significant GI bleed.  Dopplers done and noted to have pulses on the right dorsalis pedis and tibialis and left tibialis.  Unable to obtain pulses on the  left dorsalis pedis.  Patient presented with a GI bleed and as such Plavix and aspirin on hold and difficult to anticoagulate at this time.  Patient seen in consultation by Dr. Donzetta Matters vascular surgery who recommended ABIs which were done 06/24/2018 which were unremarkable.  Patient now with feet that are warm and less blue discoloration.  As per vascular surgery doubt the patient will need any vascular surgical intervention at this time.  Per vascular surgery when stable from a bleeding standpoint to resume patient's Plavix.  Follow for now.   10.  Constipation Continue Movantik and MiraLAX twice daily.  11.  Leukocytosis Likely secondary to pneumonia noted on repeat chest x-ray from 06/24/2018.  Initially felt likely secondary to steroids as patient on prednisone 5 mg twice daily and likely progressive lung cancer..  Patient noted to be hypo-thermic.  Urinalysis unremarkable.  Blood cultures ordered and are pending.  Leukocytosis was worsening however now trending back down after patient started on empiric IV antibiotics.  Check urine Legionella antigen.  Check urine pneumococcus antigen.  Chest x-ray concerning for pneumonia.  Urinalysis unremarkable.  Lactic acid level trending down.  MRSA PCR negative.  Discontinue IV vancomycin.  Continue IV cefepime.  Follow.  12.  Hyperlipidemia Continue statin.  13.  Coronary artery disease Patient with no chest pain at this moment.  Continue to hold aspirin, Plavix and metoprolol due to acute GI bleed and hypotension.   14.  Grade 2 diastolic dysfunction Compensated.  Patient with some lower extremity edema.  Patient beta-blocker held on admission due to hypotension.  Beta-blocker was to be resumed, however patient noted to be hypotensive with systolic blood pressures in the 70s on 06/24/2018 and as such beta-blocker discontinued. Aspirin and Plavix on hold due to presentation with acute GI bleed.  Continue statin.  IV fluids have been resumed at 75 cc/h due to  hypotension.  Follow.    15 severe protein calorie malnutrition Likely secondary to progressive stage IV lung cancer.  Prealbumin 11.0.  Continue Ensure.  Status post 1 dose IV albumin.  Will place on IV albumin every 6 hours for 1 day.  Follow.    16. Stage 1 pressure injury to coccyx Wound care..  17.  Shortness of breath  Likely secondary to GI bleed/symptomatic anemia and progressive lung cancer and pneumonia noted on repeat chest x-ray from 06/24/2018.  Check ambulatory sats.  Patient started on empiric IV antibiotics.  Follow.     DVT prophylaxis: SCDs Code Status: Partial Family Communication: Updated patient, and wife and son and family at bedside.  Disposition Plan: Remain in ICU.  Once blood pressure improves and stabilizes could likely transfer to stepdown unit.. to be determined.   Consultants:   Gastroenterology: Dr. Silverio Decamp 06/21/2018  Palliative care: Dr.Anwar 06/22/2018  Vascular surgery: Dr. Donzetta Matters 06/23/2018  Procedures:   Chest x-ray 06/21/2018, 06/22/2018, 06/24/2018  1 unit packed red blood cells 06/21/2018  2 units packed red blood  cells 06/22/2018  Flexible sigmoidoscopy per [name] 06/22/2018  ABIs 06/23/2018  Antimicrobials:   IV vancomycin 06/24/2018 >>>>> 06/25/2018  IV cefepime 06/24/2018   Subjective: Patient in bed.  Alert.  Answering questions appropriately.  Denies any significant change with shortness of breath.  Denies any chest pain.  No abdominal pain.  Denies any further bloody bowel movements.  No dysuria.  No dizziness.   Objective: Vitals:   06/25/18 0400 06/25/18 0500 06/25/18 0604 06/25/18 0800  BP: (!) 116/96 103/68 94/60   Pulse: 92 94 89   Resp: (!) 22 (!) 23 19   Temp: 98 F (36.7 C)   (!) 97.3 F (36.3 C)  TempSrc: Oral   Oral  SpO2: 100% 100% 100%   Weight:      Height:        Intake/Output Summary (Last 24 hours) at 06/25/2018 1002 Last data filed at 06/25/2018 0756 Gross per 24 hour  Intake 2019.97 ml  Output 1250  ml  Net 769.97 ml   Filed Weights   06/21/18 2000  Weight: 64.7 kg    Examination:  General exam: Pale.  Cachectic.  Temporal wasting.  Chronically ill-appearing.  Respiratory system: Lungs clear to auscultation bilaterally.  No wheezes, no crackles, no rhonchi.  Speaking in full sentences.  Normal respiratory effort. Cardiovascular system: Regular rate rhythm no murmurs rubs or gallops.  No JVD.  2+ bilateral lower extremity edema.  Gastrointestinal system: Abdomen is scaphoid, nontender, nondistended, soft, positive bowel sounds.  No rebound.  No guarding.  Central nervous system: Alert and oriented. No focal neurological deficits. Extremities: 2+ bilateral lower extremity edema.  Feet are warm to touch, somewhat dusky, toes with decreasing blue discoloration.  Right great toe with blister noted on both sides. Skin: Blister noted on right great toe on both sides.  Psychiatry: Judgement and insight appear fair. Mood & affect appropriate.     Data Reviewed: I have personally reviewed following labs and imaging studies  CBC: Recent Labs  Lab 06/22/18 0509  06/23/18 0314 06/23/18 1551 06/24/18 0640 06/24/18 1306 06/24/18 1538 06/25/18 0400  WBC 37.6*  --  53.7*  --  53.9*  --  54.1* 51.5*  NEUTROABS 31.9*  --  47.0*  --  47.8*  --  47.6* 45.4*  HGB 7.2*   < > 9.4* 9.3* 8.9* 8.6* 8.6* 8.3*  HCT 22.5*   < > 28.9* 28.7* 27.7* 26.8* 27.0* 26.3*  MCV 86.5  --  87.0  --  90.5  --  90.0 90.7  PLT 195  --  152  --  120*  --  119* 117*   < > = values in this interval not displayed.   Basic Metabolic Panel: Recent Labs  Lab 06/21/18 1207 06/22/18 0509 06/23/18 0314 06/24/18 0640 06/25/18 0400  NA 125* 128* 129* 130* 129*  K 4.7 4.9 4.4 3.8 4.5  CL 89* 96* 98 98 98  CO2 26 22 21* 22 22  GLUCOSE 120* 168* 171* 196* 133*  BUN 41* 55* 63* 51* 37*  CREATININE 0.77 0.97 0.97 0.74 0.61  CALCIUM 9.7 9.1 9.4 10.2 9.5  MG  --   --  1.9 2.0  --   PHOS  --   --  3.9 2.5  --     GFR: Estimated Creatinine Clearance: 76.4 mL/min (by C-G formula based on SCr of 0.61 mg/dL). Liver Function Tests: Recent Labs  Lab 06/21/18 1207 06/22/18 0509 06/24/18 0640  AST 20 35 30  ALT  15 28 33  ALKPHOS 91 82 91  BILITOT 1.2 0.7 0.8  PROT 6.4* 5.8* 6.5  ALBUMIN 3.0* 2.6* 3.3*   Recent Labs  Lab 06/21/18 1207  LIPASE 27   No results for input(s): AMMONIA in the last 168 hours. Coagulation Profile: Recent Labs  Lab 06/21/18 1207 06/21/18 1400  INR 1.14 1.14   Cardiac Enzymes: No results for input(s): CKTOTAL, CKMB, CKMBINDEX, TROPONINI in the last 168 hours. BNP (last 3 results) No results for input(s): PROBNP in the last 8760 hours. HbA1C: No results for input(s): HGBA1C in the last 72 hours. CBG: Recent Labs  Lab 06/24/18 0825 06/24/18 1227 06/24/18 1822 06/24/18 2200 06/25/18 0739  GLUCAP 148* 169* 187* 130* 124*   Lipid Profile: No results for input(s): CHOL, HDL, LDLCALC, TRIG, CHOLHDL, LDLDIRECT in the last 72 hours. Thyroid Function Tests: No results for input(s): TSH, T4TOTAL, FREET4, T3FREE, THYROIDAB in the last 72 hours. Anemia Panel: No results for input(s): VITAMINB12, FOLATE, FERRITIN, TIBC, IRON, RETICCTPCT in the last 72 hours. Sepsis Labs: Recent Labs  Lab 06/24/18 1304 06/24/18 1306 06/25/18 0400  PROCALCITON  --  0.19 0.10  LATICACIDVEN 2.0*  --  1.5    Recent Results (from the past 240 hour(s))  Blood culture (routine x 2)     Status: None (Preliminary result)   Collection Time: 06/21/18  1:05 PM  Result Value Ref Range Status   Specimen Description   Final    BLOOD PORTA CATH Performed at [facility] [address], [place], [place] [address]    Special Requests   Final    BOTTLES DRAWN AEROBIC AND ANAEROBIC Blood Culture adequate volume Performed at [facility] [address], [place], [place] [address]    Culture   Final    NO GROWTH 3 DAYS Performed at [facility], [place] [address], [place], [place] [address]    Report Status PENDING  Incomplete  Blood culture (routine x 2)     Status: None (Preliminary result)   Collection Time: 06/21/18  1:05 PM  Result Value Ref Range Status   Specimen Description   Final    BLOOD PORT Performed at [facility] [address], [place], [place] [address]    Special Requests   Final    BOTTLES DRAWN AEROBIC AND ANAEROBIC Blood Culture adequate volume Performed at [facility] [address], [place], [place] [address]    Culture   Final    NO GROWTH 3 DAYS Performed at [facility], [place] [address], [place], [place] [address]    Report Status PENDING  Incomplete  MRSA PCR Screening     Status: None   Collection Time: 06/21/18  9:14 PM  Result Value Ref Range Status   MRSA by PCR NEGATIVE NEGATIVE Final    Comment:        The GeneXpert MRSA Assay (FDA approved for NASAL specimens only), is one component of a comprehensive MRSA colonization surveillance program. It is not intended to diagnose MRSA infection nor to guide or monitor treatment for MRSA infections. Performed at [facility], [place] [address], [place], [place] [address]          Radiology Studies: Dg Chest Port 1 View  Result Date: 06/24/2018 CLINICAL DATA:  Leukocytosis. EXAM: PORTABLE CHEST 1 VIEW COMPARISON:  06/22/2018 FINDINGS: Right chest wall port a catheter is identified with tip in the right atrium. Extensive right lung  post treatment changes with volume loss and asymmetric elevation of the right hemidiaphragm again noted. Similar appearance of perihilar masslike architectural distortion within the right lung. Asymmetric airspace opacity within the right lung base is new from the previous exam and may represent early pneumonia. The left lung appears clear. IMPRESSION: 1. New opacity within the right lung base compared with 06/22/2018 may represent a small early  pneumonia. Electronically Signed   By: Kerby Moors M.D.   On: 06/24/2018 12:03   Vas [place] Burnard Bunting With/wo Tbi  Result Date: 06/24/2018 LOWER EXTREMITY DOPPLER STUDY Indications: Discolored toes.  Performing Technologist: Carlos Levering RVT  Examination Guidelines: A complete evaluation includes at minimum, Doppler waveform signals and systolic blood pressure reading at the level of bilateral brachial, anterior tibial, and posterior tibial arteries, when vessel segments are accessible. Bilateral testing is considered an integral part of a complete examination. Photoelectric Plethysmograph (PPG) waveforms and toe systolic pressure readings are included as required and additional duplex testing as needed. Limited examinations for reoccurring indications may be performed as noted.  ABI Findings: +--------+------------------+-----+---------+--------+ Right   Rt Pressure (mmHg)IndexWaveform Comment  +--------+------------------+-----+---------+--------+ [id number]                    triphasic         +--------+------------------+-----+---------+--------+ PTA     139               1.05 biphasic          +--------+------------------+-----+---------+--------+ DP      113               0.85 biphasic          +--------+------------------+-----+---------+--------+ +--------+------------------+-----+---------+-------+ Left    Lt Pressure (mmHg)IndexWaveform Comment +--------+------------------+-----+---------+-------+ [id number]                    triphasic        +--------+------------------+-----+---------+-------+ PTA     145               1.09 biphasic         +--------+------------------+-----+---------+-------+ DP      134               1.01 biphasic         +--------+------------------+-----+---------+-------+ +-------+-----------+-----------+------------+------------+ ABI/TBIToday's ABIToday's TBIPrevious ABIPrevious TBI  +-------+-----------+-----------+------------+------------+ Right  1.05                                           +-------+-----------+-----------+------------+------------+ Left   1.09                                           +-------+-----------+-----------+------------+------------+  Summary: Right: Resting right ankle-brachial index is within normal range. No evidence of significant right lower extremity arterial disease. Left: Resting left ankle-brachial index is within normal range. No evidence of significant left lower extremity arterial disease.  *See table(s) above for measurements and observations.    Preliminary         Scheduled Meds: . acetaminophen  650 mg Oral Once  . atorvastatin  40 mg Oral Daily  . chlorhexidine  15 mL Mouth Rinse BID  . Chlorhexidine Gluconate Cloth  6 each Topical Daily  . erythromycin  1 application Right Eye QHS  . feeding supplement (ENSURE  ENLIVE)  237 mL Oral BID BM  . gatifloxacin  1 drop Right Eye QID  . hydrocortisone  25 mg Rectal BID  . mouth rinse  15 mL Mouth Rinse q12n4p  . naloxegol oxalate  25 mg Oral Daily  . pantoprazole (PROTONIX) IV  40 mg Intravenous Q24H  . polyethylene glycol  17 g Oral BID  . polyvinyl alcohol  1 drop Both Eyes QID  . predniSONE  5 mg Oral BID WC  . sodium chloride flush  10-40 mL Intracatheter Q12H   Continuous Infusions: . sodium chloride    . ceFEPime (MAXIPIME) IV Stopped (06/25/18 0428)  . vancomycin Stopped (06/25/18 0044)     LOS: 4 days    Time spent: 45 minutes    Irine Seal, MD [facility]  If 7PM-7AM, please contact night-coverage [email or web address] 06/25/2018, 10:02 AM

## 2018-06-25 NOTE — Evaluation (Addendum)
Physical Therapy Evaluation Patient Details Name: Randy [name] MRN: [id number] DOB: [birth year] Today's Date: 06/25/2018   History of Present Illness  73 yo male admitted 06/21/18  with GI bleed. H/O lung cancer, DM,  CAD, coronary Stent placed.  Clinical Impression  The patient mobilized from bed to recliner using RW and 2 mod assisy.  Patient reports right foot is numb on plantar surface when patient placed foot on the floor. Both feet are noted to be red/purplish. Blister on right great toe dorsum. RN aware. Pt admitted with above diagnosis. Pt currently with functional limitations due to the deficits listed below (see PT Problem List).  Pt will benefit from skilled PT to increase their independence and safety with mobility to allow discharge to the venue listed below.       Follow Up Recommendations SNF    Equipment Recommendations  None recommended by PT    Recommendations for Other Services       Precautions / Restrictions Precautions Precautions: Fall Precaution Comments: monito BP and sats, rR > L feet are purple, blister on Top of R gr. toe. ABI's normal Restrictions Weight Bearing Restrictions: No      Mobility  Bed Mobility Overal bed mobility: Needs Assistance Bed Mobility: Supine to Sit     Supine to sit: Mod assist;+2 for physical assistance     General bed mobility comments: assist for trunk and to scoot to EOB  Transfers Overall transfer level: Needs assistance Equipment used: Rolling walker (2 wheeled) Transfers: Sit to/from [employer] Transfers Sit to Stand: Min assist;+2 safety/equipment Stand pivot transfers: Min assist;Mod assist;+2 physical assistance       General transfer comment: unsteady with SPT  Ambulation/Gait                Stairs            Wheelchair Mobility    Modified Rankin (Stroke Patients Only)       Balance                                             Pertinent Vitals/Pain  Pain Assessment: No/denies pain    Home Living Family/patient expects to be discharged to:: Private residence Living Arrangements: Spouse/significant other Available Help at Discharge: Family Type of Home: House Home Access: Stairs to enter   Technical brewer of Steps: 1 Home Layout: Two level Home Equipment: Cane - single point Additional Comments: bed and bath upstairs    Prior Function Level of Independence: Independent         Comments: up until recently got to bathroom without AD.  Knees gave out just prior to admission   Couldn't get up from toilet     Hand Dominance        Extremity/Trunk Assessment   Upper Extremity Assessment Upper Extremity Assessment: Generalized weakness    Lower Extremity Assessment Lower Extremity Assessment: RLE deficits/detail;LLE deficits/detail RLE Deficits / Details: reports  plantar surface feels numb, noted darker red distal foot and toes LLE Deficits / Details: not numb but noteded darker red coloration of foot, mostly distally    Cervical / Trunk Assessment Cervical / Trunk Assessment: Kyphotic  Communication   Communication: No difficulties  Cognition Arousal/Alertness: Awake/alert Behavior During Therapy: WFL for tasks assessed/performed Overall Cognitive Status: Within Functional Limits for tasks assessed  General Comments General comments (skin integrity, edema, etc.): BP 105/62, 96/62, 104/66; HR 90s.  ? 02 down to 89%--could not read line with other room data superimposed on monitor.  Mostly 100% on RA    Exercises     Assessment/Plan    PT Assessment Patient needs continued PT services  PT Problem List Decreased strength;Decreased activity tolerance;Decreased mobility;Decreased knowledge of precautions;Decreased safety awareness;Decreased knowledge of use of DME;Impaired sensation       PT Treatment Interventions DME instruction;Therapeutic  exercise;Gait training;Functional mobility training;Therapeutic activities;Patient/family education    PT Goals (Current goals can be found in the Care Plan section)  Acute Rehab PT Goals Patient Stated Goal: regain strength PT Goal Formulation: With patient Time For Goal Achievement: 07/09/18 Potential to Achieve Goals: Fair    Frequency Min 2X/week   Barriers to discharge        Co-evaluation PT/OT/SLP Co-Evaluation/Treatment: Yes Reason for Co-Treatment: For patient/therapist safety;To address functional/ADL transfers PT goals addressed during session: Mobility/safety with mobility OT goals addressed during session: ADL's and self-care       AM-PAC PT "6 Clicks" Mobility  Outcome Measure Help needed turning from your back to your side while in a flat bed without using bedrails?: A Lot Help needed moving from lying on your back to sitting on the side of a flat bed without using bedrails?: A Lot Help needed moving to and from a bed to a chair (including a wheelchair)?: Total Help needed standing up from a chair using your arms (e.g., wheelchair or bedside chair)?: Total Help needed to walk in hospital room?: Total Help needed climbing 3-5 steps with a railing? : Total 6 Click Score: 8    End of Session Equipment Utilized During Treatment: Gait belt Activity Tolerance: Patient limited by fatigue Patient left: in chair;with call bell/phone within reach;with family/visitor present;with chair alarm set Nurse Communication: Mobility status PT Visit Diagnosis: Unsteadiness on feet (R26.81)    Time: 5830-9407 PT Time Calculation (min) (ACUTE ONLY): 26 min   Charges:   PT Evaluation $PT Eval Low Complexity: [place] Pager [phone] Office [phone]   Claretha Cooper 06/25/2018, 12:54 PM

## 2018-06-25 NOTE — Progress Notes (Signed)
PMT progress note  Patient is awake alert, resting in bed. Children at bedside.  We discussed that in light of multiple chronic medical problems that have worsened with this acute problem, care should be focused on interventions that are likely to allow the patient to achieve goal of getting back to home and spending time with family. I discussed with him and his family regarding heroic interventions at the end-of-life and they agree this would not be in line with prior expressed wishes for a natural death or be likely to lead to getting well enough to go back home. They were in agreement with changing CODE STATUS to DO NOT RESUSCITATE.  Discussed completion of advance directives with patient and family.  Will reach out to spiritual care to assist.  BP 125/66   Pulse 91   Temp (!) 97.4 F (36.3 C) (Oral)   Resp (!) 27   Ht 5\' 11"  (1.803 m)   Wt 64.7 kg   SpO2 100%   BMI 19.89 kg/m   Denies pain, denies shortness of breath, but overall, feels ok.   Awake alert No distress Oriented Non focal Extremities some what more warm to touch, still with dusky appearance S 1 S 2  Regular breath sounds  PPS 30%  Labs and imaging noted Procedures noted  GIB  Lung cancer.   - DNR/DNI - Will follow up with spiritual care regarding completion of HCPOA. - Continue current care.  PMT will follow prn.   40 minutes Greater than 50%  of this time was spent counseling and coordinating care related to the above assessment and plan.  Micheline Rough, MD [facility] [phone]

## 2018-06-26 DIAGNOSIS — E119 Type 2 diabetes mellitus without complications: Secondary | ICD-10-CM

## 2018-06-26 DIAGNOSIS — K254 Chronic or unspecified gastric ulcer with hemorrhage: Secondary | ICD-10-CM

## 2018-06-26 DIAGNOSIS — J410 Simple chronic bronchitis: Secondary | ICD-10-CM

## 2018-06-26 LAB — CBC WITH DIFFERENTIAL/PLATELET
Abs Immature Granulocytes: 1.38 10*3/uL — ABNORMAL HIGH (ref 0.00–0.07)
BASOS ABS: 0.1 10*3/uL (ref 0.0–0.1)
Basophils Relative: 0 %
Eosinophils Absolute: 0.1 10*3/uL (ref 0.0–0.5)
Eosinophils Relative: 0 %
HCT: 23.9 % — ABNORMAL LOW (ref 39.0–52.0)
Hemoglobin: 7.5 g/dL — ABNORMAL LOW (ref 13.0–17.0)
Immature Granulocytes: 3 %
Lymphocytes Relative: 3 %
Lymphs Abs: 1.1 10*3/uL (ref 0.7–4.0)
MCH: 29.3 pg (ref 26.0–34.0)
MCHC: 31.4 g/dL (ref 30.0–36.0)
MCV: 93.4 fL (ref 80.0–100.0)
Monocytes Absolute: 1.9 10*3/uL — ABNORMAL HIGH (ref 0.1–1.0)
Monocytes Relative: 4 %
Neutro Abs: 39.4 10*3/uL — ABNORMAL HIGH (ref 1.7–7.7)
Neutrophils Relative %: 90 %
PLATELETS: 101 10*3/uL — AB (ref 150–400)
RBC: 2.56 MIL/uL — ABNORMAL LOW (ref 4.22–5.81)
RDW: 19.4 % — ABNORMAL HIGH (ref 11.5–15.5)
WBC: 44.1 10*3/uL — ABNORMAL HIGH (ref 4.0–10.5)
nRBC: 0 % (ref 0.0–0.2)

## 2018-06-26 LAB — CULTURE, BLOOD (ROUTINE X 2)
CULTURE: NO GROWTH
Culture: NO GROWTH
Special Requests: ADEQUATE
Special Requests: ADEQUATE

## 2018-06-26 LAB — BASIC METABOLIC PANEL
Anion gap: 6 (ref 5–15)
BUN: 23 mg/dL (ref 8–23)
CO2: 24 mmol/L (ref 22–32)
Calcium: 10.4 mg/dL — ABNORMAL HIGH (ref 8.9–10.3)
Chloride: 98 mmol/L (ref 98–111)
Creatinine, Ser: 0.5 mg/dL — ABNORMAL LOW (ref 0.61–1.24)
GFR calc Af Amer: >60 mL/min (ref >60)
GFR calc non Af Amer: >60 mL/min (ref >60)
Glucose, Bld: 116 mg/dL — ABNORMAL HIGH (ref 70–99)
Potassium: 4.2 mmol/L (ref 3.5–5.1)
Sodium: 128 mmol/L — ABNORMAL LOW (ref 135–145)

## 2018-06-26 LAB — GLUCOSE, CAPILLARY
Glucose-Capillary: 103 mg/dL — ABNORMAL HIGH (ref 70–99)
Glucose-Capillary: 122 mg/dL — ABNORMAL HIGH (ref 70–99)
Glucose-Capillary: 113 mg/dL — ABNORMAL HIGH (ref 70–99)

## 2018-06-26 LAB — PREPARE RBC (CROSSMATCH)

## 2018-06-26 LAB — PROCALCITONIN: Procalcitonin: 0.11 ng/mL

## 2018-06-26 MED ORDER — SODIUM CHLORIDE 0.9% IV SOLUTION
Freq: Once | INTRAVENOUS | Status: AC
  Administered 2018-06-26: 16:00:00 via INTRAVENOUS

## 2018-06-26 NOTE — Progress Notes (Signed)
PMT progress note  Patient is awake alert, resting in bed. Children at bedside.  Discussed completion of advance directives with patient and family.  He was able to meet with spiritual care to complete HCPOA today.    Answered questions regarding living will, durable DNR, and HCPOA.  BP 123/80   Pulse (!) 109   Temp 97.9 F (36.6 C) (Oral)   Resp 20   Ht 5\' 11"  (1.803 m)   Wt 64.7 kg   SpO2 100%   BMI 19.89 kg/m   Denies pain, denies shortness of breath, but overall, feels ok.   Awake alert No distress Oriented Non focal Extremities some what more warm to touch, still with dusky appearance S 1 S 2  Regular breath sounds  PPS 30%  Labs and imaging noted Procedures noted  GIB  Lung cancer.   - DNR/DNI - Appreciate spiritual care meeting to complete HCPOA. - Continue current care.  PMT will follow prn.   20 minutes Greater than 50%  of this time was spent counseling and coordinating care related to the above assessment and plan.  Micheline Rough, MD [facility] [phone]

## 2018-06-26 NOTE — Progress Notes (Addendum)
[facility]                                                                              Patient Demographics  Randy [name], is a 73 y.o. male, DOB - 1946/01/24, HOZ:[id number]  Admit date - 06/21/2018   Admitting Physician [name], MD  Outpatient Primary MD for the patient is [name], PA-C  Outpatient specialists:   LOS - 5  days   Medical records reviewed and are as summarized below:    Chief Complaint  Patient presents with  . 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  Patient on admission noted to have a hemoglobin of 7.9 from 11.9 on 06/10/2018.  Patient transfused a unit of packed red blood cells however had ongoing rectal bleeding the night of 06/21/2018 with 7 episodes of bloody bowel movements with hemoglobin dropping down to 7.2 despite a unit of packed red blood cell transfusion.  GI was consulted and felt that patient's anemia/bleed could likely be multifactorial secondary to internal hemorrhoid hemorrhage versus rectal stercoral ulcer.  Assessment & Plan    Principal Problem: GI bleed/hematochezia secondary to presumed diverticular bleed -History of constipation likely opioid induced on Movantik and MiraLAX as needed at home.  Prior to admission patient had significant constipation with concerns for impaction, hence there was concern for hemorrhoidal bleed versus stercoral ulcer -On the night of 06/21/2018, patient had ongoing GI bleed with 7 bloody BMs, on  admission hemoglobin 7.9, received total of 3 units packed RBCs during the hospitalization -Patient underwent flexible sigmoidoscopy on 06/22/2018 which showed diverticulosis -GI, [name] recommended continuation of Movantik, MiraLAX twice daily, rectal hydrocortisone suppositories twice daily. -Aspirin and Plavix on hold, hemoglobin 7.5, will transfuse 1 unit packed RBCs.  Symptomatic anemia/acute blood loss anemia -Received total of 3 units packed RBCs till now, hemoglobin today 7.5, will transfuse 1 unit packed RBCs.  Healthcare associated pneumonia -On 06/24/2018, patient was found to be hypotensive with systolic BPs in 88B, worsening leukocytosis, high as 54.1 -Chest x-ray concerning for pneumonia, initially placed on IV vancomycin and IV cefepime. -Urine strep antigen negative -WBC is improving, 44.1 today, MRSA PCR negative, IV vancomycin discontinued on 1/28, - continue IV cefepime  Hypotension -Likely due to symptomatic anemia however concern for sepsis due to worsening leukocytosis, pneumonia, hypotension -Patient was placed on IV fluid hydration, BP borderline but stable,  Hypothermia Questionable etiology, chest x-ray consistent with pneumonia, UA unremarkable, blood cultures negative so far.  Continue IV vancomycin -TSH 2.0  Diabetes mellitus type 2 Last hemoglobin A1c 6.7 on 03/29/2018.  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 - Patient being followed by Dr. [name], last seen on 06/10/2018, felt patient may not be a good candidate for any treatment with his  current status and recommended working on nutrition. - Palliative medicine was consulted, seen by [name], goals of care addressed, DNR status  Dusky bluish coloration of the toes/feet -Concern for ischemic changes, poor perfusion due to significant GI bleed, Dopplers showed pulses on the right dorsalis pedis and tibialis, left tibialis -Patient had presented with GI bleed as such aspirin and Plavix currently on hold -ABIs done, patient seen by vascular surgery [name], recommended no role for vascular surgical intervention can follow-up on an as-needed basis.  Constipation Continue Movantik and MiraLAX twice daily  Leukocytosis Likely due to pneumonia on chest x-ray 1/27 -Continue IV antibiotics, WBC count improving, MRSA PCR negative -Continue IV cefepime  Hyperlipidemia Continue statin  CAD with grade 2 diastolic dysfunction Currently no chest pain, continue to hold aspirin and Plavix -2+ pitting edema, beta-blocker on hold due to hypotension, aspirin and Plavix on hold due to presentation with acute GI bleed   Severe protein calorie malnutrition Likely due to progressive stage IV lung CA Continue IV albumin   Stage I pressure injury to [facility] 6 Wound care consulted  Shortness of breath Likely due to symptomatic GI bleed, progressive lungs Ca pneumonia  Stage I pressure injury to coccyx Wound care consult   Code Status: *DNR DVT Prophylaxis: SCDs Family Communication: Discussed in detail with the patient, all imaging results, lab results explained to the patient and wife at the bedside   Disposition Plan: Continue monitoring today  Time Spent in minutes   35 minutes  Procedures:   Chest x-ray 06/21/2018, 06/22/2018, 06/24/2018  1 unit packed red blood cells 06/21/2018  2 units packed red blood cells 06/22/2018  Flexible sigmoidoscopy per [name] 06/22/2018  ABIs 06/23/2018  Consultants:    Gastroenterology: Dr. Silverio Decamp 06/21/2018  Palliative care:  Dr.Anwar 06/22/2018 Vascular surgery: Dr. Donzetta Matters 06/23/2018*  Antimicrobials:   Anti-infectives (From admission, onward)   Start     Dose/Rate Route Frequency Ordered Stop   06/24/18 2359  vancomycin (VANCOCIN) IVPB 750 mg/150 ml premix  Status:  Discontinued     750 mg 150 mL/hr over 60 Minutes Intravenous Every 12 hours 06/24/18 1109 06/25/18 1002   06/24/18 2000  ceFEPIme (MAXIPIME) 1 g in sodium chloride 0.9 % 100 mL IVPB     1 g 200 mL/hr over 30 Minutes Intravenous Every 8 hours 06/24/18 1059     06/24/18 1200  vancomycin (VANCOCIN) 1,250 mg in sodium chloride 0.9 % 250 mL IVPB     1,250 mg 166.7 mL/hr over 90 Minutes Intravenous  Once 06/24/18 1102 06/24/18 1423   06/24/18 1130  ceFEPIme (MAXIPIME) 2 g in sodium chloride 0.9 % 100 mL IVPB     2 g 200 mL/hr over 30 Minutes Intravenous  Once 06/24/18 1059 06/24/18 1502          Medications  Scheduled Meds: . acetaminophen  650 mg Oral Once  . atorvastatin  40 mg Oral Daily  . chlorhexidine  15 mL Mouth Rinse BID  . Chlorhexidine Gluconate Cloth  6 each Topical Daily  . erythromycin  1 application Right Eye QHS  . feeding supplement (  ENSURE ENLIVE)  237 mL Oral BID BM  . gatifloxacin  1 drop Right Eye QID  . HYDROcodone-acetaminophen  1 tablet Oral Q1400  . hydrocortisone  25 mg Rectal BID  . mouth rinse  15 mL Mouth Rinse q12n4p  . naloxegol oxalate  25 mg Oral Daily  . pantoprazole (PROTONIX) IV  40 mg Intravenous Q24H  . polyethylene glycol  17 g Oral BID  . polyvinyl alcohol  1 drop Both Eyes QID  . predniSONE  5 mg Oral BID WC  . sodium chloride flush  10-40 mL Intracatheter Q12H   Continuous Infusions: . sodium chloride Stopped (06/25/18 2002)  . ceFEPime (MAXIPIME) IV Stopped (06/26/18 0436)   PRN Meds:.acetaminophen **OR** acetaminophen, chlorpheniramine-HYDROcodone, HYDROcodone-acetaminophen, ondansetron **OR** ondansetron (ZOFRAN) IV, sodium chloride flush, traZODone      Subjective:   Randy [name]  was seen and examined today.  Wife and sister-in-law at the bedside, weak and cachectic.  No acute overnight issues.  No fevers Objective:   Vitals:   06/26/18 0426 06/26/18 0500 06/26/18 0600 06/26/18 0800  BP:  115/74 119/76   Pulse:  96 100   Resp:  19 17   Temp: (!) 96.5 F (35.8 C)   97.7 F (36.5 C)  TempSrc: Axillary   Oral  SpO2:  100% 100%   Weight:      Height:        Intake/Output Summary (Last 24 hours) at 06/26/2018 1144 Last data filed at 06/25/2018 2104 Gross per 24 hour  Intake 1034.17 ml  Output 250 ml  Net 784.17 ml     Wt Readings from Last 3 Encounters:  06/21/18 64.7 kg  06/10/18 64 kg  05/28/18 64.9 kg     Exam  General: Pale, cachectic with temporal wasting  Eyes: PERLA  HEENT:  Atraumatic, normocephalic, normal oropharynx  Cardiovascular: S1 S2 auscultated,Regular rate and rhythm.  Respiratory: Clear to auscultation bilaterally, no wheezing, rales or rhonchi  Gastrointestinal: Soft, nontender, nondistended, + bowel sounds  Ext: 2+ pitting edema, somewhat dusky feet with discoloration   Neuro: No new FND's  Musculoskeletal: No digital cyanosis, clubbing  Skin: No rashes  Psych: alert and oriented   Data Reviewed:  I have personally reviewed following labs and imaging studies  Micro Results Recent Results (from the past 240 hour(s))  Blood culture (routine x 2)     Status: None   Collection Time: 06/21/18  1:05 PM  Result Value Ref Range Status   Specimen Description   Final    BLOOD PORTA CATH Performed at [facility] [address] [place], [place] [address]    Special Requests   Final    BOTTLES DRAWN AEROBIC AND ANAEROBIC Blood Culture adequate volume Performed at [facility] [address] [place], [place] [address]    Culture   Final    NO GROWTH 5 DAYS Performed at [facility], [place] [address], [place], [place] [address]    Report Status 06/26/2018 FINAL  Final    Blood culture (routine x 2)     Status: None   Collection Time: 06/21/18  1:05 PM  Result Value Ref Range Status   Specimen Description   Final    BLOOD PORT Performed at [facility] [address], [place], [place] [address]    Special Requests   Final    BOTTLES DRAWN AEROBIC AND ANAEROBIC Blood Culture adequate volume Performed at [facility]  [address] [place], [place] [address]    Culture   Final    NO GROWTH 5 DAYS Performed at [facility], [place] [address] [place], [place] [address]    Report Status 06/26/2018 FINAL  Final  MRSA PCR Screening     Status: None   Collection Time: 06/21/18  9:14 PM  Result Value Ref Range Status   MRSA by PCR NEGATIVE NEGATIVE Final    Comment:        The GeneXpert MRSA Assay (FDA approved for NASAL specimens only), is one component of a comprehensive MRSA colonization surveillance program. It is not intended to diagnose MRSA infection nor to guide or monitor treatment for MRSA infections. Performed at [facility], [place] [address], [place], [place] [address]   Culture, blood (Routine X 2) w Reflex to ID Panel     Status: None (Preliminary result)   Collection Time: 06/24/18 10:33 AM  Result Value Ref Range Status   Specimen Description   Final    LEFT ANTECUBITAL Performed at [facility] [address], [place], [place] [address]    Special Requests   Final    BOTTLES DRAWN AEROBIC AND ANAEROBIC Blood Culture adequate volume Performed at [facility] [address] [place], [place] [address]    Culture   Final    NO GROWTH 2 DAYS Performed at [facility] [address], [place], [place] [address]    Report Status PENDING  Incomplete  Culture, blood (Routine X 2) w Reflex to ID Panel     Status: None (Preliminary result)   Collection Time: 06/24/18 11:07 AM  Result Value Ref Range Status   Specimen  Description   Final    BLOOD RIGHT HAND Performed at [facility] [address] [place], [place] [address]    Special Requests   Final    BOTTLES DRAWN AEROBIC ONLY Blood Culture adequate volume Performed at [facility] [address], [place], [place] [address]    Culture   Final    NO GROWTH 2 DAYS Performed at [facility] [address] [place], [place] [address]    Report Status PENDING  Incomplete  Culture, Urine     Status: None   Collection Time: 06/24/18  1:05 PM  Result Value Ref Range Status   Specimen Description   Final    URINE, RANDOM Performed at [facility] [address], [place], [place] [address]    Special Requests   Final    NONE Performed at [facility], [place] [address], [place], [place] [address]    Culture   Final    NO GROWTH Performed at [facility], [place] [address] [place], [place] [address]    Report Status 06/25/2018 FINAL  Final  Culture, sputum-assessment     Status: None   Collection Time: 06/25/18  9:41 PM  Result Value Ref Range Status   Specimen Description SPUTUM  Final   Special Requests NONE  Final   Sputum evaluation   Final    THIS SPECIMEN IS ACCEPTABLE FOR SPUTUM CULTURE Performed at [facility], [place] [address] [place], [place] [address]    Report Status 06/25/2018 FINAL  Final  Culture, respiratory     Status: None (Preliminary result)   Collection Time: 06/25/18  9:41 PM  Result Value Ref Range Status   Specimen Description   Final  SPUTUM Performed at [facility], [place] [address] [place], [place] [address]    Special Requests   Final    NONE Reflexed from [id number] Performed at [facility], [place] [address] [place], [place] [address]    Gram Stain   Final    ABUNDANT WBC PRESENT,BOTH PMN AND MONONUCLEAR FEW GRAM VARIABLE ROD FEW GRAM NEGATIVE RODS FEW GRAM  POSITIVE COCCI ABUNDANT SQUAMOUS EPITHELIAL CELLS PRESENT Performed at [facility], [place] [address], [place], [place] [address]    Culture PENDING  Incomplete   Report Status PENDING  Incomplete    Radiology Reports Dg Chest 2 View  Result Date: 06/21/2018 CLINICAL DATA:  History of lung cancer.  Weight loss. EXAM: CHEST - 2 VIEW COMPARISON:  March 12, 2017 FINDINGS: The heart size is normal. Right central venous line is identified in the superior vena cava. There is right perihilar and hilar mass progressed compared to prior chest x-ray of 2018 consistent with patient's known lung cancer. The left lung is clear. Nipple shadow is identified in the left lung base. The visualized skeletal structures are stable. IMPRESSION: Right perihilar and hilar mass progressed compared to prior chest x-ray of 2018 consistent with patient's known lung cancer. No focal pneumonia is noted. Electronically Signed   By: Abelardo Diesel M.D.   On: 06/21/2018 10:27   Dg Chest Port 1 View  Result Date: 06/24/2018 CLINICAL DATA:  Leukocytosis. EXAM: PORTABLE CHEST 1 VIEW COMPARISON:  06/22/2018 FINDINGS: Right chest wall port a catheter is identified with tip in the right atrium. Extensive right lung post treatment changes with volume loss and asymmetric elevation of the right hemidiaphragm again noted. Similar appearance of perihilar masslike architectural distortion within the right lung. Asymmetric airspace opacity within the right lung base is new from the previous exam and may represent early pneumonia. The left lung appears clear. IMPRESSION: 1. New opacity within the right lung base compared with 06/22/2018 may represent a small early pneumonia. Electronically Signed   By: Kerby Moors M.D.   On: 06/24/2018 12:03   Dg Chest Port 1 View  Result Date: 06/22/2018 CLINICAL DATA:  Shortness of breath this morning. History of diabetes, lung cancer, hypertension and coronary artery disease. EXAM: PORTABLE CHEST 1  VIEW COMPARISON:  Chest x-ray dated 06/21/2018. FINDINGS: Heart size and mediastinal contours are stable. Stable RIGHT perihilar opacity, corresponding to patient's known history of lung cancer, better demonstrated on earlier chest CT of 10/19/2017. Stable volume loss on the RIGHT. LEFT lung remains clear. No pleural effusion or pneumothorax seen. RIGHT-sided Port-A-Cath is stable in position with tip overlying the lower SVC/cavoatrial junction. IMPRESSION: No change compared to yesterday's chest x-ray. Known RIGHT perihilar cancer. No evidence of pneumonia or pulmonary edema. Electronically Signed   By: Franki Cabot M.D.   On: 06/22/2018 10:32   Dg Abd 2 Views  Result Date: 05/28/2018 CLINICAL DATA:  Abdominal pain and constipation EXAM: ABDOMEN - 2 VIEW COMPARISON:  PET-CT April 16, 2017 FINDINGS: Supine and upright images obtained. There is diffuse stool throughout the colon. The rectum is distended with stool. There is no appreciable bowel dilatation or air-fluid level to suggest bowel obstruction. No free air. There is scarring in the right lung base region. There are foci of aorto bi-iliac artery atherosclerosis. Bones are osteoporotic. IMPRESSION: Diffuse stool throughout colon and rectum with distention of rectum by stool. These are findings indicative of constipation. No bowel obstruction or free air. Scarring right lung base.  Aortoiliac atherosclerosis noted. Bones osteoporotic. Aortic Atherosclerosis (ICD10-I70.0). Electronically Signed   By: Lowella Grip III M.D.   On: 05/28/2018 15:23   Vas [place] Burnard Bunting With/wo Tbi  Result Date: 06/25/2018 LOWER EXTREMITY DOPPLER STUDY Indications: Discolored toes.  Performing Technologist: Carlos Levering RVT  Examination Guidelines: A complete evaluation includes at minimum, Doppler waveform signals and systolic blood pressure reading at the level of bilateral brachial, anterior tibial, and posterior tibial arteries, when vessel segments are accessible.  Bilateral testing is considered an integral part of a complete examination. Photoelectric Plethysmograph (PPG) waveforms and toe systolic pressure readings are included as required and additional duplex testing as needed. Limited examinations for reoccurring indications may be performed as noted.  ABI Findings: +--------+------------------+-----+---------+--------+ Right   Rt Pressure (mmHg)IndexWaveform Comment  +--------+------------------+-----+---------+--------+ [id number]                    triphasic         +--------+------------------+-----+---------+--------+ PTA     139               1.05 biphasic          +--------+------------------+-----+---------+--------+ DP      113               0.85 biphasic          +--------+------------------+-----+---------+--------+ +--------+------------------+-----+---------+-------+ Left    Lt Pressure (mmHg)IndexWaveform Comment +--------+------------------+-----+---------+-------+ [id number]                    triphasic        +--------+------------------+-----+---------+-------+ PTA     145               1.09 biphasic         +--------+------------------+-----+---------+-------+ DP      134               1.01 biphasic         +--------+------------------+-----+---------+-------+ +-------+-----------+-----------+------------+------------+ ABI/TBIToday's ABIToday's TBIPrevious ABIPrevious TBI +-------+-----------+-----------+------------+------------+ Right  1.05                                           +-------+-----------+-----------+------------+------------+ Left   1.09                                           +-------+-----------+-----------+------------+------------+  Summary: Right: Resting right ankle-brachial index is within normal range. No evidence of significant right lower extremity arterial disease. Left: Resting left ankle-brachial index is within normal range. No evidence of significant  left lower extremity arterial disease.  *See table(s) above for measurements and observations.  Electronically signed by Ruta Hinds MD on 06/25/2018 at 11:52:37 AM.   Final     Lab Data:  CBC: Recent Labs  Lab 06/23/18 0314  06/24/18 0640 06/24/18 1306 06/24/18 1538 06/25/18 0400 06/26/18 0348  WBC 53.7*  --  53.9*  --  54.1* 51.5* 44.1*  NEUTROABS 47.0*  --  47.8*  --  47.6* 45.4* 39.4*  HGB 9.4*   < > 8.9* 8.6* 8.6* 8.3* 7.5*  HCT 28.9*   < > 27.7* 26.8* 27.0* 26.3* 23.9*  MCV 87.0  --  90.5  --  90.0 90.7 93.4  PLT 152  --  120*  --  119* 117* 101*   < > =  values in this interval not displayed.   Basic Metabolic Panel: Recent Labs  Lab 06/22/18 0509 06/23/18 0314 06/24/18 0640 06/25/18 0400 06/26/18 0348  NA 128* 129* 130* 129* 128*  K 4.9 4.4 3.8 4.5 4.2  CL 96* 98 98 98 98  CO2 22 21* 22 22 24   GLUCOSE 168* 171* 196* 133* 116*  BUN 55* 63* 51* 37* 23  CREATININE 0.97 0.97 0.74 0.61 0.50*  CALCIUM 9.1 9.4 10.2 9.5 10.4*  MG  --  1.9 2.0  --   --   PHOS  --  3.9 2.5  --   --    GFR: Estimated Creatinine Clearance: 76.4 mL/min (A) (by C-G formula based on SCr of 0.5 mg/dL (L)). Liver Function Tests: Recent Labs  Lab 06/21/18 1207 06/22/18 0509 06/24/18 0640  AST 20 35 30  ALT 15 28 33  ALKPHOS 91 82 91  BILITOT 1.2 0.7 0.8  PROT 6.4* 5.8* 6.5  ALBUMIN 3.0* 2.6* 3.3*   Recent Labs  Lab 06/21/18 1207  LIPASE 27   No results for input(s): AMMONIA in the last 168 hours. Coagulation Profile: Recent Labs  Lab 06/21/18 1207 06/21/18 1400  INR 1.14 1.14   Cardiac Enzymes: No results for input(s): CKTOTAL, CKMB, CKMBINDEX, TROPONINI in the last 168 hours. BNP (last 3 results) No results for input(s): PROBNP in the last 8760 hours. HbA1C: No results for input(s): HGBA1C in the last 72 hours. CBG: Recent Labs  Lab 06/25/18 0739 06/25/18 1153 06/25/18 1600 06/25/18 2154 06/26/18 0745  GLUCAP 124* 101* 125* 169* 103*   Lipid Profile: No  results for input(s): CHOL, HDL, LDLCALC, TRIG, CHOLHDL, LDLDIRECT in the last 72 hours. Thyroid Function Tests: No results for input(s): TSH, T4TOTAL, FREET4, T3FREE, THYROIDAB in the last 72 hours. Anemia Panel: No results for input(s): VITAMINB12, FOLATE, FERRITIN, TIBC, IRON, RETICCTPCT in the last 72 hours. Urine analysis:    Component Value Date/Time   COLORURINE YELLOW 06/24/2018 1305   APPEARANCEUR CLEAR 06/24/2018 1305   LABSPEC 1.023 06/24/2018 1305   PHURINE 5.0 06/24/2018 1305   GLUCOSEU NEGATIVE 06/24/2018 1305   HGBUR SMALL (A) 06/24/2018 1305   BILIRUBINUR NEGATIVE 06/24/2018 1305   KETONESUR NEGATIVE 06/24/2018 1305   PROTEINUR 30 (A) 06/24/2018 1305   NITRITE NEGATIVE 06/24/2018 1305   LEUKOCYTESUR NEGATIVE 06/24/2018 1305     Ripudeep Rai M.D. [facility] 06/26/2018, 11:44 AM  Pager: [phone] Between 7am to 7pm - call Pager - [phone]  After 7pm go to [email or web address] - password [email or web address]  Call night coverage person covering after 7pm

## 2018-06-26 NOTE — Progress Notes (Signed)
Chaplain worked with Randy [name] to update advance directive.  New copy placed in chart on unit.  Randy [name] with original and copies.    [name] at bedside through visit.    Provided brief support around hospitalization and decision making.      Jerene Pitch, MDiv, [facility]

## 2018-06-27 DIAGNOSIS — K922 Gastrointestinal hemorrhage, unspecified: Secondary | ICD-10-CM

## 2018-06-27 LAB — BPAM RBC
Blood Product Expiration Date: 202002232359
ISSUE DATE / TIME: 202001291627
Unit Type and Rh: 6200

## 2018-06-27 LAB — BASIC METABOLIC PANEL
Anion gap: 7 (ref 5–15)
BUN: 25 mg/dL — ABNORMAL HIGH (ref 8–23)
CO2: 25 mmol/L (ref 22–32)
Calcium: 10.1 mg/dL (ref 8.9–10.3)
Chloride: 98 mmol/L (ref 98–111)
Creatinine, Ser: 0.55 mg/dL — ABNORMAL LOW (ref 0.61–1.24)
GFR calc Af Amer: >60 mL/min (ref >60)
GFR calc non Af Amer: >60 mL/min (ref >60)
Glucose, Bld: 144 mg/dL — ABNORMAL HIGH (ref 70–99)
Potassium: 3.9 mmol/L (ref 3.5–5.1)
SODIUM: 130 mmol/L — AB (ref 135–145)

## 2018-06-27 LAB — TYPE AND SCREEN
ABO/RH(D): A POS
Antibody Screen: NEGATIVE
Unit division: 0

## 2018-06-27 LAB — CBC
HCT: 31 % — ABNORMAL LOW (ref 39.0–52.0)
Hemoglobin: 9.8 g/dL — ABNORMAL LOW (ref 13.0–17.0)
MCH: 29.8 pg (ref 26.0–34.0)
MCHC: 31.6 g/dL (ref 30.0–36.0)
MCV: 94.2 fL (ref 80.0–100.0)
Platelets: 115 10*3/uL — ABNORMAL LOW (ref 150–400)
RBC: 3.29 MIL/uL — ABNORMAL LOW (ref 4.22–5.81)
RDW: 19 % — ABNORMAL HIGH (ref 11.5–15.5)
WBC: 52.2 10*3/uL (ref 4.0–10.5)
nRBC: 0 % (ref 0.0–0.2)

## 2018-06-27 LAB — GLUCOSE, CAPILLARY
GLUCOSE-CAPILLARY: 122 mg/dL — AB (ref 70–99)
Glucose-Capillary: 109 mg/dL — ABNORMAL HIGH (ref 70–99)
Glucose-Capillary: 157 mg/dL — ABNORMAL HIGH (ref 70–99)
Glucose-Capillary: 99 mg/dL (ref 70–99)

## 2018-06-27 LAB — LEGIONELLA PNEUMOPHILA SEROGP 1 UR AG: L. pneumophila Serogp 1 Ur Ag: NEGATIVE

## 2018-06-27 LAB — BRAIN NATRIURETIC PEPTIDE: B Natriuretic Peptide: 312.9 pg/mL — ABNORMAL HIGH (ref 0.0–100.0)

## 2018-06-27 MED ORDER — FUROSEMIDE 10 MG/ML IJ SOLN
20.0000 mg | Freq: Once | INTRAMUSCULAR | Status: AC
  Administered 2018-06-27: 20 mg via INTRAVENOUS
  Filled 2018-06-27: qty 2

## 2018-06-27 MED ORDER — DIGOXIN 0.25 MG/ML IJ SOLN
0.2500 mg | Freq: Once | INTRAMUSCULAR | Status: AC
  Administered 2018-06-27: 0.25 mg via INTRAVENOUS
  Filled 2018-06-27: qty 2

## 2018-06-27 MED ORDER — AMIODARONE HCL IN DEXTROSE 360-4.14 MG/200ML-% IV SOLN
60.0000 mg/h | INTRAVENOUS | Status: AC
  Administered 2018-06-27 (×2): 60 mg/h via INTRAVENOUS
  Filled 2018-06-27 (×2): qty 200

## 2018-06-27 MED ORDER — SODIUM CHLORIDE 0.9 % IV BOLUS
1000.0000 mL | Freq: Once | INTRAVENOUS | Status: AC
  Administered 2018-06-27: 1000 mL via INTRAVENOUS

## 2018-06-27 MED ORDER — AMIODARONE HCL IN DEXTROSE 360-4.14 MG/200ML-% IV SOLN
30.0000 mg/h | INTRAVENOUS | Status: DC
  Administered 2018-06-28 (×3): 30 mg/h via INTRAVENOUS
  Filled 2018-06-27 (×4): qty 200

## 2018-06-27 MED ORDER — AMIODARONE LOAD VIA INFUSION
150.0000 mg | Freq: Once | INTRAVENOUS | Status: AC
  Administered 2018-06-27: 150 mg via INTRAVENOUS
  Filled 2018-06-27: qty 83.34

## 2018-06-27 MED ORDER — DILTIAZEM HCL 25 MG/5ML IV SOLN
10.0000 mg | INTRAVENOUS | Status: AC
  Administered 2018-06-27: 10 mg via INTRAVENOUS
  Filled 2018-06-27: qty 5

## 2018-06-27 NOTE — Progress Notes (Signed)
Pharmacy Antibiotic Note  Randy [name] is a 73 y.o. male admitted on 06/21/2018 with rectal bleed.  Hx of leukocytosis likely due to chronic Prednisone, NSCLC- stage 4 - unable to tolerate chemo. Hypotension worsening, WBC increased, concern for sepsis, Pharmacy has been consulted for  Cefepime dosing.  Today 06/27/18 12:40 PM   Afebrile  WBC trending down  SCx with few Staph aureus but improving without vancomycin  Plan: Continue cefepime 1 g iv q8h  F/U renal function and culture results  Height: 5\' 11"  (180.3 cm) Weight: 142 lb 10.2 oz (64.7 kg) IBW/kg (Calculated) : 75.3  Temp (24hrs), Avg:97.7 F (36.5 C), Min:97.4 F (36.3 C), Max:97.9 F (36.6 C)  Recent Labs  Lab 06/22/18 0509 06/23/18 0314 06/24/18 0640 06/24/18 1304 06/24/18 1538 06/25/18 0400 06/26/18 0348  WBC 37.6* 53.7* 53.9*  --  54.1* 51.5* 44.1*  CREATININE 0.97 0.97 0.74  --   --  0.61 0.50*  LATICACIDVEN  --   --   --  2.0*  --  1.5  --     Estimated Creatinine Clearance: 76.4 mL/min (A) (by C-G formula based on SCr of 0.5 mg/dL (L)).    Allergies  Allergen Reactions  . Brilinta [Ticagrelor] Shortness Of Breath   Antimicrobials this admission: 1/27 Cefepime >>  1/27 Vanc >> 1/27  Dose adjustments this admission:  Microbiology results: 1/28 SCx: few S aurueus, pending  1/27 BCx: ngtd 1/27 UCx: ngf 1/24 BCx: ngf 1/24 MRSA PCR: negative  Thank you for allowing pharmacy to be a part of this patient's care.  Ulice Dash, PharmD Clinical Pharmacist Pager # [phone]  06/27/2018, 12:37 PM

## 2018-06-27 NOTE — Progress Notes (Signed)
Nutrition Follow-up  DOCUMENTATION CODES:   Severe malnutrition in context of chronic illness  INTERVENTION:   Continue Ensure Enlive po BID, each supplement provides 350 kcal and 20 grams of protein  NUTRITION DIAGNOSIS:   Severe Malnutrition related to cancer and cancer related treatments, chronic illness as evidenced by percent weight loss, severe fat depletion, severe muscle depletion.  Ongoing.  GOAL:   Patient will meet greater than or equal to 90% of their needs  Progressing.  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   Patient in room, son came in later during visit. Pt and pt's son, pt has been eating very well this admission. Pt very adamant he eats 3 meals a day and has been drinking Ensures daily. Pt has been provided Ensure here and he is tolerating them.   NFPE completed and findings contribute to severe malnutrition diagnosis.   Pt reports UBW of 220 lb prior to diagnosis of cancer.   Medications reviewed. Labs reviewed: CBGs: 99-122 Low Na  NUTRITION - FOCUSED PHYSICAL EXAM:    Most Recent Value  Orbital Region  Moderate depletion  Upper Arm Region  Severe depletion  Thoracic and Lumbar Region  Unable to assess  Buccal Region  Severe depletion  Temple Region  Severe depletion  Clavicle Bone Region  Severe depletion  Clavicle and Acromion Bone Region  Severe depletion  Scapular Bone Region  Severe depletion  Dorsal Hand  Severe  depletion  Patellar Region  Severe depletion  Anterior Thigh Region  Severe depletion  Posterior Calf Region  Severe depletion  Edema (RD Assessment)  Severe       Diet Order:   Diet Order            Diet regular Room service appropriate? Yes; Fluid consistency: Thin  Diet effective now              EDUCATION NEEDS:   Education needs have been addressed  Skin:  Skin Assessment: Skin Integrity Issues: Skin Integrity Issues:: Stage I Stage I: coccyx  Last BM:  1/27  Height:   Ht Readings from Last 1 Encounters:  06/21/18 5\' 11"  (1.803 m)    Weight:   Wt Readings from Last 1 Encounters:  06/21/18 64.7 kg    Ideal Body Weight:  78.2 kg  BMI:  Body mass index is 19.89 kg/m.  Estimated Nutritional Needs:   Kcal:  2000-2200  Protein:  100-110g  Fluid:  2L/day   Clayton Bibles, MS, RD, LDN [place] Dietitian Pager: [phone] After Hours Pager: [phone]

## 2018-06-27 NOTE — Progress Notes (Signed)
CRITICAL VALUE ALERT  Critical Value:  Wbc= 52.2  Date & Time Notied:  06/27/2018 @ 7262  Provider Notified: Rai   Orders Received/Actions taken: Md made aware. No new orders at this time.

## 2018-06-27 NOTE — Progress Notes (Signed)
PT Cancellation Note  Patient Details Name: Randy [name]: [id number] DOB: [birth year]   Cancelled Treatment:    Per chart review lab value (WBC) trending up way beyond activity parameters for PT activity.   Rica Koyanagi  PTA Acute  Rehabilitation Services Pager      [phone] Office      [phone]

## 2018-06-27 NOTE — Care Management Important Message (Signed)
Important Message  Patient Details  Name: Randy [name] MRN: [id number] Date of Birth: 08/12/1945   Medicare Important Message Given:  Yes    Kerin Salen 06/27/2018, 10:02 AMImportant Message  Patient Details  Name: Randy [name] MRN: [id number] Date of Birth: 24-Aug-1945   Medicare Important Message Given:  Yes    Kerin Salen 06/27/2018, 10:02 AM

## 2018-06-27 NOTE — Progress Notes (Signed)
18:30pm HR dropped down to 114. Bp 103/68. Amiodarone drip infusing. Will continue to monitor pt.    17:00pm  HR in the 170's  Medications given Cardizem 10mg  IV  Digoxin IV  NS bolus 1L Amiodarone bolus

## 2018-06-27 NOTE — Progress Notes (Signed)
Rapid Response Event Note  Overview: RRT RN called for HR in the 160's.      Initial Focused Assessment: Patient labored with breathing (o2 saturation 97% on RA) lung diminished and clear bilaterally, 12 lead EKG in progress, and HR 168.    Interventions: applied 2 L [place], paged MD and order to administer cardiziem and transfer to SDU.   Plan of Care (if not transferred): transfer to room 1232.    Jaynie Bream

## 2018-06-27 NOTE — Progress Notes (Signed)
Cardiac monitoring called said that patient HR was in the 180's. This RN went into rm patient was getting off bedpan. Shortness of breath noted. Vital signs taken  HR was 160's. EKG performed. Rapid response RN at bedside. Pt HR remains elevated. MD paged. Verbal order for Cardizem 10mg  IV was given and to be transferred to ICU.

## 2018-06-27 NOTE — Progress Notes (Addendum)
[facility]                                                                              Patient Demographics  Randy [name], is a 73 y.o. male, DOB - [birth year], FFM:[id number]  Admit date - 06/21/2018   Admitting Physician [name], MD  Outpatient Primary MD for the patient is [name], PA-C  Outpatient specialists:   LOS - 6  days   Medical records reviewed and are as summarized below:    Chief Complaint  Patient presents with  . 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  Patient on admission noted to have a hemoglobin of 7.9 from 11.9 on 06/10/2018.  Patient transfused a unit of packed red blood cells however had ongoing rectal bleeding the night of 06/21/2018 with 7 episodes of bloody bowel movements with hemoglobin dropping down to 7.2 despite a unit of packed red blood cell transfusion.  GI was consulted and felt that patient's anemia/bleed could likely be multifactorial secondary to internal hemorrhoid hemorrhage versus rectal stercoral ulcer.  Assessment & Plan    Principal Problem: GI bleed/hematochezia secondary to presumed diverticular bleed -History of constipation likely opioid induced on Movantik and MiraLAX as needed at home.  Prior to admission patient had significant constipation with concerns for impaction, hence there was concern for hemorrhoidal bleed versus stercoral ulcer -On the night of 06/21/2018, patient had ongoing GI bleed with 7 bloody BMs, on  admission hemoglobin 7.9, received total of 3 units packed RBCs during the hospitalization -Patient underwent flexible sigmoidoscopy on 06/22/2018 which showed diverticulosis -GI, [name] recommended continuation of Movantik, MiraLAX twice daily, rectal hydrocortisone suppositories twice daily. -Aspirin and Plavix on hold, received 1 packed RBC transfusion on 1/29, CBC pending today   Symptomatic anemia/acute blood loss anemia -Received total of 4 units packed RBCs till now -CBC pending today  Healthcare associated pneumonia -On 06/24/2018, patient was found to be hypotensive with systolic BPs in 77L, worsening leukocytosis, high as 54.1 -Chest x-ray concerning for pneumonia, initially placed on IV vancomycin and IV cefepime. -Urine strep antigen negative -CBC pending today, continue IV cefepime.  IV vancomycin was discontinued on 1/28  -DC IV fluids, obtain BNP -I's and O's with 6.8 L positive, given Lasix 20 mg IV x1  Hypotension -Likely due to symptomatic anemia however concern for sepsis due to worsening leukocytosis, pneumonia, hypotension -BP currently stable, DC IV fluids  Hypothermia -Resolved likely due to sepsis, pneumonia -TSH 2.0  Diabetes mellitus type 2 Last hemoglobin A1c 6.7 on 03/29/2018.  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 - Patient being followed by Dr. [name], last seen on 06/10/2018, felt patient may not be a good candidate for any treatment with his current status and recommended working on nutrition. - Palliative medicine was consulted, seen by [name], goals of  care addressed, DNR status  Dusky bluish coloration of the toes/feet -Concern for ischemic changes, poor perfusion due to significant GI bleed, Dopplers showed pulses on the right dorsalis pedis and tibialis, left tibialis -Patient had presented with GI bleed as such aspirin and Plavix currently on hold -ABIs done, patient seen by vascular surgery [name], recommended no role for vascular surgical intervention can follow-up on an as-needed basis.  Constipation Continue Movantik and MiraLAX twice daily  Leukocytosis Likely due to pneumonia on chest x-ray 1/27 - WBC count improving, MRSA PCR negative -Continue IV cefepime  Hyperlipidemia Continue statin  CAD with grade 2 diastolic dysfunction Currently no chest pain, continue to hold aspirin and Plavix -2+ pitting edema, beta-blocker on hold due to hypotension, aspirin and Plavix on hold due to presentation with acute GI bleed -Lasix 20 mg IV x1, BNP pending   Severe protein calorie malnutrition Likely due to progressive stage IV lung CA Patient received IV albumin during this hospitalization, albumin level 3.3   Stage I pressure injury to [facility] 6 Wound care consulted  Shortness of breath Likely due to symptomatic GI bleed, progressive lungs Ca pneumonia  Stage I pressure injury to coccyx Wound care consult  ADDENDUM: 5:00PM Was called by RN for tachycardia, heart rate in 170s EKG showed atrial flutter with 2:1 AV conduction, on exam, -On exam, patient symptomatic with palpitations, shortness of breath -Gave Cardizem 10 mg IV x1, HR briefly came down into 130s and back up 160s- 170s -BP 91/67, ordered digoxin 0.25 mg IV x1 -Discussed with cardiology for consult, Dr. Radford Pax.  Recommended to give IV fluid bolus to bring BP up and then start amiodarone, slow bolus -Cardiology will follow. -Patient has not anticoagulation candidate due to GI bleed -Son at the bedside, updated in detail    Code Status: *DNR DVT  Prophylaxis: SCDs Family Communication: Discussed in detail with the patient, all imaging results, lab results explained to the patient and son at the bedside   Disposition Plan: Long conversation with patient son at the bedside, updated in detail, goals of care addressed, patient hoping to be discharged to home with home health if possible.  PT OT evaluation needed.  Time Spent in minutes   35 minutes  Procedures:   Chest x-ray 06/21/2018, 06/22/2018, 06/24/2018  1 unit packed red blood cells 06/21/2018  2 units packed red blood cells 06/22/2018  Flexible sigmoidoscopy per [name] 06/22/2018  ABIs 06/23/2018  Consultants:    Gastroenterology: Dr. Silverio Decamp 06/21/2018  Palliative care: Dr.Anwar 06/22/2018 Vascular surgery: Dr. Donzetta Matters 06/23/2018*  Antimicrobials:   Anti-infectives (From admission, onward)   Start     Dose/Rate Route Frequency Ordered Stop   06/24/18 2359  vancomycin (VANCOCIN) IVPB 750 mg/150 ml premix  Status:  Discontinued     750 mg 150 mL/hr over 60 Minutes Intravenous Every 12 hours 06/24/18 1109 06/25/18 1002   06/24/18 2000  ceFEPIme (MAXIPIME) 1 g in sodium chloride 0.9 % 100 mL IVPB     1 g 200 mL/hr over 30 Minutes Intravenous Every 8 hours 06/24/18 1059  06/24/18 1200  vancomycin (VANCOCIN) 1,250 mg in sodium chloride 0.9 % 250 mL IVPB     1,250 mg 166.7 mL/hr over 90 Minutes Intravenous  Once 06/24/18 1102 06/24/18 1423   06/24/18 1130  ceFEPIme (MAXIPIME) 2 g in sodium chloride 0.9 % 100 mL IVPB     2 g 200 mL/hr over 30 Minutes Intravenous  Once 06/24/18 1059 06/24/18 1502         Medications  Scheduled Meds: . acetaminophen  650 mg Oral Once  . atorvastatin  40 mg Oral Daily  . chlorhexidine  15 mL Mouth Rinse BID  . Chlorhexidine Gluconate Cloth  6 each Topical Daily  . erythromycin  1 application Right Eye QHS  . feeding supplement (ENSURE ENLIVE)  237 mL Oral BID BM  . gatifloxacin  1 drop Right Eye QID  . HYDROcodone-acetaminophen   1 tablet Oral Q1400  . hydrocortisone  25 mg Rectal BID  . mouth rinse  15 mL Mouth Rinse q12n4p  . naloxegol oxalate  25 mg Oral Daily  . pantoprazole (PROTONIX) IV  40 mg Intravenous Q24H  . polyethylene glycol  17 g Oral BID  . polyvinyl alcohol  1 drop Both Eyes QID  . predniSONE  5 mg Oral BID WC  . sodium chloride flush  10-40 mL Intracatheter Q12H   Continuous Infusions: . ceFEPime (MAXIPIME) IV 1 g (06/27/18 1124)   PRN Meds:.acetaminophen **OR** acetaminophen, chlorpheniramine-HYDROcodone, HYDROcodone-acetaminophen, ondansetron **OR** ondansetron (ZOFRAN) IV, sodium chloride flush, traZODone      Subjective:   Randy [name] was seen and examined today.  Feels better today, much more alert and awake, afebrile.  Still very weak.  Denies nausea, vomiting, abdominal pain, any new weakness.  Still has significant lower extremity edema   Objective:   Vitals:   06/26/18 1930 06/26/18 2017 06/27/18 0532 06/27/18 0947  BP: 116/74 123/81 125/83 127/84  Pulse: (!) 113 (!) 109 (!) 109 (!) 117  Resp:  17 15 18   Temp: 97.8 F (36.6 C) (!) 97.5 F (36.4 C) 97.8 F (36.6 C) (!) 97.4 F (36.3 C)  TempSrc:  Oral Oral Oral  SpO2: 97% 100% 95% 98%  Weight:      Height:        Intake/Output Summary (Last 24 hours) at 06/27/2018 1303 Last data filed at 06/27/2018 1200 Gross per 24 hour  Intake 1226.58 ml  Output 400 ml  Net 826.58 ml     Wt Readings from Last 3 Encounters:  06/21/18 64.7 kg  06/10/18 64 kg  05/28/18 64.9 kg    Physical Exam  General: Alert and oriented x 3, NAD  Eyes:  HEENT:  Atraumatic, normocephalic  Cardiovascular: S1 S2 clear, RRR.  2+ pedal edema b/l  Respiratory: Decreased breath sound at the bases  Gastrointestinal: Soft, nontender, nondistended, NBS  Ext: 2+ pedal edema bilaterally  Neuro: no new deficits  Musculoskeletal: No cyanosis, clubbing  Skin: No rashes  Psych: Normal affect and demeanor, alert and oriented x3       Data Reviewed:  I have personally reviewed following labs and imaging studies  Micro Results Recent Results (from the past 240 hour(s))  Blood culture (routine x 2)     Status: None   Collection Time: 06/21/18  1:05 PM  Result Value Ref Range Status   Specimen Description   Final    BLOOD PORTA CATH Performed at [facility] [address] [place], [place] [address]    Special Requests  Final    BOTTLES DRAWN AEROBIC AND ANAEROBIC Blood Culture adequate volume Performed at [facility] [address], [place], [place] [address]    Culture   Final    NO GROWTH 5 DAYS Performed at [facility], [place] [address] [place], [place] [address]    Report Status 06/26/2018 FINAL  Final  Blood culture (routine x 2)     Status: None   Collection Time: 06/21/18  1:05 PM  Result Value Ref Range Status   Specimen Description   Final    BLOOD PORT Performed at [facility] [address] [place], [place] [address]    Special Requests   Final    BOTTLES DRAWN AEROBIC AND ANAEROBIC Blood Culture adequate volume Performed at [facility] [address], [place], [place] [address]    Culture   Final    NO GROWTH 5 DAYS Performed at [facility], [place] [address], [place], [place] [address]    Report Status 06/26/2018 FINAL  Final  MRSA PCR Screening     Status: None   Collection Time: 06/21/18  9:14 PM  Result Value Ref Range Status   MRSA by PCR NEGATIVE NEGATIVE Final    Comment:        The GeneXpert MRSA Assay (FDA approved for NASAL specimens only), is one component of a comprehensive MRSA colonization surveillance program. It is not intended to diagnose MRSA infection nor to guide or monitor treatment for MRSA infections. Performed at [facility], [place] [address] [place], [place] [address]   Culture, blood (Routine X 2) w Reflex to ID Panel     Status: None  (Preliminary result)   Collection Time: 06/24/18 10:33 AM  Result Value Ref Range Status   Specimen Description   Final    LEFT ANTECUBITAL Performed at [facility] [address], [place], [place] [address]    Special Requests   Final    BOTTLES DRAWN AEROBIC AND ANAEROBIC Blood Culture adequate volume Performed at [facility] [address], [place], [place] [address]    Culture   Final    NO GROWTH 3 DAYS Performed at [facility], [place] [address], [place], [place] [address]    Report Status PENDING  Incomplete  Culture, blood (Routine X 2) w Reflex to ID Panel     Status: None (Preliminary result)   Collection Time: 06/24/18 11:07 AM  Result Value Ref Range Status   Specimen Description   Final    BLOOD RIGHT HAND Performed at [facility] [address] [place], [place] [address]    Special Requests   Final    BOTTLES DRAWN AEROBIC ONLY Blood Culture adequate volume Performed at [facility] [address], [place], [place] [address]    Culture   Final    NO GROWTH 3 DAYS Performed at [facility], [place] [address], [place], [place] [address]    Report Status PENDING  Incomplete  Culture, Urine     Status: None   Collection Time: 06/24/18  1:05 PM  Result Value Ref Range Status   Specimen Description   Final    URINE, RANDOM Performed at [facility] [address], [place], [place] [address]    Special Requests   Final    NONE Performed at [facility], [place] [address] [place], [place] [address]  Culture   Final    NO GROWTH Performed at [facility], [place] [address], [place], [place] [address]    Report Status 06/25/2018 FINAL  Final  Culture, sputum-assessment     Status: None   Collection Time: 06/25/18  9:41 PM  Result Value Ref Range Status   Specimen Description SPUTUM  Final   Special Requests NONE  Final   Sputum  evaluation   Final    THIS SPECIMEN IS ACCEPTABLE FOR SPUTUM CULTURE Performed at [facility], [place] [address] [place], [place] [address]    Report Status 06/25/2018 FINAL  Final  Culture, respiratory     Status: None (Preliminary result)   Collection Time: 06/25/18  9:41 PM  Result Value Ref Range Status   Specimen Description   Final    SPUTUM Performed at [facility] [address], [place], [place] [address]    Special Requests   Final    NONE Reflexed from [id number] Performed at [facility], [place] [address], [place], [place] [address]    Gram Stain   Final    ABUNDANT WBC PRESENT,BOTH PMN AND MONONUCLEAR FEW GRAM VARIABLE ROD FEW GRAM NEGATIVE RODS FEW GRAM POSITIVE COCCI ABUNDANT SQUAMOUS EPITHELIAL CELLS PRESENT Performed at [facility], [place] [address], [place], [place] [address]    Culture FEW STAPHYLOCOCCUS AUREUS  Final   Report Status PENDING  Incomplete    Radiology Reports Dg Chest 2 View  Result Date: 06/21/2018 CLINICAL DATA:  History of lung cancer.  Weight loss. EXAM: CHEST - 2 VIEW COMPARISON:  March 12, 2017 FINDINGS: The heart size is normal. Right central venous line is identified in the superior vena cava. There is right perihilar and hilar mass progressed compared to prior chest x-ray of 2018 consistent with patient's known lung cancer. The left lung is clear. Nipple shadow is identified in the left lung base. The visualized skeletal structures are stable. IMPRESSION: Right perihilar and hilar mass progressed compared to prior chest x-ray of 2018 consistent with patient's known lung cancer. No focal pneumonia is noted. Electronically Signed   By: Abelardo Diesel M.D.   On: 06/21/2018 10:27   Dg Chest Port 1 View  Result Date: 06/24/2018 CLINICAL DATA:  Leukocytosis. EXAM: PORTABLE CHEST 1 VIEW COMPARISON:  06/22/2018 FINDINGS: Right chest wall port a catheter is identified with tip in the right  atrium. Extensive right lung post treatment changes with volume loss and asymmetric elevation of the right hemidiaphragm again noted. Similar appearance of perihilar masslike architectural distortion within the right lung. Asymmetric airspace opacity within the right lung base is new from the previous exam and may represent early pneumonia. The left lung appears clear. IMPRESSION: 1. New opacity within the right lung base compared with 06/22/2018 may represent a small early pneumonia. Electronically Signed   By: Kerby Moors M.D.   On: 06/24/2018 12:03   Dg Chest Port 1 View  Result Date: 06/22/2018 CLINICAL DATA:  Shortness of breath this morning. History of diabetes, lung cancer, hypertension and coronary artery disease. EXAM: PORTABLE CHEST 1 VIEW COMPARISON:  Chest x-ray dated 06/21/2018. FINDINGS: Heart size and mediastinal contours are stable. Stable RIGHT perihilar opacity, corresponding to patient's known history of lung cancer, better demonstrated on earlier chest CT of 10/19/2017. Stable volume loss on the RIGHT. LEFT lung remains clear. No pleural effusion or pneumothorax seen. RIGHT-sided Port-A-Cath is stable in position with tip overlying the lower SVC/cavoatrial junction. IMPRESSION:  No change compared to yesterday's chest x-ray. Known RIGHT perihilar cancer. No evidence of pneumonia or pulmonary edema. Electronically Signed   By: Franki Cabot M.D.   On: 06/22/2018 10:32   Dg Abd 2 Views  Result Date: 05/28/2018 CLINICAL DATA:  Abdominal pain and constipation EXAM: ABDOMEN - 2 VIEW COMPARISON:  PET-CT April 16, 2017 FINDINGS: Supine and upright images obtained. There is diffuse stool throughout the colon. The rectum is distended with stool. There is no appreciable bowel dilatation or air-fluid level to suggest bowel obstruction. No free air. There is scarring in the right lung base region. There are foci of aorto bi-iliac artery atherosclerosis. Bones are osteoporotic. IMPRESSION:  Diffuse stool throughout colon and rectum with distention of rectum by stool. These are findings indicative of constipation. No bowel obstruction or free air. Scarring right lung base. Aortoiliac atherosclerosis noted. Bones osteoporotic. Aortic Atherosclerosis (ICD10-I70.0). Electronically Signed   By: Lowella Grip III M.D.   On: 05/28/2018 15:23   Vas [place] Burnard Bunting With/wo Tbi  Result Date: 06/25/2018 LOWER EXTREMITY DOPPLER STUDY Indications: Discolored toes.  Performing Technologist: Carlos Levering RVT  Examination Guidelines: A complete evaluation includes at minimum, Doppler waveform signals and systolic blood pressure reading at the level of bilateral brachial, anterior tibial, and posterior tibial arteries, when vessel segments are accessible. Bilateral testing is considered an integral part of a complete examination. Photoelectric Plethysmograph (PPG) waveforms and toe systolic pressure readings are included as required and additional duplex testing as needed. Limited examinations for reoccurring indications may be performed as noted.  ABI Findings: +--------+------------------+-----+---------+--------+ Right   Rt Pressure (mmHg)IndexWaveform Comment  +--------+------------------+-----+---------+--------+ [id number]                    triphasic         +--------+------------------+-----+---------+--------+ PTA     139               1.05 biphasic          +--------+------------------+-----+---------+--------+ DP      113               0.85 biphasic          +--------+------------------+-----+---------+--------+ +--------+------------------+-----+---------+-------+ Left    Lt Pressure (mmHg)IndexWaveform Comment +--------+------------------+-----+---------+-------+ [id number]                    triphasic        +--------+------------------+-----+---------+-------+ PTA     145               1.09 biphasic         +--------+------------------+-----+---------+-------+  DP      134               1.01 biphasic         +--------+------------------+-----+---------+-------+ +-------+-----------+-----------+------------+------------+ ABI/TBIToday's ABIToday's TBIPrevious ABIPrevious TBI +-------+-----------+-----------+------------+------------+ Right  1.05                                           +-------+-----------+-----------+------------+------------+ Left   1.09                                           +-------+-----------+-----------+------------+------------+  Summary: Right: Resting right ankle-brachial index is within normal range. No evidence of significant right lower extremity arterial disease. Left: Resting left  ankle-brachial index is within normal range. No evidence of significant left lower extremity arterial disease.  *See table(s) above for measurements and observations.  Electronically signed by Ruta Hinds MD on 06/25/2018 at 11:52:37 AM.   Final     Lab Data:  CBC: Recent Labs  Lab 06/23/18 0314  06/24/18 0640 06/24/18 1306 06/24/18 1538 06/25/18 0400 06/26/18 0348  WBC 53.7*  --  53.9*  --  54.1* 51.5* 44.1*  NEUTROABS 47.0*  --  47.8*  --  47.6* 45.4* 39.4*  HGB 9.4*   < > 8.9* 8.6* 8.6* 8.3* 7.5*  HCT 28.9*   < > 27.7* 26.8* 27.0* 26.3* 23.9*  MCV 87.0  --  90.5  --  90.0 90.7 93.4  PLT 152  --  120*  --  119* 117* 101*   < > = values in this interval not displayed.   Basic Metabolic Panel: Recent Labs  Lab 06/22/18 0509 06/23/18 0314 06/24/18 0640 06/25/18 0400 06/26/18 0348  NA 128* 129* 130* 129* 128*  K 4.9 4.4 3.8 4.5 4.2  CL 96* 98 98 98 98  CO2 22 21* 22 22 24   GLUCOSE 168* 171* 196* 133* 116*  BUN 55* 63* 51* 37* 23  CREATININE 0.97 0.97 0.74 0.61 0.50*  CALCIUM 9.1 9.4 10.2 9.5 10.4*  MG  --  1.9 2.0  --   --   PHOS  --  3.9 2.5  --   --    GFR: Estimated Creatinine Clearance: 76.4 mL/min (A) (by C-G formula based on SCr of 0.5 mg/dL (L)). Liver Function Tests: Recent Labs  Lab  06/21/18 1207 06/22/18 0509 06/24/18 0640  AST 20 35 30  ALT 15 28 33  ALKPHOS 91 82 91  BILITOT 1.2 0.7 0.8  PROT 6.4* 5.8* 6.5  ALBUMIN 3.0* 2.6* 3.3*   Recent Labs  Lab 06/21/18 1207  LIPASE 27   No results for input(s): AMMONIA in the last 168 hours. Coagulation Profile: Recent Labs  Lab 06/21/18 1207 06/21/18 1400  INR 1.14 1.14   Cardiac Enzymes: No results for input(s): CKTOTAL, CKMB, CKMBINDEX, TROPONINI in the last 168 hours. BNP (last 3 results) No results for input(s): PROBNP in the last 8760 hours. HbA1C: No results for input(s): HGBA1C in the last 72 hours. CBG: Recent Labs  Lab 06/26/18 1146 06/26/18 1653 06/27/18 0036 06/27/18 0743 06/27/18 1129  GLUCAP 122* 113* 109* 99 122*   Lipid Profile: No results for input(s): CHOL, HDL, LDLCALC, TRIG, CHOLHDL, LDLDIRECT in the last 72 hours. Thyroid Function Tests: No results for input(s): TSH, T4TOTAL, FREET4, T3FREE, THYROIDAB in the last 72 hours. Anemia Panel: No results for input(s): VITAMINB12, FOLATE, FERRITIN, TIBC, IRON, RETICCTPCT in the last 72 hours. Urine analysis:    Component Value Date/Time   COLORURINE YELLOW 06/24/2018 1305   APPEARANCEUR CLEAR 06/24/2018 1305   LABSPEC 1.023 06/24/2018 1305   PHURINE 5.0 06/24/2018 1305   GLUCOSEU NEGATIVE 06/24/2018 1305   HGBUR SMALL (A) 06/24/2018 1305   BILIRUBINUR NEGATIVE 06/24/2018 1305   KETONESUR NEGATIVE 06/24/2018 1305   PROTEINUR 30 (A) 06/24/2018 1305   NITRITE NEGATIVE 06/24/2018 1305   LEUKOCYTESUR NEGATIVE 06/24/2018 1305     Ripudeep Rai M.D. [facility] 06/27/2018, 1:03 PM  Pager: [phone] Between 7am to 7pm - call Pager - [phone]  After 7pm go to [email or web address] - password [email or web address]  Call night coverage person covering after 7pm

## 2018-06-28 ENCOUNTER — Encounter (HOSPITAL_COMMUNITY): Payer: Self-pay | Admitting: Cardiology

## 2018-06-28 DIAGNOSIS — J411 Mucopurulent chronic bronchitis: Secondary | ICD-10-CM

## 2018-06-28 DIAGNOSIS — I4892 Unspecified atrial flutter: Secondary | ICD-10-CM

## 2018-06-28 DIAGNOSIS — I951 Orthostatic hypotension: Secondary | ICD-10-CM

## 2018-06-28 DIAGNOSIS — E7800 Pure hypercholesterolemia, unspecified: Secondary | ICD-10-CM

## 2018-06-28 DIAGNOSIS — I2510 Atherosclerotic heart disease of native coronary artery without angina pectoris: Secondary | ICD-10-CM

## 2018-06-28 LAB — CBC
HCT: 27 % — ABNORMAL LOW (ref 39.0–52.0)
HEMOGLOBIN: 8.6 g/dL — AB (ref 13.0–17.0)
MCH: 29.9 pg (ref 26.0–34.0)
MCHC: 31.9 g/dL (ref 30.0–36.0)
MCV: 93.8 fL (ref 80.0–100.0)
NRBC: 0 % (ref 0.0–0.2)
Platelets: 108 10*3/uL — ABNORMAL LOW (ref 150–400)
RBC: 2.88 MIL/uL — ABNORMAL LOW (ref 4.22–5.81)
RDW: 18.8 % — ABNORMAL HIGH (ref 11.5–15.5)
WBC: 47.5 10*3/uL — ABNORMAL HIGH (ref 4.0–10.5)

## 2018-06-28 LAB — CULTURE, RESPIRATORY

## 2018-06-28 LAB — GLUCOSE, CAPILLARY
Glucose-Capillary: 118 mg/dL — ABNORMAL HIGH (ref 70–99)
Glucose-Capillary: 141 mg/dL — ABNORMAL HIGH (ref 70–99)
Glucose-Capillary: 128 mg/dL — ABNORMAL HIGH (ref 70–99)
Glucose-Capillary: 130 mg/dL — ABNORMAL HIGH (ref 70–99)

## 2018-06-28 LAB — CULTURE, RESPIRATORY W GRAM STAIN

## 2018-06-28 LAB — BASIC METABOLIC PANEL
ANION GAP: 7 (ref 5–15)
BUN: 29 mg/dL — ABNORMAL HIGH (ref 8–23)
CO2: 24 mmol/L (ref 22–32)
Calcium: 9.3 mg/dL (ref 8.9–10.3)
Chloride: 99 mmol/L (ref 98–111)
Creatinine, Ser: 0.54 mg/dL — ABNORMAL LOW (ref 0.61–1.24)
GFR calc Af Amer: >60 mL/min (ref >60)
GFR calc non Af Amer: >60 mL/min (ref >60)
Glucose, Bld: 132 mg/dL — ABNORMAL HIGH (ref 70–99)
POTASSIUM: 4.2 mmol/L (ref 3.5–5.1)
Sodium: 130 mmol/L — ABNORMAL LOW (ref 135–145)

## 2018-06-28 LAB — MAGNESIUM: Magnesium: 1.9 mg/dL (ref 1.7–2.4)

## 2018-06-28 MED ORDER — POLYETHYLENE GLYCOL 3350 17 G PO PACK: 17.0000 g | PACK | Freq: Every day | ORAL | Status: DC | PRN

## 2018-06-28 MED ORDER — SODIUM CHLORIDE 0.9 % IV SOLN
INTRAVENOUS | Status: DC | PRN
  Administered 2018-06-28: 1000 mL via INTRAVENOUS

## 2018-06-28 MED ORDER — PANTOPRAZOLE SODIUM 40 MG PO TBEC
40.0000 mg | DELAYED_RELEASE_TABLET | Freq: Every day | ORAL | Status: DC
  Administered 2018-06-28 – 2018-07-02 (×5): 40 mg via ORAL
  Filled 2018-06-28 (×5): qty 1

## 2018-06-28 NOTE — Progress Notes (Signed)
OT Cancellation Note  Patient Details Name: RIEN MARLAND MRN: [id number] DOB: 06-20-1945   Cancelled Treatment:    Reason Eval/Treat Not Completed: Medical issues which prohibited therapy.  Pt on pressors (30 mg). Will check back another day.  Adrie Picking 06/28/2018, 7:53 AM  Lesle Chris, OTR/L Acute Rehabilitation Services [phone] [facility] pager [phone] office 06/28/2018

## 2018-06-28 NOTE — Progress Notes (Signed)
[facility]                                                                              Patient Demographics  Randy [name], is a 73 y.o. male, DOB - 10/15/1945, IHK:[id number]  Admit date - 06/21/2018   Admitting Physician [name], MD  Outpatient Primary MD for the patient is Randy Dredge, PA-C  Outpatient specialists:   LOS - 7  days   Medical records reviewed and are as summarized below:    Chief Complaint  Patient presents with  . 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  Patient on admission noted to have a hemoglobin of 7.9 from 11.9 on 06/10/2018.  Patient transfused a unit of packed red blood cells however had ongoing rectal bleeding the night of 06/21/2018 with 7 episodes of bloody bowel movements with hemoglobin dropping down to 7.2 despite a unit of packed red blood cell transfusion.  GI was consulted and felt that patient's anemia/bleed could likely be multifactorial secondary to internal hemorrhoid hemorrhage versus rectal stercoral ulcer.  Assessment & Plan    Principal Problem: GI bleed/hematochezia secondary to presumed diverticular bleed -History of constipation likely opioid induced on Movantik and MiraLAX as needed at home.  Prior to admission patient had significant constipation with concerns for impaction, hence there was concern for hemorrhoidal bleed versus stercoral ulcer -On the night of 06/21/2018, patient had ongoing GI bleed with 7 bloody BMs, on  admission hemoglobin 7.9, received total of 3 units packed RBCs during the hospitalization -Patient underwent flexible sigmoidoscopy on 06/22/2018 which showed diverticulosis -GI, [name] recommended continuation of Movantik, MiraLAX twice daily, rectal hydrocortisone suppositories twice daily. -Aspirin and Plavix on hold, received 1 packed RBC transfusion on 1/29, H&H currently stable   Atrial flutter with RVR and 2:1 block, new -On 06/27/2018 evening, patient went into rapid atrial flutter, given Cardizem 10 mg IV x1, with mild improvement in heart rate briefly however became hypotensive, digoxin 0.25 mg IV x1 with no effect -Patient was then placed on amiodarone bolus, infusion, overnight converted to NSR -No anticoagulation due to GI bleed, cardiology following   Symptomatic anemia/acute blood loss anemia -Received total of 4 units packed RBCs till now -H&H currently stable  Healthcare associated pneumonia -On 06/24/2018, patient was found to be hypotensive with systolic BPs in 33I, worsening leukocytosis, high as 54.1 -Chest x-ray was concerning for pneumonia, initially placed on IV vancomycin and IV cefepime. -Urine strep antigen negative  Hypotension -Likely due to symptomatic anemia however concern for sepsis due to worsening leukocytosis, pneumonia, hypotension.  BP currently stable  Hypothermia -Resolved likely due to sepsis, pneumonia -TSH 2.0  Diabetes mellitus type 2 Last hemoglobin A1c 6.7 on 03/29/2018.  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 - Patient being followed by  Dr. [name], last seen on 06/10/2018, felt patient may not be a good candidate for any treatment with his current status and recommended working on nutrition. - Palliative medicine was consulted, seen by [name], goals of care addressed, DNR status  Dusky bluish coloration of the toes/feet -Concern for ischemic changes, poor perfusion due to significant GI bleed, Dopplers showed pulses on the right dorsalis pedis and tibialis, left tibialis -Patient had presented with GI bleed as such aspirin and Plavix currently on hold -ABIs done, patient seen by vascular surgery [name], recommended no role for vascular surgical intervention can follow-up on an as-needed basis.  Constipation Overnight having diarrhea, hold Movantik dose today, change MiraLAX to as needed   Leukocytosis Likely due to pneumonia on chest x-ray 1/27 - WBC count improving, MRSA PCR negative -Continue IV cefepime  Hyperlipidemia Continue statin  CAD with grade 2 diastolic dysfunction Currently no chest pain, continue to hold aspirin and Plavix -2+ pitting edema, beta-blocker on hold due to hypotension, aspirin and Plavix on hold due to presentation with acute GI bleed   Severe protein calorie malnutrition Likely due to progressive stage IV lung CA Patient received IV albumin during this hospitalization, albumin level 3.3  Stage I pressure injury to [facility] 6 Wound care consulted  Shortness of breath Likely due to symptomatic GI bleed, progressive lungs Ca pneumonia  Stage I pressure injury to coccyx Wound care consult  Code Status: *DNR DVT Prophylaxis: SCDs Family Communication: Discussed in detail with the patient, all imaging results, lab results explained to the patient and son yesterday,  This morning 2 family members in the room including wife   Disposition Plan: PT evaluation once stable to assess safety and disposition needs  Time Spent in minutes   35 minutes  Procedures:   Chest x-ray  06/21/2018, 06/22/2018, 06/24/2018  1 unit packed red blood cells 06/21/2018  2 units packed red blood cells 06/22/2018  Flexible sigmoidoscopy per [name] 06/22/2018  ABIs 06/23/2018  Consultants:    Gastroenterology: Dr. Silverio Decamp 06/21/2018  Palliative care: Dr.Anwar 06/22/2018 Vascular surgery: Dr. Donzetta Matters 06/23/2018* Cardiology, Dr. Radford Pax 1/31  Antimicrobials:   Anti-infectives (From admission, onward)   Start     Dose/Rate Route Frequency Ordered Stop   06/24/18 2359  vancomycin (VANCOCIN) IVPB 750 mg/150 ml premix  Status:  Discontinued     750 mg 150 mL/hr over 60 Minutes Intravenous Every 12 hours 06/24/18 1109 06/25/18 1002   06/24/18 2000  ceFEPIme (MAXIPIME) 1 g in sodium chloride 0.9 % 100 mL IVPB     1 g 200 mL/hr over 30 Minutes Intravenous Every 8 hours 06/24/18 1059     06/24/18 1200  vancomycin (VANCOCIN) 1,250 mg in sodium chloride 0.9 % 250 mL IVPB     1,250 mg 166.7 mL/hr over 90 Minutes Intravenous  Once 06/24/18 1102 06/24/18 1423   06/24/18 1130  ceFEPIme (MAXIPIME) 2 g in sodium chloride 0.9 % 100 mL IVPB     2 g 200 mL/hr over 30 Minutes Intravenous  Once 06/24/18 1059 06/24/18 1502  Medications  Scheduled Meds: . acetaminophen  650 mg Oral Once  . atorvastatin  40 mg Oral Daily  . chlorhexidine  15 mL Mouth Rinse BID  . Chlorhexidine Gluconate Cloth  6 each Topical Daily  . erythromycin  1 application Right Eye QHS  . feeding supplement (ENSURE ENLIVE)  237 mL Oral BID BM  . gatifloxacin  1 drop Right Eye QID  . HYDROcodone-acetaminophen  1 tablet Oral Q1400  . hydrocortisone  25 mg Rectal BID  . mouth rinse  15 mL Mouth Rinse q12n4p  . naloxegol oxalate  25 mg Oral Daily  . pantoprazole  40 mg Oral Daily  . polyvinyl alcohol  1 drop Both Eyes QID  . predniSONE  5 mg Oral BID WC  . sodium chloride flush  10-40 mL Intracatheter Q12H   Continuous Infusions: . sodium chloride 1,000 mL (06/28/18 1148)  . amiodarone 30 mg/hr (06/28/18  1150)  . ceFEPime (MAXIPIME) IV 1 g (06/28/18 1126)   PRN Meds:.sodium chloride, acetaminophen **OR** acetaminophen, chlorpheniramine-HYDROcodone, HYDROcodone-acetaminophen, ondansetron **OR** ondansetron (ZOFRAN) IV, polyethylene glycol, sodium chloride flush, traZODone      Subjective:   Laddie Math was seen and examined today.  Feels better today, heart rate controlled on amiodarone drip.  Denies nausea, vomiting, abdominal pain, any new weakness.  Lower extremity edema improving.   Objective:   Vitals:   06/28/18 0400 06/28/18 0800 06/28/18 1030 06/28/18 1200  BP: 101/69     Pulse: (!) 107  96   Resp: (!) 30  (!) 25   Temp:  97.7 F (36.5 C)  97.6 F (36.4 C)  TempSrc:  Axillary  Oral  SpO2: 99%  99%   Weight:      Height:        Intake/Output Summary (Last 24 hours) at 06/28/2018 1223 Last data filed at 06/28/2018 1000 Gross per 24 hour  Intake 1344.62 ml  Output 150 ml  Net 1194.62 ml     Wt Readings from Last 3 Encounters:  06/21/18 64.7 kg  06/10/18 64 kg  05/28/18 64.9 kg   Physical Exam  General: Alert and oriented x 3, NAD  Eyes:  HEENT:    Cardiovascular: S1 S2 clear, no murmurs, RRR. 1-2+ pedal edema b/l  Respiratory: Decreased breath sound at the bases  Gastrointestinal: Soft, nontender, nondistended, NBS  Ext: 1-2+ pedal edema bilaterally  Neuro: no new deficits  Musculoskeletal: No cyanosis, clubbing  Skin: No rashes  Psych: Normal affect and demeanor, alert and oriented x3   Data Reviewed:  I have personally reviewed following labs and imaging studies  Micro Results Recent Results (from the past 240 hour(s))  Blood culture (routine x 2)     Status: None   Collection Time: 06/21/18  1:05 PM  Result Value Ref Range Status   Specimen Description   Final    BLOOD PORTA CATH Performed at [facility], [place] [address], [place], [place] [address]    Special Requests   Final    BOTTLES DRAWN AEROBIC AND  ANAEROBIC Blood Culture adequate volume Performed at [place] [address], [place], [place] [address]    Culture   Final    NO GROWTH 5 DAYS Performed at [place] [facility], [place] [address], [place], [place] [address]    Report Status 06/26/2018 FINAL  Final  Blood culture (routine x 2)     Status: None   Collection Time: 06/21/18  1:05 PM  Result Value Ref  Range Status   Specimen Description   Final    BLOOD PORT Performed at [place] [address], [place], [place] [address]    Special Requests   Final    BOTTLES DRAWN AEROBIC AND ANAEROBIC Blood Culture adequate volume Performed at [place] [address], [place], [place] [address]    Culture   Final    NO GROWTH 5 DAYS Performed at [facility], [place] [address] [place], [place] [address]    Report Status 06/26/2018 FINAL  Final  MRSA PCR Screening     Status: None   Collection Time: 06/21/18  9:14 PM  Result Value Ref Range Status   MRSA by PCR NEGATIVE NEGATIVE Final    Comment:        The GeneXpert MRSA Assay (FDA approved for NASAL specimens only), is one component of a comprehensive MRSA colonization surveillance program. It is not intended to diagnose MRSA infection nor to guide or monitor treatment for MRSA infections. Performed at [facility], [place] [address], [place], [place] [address]   Culture, blood (Routine X 2) w Reflex to ID Panel     Status: None (Preliminary result)   Collection Time: 06/24/18 10:33 AM  Result Value Ref Range Status   Specimen Description   Final    LEFT ANTECUBITAL Performed at [place] [address] [place], [place] [address]    Special Requests   Final    BOTTLES DRAWN AEROBIC AND ANAEROBIC Blood Culture adequate volume Performed at [place] [address], [place], [place] [address]    Culture   Final    NO GROWTH 4  DAYS Performed at [facility], [place] [address] [place], [place] [address]    Report Status PENDING  Incomplete  Culture, blood (Routine X 2) w Reflex to ID Panel     Status: None (Preliminary result)   Collection Time: 06/24/18 11:07 AM  Result Value Ref Range Status   Specimen Description   Final    BLOOD RIGHT HAND Performed at [place] [address], [place], [place] [address]    Special Requests   Final    BOTTLES DRAWN AEROBIC ONLY Blood Culture adequate volume Performed at [place] [address], [place], [place] [address]    Culture   Final    NO GROWTH 4 DAYS Performed at [facility], [place] [address] [place], [place] [address]    Report Status PENDING  Incomplete  Culture, Urine     Status: None   Collection Time: 06/24/18  1:05 PM  Result Value Ref Range Status   Specimen Description   Final    URINE, RANDOM Performed at [place] [address], [place], [place] [address]    Special Requests   Final    NONE Performed at [facility], [place] [address] [place], [place] [address]    Culture   Final    NO GROWTH Performed at [facility], [place] [address], [place],  [address]    Report Status 06/25/2018 FINAL  Final  Culture, sputum-assessment     Status: None   Collection Time: 06/25/18  9:41 PM  Result Value Ref Range Status   Specimen Description SPUTUM  Final   Special Requests NONE  Final   Sputum evaluation   Final    THIS SPECIMEN IS ACCEPTABLE FOR  SPUTUM CULTURE Performed at [facility], [place] [address], [place], [place] [address]    Report Status 06/25/2018 FINAL  Final  Culture, respiratory     Status: None   Collection Time: 06/25/18  9:41 PM  Result Value Ref Range Status   Specimen Description   Final    SPUTUM Performed at [place] [address] [place], [place] [address]    Special  Requests   Final    NONE Reflexed from [id number] Performed at [facility], [place] [address] [place], [place] [address]    Gram Stain   Final    ABUNDANT WBC PRESENT,BOTH PMN AND MONONUCLEAR FEW GRAM VARIABLE ROD FEW GRAM NEGATIVE RODS FEW GRAM POSITIVE COCCI ABUNDANT SQUAMOUS EPITHELIAL CELLS PRESENT Performed at [facility], [place] [address] [place],  [address]    Culture FEW STAPHYLOCOCCUS AUREUS  Final   Report Status 06/28/2018 FINAL  Final   Organism ID, Bacteria STAPHYLOCOCCUS AUREUS  Final      Susceptibility   Staphylococcus aureus - MIC*    CIPROFLOXACIN <=0.5 SENSITIVE Sensitive     ERYTHROMYCIN <=0.25 SENSITIVE Sensitive     GENTAMICIN <=0.5 SENSITIVE Sensitive     OXACILLIN 0.5 SENSITIVE Sensitive     TETRACYCLINE <=1 SENSITIVE Sensitive     VANCOMYCIN <=0.5 SENSITIVE Sensitive     TRIMETH/SULFA <=10 SENSITIVE Sensitive     CLINDAMYCIN <=0.25 SENSITIVE Sensitive     RIFAMPIN <=0.5 SENSITIVE Sensitive     Inducible Clindamycin NEGATIVE Sensitive     * FEW STAPHYLOCOCCUS AUREUS    Radiology Reports Dg Chest 2 View  Result Date: 06/21/2018 CLINICAL DATA:  History of lung cancer.  Weight loss. EXAM: CHEST - 2 VIEW COMPARISON:  March 12, 2017 FINDINGS: The heart size is normal. Right central venous line is identified in the superior vena cava. There is right perihilar and hilar mass progressed compared to prior chest x-ray of 2018 consistent with patient's known lung cancer. The left lung is clear. Nipple shadow is identified in the left lung base. The visualized skeletal structures are stable. IMPRESSION: Right perihilar and hilar mass progressed compared to prior chest x-ray of 2018 consistent with patient's known lung cancer. No focal pneumonia is noted. Electronically Signed   By: Abelardo Diesel M.D.   On: 06/21/2018 10:27   Dg Chest Port 1 View  Result Date: 06/24/2018 CLINICAL DATA:  Leukocytosis. EXAM: PORTABLE CHEST 1 VIEW COMPARISON:   06/22/2018 FINDINGS: Right chest wall port a catheter is identified with tip in the right atrium. Extensive right lung post treatment changes with volume loss and asymmetric elevation of the right hemidiaphragm again noted. Similar appearance of perihilar masslike architectural distortion within the right lung. Asymmetric airspace opacity within the right lung base is new from the previous exam and may represent early pneumonia. The left lung appears clear. IMPRESSION: 1. New opacity within the right lung base compared with 06/22/2018 may represent a small early pneumonia. Electronically Signed   By: Kerby Moors M.D.   On: 06/24/2018 12:03   Dg Chest Port 1 View  Result Date: 06/22/2018 CLINICAL DATA:  Shortness of breath this morning. History of diabetes, lung cancer, hypertension and coronary artery disease. EXAM: PORTABLE CHEST 1 VIEW COMPARISON:  Chest x-ray dated 06/21/2018. FINDINGS: Heart size and mediastinal contours are stable. Stable RIGHT perihilar opacity, corresponding to patient's known history of lung cancer, better demonstrated on earlier chest CT of 10/19/2017. Stable volume loss on the RIGHT.  LEFT lung remains clear. No pleural effusion or pneumothorax seen. RIGHT-sided Port-A-Cath is stable in position with tip overlying the lower SVC/cavoatrial junction. IMPRESSION: No change compared to yesterday's chest x-ray. Known RIGHT perihilar cancer. No evidence of pneumonia or pulmonary edema. Electronically Signed   By: Franki Cabot M.D.   On: 06/22/2018 10:32   Vas [place] Burnard Bunting With/wo Tbi  Result Date: 06/25/2018 LOWER EXTREMITY DOPPLER STUDY Indications: Discolored toes.  Performing Technologist: Carlos Levering RVT  Examination Guidelines: A complete evaluation includes at minimum, Doppler waveform signals and systolic blood pressure reading at the level of bilateral brachial, anterior tibial, and posterior tibial arteries, when vessel segments are accessible. Bilateral testing is considered an  integral part of a complete examination. Photoelectric Plethysmograph (PPG) waveforms and toe systolic pressure readings are included as required and additional duplex testing as needed. Limited examinations for reoccurring indications may be performed as noted.  ABI Findings: +--------+------------------+-----+---------+--------+ Right   Rt Pressure (mmHg)IndexWaveform Comment  +--------+------------------+-----+---------+--------+ [id number]                    triphasic         +--------+------------------+-----+---------+--------+ PTA     139               1.05 biphasic          +--------+------------------+-----+---------+--------+ DP      113               0.85 biphasic          +--------+------------------+-----+---------+--------+ +--------+------------------+-----+---------+-------+ Left    Lt Pressure (mmHg)IndexWaveform Comment +--------+------------------+-----+---------+-------+ [id number]                    triphasic        +--------+------------------+-----+---------+-------+ PTA     145               1.09 biphasic         +--------+------------------+-----+---------+-------+ DP      134               1.01 biphasic         +--------+------------------+-----+---------+-------+ +-------+-----------+-----------+------------+------------+ ABI/TBIToday's ABIToday's TBIPrevious ABIPrevious TBI +-------+-----------+-----------+------------+------------+ Right  1.05                                           +-------+-----------+-----------+------------+------------+ Left   1.09                                           +-------+-----------+-----------+------------+------------+  Summary: Right: Resting right ankle-brachial index is within normal range. No evidence of significant right lower extremity arterial disease. Left: Resting left ankle-brachial index is within normal range. No evidence of significant left lower extremity arterial  disease.  *See table(s) above for measurements and observations.  Electronically signed by Ruta Hinds MD on 06/25/2018 at 11:52:37 AM.   Final     Lab Data:  CBC: Recent Labs  Lab 06/23/18 0314  06/24/18 0640  06/24/18 1538 06/25/18 0400 06/26/18 0348 06/27/18 1237 06/28/18 0734  WBC 53.7*  --  53.9*  --  54.1* 51.5* 44.1* 52.2* 47.5*  NEUTROABS 47.0*  --  47.8*  --  47.6* 45.4* 39.4*  --   --   HGB 9.4*   < > 8.9*   < >  8.6* 8.3* 7.5* 9.8* 8.6*  HCT 28.9*   < > 27.7*   < > 27.0* 26.3* 23.9* 31.0* 27.0*  MCV 87.0  --  90.5  --  90.0 90.7 93.4 94.2 93.8  PLT 152  --  120*  --  119* 117* 101* 115* 108*   < > = values in this interval not displayed.   Basic Metabolic Panel: Recent Labs  Lab 06/23/18 0314 06/24/18 0640 06/25/18 0400 06/26/18 0348 06/27/18 1237 06/28/18 0734  NA 129* 130* 129* 128* 130* 130*  K 4.4 3.8 4.5 4.2 3.9 4.2  CL 98 98 98 98 98 99  CO2 21* 22 22 24 25 24   GLUCOSE 171* 196* 133* 116* 144* 132*  BUN 63* 51* 37* 23 25* 29*  CREATININE 0.97 0.74 0.61 0.50* 0.55* 0.54*  CALCIUM 9.4 10.2 9.5 10.4* 10.1 9.3  MG 1.9 2.0  --   --   --  1.9  PHOS 3.9 2.5  --   --   --   --    GFR: Estimated Creatinine Clearance: 76.4 mL/min (A) (by C-G formula based on SCr of 0.54 mg/dL (L)). Liver Function Tests: Recent Labs  Lab 06/22/18 0509 06/24/18 0640  AST 35 30  ALT 28 33  ALKPHOS 82 91  BILITOT 0.7 0.8  PROT 5.8* 6.5  ALBUMIN 2.6* 3.3*   No results for input(s): LIPASE, AMYLASE in the last 168 hours. No results for input(s): AMMONIA in the last 168 hours. Coagulation Profile: Recent Labs  Lab 06/21/18 1400  INR 1.14   Cardiac Enzymes: No results for input(s): CKTOTAL, CKMB, CKMBINDEX, TROPONINI in the last 168 hours. BNP (last 3 results) No results for input(s): PROBNP in the last 8760 hours. HbA1C: No results for input(s): HGBA1C in the last 72 hours. CBG: Recent Labs  Lab 06/27/18 0743 06/27/18 1129 06/27/18 2111 06/28/18 0734  06/28/18 1144  GLUCAP 99 122* 157* 118* 141*   Lipid Profile: No results for input(s): CHOL, HDL, LDLCALC, TRIG, CHOLHDL, LDLDIRECT in the last 72 hours. Thyroid Function Tests: No results for input(s): TSH, T4TOTAL, FREET4, T3FREE, THYROIDAB in the last 72 hours. Anemia Panel: No results for input(s): VITAMINB12, FOLATE, FERRITIN, TIBC, IRON, RETICCTPCT in the last 72 hours. Urine analysis:    Component Value Date/Time   COLORURINE YELLOW 06/24/2018 1305   APPEARANCEUR CLEAR 06/24/2018 1305   LABSPEC 1.023 06/24/2018 1305   PHURINE 5.0 06/24/2018 1305   GLUCOSEU NEGATIVE 06/24/2018 1305   HGBUR SMALL (A) 06/24/2018 1305   BILIRUBINUR NEGATIVE 06/24/2018 1305   KETONESUR NEGATIVE 06/24/2018 1305   PROTEINUR 30 (A) 06/24/2018 1305   NITRITE NEGATIVE 06/24/2018 1305   LEUKOCYTESUR NEGATIVE 06/24/2018 1305     Malita Ignasiak M.D. [facility] 06/28/2018, 12:23 PM  Pager: [phone] Between 7am to 7pm - call Pager - [phone]  After 7pm go to [email or web address] - password [email or web address]  Call night coverage person covering after 7pm

## 2018-06-28 NOTE — Consult Note (Signed)
Admit date: 06/21/2018 Referring Physician  Dr. Tana Coast Primary Physician  Dr. Rob Bunting Primary Cardiologist  Dr. [name] Reason for Consultation  Atrial fibrillation with RVr  HPI: Randy [name] is a 73 y.o. male who is being seen today for the evaluation of Atrial fibrillation with RVr at the request of Dr. Tana Coast.  This is a 73yo male with a history of carotid artery dz s/p RCEA, ASCAD with MI in [place] with PCI and NSTEMI 5/17 with DES to LCx and POBA to OMA1, DM, low normal LVF with EF 50-55% and G2DD, hyperlipidemia, stage IV non-small cell lung CA and HTN who presented to [facility] with complaints of constipation, weakness, SOB and rectal bleeding.  Hbg dropped to 7.9.  He was transfused 1U PRBC but had ongoing rectal bleeding and Hbg dropped to 7.2 requiring more transfusions.  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  Flex sig showed diverticulosis as source of bleed.  He subsequently developed hypotension with worsening leukocytosis with WBC 54 and Cxray c/w PNA.  Started on IV Antibiotics per TRH.    Yesterday developed atrial flutter with RVR and 2:1 block and given Cardizem 74m IV with some improvement but then HR went up to 160-170's and SBP dropped to 969mg.  He was given Dig 0.2567mV once.  Given IVF bolus due to low BP and then started on Amio bolus at 150m68m and then gtt.  Overnight he converted to NSR.  Cardiology is now asked to consult.  He denies any CP, SOB, PND, orthopnea, dizziness or syncope. No history of Afib in the past.    PMH:   Past Medical History:  Diagnosis Date  . 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 [place]  . Carotid arterial disease (HCC)[place] a. S/p R CEA;  b. 05/2015 Carotid U/S: RICA 1-398-41%CA 40-566-06% Coronary artery disease    [place]  //  b. 04/2013 MV: inflat scar, no ischemia.  //  c.   NSTEMI 5/17 >> Promus DES to LCx and POBA to OM1 (ISR).   . Diabetes mellitus without complication (HCC)[place]  . Grade II diastolic dysfunction 10/130/16/0109History of cardiac catheterization 10/21/15   a. NSTEMI 5/17: oLAD 40, pLCx 90 (Promus DES), OM2 90 ISR (POBA), oRCA 25   . History of echocardiogram    a. Mild LVH, EF 50-55%, possible inferolateral HK, grade 2 diastolic dysfunction, trivial AI, MAC  . History of nuclear stress test 04/2013   a. Myoview 12/14: Intermediate risk, prior inferolateral MI, no ischemia, EF 45%  . Hx of leukocytosis   . Hyperlipidemia   . Hypertension   . Hypertensive heart disease   . Morbid obesity (HCC)[place]. Tobacco abuse      PSH:   Past Surgical History:  Procedure Laterality Date  . CARDIAC CATHETERIZATION    . CARDIAC CATHETERIZATION N/A 10/21/2015   Procedure: Left Heart Cath and Coronary Angiography;  Surgeon: JayaJettie Booze;  [facility];  Service: Cardiovascular;  Laterality: N/A;  . CARDIAC CATHETERIZATION N/A 10/21/2015   Procedure: Coronary Stent Intervention;  Surgeon: JayaJettie Booze;  [facility];  Service: Cardiovascular;  Laterality: N/A;  . CARDIAC CATHETERIZATION N/A 10/21/2015   Procedure: Coronary Balloon Angioplasty;  Surgeon: JayaJettie Booze;  [facility];  Service: Cardiovascular;  Laterality: N/A;  . CAROTID ENDARTERECTOMY    . CORONARY ANGIOPLASTY    . CORONARY STENT PLACEMENT    . FLEXIBLE SIGMOIDOSCOPY N/A 06/22/2018   Procedure: FLEXIBLE SIGMOIDOSCOPY;  Surgeon: Carol Ada, MD;  Location: [facility];  Service: Endoscopy;  Laterality: N/A;  . VIDEO BRONCHOSCOPY Bilateral 04/03/2017   Procedure: VIDEO BRONCHOSCOPY WITH FLUORO;  Surgeon: [name], MD;  Location: [facility];  Service: Cardiopulmonary;  Laterality: Bilateral;    Allergies:  Brilinta [ticagrelor] Prior to Admit Meds:   Medications Prior to Admission  Medication Sig Dispense Refill Last Dose  . acetaminophen (TYLENOL) 500 MG tablet Take 1,000 mg by mouth every 6 (six) hours as  needed for moderate pain.   Past Week at Unknown time  . aspirin EC 81 MG tablet Take 81 mg by mouth daily.   06/20/2018 at Unknown time  . atorvastatin (LIPITOR) 40 MG tablet TAKE 1 TABLET BY MOUTH EVERY DAY (Patient taking differently: Take 40 mg by mouth daily. ) 90 tablet 3 06/20/2018 at Unknown time  . Carboxymethylcellulose Sodium (THERATEARS) 0.25 % SOLN Place 1 drop into both eyes 4 (four) times daily.    06/20/2018 at Unknown time  . chlorpheniramine-HYDROcodone (TUSSIONEX PENNKINETIC ER) 10-8 MG/5ML SUER Take 5 mLs by mouth at bedtime as needed for cough.   06/20/2018 at Unknown time  . erythromycin ophthalmic ointment Place 1 application into the right eye at bedtime.   06/20/2018 at Unknown time  . HYDROcodone-acetaminophen (NORCO/VICODIN) 5-325 MG tablet Take 1 tablet by mouth every 6 (six) hours as needed for moderate pain.    Past Week at Unknown time  . moxifloxacin (VIGAMOX) 0.5 % ophthalmic solution Place 1 drop into the right eye 4 (four) times daily.   06/20/2018 at Unknown time  . naloxegol oxalate (MOVANTIK) 25 MG TABS tablet Take 1 tablet (25 mg total) by mouth daily after breakfast. Take 1h before or 2h after meal 30 tablet 3 06/20/2018 at Unknown time  . predniSONE (DELTASONE) 10 MG tablet Take 0.5 tablets (5 mg total) by mouth 2 (two) times daily with a meal. 60 tablet 0 06/20/2018 at Unknown time  . Probiotic Product (PROBIOTIC PO) Take 1 capsule by mouth daily.   06/20/2018 at Unknown time  . traZODone (DESYREL) 50 MG tablet Take 50 mg by mouth at bedtime as needed for sleep.    Past Week at Unknown time  . AMBULATORY NON FORMULARY MEDICATION Power Wheelchair and accessories Diagnoses Stage IV squamous cell carcinoma of right lung - C34.91, Debility R53.81 1 each 0   . mirtazapine (REMERON) 30 MG tablet Take 1 tablet (30 mg total) by mouth at bedtime. (Patient not taking: Reported on 06/21/2018) 30 tablet 2 Not Taking at Unknown time   Fam HX:    Family History  Problem Relation  Age of Onset  . Other Mother        died @ 56 - complications following surgery.  . Hypertension Father        died in [place] @ 68   Social HX:    Social History   Socioeconomic History  . Marital status: Married    Spouse name: ardith  . Number of children: 7  . Years of education: college  . Highest education level: Not on file  Occupational History  . Occupation: retired  Scientific laboratory technician  . Financial resource strain: Not on file  . Food insecurity:    Worry: Not on file    Inability: Not on file  .  Transportation needs:    Medical: Not on file    Non-medical: Not on file  Tobacco Use  . Smoking status: Former Smoker    Packs/day: 0.75    Years: 40.00    Pack years: 30.00    Types: Cigarettes    Start date: 06/18/1972    Last attempt to quit: 10/24/2015    Years since quitting: 2.6  . Smokeless tobacco: Never Used  Substance and Sexual Activity  . Alcohol use: No    Alcohol/week: 0.0 standard drinks  . Drug use: No  . Sexual activity: Not Currently  Lifestyle  . Physical activity:    Days per week: Not on file    Minutes per session: Not on file  . Stress: Not on file  Relationships  . Social connections:    Talks on phone: Not on file    Gets together: Not on file    Attends religious service: Not on file    Active member of club or organization: Not on file    Attends meetings of clubs or organizations: Not on file    Relationship status: Not on file  . Intimate partner violence:    Fear of current or ex partner: Not on file    Emotionally abused: Not on file    Physically abused: Not on file    Forced sexual activity: Not on file  Other Topics Concern  . Not on file  Social History Narrative   Lives in [place] with wife.  Does not routinely exercise.  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     ROS:  All  ROS were addressed and are negative except what is stated in the HPI  Physical Exam: Blood pressure 101/69, pulse  (!) 107, temperature 97.7 F (36.5 C), temperature source Axillary, resp. rate (!) 30, height _0  (1.803 m), weight 64.7 kg, SpO2 99 %.    General: Well developed, well nourished, in no acute distress Head: Eyes PERRLA, No xanthomas.   Normal cephalic and atramatic  Lungs:   Clear bilaterally to auscultation and percussion. Heart:   HRRR S1 S2 Pulses are 2+ & equal.            No carotid bruit. No JVD.  No abdominal bruits. No femoral bruits. Abdomen: Bowel sounds are positive, abdomen soft and non-tender without masses or                  Hernia's noted. Msk:  Back normal, normal gait. Normal strength and tone for age. Extremities:   No clubbing, cyanosis or edema.  DP +1 Neuro: Alert and oriented X 3. Psych:  Good affect, responds appropriately  Labs:   Lab Results  Component Value Date   WBC 52.2 (HH) 06/27/2018   HGB 9.8 (L) 06/27/2018   HCT 31.0 (L) 06/27/2018   MCV 94.2 06/27/2018   PLT 115 (L) 06/27/2018    Recent Labs  Lab 06/24/18 0640  06/27/18 1237  NA 130*   < > 130*  K 3.8   < > 3.9  CL 98   < > 98  CO2 22   < > 25  BUN 51*   < > 25*  CREATININE 0.74   < > 0.55*  CALCIUM 10.2   < > 10.1  PROT 6.5  --   --   BILITOT 0.8  --   --   ALKPHOS 91  --   --   ALT 33  --   --  AST 30  --   --   GLUCOSE 196*   < > 144*   < > = values in this interval not displayed.   No results found for: PTT Lab Results  Component Value Date   INR 1.14 06/21/2018   INR 1.14 06/21/2018   INR 1.04 11/08/2017   Lab Results  Component Value Date   TROPONINI 0.32 (H) 10/21/2015     Lab Results  Component Value Date   CHOL 116 10/20/2016   CHOL 125 12/24/2015   CHOL 119 (L) 09/03/2015   Lab Results  Component Value Date   HDL 31 (L) 10/20/2016   HDL 39 (L) 12/24/2015   HDL 35 (L) 09/03/2015   Lab Results  Component Value Date   LDLCALC 55 10/20/2016   LDLCALC 66 12/24/2015   LDLCALC 65 09/03/2015   Lab Results  Component Value Date   TRIG 149 10/20/2016    TRIG 99 12/24/2015   TRIG 97 09/03/2015   Lab Results  Component Value Date   CHOLHDL 3.7 10/20/2016   CHOLHDL 3.2 12/24/2015   CHOLHDL 3.4 09/03/2015   No results found for: LDLDIRECT    Radiology:  No results found.   Telemetry    NSR - Personally Reviewed  ECG    EKG 1/30 showed atrial flutter with 2:1 block at 168bpm - Personally Reviewed   ASSESSMENT/PLAN:   1.  New onset atrial flutter with RVR and 2:1 block -now in NSR after IV Amio -continue Amio gtt for now until more stable from GI issues and PNA -did not tolerate IV Cardizem due to hypotensions -check 2D echo to assess LVF and LA size -Keep K+>4. -not a candidate for anticoagulation in the setting of GI bleed (CHADS2VASC score is 4)  2.  Healthcare associated pneumonia -on antibx -per TRH  3.  Hypotension -multifactorial due to anemia, sepsis, PNA -BP stable at present  4.  Acute GI bleed -secondary to diverticulosis -received several units of PRBC -Hbg 9.8 today -per GI  5.  Stage IV non small cell lung CAD -per TRH/oncology  6.  ASCAD -NSTEMI 5/17 with DES to LCx and POBA to OMA1 -denies any anginal sx -continue statin -no ASA due to GI bleed     Fransico Him, MD  06/28/2018  8:44 AM

## 2018-06-29 ENCOUNTER — Inpatient Hospital Stay (HOSPITAL_COMMUNITY): Payer: Medicare Other

## 2018-06-29 DIAGNOSIS — K5791 Diverticulosis of intestine, part unspecified, without perforation or abscess with bleeding: Secondary | ICD-10-CM

## 2018-06-29 DIAGNOSIS — I483 Typical atrial flutter: Secondary | ICD-10-CM

## 2018-06-29 DIAGNOSIS — I4892 Unspecified atrial flutter: Secondary | ICD-10-CM

## 2018-06-29 LAB — CULTURE, BLOOD (ROUTINE X 2)
Culture: NO GROWTH
Culture: NO GROWTH
SPECIAL REQUESTS: ADEQUATE
SPECIAL REQUESTS: ADEQUATE

## 2018-06-29 LAB — GLUCOSE, CAPILLARY
GLUCOSE-CAPILLARY: 142 mg/dL — AB (ref 70–99)
Glucose-Capillary: 112 mg/dL — ABNORMAL HIGH (ref 70–99)
Glucose-Capillary: 117 mg/dL — ABNORMAL HIGH (ref 70–99)
Glucose-Capillary: 163 mg/dL — ABNORMAL HIGH (ref 70–99)

## 2018-06-29 LAB — CBC
HCT: 26.6 % — ABNORMAL LOW (ref 39.0–52.0)
Hemoglobin: 8.4 g/dL — ABNORMAL LOW (ref 13.0–17.0)
MCH: 29.3 pg (ref 26.0–34.0)
MCHC: 31.6 g/dL (ref 30.0–36.0)
MCV: 92.7 fL (ref 80.0–100.0)
NRBC: 0 % (ref 0.0–0.2)
PLATELETS: 134 10*3/uL — AB (ref 150–400)
RBC: 2.87 MIL/uL — AB (ref 4.22–5.81)
RDW: 18.8 % — ABNORMAL HIGH (ref 11.5–15.5)
WBC: 44.4 10*3/uL — ABNORMAL HIGH (ref 4.0–10.5)

## 2018-06-29 LAB — BASIC METABOLIC PANEL
Anion gap: 8 (ref 5–15)
BUN: 30 mg/dL — ABNORMAL HIGH (ref 8–23)
CO2: 25 mmol/L (ref 22–32)
Calcium: 9.8 mg/dL (ref 8.9–10.3)
Chloride: 98 mmol/L (ref 98–111)
Creatinine, Ser: 0.54 mg/dL — ABNORMAL LOW (ref 0.61–1.24)
GFR calc Af Amer: >60 mL/min (ref >60)
GFR calc non Af Amer: >60 mL/min (ref >60)
Glucose, Bld: 131 mg/dL — ABNORMAL HIGH (ref 70–99)
Potassium: 4.2 mmol/L (ref 3.5–5.1)
Sodium: 131 mmol/L — ABNORMAL LOW (ref 135–145)

## 2018-06-29 LAB — ECHOCARDIOGRAM COMPLETE
HEIGHTINCHES: 71 in
Weight: 2282.2 oz

## 2018-06-29 MED ORDER — AMIODARONE HCL 200 MG PO TABS: 200.0000 mg | ORAL_TABLET | Freq: Every day | ORAL | Status: DC

## 2018-06-29 MED ORDER — AMIODARONE HCL 200 MG PO TABS
400.0000 mg | ORAL_TABLET | Freq: Every day | ORAL | Status: DC
  Administered 2018-06-29 – 2018-07-02 (×4): 400 mg via ORAL
  Filled 2018-06-29 (×4): qty 2

## 2018-06-29 MED ORDER — PERFLUTREN LIPID MICROSPHERE
1.0000 mL | INTRAVENOUS | Status: AC | PRN
  Administered 2018-06-29: 3 mL via INTRAVENOUS
  Filled 2018-06-29: qty 10

## 2018-06-29 NOTE — Progress Notes (Signed)
  Echocardiogram 2D Echocardiogram has been performed.  Randy [name] 06/29/2018, 9:53 AM

## 2018-06-29 NOTE — Progress Notes (Signed)
[facility]                                                                              Patient Demographics  Randy [name], is a 73 y.o. male, DOB - 1946-01-03, MGQ:[id number]  Admit date - 06/21/2018   Admitting Physician [name], MD  Outpatient Primary MD for the patient is [name], PA-C  Outpatient specialists:   LOS - 8  days   Medical records reviewed and are as summarized below:    Chief Complaint  Patient presents with  . 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  Patient on admission noted to have a hemoglobin of 7.9 from 11.9 on 06/10/2018.  Patient transfused a unit of packed red blood cells however had ongoing rectal bleeding the night of 06/21/2018 with 7 episodes of bloody bowel movements with hemoglobin dropping down to 7.2 despite a unit of packed red blood cell transfusion.  GI was consulted and felt that patient's anemia/bleed could likely be multifactorial secondary to internal hemorrhoid hemorrhage versus rectal stercoral ulcer.  Assessment & Plan    Principal Problem: GI bleed/hematochezia secondary to presumed diverticular bleed -History of constipation likely opioid induced on Movantik and MiraLAX as needed at home.  Prior to admission patient had significant constipation with concerns for impaction, hence there was concern for hemorrhoidal bleed versus stercoral ulcer -On the night of 06/21/2018, patient had ongoing GI bleed with 7 bloody BMs, on  admission hemoglobin 7.9, received total of 3 units packed RBCs during the hospitalization -Patient underwent flexible sigmoidoscopy on 06/22/2018 which showed diverticulosis -GI, [name] recommended continuation of Movantik, MiraLAX twice daily, rectal hydrocortisone suppositories twice daily. -Aspirin and Plavix on hold, received 1 packed RBC transfusion on 1/29 -H&H currently stable  Atrial flutter with RVR and 2:1 block, new -On 06/27/2018 evening, patient went into rapid atrial flutter, given Cardizem 10 mg IV x1, with mild improvement in heart rate briefly however became hypotensive, digoxin 0.25 mg IV x1 with no effect, then placed on amiodarone drip -No anticoagulation due to GI bleed, cardiology following -Patient has converted to sinus rhythm, transition to oral amiodarone 400 mg daily for 2 weeks then 200 mg daily   Symptomatic anemia/acute blood loss anemia -Received total of 4 units packed RBCs till now -H&H stable  Healthcare associated pneumonia -On 06/24/2018, patient was found to be hypotensive with systolic BPs in 24P, worsening leukocytosis, high as 54.1 -Chest x-ray was concerning for pneumonia, initially placed on IV vancomycin and IV cefepime. -Urine strep antigen negative  Hypotension -Likely due to symptomatic anemia however concern for sepsis due to worsening leukocytosis, pneumonia, hypotension.  BP currently stable  Hypothermia -Resolved likely due to sepsis, pneumonia -TSH 2.0  Diabetes mellitus type 2 Last hemoglobin A1c 6.7 on 03/29/2018.  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 - Patient being followed by Dr. [name], last seen on 06/10/2018, felt patient may not be a good candidate for any treatment with his current status and recommended working on nutrition. - Palliative medicine was consulted, seen by [name], goals of care addressed, DNR status  Dusky bluish coloration of the toes/feet -Concern for ischemic changes, poor perfusion due to significant GI bleed, Dopplers showed pulses on the right dorsalis pedis and tibialis, left tibialis -Patient had presented with GI bleed as such aspirin and Plavix currently on hold -ABIs done, patient seen by vascular surgery [name], recommended no role for vascular surgical intervention can follow-up on an as-needed basis.  Constipation Restart Movantik, continue MiraLAX   Leukocytosis Likely due to pneumonia on chest x-ray 1/27 - WBC count improving, MRSA PCR negative -Continue IV cefepime  Hyperlipidemia Continue statin  CAD with grade 2 diastolic dysfunction Currently no chest pain, continue to hold aspirin and Plavix -2+ pitting edema, beta-blocker on hold due to hypotension, aspirin and Plavix on hold due to presentation with acute GI bleed  Severe protein calorie malnutrition Likely due to progressive stage IV lung CA Patient received IV albumin during this hospitalization, albumin level 3.3  Stage I pressure injury to coccyx Wound care consulted   Code Status: *DNR DVT Prophylaxis: SCDs Family Communication: Discussed in detail with the patient, all imaging results, lab results explained to the patient and wife at bedside   Disposition Plan: PT evaluation once stable to assess safety and disposition needs  Time Spent in minutes   35 minutes  Procedures:   Chest x-ray 06/21/2018, 06/22/2018, 06/24/2018  1 unit packed red blood cells 06/21/2018  2 units packed red blood cells 06/22/2018  Flexible sigmoidoscopy per [name] 06/22/2018  ABIs 06/23/2018  Consultants:      Gastroenterology: Dr. Silverio Decamp 06/21/2018  Palliative care: Dr.Anwar 06/22/2018 Vascular surgery: Dr. Donzetta Matters 06/23/2018* Cardiology, Dr. Radford Pax 1/31  Antimicrobials:   Anti-infectives (From admission, onward)   Start     Dose/Rate Route Frequency Ordered Stop   06/24/18 2359  vancomycin (VANCOCIN) IVPB 750 mg/150 ml premix  Status:  Discontinued     750 mg 150 mL/hr over 60 Minutes Intravenous Every 12 hours 06/24/18 1109 06/25/18 1002   06/24/18 2000  ceFEPIme (MAXIPIME) 1 g in sodium chloride 0.9 % 100 mL IVPB     1 g 200 mL/hr over 30 Minutes Intravenous Every 8 hours 06/24/18 1059     06/24/18 1200  vancomycin (VANCOCIN) 1,250 mg in sodium chloride 0.9 % 250 mL IVPB     1,250 mg 166.7 mL/hr over 90 Minutes Intravenous  Once 06/24/18 1102 06/24/18 1423   06/24/18 1130  ceFEPIme (MAXIPIME) 2 g in sodium chloride 0.9 % 100 mL IVPB     2 g 200 mL/hr over 30 Minutes Intravenous  Once 06/24/18 1059 06/24/18 1502         Medications  Scheduled Meds: . acetaminophen  650 mg Oral Once  . [START ON 07/13/2018] amiodarone  200 mg Oral Daily  . amiodarone  400 mg Oral Daily  .  atorvastatin  40 mg Oral Daily  . chlorhexidine  15 mL Mouth Rinse BID  . Chlorhexidine Gluconate Cloth  6 each Topical Daily  . erythromycin  1 application Right Eye QHS  . feeding supplement (ENSURE ENLIVE)  237 mL Oral BID BM  . gatifloxacin  1 drop Right Eye QID  . HYDROcodone-acetaminophen  1 tablet Oral Q1400  . hydrocortisone  25 mg Rectal BID  . mouth rinse  15 mL Mouth Rinse q12n4p  . naloxegol oxalate  25 mg Oral Daily  . pantoprazole  40 mg Oral Daily  . polyvinyl alcohol  1 drop Both Eyes QID  . predniSONE  5 mg Oral BID WC  . sodium chloride flush  10-40 mL Intracatheter Q12H   Continuous Infusions: . sodium chloride 1,000 mL (06/28/18 1148)  . ceFEPime (MAXIPIME) IV 1 g (06/29/18 1139)   PRN Meds:.sodium chloride, acetaminophen **OR** acetaminophen, chlorpheniramine-HYDROcodone,  HYDROcodone-acetaminophen, ondansetron **OR** ondansetron (ZOFRAN) IV, perflutren lipid microspheres (DEFINITY) IV suspension, polyethylene glycol, sodium chloride flush, traZODone      Subjective:   Randy [name] was seen and examined today.  Normal sinus rhythm, no acute events overnight, no chest pain.  Overall feels a lot better today.  At the time of my examination still on amiodarone drip. Denies nausea, vomiting, abdominal pain, any new weakness.    Objective:   Vitals:   06/29/18 0900 06/29/18 1000 06/29/18 1042 06/29/18 1100  BP:   108/65 107/70  Pulse: 89 87 89 92  Resp: (!) 25 (!) 27 (!) 25 (!) 30  Temp:      TempSrc:      SpO2: 100% 100% 100% 100%  Weight:      Height:        Intake/Output Summary (Last 24 hours) at 06/29/2018 1151 Last data filed at 06/29/2018 1035 Gross per 24 hour  Intake 876.94 ml  Output 1450 ml  Net -573.06 ml     Wt Readings from Last 3 Encounters:  06/21/18 64.7 kg  06/10/18 64 kg  05/28/18 64.9 kg    Physical Exam  General: Alert and oriented x 3, NAD, ill-appearing, cachectic  Eyes:   HEENT:    Cardiovascular: S1 S2 clear, RR.  1-2+ pedal edema b/l  Respiratory: CTAB, no wheezing, rales or rhonchi  Gastrointestinal: Soft, nontender, nondistended, NBS  Ext: 2+ pedal edema bilaterally  Neuro: no new deficits  Musculoskeletal: No cyanosis, clubbing  Skin: No rashes  Psych: Normal affect and demeanor, alert and oriented x3     Data Reviewed:  I have personally reviewed following labs and imaging studies  Micro Results Recent Results (from the past 240 hour(s))  Blood culture (routine x 2)     Status: None   Collection Time: 06/21/18  1:05 PM  Result Value Ref Range Status   Specimen Description   Final    BLOOD PORTA CATH Performed at [facility], [address], [place], [place] [address]    Special Requests   Final    BOTTLES DRAWN AEROBIC AND ANAEROBIC Blood Culture adequate  volume Performed at [place] [address], [place], [place] [address]    Culture   Final    NO GROWTH 5 DAYS Performed at [facility], [place] [address], [place], [place] [address]    Report Status 06/26/2018 FINAL  Final  Blood culture (routine x 2)     Status: None   Collection Time: 06/21/18  1:05 PM  Result Value Ref  Range Status   Specimen Description   Final    BLOOD PORT Performed at [facility] [address], [place], [place] [address]    Special Requests   Final    BOTTLES DRAWN AEROBIC AND ANAEROBIC Blood Culture adequate volume Performed at [place] [address], [place], [place] [address]    Culture   Final    NO GROWTH 5 DAYS Performed at [facility], [place] [address] [place], [place] [address]    Report Status 06/26/2018 FINAL  Final  MRSA PCR Screening     Status: None   Collection Time: 06/21/18  9:14 PM  Result Value Ref Range Status   MRSA by PCR NEGATIVE NEGATIVE Final    Comment:        The GeneXpert MRSA Assay (FDA approved for NASAL specimens only), is one component of a comprehensive MRSA colonization surveillance program. It is not intended to diagnose MRSA infection nor to guide or monitor treatment for MRSA infections. Performed at [facility], [place] [address] [place], [place] [address]   Culture, blood (Routine X 2) w Reflex to ID Panel     Status: None   Collection Time: 06/24/18 10:33 AM  Result Value Ref Range Status   Specimen Description LEFT ANTECUBITAL  Final   Special Requests   Final    BOTTLES DRAWN AEROBIC AND ANAEROBIC Blood Culture adequate volume Performed at [place] [address], [place], [place] [address]    Culture NO GROWTH 5 DAYS  Final   Report Status 06/29/2018 FINAL  Final  Culture, blood (Routine X 2) w Reflex to ID Panel     Status: None   Collection Time: 06/24/18 11:07 AM  Result  Value Ref Range Status   Specimen Description BLOOD RIGHT HAND  Final   Special Requests   Final    BOTTLES DRAWN AEROBIC ONLY Blood Culture adequate volume Performed at [facility] [address] [place], [place] [address]    Culture NO GROWTH 5 DAYS  Final   Report Status 06/29/2018 FINAL  Final  Culture, Urine     Status: None   Collection Time: 06/24/18  1:05 PM  Result Value Ref Range Status   Specimen Description   Final    URINE, RANDOM Performed at [facility] [address], [place], [place] [address]    Special Requests   Final    NONE Performed at [facility], [place] [address] [place], [place] [address]    Culture   Final    NO GROWTH Performed at [facility], [place] [address], [place], [place] [address]    Report Status 06/25/2018 FINAL  Final  Culture, sputum-assessment     Status: None   Collection Time: 06/25/18  9:41 PM  Result Value Ref Range Status   Specimen Description SPUTUM  Final   Special Requests NONE  Final   Sputum evaluation   Final    THIS SPECIMEN IS ACCEPTABLE FOR SPUTUM CULTURE Performed at [facility], [place] [address], [place], [place] [address]    Report Status 06/25/2018 FINAL  Final  Culture, respiratory     Status: None   Collection Time: 06/25/18  9:41 PM  Result Value Ref Range Status   Specimen Description   Final    SPUTUM Performed at [place] [address], [place],  [address]  Special Requests   Final    NONE Reflexed from [phone] Performed at [facility], [place] [address] [place], [place] [address]    Gram Stain   Final    ABUNDANT WBC PRESENT,BOTH PMN AND MONONUCLEAR FEW GRAM VARIABLE ROD FEW GRAM NEGATIVE RODS FEW GRAM POSITIVE COCCI ABUNDANT SQUAMOUS EPITHELIAL CELLS PRESENT Performed at [facility], [place] [address] [place], [place] [address]    Culture FEW STAPHYLOCOCCUS  AUREUS  Final   Report Status 06/28/2018 FINAL  Final   Organism ID, Bacteria STAPHYLOCOCCUS AUREUS  Final      Susceptibility   Staphylococcus aureus - MIC*    CIPROFLOXACIN <=0.5 SENSITIVE Sensitive     ERYTHROMYCIN <=0.25 SENSITIVE Sensitive     GENTAMICIN <=0.5 SENSITIVE Sensitive     OXACILLIN 0.5 SENSITIVE Sensitive     TETRACYCLINE <=1 SENSITIVE Sensitive     VANCOMYCIN <=0.5 SENSITIVE Sensitive     TRIMETH/SULFA <=10 SENSITIVE Sensitive     CLINDAMYCIN <=0.25 SENSITIVE Sensitive     RIFAMPIN <=0.5 SENSITIVE Sensitive     Inducible Clindamycin NEGATIVE Sensitive     * FEW STAPHYLOCOCCUS AUREUS    Radiology Reports Dg Chest 2 View  Result Date: 06/21/2018 CLINICAL DATA:  History of lung cancer.  Weight loss. EXAM: CHEST - 2 VIEW COMPARISON:  March 12, 2017 FINDINGS: The heart size is normal. Right central venous line is identified in the superior vena cava. There is right perihilar and hilar mass progressed compared to prior chest x-ray of 2018 consistent with patient's known lung cancer. The left lung is clear. Nipple shadow is identified in the left lung base. The visualized skeletal structures are stable. IMPRESSION: Right perihilar and hilar mass progressed compared to prior chest x-ray of 2018 consistent with patient's known lung cancer. No focal pneumonia is noted. Electronically Signed   By: Abelardo Diesel M.D.   On: 06/21/2018 10:27   Dg Chest Port 1 View  Result Date: 06/24/2018 CLINICAL DATA:  Leukocytosis. EXAM: PORTABLE CHEST 1 VIEW COMPARISON:  06/22/2018 FINDINGS: Right chest wall port a catheter is identified with tip in the right atrium. Extensive right lung post treatment changes with volume loss and asymmetric elevation of the right hemidiaphragm again noted. Similar appearance of perihilar masslike architectural distortion within the right lung. Asymmetric airspace opacity within the right lung base is new from the previous exam and may represent early pneumonia.  The left lung appears clear. IMPRESSION: 1. New opacity within the right lung base compared with 06/22/2018 may represent a small early pneumonia. Electronically Signed   By: Kerby Moors M.D.   On: 06/24/2018 12:03   Dg Chest Port 1 View  Result Date: 06/22/2018 CLINICAL DATA:  Shortness of breath this morning. History of diabetes, lung cancer, hypertension and coronary artery disease. EXAM: PORTABLE CHEST 1 VIEW COMPARISON:  Chest x-ray dated 06/21/2018. FINDINGS: Heart size and mediastinal contours are stable. Stable RIGHT perihilar opacity, corresponding to patient's known history of lung cancer, better demonstrated on earlier chest CT of 10/19/2017. Stable volume loss on the RIGHT. LEFT lung remains clear. No pleural effusion or pneumothorax seen. RIGHT-sided Port-A-Cath is stable in position with tip overlying the lower SVC/cavoatrial junction. IMPRESSION: No change compared to yesterday's chest x-ray. Known RIGHT perihilar cancer. No evidence of pneumonia or pulmonary edema. Electronically Signed   By: Franki Cabot M.D.   On: 06/22/2018 10:32   Vas [place] Burnard Bunting With/wo Tbi  Result Date: 06/25/2018 LOWER EXTREMITY DOPPLER STUDY Indications: Discolored toes.  Performing Technologist: Carlos Levering RVT  Examination Guidelines: A complete evaluation includes at minimum, Doppler waveform signals and systolic blood pressure reading at the level of bilateral brachial, anterior tibial, and posterior tibial arteries, when vessel segments are accessible. Bilateral testing is considered an integral part of a complete examination. Photoelectric Plethysmograph (PPG) waveforms and toe systolic pressure readings are included as required and additional duplex testing as needed. Limited examinations for reoccurring indications may be performed as noted.  ABI Findings: +--------+------------------+-----+---------+--------+ Right   Rt Pressure (mmHg)IndexWaveform Comment   +--------+------------------+-----+---------+--------+ [id number]                    triphasic         +--------+------------------+-----+---------+--------+ PTA     139               1.05 biphasic          +--------+------------------+-----+---------+--------+ DP      113               0.85 biphasic          +--------+------------------+-----+---------+--------+ +--------+------------------+-----+---------+-------+ Left    Lt Pressure (mmHg)IndexWaveform Comment +--------+------------------+-----+---------+-------+ [id number]                    triphasic        +--------+------------------+-----+---------+-------+ PTA     145               1.09 biphasic         +--------+------------------+-----+---------+-------+ DP      134               1.01 biphasic         +--------+------------------+-----+---------+-------+ +-------+-----------+-----------+------------+------------+ ABI/TBIToday's ABIToday's TBIPrevious ABIPrevious TBI +-------+-----------+-----------+------------+------------+ Right  1.05                                           +-------+-----------+-----------+------------+------------+ Left   1.09                                           +-------+-----------+-----------+------------+------------+  Summary: Right: Resting right ankle-brachial index is within normal range. No evidence of significant right lower extremity arterial disease. Left: Resting left ankle-brachial index is within normal range. No evidence of significant left lower extremity arterial disease.  *See table(s) above for measurements and observations.  Electronically signed by Ruta Hinds MD on 06/25/2018 at 11:52:37 AM.   Final     Lab Data:  CBC: Recent Labs  Lab 06/23/18 0314  06/24/18 0640  06/24/18 1538 06/25/18 0400 06/26/18 0348 06/27/18 1237 06/28/18 0734 06/29/18 0342  WBC 53.7*  --  53.9*  --  54.1* 51.5* 44.1* 52.2* 47.5* 44.4*  NEUTROABS 47.0*   --  47.8*  --  47.6* 45.4* 39.4*  --   --   --   HGB 9.4*   < > 8.9*   < > 8.6* 8.3* 7.5* 9.8* 8.6* 8.4*  HCT 28.9*   < > 27.7*   < > 27.0* 26.3* 23.9* 31.0* 27.0* 26.6*  MCV 87.0  --  90.5  --  90.0 90.7 93.4 94.2 93.8 92.7  PLT 152  --  120*  --  119* 117* 101* 115* 108* 134*   < > = values in this interval not displayed.  Basic Metabolic Panel: Recent Labs  Lab 06/23/18 0314 06/24/18 0640 06/25/18 0400 06/26/18 0348 06/27/18 1237 06/28/18 0734 06/29/18 0342  NA 129* 130* 129* 128* 130* 130* 131*  K 4.4 3.8 4.5 4.2 3.9 4.2 4.2  CL 98 98 98 98 98 99 98  CO2 21* 22 22 24 25 24 25   GLUCOSE 171* 196* 133* 116* 144* 132* 131*  BUN 63* 51* 37* 23 25* 29* 30*  CREATININE 0.97 0.74 0.61 0.50* 0.55* 0.54* 0.54*  CALCIUM 9.4 10.2 9.5 10.4* 10.1 9.3 9.8  MG 1.9 2.0  --   --   --  1.9  --   PHOS 3.9 2.5  --   --   --   --   --    GFR: Estimated Creatinine Clearance: 76.4 mL/min (A) (by C-G formula based on SCr of 0.54 mg/dL (L)). Liver Function Tests: Recent Labs  Lab 06/24/18 0640  AST 30  ALT 33  ALKPHOS 91  BILITOT 0.8  PROT 6.5  ALBUMIN 3.3*   No results for input(s): LIPASE, AMYLASE in the last 168 hours. No results for input(s): AMMONIA in the last 168 hours. Coagulation Profile: No results for input(s): INR, PROTIME in the last 168 hours. Cardiac Enzymes: No results for input(s): CKTOTAL, CKMB, CKMBINDEX, TROPONINI in the last 168 hours. BNP (last 3 results) No results for input(s): PROBNP in the last 8760 hours. HbA1C: No results for input(s): HGBA1C in the last 72 hours. CBG: Recent Labs  Lab 06/28/18 0734 06/28/18 1144 06/28/18 1631 06/28/18 2146 06/29/18 0732  GLUCAP 118* 141* 128* 130* 112*   Lipid Profile: No results for input(s): CHOL, HDL, LDLCALC, TRIG, CHOLHDL, LDLDIRECT in the last 72 hours. Thyroid Function Tests: No results for input(s): TSH, T4TOTAL, FREET4, T3FREE, THYROIDAB in the last 72 hours. Anemia Panel: No results for input(s):  VITAMINB12, FOLATE, FERRITIN, TIBC, IRON, RETICCTPCT in the last 72 hours. Urine analysis:    Component Value Date/Time   COLORURINE YELLOW 06/24/2018 1305   APPEARANCEUR CLEAR 06/24/2018 1305   LABSPEC 1.023 06/24/2018 1305   PHURINE 5.0 06/24/2018 1305   GLUCOSEU NEGATIVE 06/24/2018 1305   HGBUR SMALL (A) 06/24/2018 1305   BILIRUBINUR NEGATIVE 06/24/2018 1305   KETONESUR NEGATIVE 06/24/2018 1305   PROTEINUR 30 (A) 06/24/2018 1305   NITRITE NEGATIVE 06/24/2018 1305   LEUKOCYTESUR NEGATIVE 06/24/2018 1305     Julis Haubner M.D. [facility] 06/29/2018, 11:51 AM  Pager: [phone] Between 7am to 7pm - call Pager - [phone]  After 7pm go to [email or web address] - password [email or web address]  Call night coverage person covering after 7pm

## 2018-06-29 NOTE — Progress Notes (Signed)
Progress Note  Patient Name: Randy [name] Date of Encounter: 06/29/2018  Primary Cardiologist: [name], MD   Subjective   Maintaining sinus rhythm, still on intravenous amiodarone.  Denies chest pain or dyspnea. No active bleeding.  Hemoglobin unchanged since yesterday.  Inpatient Medications    Scheduled Meds: . acetaminophen  650 mg Oral Once  . atorvastatin  40 mg Oral Daily  . chlorhexidine  15 mL Mouth Rinse BID  . Chlorhexidine Gluconate Cloth  6 each Topical Daily  . erythromycin  1 application Right Eye QHS  . feeding supplement (ENSURE ENLIVE)  237 mL Oral BID BM  . gatifloxacin  1 drop Right Eye QID  . HYDROcodone-acetaminophen  1 tablet Oral Q1400  . hydrocortisone  25 mg Rectal BID  . mouth rinse  15 mL Mouth Rinse q12n4p  . naloxegol oxalate  25 mg Oral Daily  . pantoprazole  40 mg Oral Daily  . polyvinyl alcohol  1 drop Both Eyes QID  . predniSONE  5 mg Oral BID WC  . sodium chloride flush  10-40 mL Intracatheter Q12H   Continuous Infusions: . sodium chloride 1,000 mL (06/28/18 1148)  . amiodarone 30 mg/hr (06/28/18 2356)  . ceFEPime (MAXIPIME) IV Stopped (06/29/18 0410)   PRN Meds: sodium chloride, acetaminophen **OR** acetaminophen, chlorpheniramine-HYDROcodone, HYDROcodone-acetaminophen, ondansetron **OR** ondansetron (ZOFRAN) IV, polyethylene glycol, sodium chloride flush, traZODone   Vital Signs    Vitals:   06/29/18 0600 06/29/18 0700 06/29/18 0800 06/29/18 0850  BP: 115/73 122/72 108/67   Pulse: 91 97 87   Resp: 17 17 (!) 21   Temp:    98.1 F (36.7 C)  TempSrc:    Oral  SpO2: 100% 100% 100%   Weight:      Height:        Intake/Output Summary (Last 24 hours) at 06/29/2018 0853 Last data filed at 06/29/2018 0800 Gross per 24 hour  Intake 1397.04 ml  Output 1450 ml  Net -52.96 ml   Last 3 Weights 06/21/2018 06/10/2018 05/28/2018  Weight (lbs) 142 lb 10.2 oz 141 lb 3.2 oz 143 lb  Weight (kg) 64.7 kg 64.048 kg 64.864 kg       Telemetry    Sinus rhythm- Personally Reviewed  ECG    No new tracing- Personally Reviewed  Physical Exam  Pale, cahectic GEN: No acute distress.   Neck: No JVD Cardiac: RRR, no murmurs, rubs, or gallops.  Respiratory: Clear to auscultation bilaterally. GI: Soft, nontender, non-distended  MS: No edema; No deformity. Neuro:  Nonfocal  Psych: Normal affect   Labs    Chemistry Recent Labs  Lab 06/24/18 0640  06/27/18 1237 06/28/18 0734 06/29/18 0342  NA 130*   < > 130* 130* 131*  K 3.8   < > 3.9 4.2 4.2  CL 98   < > 98 99 98  CO2 22   < > 25 24 25   GLUCOSE 196*   < > 144* 132* 131*  BUN 51*   < > 25* 29* 30*  CREATININE 0.74   < > 0.55* 0.54* 0.54*  CALCIUM 10.2   < > 10.1 9.3 9.8  PROT 6.5  --   --   --   --   ALBUMIN 3.3*  --   --   --   --   AST 30  --   --   --   --   ALT 33  --   --   --   --  ALKPHOS 91  --   --   --   --   BILITOT 0.8  --   --   --   --   GFRNONAA >60   < > >60 >60 >60  GFRAA >60   < > >60 >60 >60  ANIONGAP 10   < > 7 7 8    < > = values in this interval not displayed.     Hematology Recent Labs  Lab 06/27/18 1237 06/28/18 0734 06/29/18 0342  WBC 52.2* 47.5* 44.4*  RBC 3.29* 2.88* 2.87*  HGB 9.8* 8.6* 8.4*  HCT 31.0* 27.0* 26.6*  MCV 94.2 93.8 92.7  MCH 29.8 29.9 29.3  MCHC 31.6 31.9 31.6  RDW 19.0* 18.8* 18.8*  PLT 115* 108* 134*    Cardiac EnzymesNo results for input(s): TROPONINI in the last 168 hours. No results for input(s): TROPIPOC in the last 168 hours.   BNP Recent Labs  Lab 06/27/18 1258  BNP 312.9*     DDimer No results for input(s): DDIMER in the last 168 hours.   Radiology    No results found.  Cardiac Studies   11/01/2017 echo - Left ventricle: The cavity size was normal. There was mild   concentric hypertrophy. Systolic function was normal. The   estimated ejection fraction was in the range of 50% to 55%. Basal   inferolateral hypokinesis. Doppler parameters are consistent with   abnormal left  ventricular relaxation (grade 1 diastolic   dysfunction). - Aortic valve: There was no regurgitation. - Mitral valve: Calcified annulus. Mildly thickened leaflets .   There was trivial regurgitation. - Right ventricle: The cavity size was mildly dilated. Wall   thickness was normal. Systolic function was mildly reduced. - Right atrium: The atrium was normal in size. - Tricuspid valve: There was no regurgitation. - Pulmonary arteries: Systolic pressure was within the normal   range. - Inferior vena cava: The vessel was normal in size. - Pericardium, extracardiac: There was no pericardial effusion.  Patient Profile     73 y.o. male with CAD s/p NSTEMI and stent to left circumflex/angioplasty to OM1 (2017), history of right carotid endarterectomy, borderline LVEF 00%, chronic diastolic heart failure, diabetes mellitus, hyperlipidemia, hypertension who has stage IV non-small cell lung cancer and was admitted for anemia related to colonic diverticular bleeding.  Developed healthcare associated pneumonia and subsequently atrial flutter with 2: 1 AV block during the hospitalization, return to normal sinus rhythm on intravenous amiodarone.  Assessment & Plan    1. AFlutter: Telemetry normal rhythm, will transition to oral amiodarone with plan for "loading" at 400 mg daily for 2 weeks, then 200 mg daily.  Unable to start anticoagulation yet in the setting of recent GI bleed, but in the mid-long-term he would benefit from anticoagulation for stroke prevention. CHADSVasc 5 (age, CAD, HF, DM, HTN). 2. CAD: Asymptomatic despite anemia.  Aspirin is on hold.  On statin. 3. CHF: Due to diastolic function documented on previous echo, currently does not appear hypervolemic (he is lying fully supine in bed), avoid excessive volume expansion 4. HTN: Current blood pressure is in the low normal range 5. DM 6. Stage IV non-small cell lung cancer: Due to limited prognosis, amiodarone is the best option for  arrhythmia management.     For questions or updates, please contact [place] Please consult [email or web address] for contact info under        Signed, Sanda Klein, MD  06/29/2018, 8:53 AM

## 2018-06-30 DIAGNOSIS — Z7189 Other specified counseling: Secondary | ICD-10-CM

## 2018-06-30 LAB — CBC
HCT: 26.3 % — ABNORMAL LOW (ref 39.0–52.0)
Hemoglobin: 8.1 g/dL — ABNORMAL LOW (ref 13.0–17.0)
MCH: 29.3 pg (ref 26.0–34.0)
MCHC: 30.8 g/dL (ref 30.0–36.0)
MCV: 95.3 fL (ref 80.0–100.0)
Platelets: 133 10*3/uL — ABNORMAL LOW (ref 150–400)
RBC: 2.76 MIL/uL — ABNORMAL LOW (ref 4.22–5.81)
RDW: 19.1 % — AB (ref 11.5–15.5)
WBC: 41.6 10*3/uL — ABNORMAL HIGH (ref 4.0–10.5)
nRBC: 0 % (ref 0.0–0.2)

## 2018-06-30 LAB — BASIC METABOLIC PANEL
Anion gap: 8 (ref 5–15)
BUN: 32 mg/dL — ABNORMAL HIGH (ref 8–23)
CO2: 25 mmol/L (ref 22–32)
Calcium: 10 mg/dL (ref 8.9–10.3)
Chloride: 98 mmol/L (ref 98–111)
Creatinine, Ser: 0.57 mg/dL — ABNORMAL LOW (ref 0.61–1.24)
GFR calc Af Amer: >60 mL/min (ref >60)
GFR calc non Af Amer: >60 mL/min (ref >60)
Glucose, Bld: 122 mg/dL — ABNORMAL HIGH (ref 70–99)
Potassium: 4.2 mmol/L (ref 3.5–5.1)
Sodium: 131 mmol/L — ABNORMAL LOW (ref 135–145)

## 2018-06-30 LAB — GLUCOSE, CAPILLARY
GLUCOSE-CAPILLARY: 118 mg/dL — AB (ref 70–99)
Glucose-Capillary: 116 mg/dL — ABNORMAL HIGH (ref 70–99)
Glucose-Capillary: 97 mg/dL (ref 70–99)
Glucose-Capillary: 112 mg/dL — ABNORMAL HIGH (ref 70–99)

## 2018-06-30 MED ORDER — HYDROMORPHONE HCL 1 MG/ML IJ SOLN
0.5000 mg | INTRAMUSCULAR | Status: DC | PRN
  Administered 2018-06-30: 0.5 mg via INTRAVENOUS
  Administered 2018-06-30 – 2018-07-01 (×3): 1 mg via INTRAVENOUS
  Administered 2018-07-01 (×2): 0.5 mg via INTRAVENOUS
  Administered 2018-07-01 – 2018-07-02 (×6): 1 mg via INTRAVENOUS
  Filled 2018-06-30 (×12): qty 1

## 2018-06-30 MED ORDER — FUROSEMIDE 20 MG PO TABS
20.0000 mg | ORAL_TABLET | Freq: Every day | ORAL | Status: DC
  Administered 2018-06-30 – 2018-07-02 (×3): 20 mg via ORAL
  Filled 2018-06-30 (×3): qty 1

## 2018-06-30 NOTE — Progress Notes (Signed)
Pharmacy Antibiotic Note  Randy [name] is a 73 y.o. male admitted on 06/21/2018 with rectal bleed.  Hx of leukocytosis likely due to chronic Prednisone, NSCLC- stage 4 - unable to tolerate chemo. Hypotension worsening, WBC increased, concern for sepsis, Pharmacy has been consulted for  Cefepime dosing.  Today 06/30/18 9:52 AM   Afebrile  WBC 41.6  SCx with few Staph aureus  Plan: Continue cefepime 1 g iv q8h  F/U renal function and LOT  Height: 5\' 11"  (180.3 cm) Weight: 142 lb 10.2 oz (64.7 kg) IBW/kg (Calculated) : 75.3  Temp (24hrs), Avg:97.6 F (36.4 C), Min:97.4 F (36.3 C), Max:97.8 F (36.6 C)  Recent Labs  Lab 06/24/18 1304  06/25/18 0400 06/26/18 0348 06/27/18 1237 06/28/18 0734 06/29/18 0342 06/30/18 0523  WBC  --    < > 51.5* 44.1* 52.2* 47.5* 44.4* 41.6*  CREATININE  --   --  0.61 0.50* 0.55* 0.54* 0.54* 0.57*  LATICACIDVEN 2.0*  --  1.5  --   --   --   --   --    < > = values in this interval not displayed.    Estimated Creatinine Clearance: 76.4 mL/min (A) (by C-G formula based on SCr of 0.57 mg/dL (L)).    Allergies  Allergen Reactions  . Brilinta [Ticagrelor] Shortness Of Breath   Antimicrobials this admission: 1/27 Cefepime >>  1/27 Vanc >> 1/27  Dose adjustments this admission:  Microbiology results: 1/28 SCx: few S aurueus,pan sens 1/27 BCx: ngtd 1/27 UCx: ngf 1/24 BCx: ngf 1/24 MRSA PCR: negative  Thank you for allowing pharmacy to be a part of this patient's care.  Dolly Rias RPh 06/30/2018, 9:53 AM Pager [phone]

## 2018-06-30 NOTE — Progress Notes (Signed)
Progress Note  Patient Name: Randy [name] Date of Encounter: 06/30/2018  Primary Cardiologist: [name], MD   Subjective   Maintaining normal sinus rhythm.  Now on oral amiodarone. Echo was a difficult study but shows preserved left ventricular systolic function.  Inpatient Medications    Scheduled Meds: . acetaminophen  650 mg Oral Once  . [START ON 07/13/2018] amiodarone  200 mg Oral Daily  . amiodarone  400 mg Oral Daily  . atorvastatin  40 mg Oral Daily  . chlorhexidine  15 mL Mouth Rinse BID  . Chlorhexidine Gluconate Cloth  6 each Topical Daily  . erythromycin  1 application Right Eye QHS  . feeding supplement (ENSURE ENLIVE)  237 mL Oral BID BM  . furosemide  20 mg Oral Daily  . gatifloxacin  1 drop Right Eye QID  . HYDROcodone-acetaminophen  1 tablet Oral Q1400  . hydrocortisone  25 mg Rectal BID  . mouth rinse  15 mL Mouth Rinse q12n4p  . naloxegol oxalate  25 mg Oral Daily  . pantoprazole  40 mg Oral Daily  . polyvinyl alcohol  1 drop Both Eyes QID  . predniSONE  5 mg Oral BID WC  . sodium chloride flush  10-40 mL Intracatheter Q12H   Continuous Infusions: . sodium chloride 1,000 mL (06/28/18 1148)  . ceFEPime (MAXIPIME) IV Stopped (06/30/18 0713)   PRN Meds: sodium chloride, acetaminophen **OR** acetaminophen, chlorpheniramine-HYDROcodone, HYDROcodone-acetaminophen, ondansetron **OR** ondansetron (ZOFRAN) IV, polyethylene glycol, sodium chloride flush, traZODone   Vital Signs    Vitals:   06/30/18 0000 06/30/18 0400 06/30/18 0700 06/30/18 0731  BP: 120/69 (!) 107/59 (!) 121/56   Pulse: 84 86 86   Resp: 20 15 20    Temp:    (!) 97.5 F (36.4 C)  TempSrc:    Oral  SpO2: 100% 100% 98%   Weight:      Height:        Intake/Output Summary (Last 24 hours) at 06/30/2018 0827 Last data filed at 06/30/2018 0600 Gross per 24 hour  Intake 100 ml  Output 1000 ml  Net -900 ml   Last 3 Weights 06/21/2018 06/10/2018 05/28/2018  Weight (lbs) 142 lb 10.2 oz 141  lb 3.2 oz 143 lb  Weight (kg) 64.7 kg 64.048 kg 64.864 kg      Telemetry    Sinus rhythm- Personally Reviewed  ECG    No new tracing - Personally Reviewed  Physical Exam  Cachectic, drowsy GEN: No acute distress.   Neck: No JVD Cardiac: RRR, no murmurs, rubs, or gallops.  Respiratory: Clear to auscultation bilaterally. GI: Soft, nontender, non-distended  MS: No edema; No deformity. Neuro:  Nonfocal  Psych: Normal affect   Labs    Chemistry Recent Labs  Lab 06/24/18 0640  06/28/18 0734 06/29/18 0342 06/30/18 0523  NA 130*   < > 130* 131* 131*  K 3.8   < > 4.2 4.2 4.2  CL 98   < > 99 98 98  CO2 22   < > 24 25 25   GLUCOSE 196*   < > 132* 131* 122*  BUN 51*   < > 29* 30* 32*  CREATININE 0.74   < > 0.54* 0.54* 0.57*  CALCIUM 10.2   < > 9.3 9.8 10.0  PROT 6.5  --   --   --   --   ALBUMIN 3.3*  --   --   --   --   AST 30  --   --   --   --  ALT 33  --   --   --   --   ALKPHOS 91  --   --   --   --   BILITOT 0.8  --   --   --   --   GFRNONAA >60   < > >60 >60 >60  GFRAA >60   < > >60 >60 >60  ANIONGAP 10   < > 7 8 8    < > = values in this interval not displayed.     Hematology Recent Labs  Lab 06/28/18 0734 06/29/18 0342 06/30/18 0523  WBC 47.5* 44.4* 41.6*  RBC 2.88* 2.87* 2.76*  HGB 8.6* 8.4* 8.1*  HCT 27.0* 26.6* 26.3*  MCV 93.8 92.7 95.3  MCH 29.9 29.3 29.3  MCHC 31.9 31.6 30.8  RDW 18.8* 18.8* 19.1*  PLT 108* 134* 133*    Cardiac EnzymesNo results for input(s): TROPONINI in the last 168 hours. No results for input(s): TROPIPOC in the last 168 hours.   BNP Recent Labs  Lab 06/27/18 1258  BNP 312.9*     DDimer No results for input(s): DDIMER in the last 168 hours.   Radiology    No results found.  Cardiac Studies   06/29/2018 echo  1. Noemal left ventricular size and systolic function. Mild concentric left ventricular hypertrophy.  EF 55-60%.  2. There is probable mild hypokinesis of the basal inferior and inferolateral left  ventricular segments.  3. Mildly dilated right ventricular with normal systolic function.  4. Mildly dilated left atrial size.  5. Mildly dilated right atrial size.  6. There is moderate mitral annular calcification present. No significant mitral stenosis or insufficiency.  7. Normal tricuspid valve.  8. The aortic valve is tricuspid. There is mild thickening and sclerosis without any evidence of stenosis of the aortic valve.  9. The interatrial septum was not assessed.  Patient Profile     73 y.o. male with CAD s/p NSTEMI and stent to left circumflex/angioplasty to OM1 (2017), history of right carotid endarterectomy, borderline LVEF 59%, chronic diastolic heart failure, diabetes mellitus, hyperlipidemia, hypertension who has stage IV non-small cell lung cancer and was admitted for anemia related to colonic diverticular bleeding.  Developed healthcare associated pneumonia and subsequently atrial flutter with 2:1 AV block during the hospitalization, return to normal sinus rhythm on intravenous amiodarone.  Assessment & Plan    1. AFlutter:  Continue amiodarone p.o. 400 mg daily for 2 weeks, then 200 mg daily.  In view of recent GI bleed it is premature to consider anticoagulation. CHADSVasc 5 (age, CAD, HF, DM, HTN).  Since his prognosis is very limited in time, may never be a good candidate for anticoagulation. 2. CAD:  He never developed angina despite severe anemia.  Aspirin is also currently on hold but can be restarted if there is no recurrent bleeding.  On statin. 3. CHF:  Evidence of diastolic dysfunction on previous echo.  He does not appear volume overloaded either clinically or by echo. 4. HTN:  Blood pressure in low normal range. 5. DM 6. Stage IV non-small cell lung cancer:  Prognosis is poor.  He has very poor functional status and has developed cachexia.  Palliative care should be strongly considered.     [facility] will sign off.   Medication Recommendations: Amiodarone 400  mg daily, then 200 mg daily starting February 15.  For the time being he should not receive anticoagulant or antiplatelet agents.  Can resume aspirin if there is convincing evidence that  bleeding has stopped. Other recommendations (labs, testing, etc): As long as he is on amiodarone should have liver and thyroid function tests at least every 6 months. Follow up as an outpatient: When he is ready for discharge, please arrange follow-up in clinic in 1 month, unless the decision is made to pursue comfort care.  For questions or updates, please contact [place] Please consult [email or web address] for contact info under        Signed, Sanda Klein, MD  06/30/2018, 8:27 AM  \

## 2018-06-30 NOTE — Plan of Care (Signed)
Patient up to chair with 2 max assist, unable to get back in bed after sitting in chair for approximately 2 hours, used lift to get patient back to bed.  Patient continues to c/o pain in his abdomen, eating minimally because he states it makes his stomach hurt.  Palliative MD aware of above and did make changes to pain regimen.  Patient with 2 very dark liquid stools after arriving on [address] [place].  Multiple family members in room.  Remains on 2 liters via nasal cannula with sats in mid 90's.

## 2018-06-30 NOTE — Progress Notes (Signed)
Physical Therapy Treatment Patient Details Name: Randy [name] MRN: [id number] DOB: [birth year] Today's Date: 06/30/2018    History of Present Illness 73 yo male admitted 06/21/18  with GI bleed. H/O ling cancer, DM,  CAD, coronary Stent placed.    PT Comments    Pt cooperative but apprehensive, "I haven't stood up for a long time".  Pt requiring increased time and rest breaks but able to balance at EOB unassisted, stand and ambulate 2' to transfer to recliner - HR elevated to 109 with SaO2 maintained at 90% or higher on 2L and no c/o dizziness.   Follow Up Recommendations  SNF     Equipment Recommendations  None recommended by PT    Recommendations for Other Services       Precautions / Restrictions Precautions Precautions: Fall Precaution Comments: monito BP and sats, rR > L feet are purple, blister on Top of R gr. toe. ABI's normal Restrictions Weight Bearing Restrictions: No    Mobility  Bed Mobility Overal bed mobility: Needs Assistance Bed Mobility: Rolling;Sidelying to Sit Rolling: Mod assist Sidelying to sit: Mod assist          Transfers Overall transfer level: Needs assistance Equipment used: Rolling walker (2 wheeled) Transfers: Sit to/from [employer] Transfers Sit to Stand: Min assist;+2 safety/equipment;From elevated surface Stand pivot transfers: Min assist;Mod assist;+2 physical assistance       General transfer comment: cues for posture and position from RW  Ambulation/Gait Ambulation/Gait assistance: Min assist;Mod assist Gait Distance (Feet): 2 Feet Assistive device: Rolling walker (2 wheeled) Gait Pattern/deviations: Step-to pattern;Decreased step length - right;Decreased step length - left;Shuffle;Trunk flexed Gait velocity: decr   General Gait Details: cues for sequence, posture and position from RW.     Stairs             Wheelchair Mobility    Modified Rankin (Stroke Patients Only)       Balance                                             Cognition Arousal/Alertness: Awake/alert Behavior During Therapy: WFL for tasks assessed/performed Overall Cognitive Status: Within Functional Limits for tasks assessed                                        Exercises      General Comments General comments (skin integrity, edema, etc.): SaO2 on 2L 90% or higher; HR elevated to 109      Pertinent Vitals/Pain Pain Assessment: Faces Faces Pain Scale: Hurts a little bit Pain Location: R foot with WB Pain Descriptors / Indicators: Sore Pain Intervention(s): Limited activity within patient's tolerance;Monitored during session    Home Living                      Prior Function            PT Goals (current goals can now be found in the care plan section) Acute Rehab PT Goals Patient Stated Goal: regain strength PT Goal Formulation: With patient Time For Goal Achievement: 07/09/18 Potential to Achieve Goals: Fair Progress towards PT goals: Progressing toward goals    Frequency    Min 2X/week      PT Plan Current plan remains appropriate    Co-evaluation  AM-PAC PT "6 Clicks" Mobility   Outcome Measure  Help needed turning from your back to your side while in a flat bed without using bedrails?: A Lot Help needed moving from lying on your back to sitting on the side of a flat bed without using bedrails?: A Lot Help needed moving to and from a bed to a chair (including a wheelchair)?: A Lot Help needed standing up from a chair using your arms (e.g., wheelchair or bedside chair)?: A Lot Help needed to walk in hospital room?: A Lot Help needed climbing 3-5 steps with a railing? : Total 6 Click Score: 11    End of Session Equipment Utilized During Treatment: Gait belt Activity Tolerance: Patient limited by fatigue Patient left: in chair;with call bell/phone within reach;with family/visitor present;with chair alarm set Nurse  Communication: Mobility status PT Visit Diagnosis: Unsteadiness on feet (R26.81)     Time: 5638-9373 PT Time Calculation (min) (ACUTE ONLY): 29 min  Charges:  $Gait Training: 8-22 mins $Therapeutic Exercise: 8-22 mins                     [place] Pager [phone] Office [phone]    [facility] 06/30/2018, 4:37 PM

## 2018-06-30 NOTE — Progress Notes (Addendum)
[facility]                                                                              Patient Demographics  Randy [name], is a 73 y.o. male, DOB - 1945-11-22, ZPH:[id number]  Admit date - 06/21/2018   Admitting Physician Randy Milan, MD  Outpatient Primary MD for the patient is Randy Dredge, PA-C  Outpatient specialists:   LOS - 9  days   Medical records reviewed and are as summarized below:    Chief Complaint  Patient presents with  . 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  Patient on admission noted to have a hemoglobin of 7.9 from 11.9 on 06/10/2018.  Patient transfused a unit of packed red blood cells however had ongoing rectal bleeding the night of 06/21/2018 with 7 episodes of bloody bowel movements with hemoglobin dropping down to 7.2 despite a unit of packed red blood cell transfusion.  GI was consulted and felt that patient's anemia/bleed could likely be multifactorial secondary to internal hemorrhoid hemorrhage versus rectal stercoral ulcer.  Assessment & Plan    Principal Problem: GI bleed/hematochezia secondary to presumed diverticular bleed -History of constipation likely opioid induced on Movantik and MiraLAX as needed at home.  Prior to admission patient had significant constipation with concerns for impaction, hence there was concern for hemorrhoidal bleed versus stercoral ulcer -On the night of 06/21/2018, patient had ongoing GI bleed with 7 bloody BMs, on  admission hemoglobin 7.9, received total of 3 units packed RBCs during the hospitalization -Patient underwent flexible sigmoidoscopy on 06/22/2018 which showed diverticulosis -GI, Randy [name] recommended continuation of Movantik, MiraLAX twice daily, rectal hydrocortisone suppositories twice daily. -Aspirin and Plavix on hold, received 1 packed RBC transfusion on 1/29 -Patient is currently stable  Atrial flutter with RVR and 2:1 block, new -On 06/27/2018 evening, patient went into rapid atrial flutter, given Cardizem 10 mg IV x1, with mild improvement in heart rate briefly however became hypotensive, digoxin 0.25 mg IV x1 with no effect, then placed on amiodarone drip -Currently normal sinus rhythm, continue oral amiodarone 400 mg daily for 2 weeks and then decrease down to 200 mg daily -Secondary to GI bleed, stage IV malignancy, cardiology recommended can resume aspirin if there is convincing evidence of bleeding has stopped.  For the time being no anticoagulation or antiplatelet agents.  Symptomatic anemia/acute blood loss anemia -Received total of 4 units packed RBCs till now -H&H stable  Healthcare associated pneumonia -On 06/24/2018, patient was found to be hypotensive with systolic BPs in 55V, worsening leukocytosis, high as 54.1 -Chest x-ray was concerning for pneumonia, initially placed on IV vancomycin and IV cefepime. -Urine strep antigen negative  Hypotension -Likely due to symptomatic anemia however  concern for sepsis due to worsening leukocytosis, pneumonia, hypotension.  BP currently stable  Hypothermia -Resolved likely due to sepsis, pneumonia -TSH 2.0  Diabetes mellitus type 2 Last hemoglobin A1c 6.7 on 03/29/2018.  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 - Patient being followed by Randy [name], last seen on 06/10/2018, felt patient may not be a good candidate for any treatment with his current status and recommended working on nutrition. - Palliative medicine was consulted, seen by Randy [name], goals of care addressed, DNR status  Dusky bluish coloration of the toes/feet -Concern for ischemic changes, poor perfusion due to significant GI bleed, Dopplers showed pulses on the right dorsalis pedis and tibialis, left tibialis -Patient had presented with GI bleed as such aspirin and Plavix currently on hold -ABIs done, patient seen by vascular surgery Randy [name], recommended no role for vascular surgical intervention can follow-up on an as-needed basis.  Constipation Restart Movantik, continue MiraLAX   Leukocytosis Likely due to pneumonia on chest x-ray 1/27 - WBC count improving, MRSA PCR negative -Continue IV cefepime  Hyperlipidemia Continue statin  CAD with grade 2 diastolic dysfunction Currently no chest pain, continue to hold aspirin and Plavix - stable, placed on low-dose Lasix 20 mg daily  Severe protein calorie malnutrition Likely due to progressive stage IV lung CA Patient received IV albumin during this hospitalization, albumin level 3.3  Stage I pressure injury to coccyx Wound care consulted  Goals of care -Spoke at length with patient's daughter at the bedside, overall very poor prognosis with stage IV non-small cell lung cancer, performance/functional status poor, severe protein calorie malnutrition, palliative medicine has seen the patient.  Patient however does not want hospice, aware of his terminal prognosis but wants to " continue" for his family. - will transfer out to the floor, start PT OT.  Code Status: *DNR DVT Prophylaxis:  SCDs Family Communication: Discussed in detail with the patient, all imaging results, lab results explained to the patient and daughter at bedside   Disposition Plan: PT evaluation once stable to assess safety and disposition needs  Time Spent in minutes   40mins, d/w patient and daughter regarding goals of care  Procedures:   Chest x-ray 06/21/2018, 06/22/2018, 06/24/2018  1 unit packed red blood cells 06/21/2018  2 units packed red blood cells 06/22/2018  Flexible sigmoidoscopy per Randy [name] 06/22/2018  ABIs 06/23/2018  Consultants:    Gastroenterology: Dr. Silverio Decamp 06/21/2018  Palliative care: RandyAnwar 06/22/2018 Vascular surgery: Dr. Donzetta Matters 06/23/2018* Cardiology, Dr. Radford Pax 1/31  Antimicrobials:   Anti-infectives (From admission, onward)   Start     Dose/Rate Route Frequency Ordered Stop   06/24/18 2359  vancomycin (VANCOCIN) IVPB 750 mg/150 ml premix  Status:  Discontinued     750 mg 150 mL/hr over 60 Minutes Intravenous Every 12 hours 06/24/18 1109 06/25/18 1002   06/24/18 2000  ceFEPIme (MAXIPIME) 1 g in sodium chloride 0.9 % 100 mL IVPB     1 g 200 mL/hr over 30 Minutes Intravenous Every 8 hours 06/24/18 1059     06/24/18 1200  vancomycin (VANCOCIN) 1,250 mg in sodium chloride 0.9 % 250 mL IVPB     1,250 mg 166.7 mL/hr  over 90 Minutes Intravenous  Once 06/24/18 1102 06/24/18 1423   06/24/18 1130  ceFEPIme (MAXIPIME) 2 g in sodium chloride 0.9 % 100 mL IVPB     2 g 200 mL/hr over 30 Minutes Intravenous  Once 06/24/18 1059 06/24/18 1502         Medications  Scheduled Meds: . acetaminophen  650 mg Oral Once  . [START ON 07/13/2018] amiodarone  200 mg Oral Daily  . amiodarone  400 mg Oral Daily  . atorvastatin  40 mg Oral Daily  . chlorhexidine  15 mL Mouth Rinse BID  . Chlorhexidine Gluconate Cloth  6 each Topical Daily  . erythromycin  1 application Right Eye QHS  . feeding supplement (ENSURE ENLIVE)  237 mL Oral BID BM  . furosemide  20 mg Oral Daily  .  gatifloxacin  1 drop Right Eye QID  . HYDROcodone-acetaminophen  1 tablet Oral Q1400  . hydrocortisone  25 mg Rectal BID  . mouth rinse  15 mL Mouth Rinse q12n4p  . naloxegol oxalate  25 mg Oral Daily  . pantoprazole  40 mg Oral Daily  . polyvinyl alcohol  1 drop Both Eyes QID  . predniSONE  5 mg Oral BID WC  . sodium chloride flush  10-40 mL Intracatheter Q12H   Continuous Infusions: . sodium chloride 1,000 mL (06/28/18 1148)  . ceFEPime (MAXIPIME) IV Stopped (06/30/18 0713)   PRN Meds:.sodium chloride, acetaminophen **OR** acetaminophen, chlorpheniramine-HYDROcodone, HYDROcodone-acetaminophen, ondansetron **OR** ondansetron (ZOFRAN) IV, polyethylene glycol, sodium chloride flush, traZODone      Subjective:   Randy [name] was seen and examined today.  Normal sinus rhythm, feels somewhat better, no chest pain or shortness of breath.  Amiodarone drip off.  Denies nausea, vomiting, abdominal pain, any new weakness.  No fevers  Objective:   Vitals:   06/30/18 0000 06/30/18 0400 06/30/18 0700 06/30/18 0731  BP: 120/69 (!) 107/59 (!) 121/56   Pulse: 84 86 86   Resp: 20 15 20    Temp:    (!) 97.5 F (36.4 C)  TempSrc:    Oral  SpO2: 100% 100% 98%   Weight:      Height:        Intake/Output Summary (Last 24 hours) at 06/30/2018 1049 Last data filed at 06/30/2018 0600 Gross per 24 hour  Intake 100 ml  Output 1000 ml  Net -900 ml     Wt Readings from Last 3 Encounters:  06/21/18 64.7 kg  06/10/18 64 kg  05/28/18 64.9 kg   Physical Exam  General: Alert and oriented x 3, cachectic, ill-appearing  Eyes:   HEENT:    Cardiovascular: S1 S2 clear,RRR. 2+ pedal edema b/l  Respiratory: CTAB, no wheezing, rales or rhonchi  Gastrointestinal: Soft, nontender, nondistended, NBS  Ext: 2+ pedal edema bilaterally  Neuro: no new deficits  Musculoskeletal: No cyanosis, clubbing  Skin: No rashes  Psych: Normal affect and demeanor, alert and oriented x3       Data  Reviewed:  I have personally reviewed following labs and imaging studies  Micro Results Recent Results (from the past 240 hour(s))  Blood culture (routine x 2)     Status: None   Collection Time: 06/21/18  1:05 PM  Result Value Ref Range Status   Specimen Description   Final    BLOOD PORTA CATH Performed at [facility] [address] [place], [place] [address]    Special Requests   Final    BOTTLES DRAWN AEROBIC AND ANAEROBIC  Blood Culture adequate volume Performed at [facility] [address], [place], [place] [address]    Culture   Final    NO GROWTH 5 DAYS Performed at [facility], [place] [address], [place], [place] [address]    Report Status 06/26/2018 FINAL  Final  Blood culture (routine x 2)     Status: None   Collection Time: 06/21/18  1:05 PM  Result Value Ref Range Status   Specimen Description   Final    BLOOD PORT Performed at [facility] [address], [place], [place] [address]    Special Requests   Final    BOTTLES DRAWN AEROBIC AND ANAEROBIC Blood Culture adequate volume Performed at [facility] [address], [place], [place] [address]    Culture   Final    NO GROWTH 5 DAYS Performed at [facility], [place] [address] [place], [place] [address]    Report Status 06/26/2018 FINAL  Final  MRSA PCR Screening     Status: None   Collection Time: 06/21/18  9:14 PM  Result Value Ref Range Status   MRSA by PCR NEGATIVE NEGATIVE Final    Comment:        The GeneXpert MRSA Assay (FDA approved for NASAL specimens only), is one component of a comprehensive MRSA colonization surveillance program. It is not intended to diagnose MRSA infection nor to guide or monitor treatment for MRSA infections. Performed at [facility], [place] [address] [place], [place] [address]   Culture, blood (Routine X 2) w Reflex to ID Panel     Status: None   Collection  Time: 06/24/18 10:33 AM  Result Value Ref Range Status   Specimen Description LEFT ANTECUBITAL  Final   Special Requests   Final    BOTTLES DRAWN AEROBIC AND ANAEROBIC Blood Culture adequate volume Performed at [facility] [address], [place], [place] [address]    Culture NO GROWTH 5 DAYS  Final   Report Status 06/29/2018 FINAL  Final  Culture, blood (Routine X 2) w Reflex to ID Panel     Status: None   Collection Time: 06/24/18 11:07 AM  Result Value Ref Range Status   Specimen Description BLOOD RIGHT HAND  Final   Special Requests   Final    BOTTLES DRAWN AEROBIC ONLY Blood Culture adequate volume Performed at [facility] [address], [place], [place] [address]    Culture NO GROWTH 5 DAYS  Final   Report Status 06/29/2018 FINAL  Final  Culture, Urine     Status: None   Collection Time: 06/24/18  1:05 PM  Result Value Ref Range Status   Specimen Description   Final    URINE, RANDOM Performed at [facility] [address] [place], [place] [address]    Special Requests   Final    NONE Performed at [facility], [place] [address], [place], [place] [address]    Culture   Final    NO GROWTH Performed at [facility], [place] [address] [place], [place] [address]    Report Status 06/25/2018 FINAL  Final  Culture, sputum-assessment     Status: None   Collection Time: 06/25/18  9:41 PM  Result Value Ref Range Status   Specimen Description SPUTUM  Final   Special Requests NONE  Final   Sputum evaluation   Final    THIS SPECIMEN  IS ACCEPTABLE FOR SPUTUM CULTURE Performed at [facility], [place] [address] [place], [place] [address]    Report Status 06/25/2018 FINAL  Final  Culture, respiratory     Status: None   Collection Time: 06/25/18  9:41 PM  Result Value Ref Range Status   Specimen Description   Final    SPUTUM Performed at [facility]  [address] [place], [place] [address]    Special Requests   Final    NONE Reflexed from [id number] Performed at [facility], [place] [address], [place], [place] [address]    Gram Stain   Final    ABUNDANT WBC PRESENT,BOTH PMN AND MONONUCLEAR FEW GRAM VARIABLE ROD FEW GRAM NEGATIVE RODS FEW GRAM POSITIVE COCCI ABUNDANT SQUAMOUS EPITHELIAL CELLS PRESENT Performed at [facility], [place] [address] [place], [place] [address]    Culture FEW STAPHYLOCOCCUS AUREUS  Final   Report Status 06/28/2018 FINAL  Final   Organism ID, Bacteria STAPHYLOCOCCUS AUREUS  Final      Susceptibility   Staphylococcus aureus - MIC*    CIPROFLOXACIN <=0.5 SENSITIVE Sensitive     ERYTHROMYCIN <=0.25 SENSITIVE Sensitive     GENTAMICIN <=0.5 SENSITIVE Sensitive     OXACILLIN 0.5 SENSITIVE Sensitive     TETRACYCLINE <=1 SENSITIVE Sensitive     VANCOMYCIN <=0.5 SENSITIVE Sensitive     TRIMETH/SULFA <=10 SENSITIVE Sensitive     CLINDAMYCIN <=0.25 SENSITIVE Sensitive     RIFAMPIN <=0.5 SENSITIVE Sensitive     Inducible Clindamycin NEGATIVE Sensitive     * FEW STAPHYLOCOCCUS AUREUS    Radiology Reports Dg Chest 2 View  Result Date: 06/21/2018 CLINICAL DATA:  History of lung cancer.  Weight loss. EXAM: CHEST - 2 VIEW COMPARISON:  March 12, 2017 FINDINGS: The heart size is normal. Right central venous line is identified in the superior vena cava. There is right perihilar and hilar mass progressed compared to prior chest x-ray of 2018 consistent with patient's known lung cancer. The left lung is clear. Nipple shadow is identified in the left lung base. The visualized skeletal structures are stable. IMPRESSION: Right perihilar and hilar mass progressed compared to prior chest x-ray of 2018 consistent with patient's known lung cancer. No focal pneumonia is noted. Electronically Signed   By: Abelardo Diesel M.D.   On: 06/21/2018 10:27   Dg Chest Port 1 View  Result Date: 06/24/2018 CLINICAL DATA:   Leukocytosis. EXAM: PORTABLE CHEST 1 VIEW COMPARISON:  06/22/2018 FINDINGS: Right chest wall port a catheter is identified with tip in the right atrium. Extensive right lung post treatment changes with volume loss and asymmetric elevation of the right hemidiaphragm again noted. Similar appearance of perihilar masslike architectural distortion within the right lung. Asymmetric airspace opacity within the right lung base is new from the previous exam and may represent early pneumonia. The left lung appears clear. IMPRESSION: 1. New opacity within the right lung base compared with 06/22/2018 may represent a small early pneumonia. Electronically Signed   By: Kerby Moors M.D.   On: 06/24/2018 12:03   Dg Chest Port 1 View  Result Date: 06/22/2018 CLINICAL DATA:  Shortness of breath this morning. History of diabetes, lung cancer, hypertension and coronary artery disease. EXAM: PORTABLE CHEST 1 VIEW COMPARISON:  Chest x-ray dated 06/21/2018. FINDINGS: Heart size and mediastinal contours are stable. Stable RIGHT perihilar opacity, corresponding to patient's known history of lung cancer, better demonstrated on earlier chest CT of 10/19/2017. Stable volume loss  on the RIGHT. LEFT lung remains clear. No pleural effusion or pneumothorax seen. RIGHT-sided Port-A-Cath is stable in position with tip overlying the lower SVC/cavoatrial junction. IMPRESSION: No change compared to yesterday's chest x-ray. Known RIGHT perihilar cancer. No evidence of pneumonia or pulmonary edema. Electronically Signed   By: Franki Cabot M.D.   On: 06/22/2018 10:32   Vas [place] Burnard Bunting With/wo Tbi  Result Date: 06/25/2018 LOWER EXTREMITY DOPPLER STUDY Indications: Discolored toes.  Performing Technologist: Carlos Levering RVT  Examination Guidelines: A complete evaluation includes at minimum, Doppler waveform signals and systolic blood pressure reading at the level of bilateral brachial, anterior tibial, and posterior tibial arteries, when vessel  segments are accessible. Bilateral testing is considered an integral part of a complete examination. Photoelectric Plethysmograph (PPG) waveforms and toe systolic pressure readings are included as required and additional duplex testing as needed. Limited examinations for reoccurring indications may be performed as noted.  ABI Findings: +--------+------------------+-----+---------+--------+ Right   Rt Pressure (mmHg)IndexWaveform Comment  +--------+------------------+-----+---------+--------+ [id number]                    triphasic         +--------+------------------+-----+---------+--------+ PTA     139               1.05 biphasic          +--------+------------------+-----+---------+--------+ DP      113               0.85 biphasic          +--------+------------------+-----+---------+--------+ +--------+------------------+-----+---------+-------+ Left    Lt Pressure (mmHg)IndexWaveform Comment +--------+------------------+-----+---------+-------+ [id number]                    triphasic        +--------+------------------+-----+---------+-------+ PTA     145               1.09 biphasic         +--------+------------------+-----+---------+-------+ DP      134               1.01 biphasic         +--------+------------------+-----+---------+-------+ +-------+-----------+-----------+------------+------------+ ABI/TBIToday's ABIToday's TBIPrevious ABIPrevious TBI +-------+-----------+-----------+------------+------------+ Right  1.05                                           +-------+-----------+-----------+------------+------------+ Left   1.09                                           +-------+-----------+-----------+------------+------------+  Summary: Right: Resting right ankle-brachial index is within normal range. No evidence of significant right lower extremity arterial disease. Left: Resting left ankle-brachial index is within normal range. No  evidence of significant left lower extremity arterial disease.  *See table(s) above for measurements and observations.  Electronically signed by Ruta Hinds MD on 06/25/2018 at 11:52:37 AM.   Final     Lab Data:  CBC: Recent Labs  Lab 06/24/18 0640  06/24/18 1538 06/25/18 0400 06/26/18 0348 06/27/18 1237 06/28/18 0734 06/29/18 0342 06/30/18 0523  WBC 53.9*  --  54.1* 51.5* 44.1* 52.2* 47.5* 44.4* 41.6*  NEUTROABS 47.8*  --  47.6* 45.4* 39.4*  --   --   --   --   HGB 8.9*   < >  8.6* 8.3* 7.5* 9.8* 8.6* 8.4* 8.1*  HCT 27.7*   < > 27.0* 26.3* 23.9* 31.0* 27.0* 26.6* 26.3*  MCV 90.5  --  90.0 90.7 93.4 94.2 93.8 92.7 95.3  PLT 120*  --  119* 117* 101* 115* 108* 134* 133*   < > = values in this interval not displayed.   Basic Metabolic Panel: Recent Labs  Lab 06/24/18 0640  06/26/18 0348 06/27/18 1237 06/28/18 0734 06/29/18 0342 06/30/18 0523  NA 130*   < > 128* 130* 130* 131* 131*  K 3.8   < > 4.2 3.9 4.2 4.2 4.2  CL 98   < > 98 98 99 98 98  CO2 22   < > 24 25 24 25 25   GLUCOSE 196*   < > 116* 144* 132* 131* 122*  BUN 51*   < > 23 25* 29* 30* 32*  CREATININE 0.74   < > 0.50* 0.55* 0.54* 0.54* 0.57*  CALCIUM 10.2   < > 10.4* 10.1 9.3 9.8 10.0  MG 2.0  --   --   --  1.9  --   --   PHOS 2.5  --   --   --   --   --   --    < > = values in this interval not displayed.   GFR: Estimated Creatinine Clearance: 76.4 mL/min (A) (by C-G formula based on SCr of 0.57 mg/dL (L)). Liver Function Tests: Recent Labs  Lab 06/24/18 0640  AST 30  ALT 33  ALKPHOS 91  BILITOT 0.8  PROT 6.5  ALBUMIN 3.3*   No results for input(s): LIPASE, AMYLASE in the last 168 hours. No results for input(s): AMMONIA in the last 168 hours. Coagulation Profile: No results for input(s): INR, PROTIME in the last 168 hours. Cardiac Enzymes: No results for input(s): CKTOTAL, CKMB, CKMBINDEX, TROPONINI in the last 168 hours. BNP (last 3 results) No results for input(s): PROBNP in the last 8760  hours. HbA1C: No results for input(s): HGBA1C in the last 72 hours. CBG: Recent Labs  Lab 06/29/18 0732 06/29/18 1237 06/29/18 1710 06/29/18 2118 06/30/18 0748  GLUCAP 112* 117* 142* 163* 116*   Lipid Profile: No results for input(s): CHOL, HDL, LDLCALC, TRIG, CHOLHDL, LDLDIRECT in the last 72 hours. Thyroid Function Tests: No results for input(s): TSH, T4TOTAL, FREET4, T3FREE, THYROIDAB in the last 72 hours. Anemia Panel: No results for input(s): VITAMINB12, FOLATE, FERRITIN, TIBC, IRON, RETICCTPCT in the last 72 hours. Urine analysis:    Component Value Date/Time   COLORURINE YELLOW 06/24/2018 1305   APPEARANCEUR CLEAR 06/24/2018 1305   LABSPEC 1.023 06/24/2018 1305   PHURINE 5.0 06/24/2018 1305   GLUCOSEU NEGATIVE 06/24/2018 1305   HGBUR SMALL (A) 06/24/2018 1305   BILIRUBINUR NEGATIVE 06/24/2018 1305   KETONESUR NEGATIVE 06/24/2018 1305   PROTEINUR 30 (A) 06/24/2018 1305   NITRITE NEGATIVE 06/24/2018 1305   LEUKOCYTESUR NEGATIVE 06/24/2018 1305     Ripudeep Rai M.D. [facility] 06/30/2018, 10:49 AM  Pager: [phone] Between 7am to 7pm - call Pager - [phone]  After 7pm go to [email or web address] - password [email or web address]  Call night coverage person covering after 7pm

## 2018-07-01 ENCOUNTER — Ambulatory Visit: Payer: Medicare Other | Admitting: Physician Assistant

## 2018-07-01 LAB — CBC
HCT: 26.9 % — ABNORMAL LOW (ref 39.0–52.0)
Hemoglobin: 8.1 g/dL — ABNORMAL LOW (ref 13.0–17.0)
MCH: 28.4 pg (ref 26.0–34.0)
MCHC: 30.1 g/dL (ref 30.0–36.0)
MCV: 94.4 fL (ref 80.0–100.0)
PLATELETS: 138 10*3/uL — AB (ref 150–400)
RBC: 2.85 MIL/uL — ABNORMAL LOW (ref 4.22–5.81)
RDW: 18.6 % — ABNORMAL HIGH (ref 11.5–15.5)
WBC: 38.6 10*3/uL — ABNORMAL HIGH (ref 4.0–10.5)
nRBC: 0 % (ref 0.0–0.2)

## 2018-07-01 LAB — BASIC METABOLIC PANEL
Anion gap: 6 (ref 5–15)
BUN: 30 mg/dL — ABNORMAL HIGH (ref 8–23)
CO2: 28 mmol/L (ref 22–32)
Calcium: 9.8 mg/dL (ref 8.9–10.3)
Chloride: 98 mmol/L (ref 98–111)
Creatinine, Ser: 0.54 mg/dL — ABNORMAL LOW (ref 0.61–1.24)
GFR calc non Af Amer: >60 mL/min (ref >60)
Glucose, Bld: 130 mg/dL — ABNORMAL HIGH (ref 70–99)
Potassium: 4.2 mmol/L (ref 3.5–5.1)
Sodium: 132 mmol/L — ABNORMAL LOW (ref 135–145)

## 2018-07-01 LAB — GLUCOSE, CAPILLARY
GLUCOSE-CAPILLARY: 133 mg/dL — AB (ref 70–99)
Glucose-Capillary: 111 mg/dL — ABNORMAL HIGH (ref 70–99)
Glucose-Capillary: 138 mg/dL — ABNORMAL HIGH (ref 70–99)
Glucose-Capillary: 128 mg/dL — ABNORMAL HIGH (ref 70–99)

## 2018-07-01 NOTE — Progress Notes (Signed)
Daily Progress Note   Patient Name: GINA COSTILLA       Date: 07/01/2018 DOB: 17-Nov-1945  Age: 73 y.o. MRN#: [id number] Attending Physician: Mendel Corning, MD Primary Care Physician: Kris Mouton Admit Date: 06/21/2018  Reason for Consultation/Follow-up: Establishing goals of care  Subjective: I met today with Mr. Jentz, 3 of his children, and his wife.  Initial encounter this AM was primarily with his wife and children as he was sleepy from pain medication.  We discussed options for care moving forward including home with hospice vs residential hospice placement.  His older children feel he would be best served by residential hospice placement and his wife is agreeable as long as Mr Eppinger is agreeable as well.  I met again with his family when he was awake and he reports that they have been discussing as a family and he would like to pursue residential hospice placement on discharge.  See below  Length of Stay: 10  Current Medications: Scheduled Meds:  . [START ON 07/13/2018] amiodarone  200 mg Oral Daily  . amiodarone  400 mg Oral Daily  . atorvastatin  40 mg Oral Daily  . chlorhexidine  15 mL Mouth Rinse BID  . Chlorhexidine Gluconate Cloth  6 each Topical Daily  . erythromycin  1 application Right Eye QHS  . feeding supplement (ENSURE ENLIVE)  237 mL Oral BID BM  . furosemide  20 mg Oral Daily  . gatifloxacin  1 drop Right Eye QID  . hydrocortisone  25 mg Rectal BID  . mouth rinse  15 mL Mouth Rinse q12n4p  . naloxegol oxalate  25 mg Oral Daily  . pantoprazole  40 mg Oral Daily  . polyvinyl alcohol  1 drop Both Eyes QID  . predniSONE  5 mg Oral BID WC  . sodium chloride flush  10-40 mL Intracatheter Q12H    Continuous Infusions: . sodium chloride  10 mL/hr at 07/01/18 0600  . ceFEPime (MAXIPIME) IV 1 g (07/01/18 1203)    PRN Meds: sodium chloride, acetaminophen **OR** acetaminophen, chlorpheniramine-HYDROcodone, HYDROmorphone (DILAUDID) injection, ondansetron **OR** ondansetron (ZOFRAN) IV, polyethylene glycol, sodium chloride flush, traZODone  Physical Exam         Awake alert No distress Oriented Non focal Extremities some what more warm to touch, still with dusky appearance  S 1 S 2  Regular breath sounds  Vital Signs: BP 108/64 (BP Location: Left Arm)   Pulse 89   Temp (!) 97.5 F (36.4 C) (Oral)   Resp (!) 22   Ht '5\' 11"'$  (1.803 m)   Wt 64.7 kg   SpO2 99%   BMI 19.89 kg/m  SpO2: SpO2: 99 % O2 Device: O2 Device: Nasal Cannula O2 Flow Rate: O2 Flow Rate (L/min): 2 L/min  Intake/output summary:   Intake/Output Summary (Last 24 hours) at 07/01/2018 1356 Last data filed at 07/01/2018 0600 Gross per 24 hour  Intake 460 ml  Output 975 ml  Net -515 ml   LBM: Last BM Date: 06/30/18 Baseline Weight: Weight: 64.7 kg Most recent weight: Weight: 64.7 kg       Palliative Assessment/Data:    Flowsheet Rows     Most Recent Value  Intake Tab  Referral Department  Hospitalist  Unit at Time of Referral  ER  Palliative Care Primary Diagnosis  Cancer  Date Notified  06/21/18  Palliative Care Type  New Palliative care  Reason for referral  Clarify Goals of Care  Date of Admission  06/21/18  Date first seen by Palliative Care  06/22/18  # of days Palliative referral response time  1 Day(s)  # of days IP prior to Palliative referral  0  Clinical Assessment  Psychosocial & Spiritual Assessment  Palliative Care Outcomes      Patient Active Problem List   Diagnosis Date Noted  . HCAP (healthcare-associated pneumonia) 06/25/2018  . Hypotension 06/24/2018  . Protein-calorie malnutrition, severe 06/23/2018  . Encounter for palliative care   . Pressure injury of skin 06/22/2018  . GI bleed 06/22/2018  . SOB  (shortness of breath)   . Palliative care by specialist   . Goals of care, counseling/discussion   . Hematochezia 06/21/2018  . Hyponatremia 06/21/2018  . Therapeutic opioid induced constipation 06/03/2018  . Failure to thrive syndrome, adult 06/03/2018  . Dyspepsia 04/04/2018  . 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 Abnormal weight loss 04/04/2018  . Stage IV squamous cell carcinoma of right lung ([place]) 11/12/2017  . Palliative care status 11/12/2017  . BMI 28.0-28.9,adult 09/27/2017  . Nodule of right external ear 09/26/2017  . Mild protein-calorie malnutrition ([place]) 09/26/2017  . Malignant neoplasm of bronchus of right upper lobe ([place]) 04/24/2017  . Abdominal aortic ectasia ([place]) 03/23/2017  . Mass of upper lobe of right lung 03/14/2017  . Aortic atherosclerosis ([place]) 03/14/2017  . Chronic obstructive pulmonary disease ([place]) 03/13/2017  . Grade II diastolic dysfunction 44/31/5400  . Hypertension goal BP (blood pressure) < 140/90 03/12/2017  . Chronic cough 03/12/2017  . Diabetes mellitus without complication ([place]) 86/76/1950  . Former cigarette smoker 03/12/2017  . NSTEMI (non-ST elevated myocardial infarction) ([place]) 10/20/2015  . Carotid arterial disease ([place])   . Hyperlipidemia   . Coronary artery disease   . Leukocytosis   . Hypertensive heart disease without heart failure     Palliative Care Assessment & Plan   Patient Profile: 73 year old male with stage IV NSCLC and complicated hospital course including GI bleed, PNA, and A flutter with RVR  Recommendations/Plan: - Mr. Zoll reports desire to pursue residential hospice for end of life care.  His family is in agreement with this.  He is interested in either Hospice Home in [facility] or [facility]. - Pain: Reports better with change to dilaudid.  He will continue to require titration of medications at residential  hospice facility.   Code Status:    Code Status Orders  (From admission, onward)          Start     Ordered   06/25/18 1857  Do not attempt resuscitation (DNR)  Continuous    Question Answer Comment  In the event of cardiac or respiratory ARREST Do not call a "code blue"   In the event of cardiac or respiratory ARREST Do not perform Intubation, CPR, defibrillation or ACLS   In the event of cardiac or respiratory ARREST Use medication by any route, position, wound care, and other measures to relive pain and suffering. May use oxygen, suction and manual treatment of airway obstruction as needed for comfort.      06/25/18 1856        Code Status History    Date Active Date Inactive Code Status Order ID Comments User Context   06/21/2018 1659 06/25/2018 1856 Partial Code [id number]  [name], MD ED   06/21/2018 1321 06/21/2018 1659 DNR [id number]  [name], MD ED   10/20/2015 2008 10/22/2015 1908 Full Code [id number]  Rogelia Mire, NP Inpatient    Advance Directive Documentation     Most Recent Value  Type of Advance Directive  Healthcare Power of Attorney, Living will  Pre-existing out of facility DNR order (yellow form or pink MOST form)  -  "MOST" Form in Place?  -       Prognosis:  Mr Kovalenko has advanced cancer with recent GI bleed with high risk of bleed recurrence.  He also has pneumonia with WBC still significantly elevated at 38.6 and has been going in and out of a-flutter with RVR.  With a focus on comfort, I believe that there is high likelihood that his prognosis is 2 weeks or less.  He has been having increased need for pain management and is likely to continue to develop symptoms.  I believe that he will be best served by transition to residential hospice if it can be arranged.       Discharge Planning: Hospice facility   Care plan was discussed with Dr. Tana Coast  Thank you for allowing the Palliative Medicine Team to assist in the care of this patient.   Total Time 60 Prolonged Time Billed No      Greater than 50%  of this time  was spent counseling and coordinating care related to the above assessment and plan.  Micheline Rough, MD  Please contact Palliative Medicine Team phone at [phone] for questions and concerns.

## 2018-07-01 NOTE — Progress Notes (Signed)
[facility]                                                                              Patient Demographics  Randy [name], is a 73 y.o. male, DOB - [birth year], HUD:[id number]  Admit date - 06/21/2018   Admitting Physician [name], MD  Outpatient Primary MD for the patient is [name], PA-C  Outpatient specialists:   LOS - 10  days   Medical records reviewed and are as summarized below:    Chief Complaint  Patient presents with  . 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  Patient on admission noted to have a hemoglobin of 7.9 from 11.9 on 06/10/2018.  Patient transfused a unit of packed red blood cells however had ongoing rectal bleeding the night of 06/21/2018 with 7 episodes of bloody bowel movements with hemoglobin dropping down to 7.2 despite a unit of packed red blood cell transfusion.  GI was consulted and felt that patient's anemia/bleed could likely be multifactorial secondary to internal hemorrhoid hemorrhage versus rectal stercoral ulcer.  Assessment & Plan    Principal Problem: GI bleed/hematochezia secondary to presumed diverticular bleed -History of constipation likely opioid induced on Movantik and MiraLAX as needed at home.  Prior to admission patient had significant constipation with concerns for impaction, hence there was concern for hemorrhoidal bleed versus stercoral ulcer -On the night of 06/21/2018, patient had ongoing GI bleed with 7 bloody BMs, on  admission hemoglobin 7.9, received total of 3 units packed RBCs during the hospitalization -Patient underwent flexible sigmoidoscopy on 06/22/2018 which showed diverticulosis -GI, [name] recommended continuation of Movantik, MiraLAX twice daily, rectal hydrocortisone suppositories twice daily. -Aspirin and Plavix on hold, received 1 packed RBC transfusion on 1/29 -Pain issues today, appreciate pain management by palliative medicine  Atrial flutter with RVR and 2:1 block, new -On 06/27/2018 evening, patient went into rapid atrial flutter, given Cardizem 10 mg IV x1, with mild improvement in heart rate briefly however became hypotensive, digoxin 0.25 mg IV x1 with no effect, then placed on amiodarone drip -Continue amiodarone 400 mg for 2 weeks then 200 mg daily, normal sinus rhythm -Secondary to GI bleed, stage IV malignancy, cardiology recommended can resume aspirin if there is convincing evidence of bleeding has stopped.  For the time being no anticoagulation or antiplatelet agents.  Symptomatic anemia/acute blood loss anemia -Received total of 4 units packed RBCs till now -H&H stable  Healthcare associated pneumonia -On 06/24/2018, patient was found to be hypotensive with systolic BPs in 50Y, worsening leukocytosis, high as 54.1 -Chest x-ray was concerning for pneumonia, initially placed on IV vancomycin and IV cefepime. -Urine strep antigen negative  Hypotension -Likely due to symptomatic anemia however concern for  sepsis due to worsening leukocytosis, pneumonia, hypotension.  BP currently stable  Hypothermia -Resolved likely due to sepsis, pneumonia -TSH 2.0  Diabetes mellitus type 2 Last hemoglobin A1c 6.7 on 03/29/2018.  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 - Patient being followed by Dr. [name], last seen on 06/10/2018, felt patient may not be a good candidate for any treatment with his current status and recommended working on nutrition. - Palliative medicine was consulted, seen by [name], goals of care addressed, DNR status  Dusky bluish coloration of the toes/feet -Concern for ischemic changes, poor perfusion due to significant GI bleed, Dopplers showed pulses on the right dorsalis pedis and tibialis, left tibialis -Patient had presented with GI bleed as such aspirin and Plavix currently on hold -ABIs done, patient seen by vascular surgery [name], recommended no role for vascular surgical intervention can follow-up on an as-needed basis.  Constipation Continue Movantik, continue MiraLAX   Leukocytosis Likely due to pneumonia on chest x-ray 1/27 - WBC count improving, MRSA PCR negative -Continue IV cefepime  Hyperlipidemia Continue statin  CAD with grade 2 diastolic dysfunction Currently no chest pain, continue to hold aspirin and Plavix - stable, placed on low-dose Lasix 20 mg daily  Severe protein calorie malnutrition Likely due to progressive stage IV lung CA Patient received IV albumin during this hospitalizatio  Stage I pressure injury to coccyx Wound care consulted  Goals of care -Discussed in detail with the patient's son, daughter, wife, sister-in-law at the bedside. Overall very poor prognosis with stage IV non-small cell lung cancer, performance/functional status poor, severe protein calorie malnutrition.  Family is now more realistic of current situation and open to the idea of hospice.  Social work consult placed for residential hospice  Code Status: *DNR DVT Prophylaxis: SCDs Family Communication: Discussed in detail with  the patient, all imaging results, lab results explained to the patient, son, daughter, wife, sister-in-law at the bedside   Disposition Plan: Residential hospice/beacon placement once bed available  Time Spent in minutes 50 minutes  Procedures:   Chest x-ray 06/21/2018, 06/22/2018, 06/24/2018  1 unit packed red blood cells 06/21/2018  2 units packed red blood cells 06/22/2018  Flexible sigmoidoscopy per [name] 06/22/2018  ABIs 06/23/2018  Consultants:    Gastroenterology: Dr. Silverio Decamp 06/21/2018  Palliative care: Dr.Anwar 06/22/2018 Vascular surgery: Dr. Donzetta Matters 06/23/2018* Cardiology, Dr. Radford Pax 1/31  Antimicrobials:   Anti-infectives (From admission, onward)   Start     Dose/Rate Route Frequency Ordered Stop   06/24/18 2359  vancomycin (VANCOCIN) IVPB 750 mg/150 ml premix  Status:  Discontinued     750 mg 150 mL/hr over 60 Minutes Intravenous Every 12 hours 06/24/18 1109 06/25/18 1002   06/24/18 2000  ceFEPIme (MAXIPIME) 1 g in sodium chloride 0.9 % 100 mL IVPB     1 g 200 mL/hr over 30 Minutes Intravenous Every 8 hours 06/24/18 1059     06/24/18 1200  vancomycin (VANCOCIN) 1,250 mg in sodium chloride 0.9 % 250 mL IVPB     1,250 mg 166.7 mL/hr over 90 Minutes Intravenous  Once 06/24/18 1102 06/24/18 1423   06/24/18 1130  ceFEPIme (MAXIPIME) 2 g in sodium chloride 0.9 %  100 mL IVPB     2 g 200 mL/hr over 30 Minutes Intravenous  Once 06/24/18 1059 06/24/18 1502         Medications  Scheduled Meds: . [START ON 07/13/2018] amiodarone  200 mg Oral Daily  . amiodarone  400 mg Oral Daily  . atorvastatin  40 mg Oral Daily  . chlorhexidine  15 mL Mouth Rinse BID  . Chlorhexidine Gluconate Cloth  6 each Topical Daily  . erythromycin  1 application Right Eye QHS  . feeding supplement (ENSURE ENLIVE)  237 mL Oral BID BM  . furosemide  20 mg Oral Daily  . gatifloxacin  1 drop Right Eye QID  . hydrocortisone  25 mg Rectal BID  . mouth rinse  15 mL Mouth Rinse q12n4p  .  naloxegol oxalate  25 mg Oral Daily  . pantoprazole  40 mg Oral Daily  . polyvinyl alcohol  1 drop Both Eyes QID  . predniSONE  5 mg Oral BID WC  . sodium chloride flush  10-40 mL Intracatheter Q12H   Continuous Infusions: . sodium chloride 10 mL/hr at 07/01/18 0600  . ceFEPime (MAXIPIME) IV 1 g (07/01/18 1203)   PRN Meds:.sodium chloride, acetaminophen **OR** acetaminophen, chlorpheniramine-HYDROcodone, HYDROmorphone (DILAUDID) injection, ondansetron **OR** ondansetron (ZOFRAN) IV, polyethylene glycol, sodium chloride flush, traZODone      Subjective:   Randy [name] was seen and examined today.  Patient somewhat somnolent but easily arousable, had received pain medication prior to my encounter.  Family members at the bedside.  He appears to be comfortable.  Normal sinus rhythm.  No fevers. Objective:   Vitals:   06/30/18 1500 06/30/18 1556 06/30/18 2107 07/01/18 0432  BP: 115/62 120/78 132/78 108/64  Pulse: 89 88 92 89  Resp: (!) 26 (!) 30 20 (!) 22  Temp:  (!) 97.4 F (36.3 C) 97.8 F (36.6 C) (!) 97.5 F (36.4 C)  TempSrc:  Axillary Oral Oral  SpO2: 99% 100% 100% 99%  Weight:      Height:        Intake/Output Summary (Last 24 hours) at 07/01/2018 1402 Last data filed at 07/01/2018 0600 Gross per 24 hour  Intake 460 ml  Output 975 ml  Net -515 ml     Wt Readings from Last 3 Encounters:  06/21/18 64.7 kg  06/10/18 64 kg  05/28/18 64.9 kg    Physical Exam  General: Somnolent but easily arousable, cachectic, ill-appearing  Eyes:   HEENT:    Cardiovascular: S1 S2 clear,  RRR.  2+ pedal edema b/l  Respiratory: Fairly CTA B anteriorly  Gastrointestinal: Soft, nontender, nondistended, NBS  Ext: no pedal edema bilaterally  Neuro: Did not assess  Musculoskeletal: No cyanosis, clubbing  Skin: No rashes  Psych: Somnolent    Data Reviewed:  I have personally reviewed following labs and imaging studies  Micro Results Recent Results (from the past 240  hour(s))  MRSA PCR Screening     Status: None   Collection Time: 06/21/18  9:14 PM  Result Value Ref Range Status   MRSA by PCR NEGATIVE NEGATIVE Final    Comment:        The GeneXpert MRSA Assay (FDA approved for NASAL specimens only), is one component of a comprehensive MRSA colonization surveillance program. It is not intended to diagnose MRSA infection nor to guide or monitor treatment for MRSA infections. Performed at [facility], [place] [address], [place], [place] [address]   Culture, blood (Routine X 2) w Reflex  to ID Panel     Status: None   Collection Time: 06/24/18 10:33 AM  Result Value Ref Range Status   Specimen Description LEFT ANTECUBITAL  Final   Special Requests   Final    BOTTLES DRAWN AEROBIC AND ANAEROBIC Blood Culture adequate volume Performed at [facility] [address], [place], [place] [address]    Culture NO GROWTH 5 DAYS  Final   Report Status 06/29/2018 FINAL  Final  Culture, blood (Routine X 2) w Reflex to ID Panel     Status: None   Collection Time: 06/24/18 11:07 AM  Result Value Ref Range Status   Specimen Description BLOOD RIGHT HAND  Final   Special Requests   Final    BOTTLES DRAWN AEROBIC ONLY Blood Culture adequate volume Performed at [facility] [address], [place], [place] [address]    Culture NO GROWTH 5 DAYS  Final   Report Status 06/29/2018 FINAL  Final  Culture, Urine     Status: None   Collection Time: 06/24/18  1:05 PM  Result Value Ref Range Status   Specimen Description   Final    URINE, RANDOM Performed at [facility] [address], [place], [place] [address]    Special Requests   Final    NONE Performed at [facility], [place] [address] [place], [place] [address]    Culture   Final    NO GROWTH Performed at [facility], [place] [address], [place], [place] [address]    Report Status 06/25/2018 FINAL  Final    Culture, sputum-assessment     Status: None   Collection Time: 06/25/18  9:41 PM  Result Value Ref Range Status   Specimen Description SPUTUM  Final   Special Requests NONE  Final   Sputum evaluation   Final    THIS SPECIMEN IS ACCEPTABLE FOR SPUTUM CULTURE Performed at [facility], [place] [address], [place], [place] [address]    Report Status 06/25/2018 FINAL  Final  Culture, respiratory     Status: None   Collection Time: 06/25/18  9:41 PM  Result Value Ref Range Status   Specimen Description   Final    SPUTUM Performed at [place] [address], [place], [place] [address]    Special Requests   Final    NONE Reflexed from [id number] Performed at [facility], [place] [address] [place], [place] [address]    Gram Stain   Final    ABUNDANT WBC PRESENT,BOTH PMN AND MONONUCLEAR FEW GRAM VARIABLE ROD FEW GRAM NEGATIVE RODS FEW GRAM POSITIVE COCCI ABUNDANT SQUAMOUS EPITHELIAL CELLS PRESENT Performed at [facility], [place] [address], [place], [place] [address]    Culture FEW STAPHYLOCOCCUS AUREUS  Final   Report Status 06/28/2018 FINAL  Final   Organism ID, Bacteria STAPHYLOCOCCUS AUREUS  Final      Susceptibility   Staphylococcus aureus - MIC*    CIPROFLOXACIN <=0.5 SENSITIVE Sensitive     ERYTHROMYCIN <=0.25 SENSITIVE Sensitive     GENTAMICIN <=0.5 SENSITIVE Sensitive     OXACILLIN 0.5 SENSITIVE Sensitive     TETRACYCLINE <=1 SENSITIVE Sensitive     VANCOMYCIN <=0.5 SENSITIVE Sensitive     TRIMETH/SULFA <=10 SENSITIVE Sensitive     CLINDAMYCIN <=0.25 SENSITIVE Sensitive     RIFAMPIN <=0.5 SENSITIVE Sensitive     Inducible Clindamycin NEGATIVE Sensitive     * FEW STAPHYLOCOCCUS AUREUS  Radiology Reports Dg Chest 2 View  Result Date: 06/21/2018 CLINICAL DATA:  History of lung cancer.  Weight loss. EXAM: CHEST - 2 VIEW COMPARISON:  March 12, 2017 FINDINGS: The heart size is normal. Right central venous line  is identified in the superior vena cava. There is right perihilar and hilar mass progressed compared to prior chest x-ray of 2018 consistent with patient's known lung cancer. The left lung is clear. Nipple shadow is identified in the left lung base. The visualized skeletal structures are stable. IMPRESSION: Right perihilar and hilar mass progressed compared to prior chest x-ray of 2018 consistent with patient's known lung cancer. No focal pneumonia is noted. Electronically Signed   By: Abelardo Diesel M.D.   On: 06/21/2018 10:27   Dg Chest Port 1 View  Result Date: 06/24/2018 CLINICAL DATA:  Leukocytosis. EXAM: PORTABLE CHEST 1 VIEW COMPARISON:  06/22/2018 FINDINGS: Right chest wall port a catheter is identified with tip in the right atrium. Extensive right lung post treatment changes with volume loss and asymmetric elevation of the right hemidiaphragm again noted. Similar appearance of perihilar masslike architectural distortion within the right lung. Asymmetric airspace opacity within the right lung base is new from the previous exam and may represent early pneumonia. The left lung appears clear. IMPRESSION: 1. New opacity within the right lung base compared with 06/22/2018 may represent a small early pneumonia. Electronically Signed   By: Kerby Moors M.D.   On: 06/24/2018 12:03   Dg Chest Port 1 View  Result Date: 06/22/2018 CLINICAL DATA:  Shortness of breath this morning. History of diabetes, lung cancer, hypertension and coronary artery disease. EXAM: PORTABLE CHEST 1 VIEW COMPARISON:  Chest x-ray dated 06/21/2018. FINDINGS: Heart size and mediastinal contours are stable. Stable RIGHT perihilar opacity, corresponding to patient's known history of lung cancer, better demonstrated on earlier chest CT of 10/19/2017. Stable volume loss on the RIGHT. LEFT lung remains clear. No pleural effusion or pneumothorax seen. RIGHT-sided Port-A-Cath is stable in position with tip overlying the lower SVC/cavoatrial  junction. IMPRESSION: No change compared to yesterday's chest x-ray. Known RIGHT perihilar cancer. No evidence of pneumonia or pulmonary edema. Electronically Signed   By: Franki Cabot M.D.   On: 06/22/2018 10:32   Vas [place] Burnard Bunting With/wo Tbi  Result Date: 06/25/2018 LOWER EXTREMITY DOPPLER STUDY Indications: Discolored toes.  Performing Technologist: Carlos Levering RVT  Examination Guidelines: A complete evaluation includes at minimum, Doppler waveform signals and systolic blood pressure reading at the level of bilateral brachial, anterior tibial, and posterior tibial arteries, when vessel segments are accessible. Bilateral testing is considered an integral part of a complete examination. Photoelectric Plethysmograph (PPG) waveforms and toe systolic pressure readings are included as required and additional duplex testing as needed. Limited examinations for reoccurring indications may be performed as noted.  ABI Findings: +--------+------------------+-----+---------+--------+ Right   Rt Pressure (mmHg)IndexWaveform Comment  +--------+------------------+-----+---------+--------+ [id number]                    triphasic         +--------+------------------+-----+---------+--------+ PTA     139               1.05 biphasic          +--------+------------------+-----+---------+--------+ DP      113               0.85 biphasic          +--------+------------------+-----+---------+--------+ +--------+------------------+-----+---------+-------+ Left    Lt Pressure (mmHg)IndexWaveform Comment +--------+------------------+-----+---------+-------+ [id number]  triphasic        +--------+------------------+-----+---------+-------+ PTA     145               1.09 biphasic         +--------+------------------+-----+---------+-------+ DP      134               1.01 biphasic         +--------+------------------+-----+---------+-------+  +-------+-----------+-----------+------------+------------+ ABI/TBIToday's ABIToday's TBIPrevious ABIPrevious TBI +-------+-----------+-----------+------------+------------+ Right  1.05                                           +-------+-----------+-----------+------------+------------+ Left   1.09                                           +-------+-----------+-----------+------------+------------+  Summary: Right: Resting right ankle-brachial index is within normal range. No evidence of significant right lower extremity arterial disease. Left: Resting left ankle-brachial index is within normal range. No evidence of significant left lower extremity arterial disease.  *See table(s) above for measurements and observations.  Electronically signed by Ruta Hinds MD on 06/25/2018 at 11:52:37 AM.   Final     Lab Data:  CBC: Recent Labs  Lab 06/24/18 1538 06/25/18 0400 06/26/18 0348 06/27/18 1237 06/28/18 9628 06/29/18 0342 06/30/18 0523 07/01/18 0620  WBC 54.1* 51.5* 44.1* 52.2* 47.5* 44.4* 41.6* 38.6*  NEUTROABS 47.6* 45.4* 39.4*  --   --   --   --   --   HGB 8.6* 8.3* 7.5* 9.8* 8.6* 8.4* 8.1* 8.1*  HCT 27.0* 26.3* 23.9* 31.0* 27.0* 26.6* 26.3* 26.9*  MCV 90.0 90.7 93.4 94.2 93.8 92.7 95.3 94.4  PLT 119* 117* 101* 115* 108* 134* 133* 366*   Basic Metabolic Panel: Recent Labs  Lab 06/27/18 1237 06/28/18 0734 06/29/18 0342 06/30/18 0523 07/01/18 0620  NA 130* 130* 131* 131* 132*  K 3.9 4.2 4.2 4.2 4.2  CL 98 99 98 98 98  CO2 25 24 25 25 28   GLUCOSE 144* 132* 131* 122* 130*  BUN 25* 29* 30* 32* 30*  CREATININE 0.55* 0.54* 0.54* 0.57* 0.54*  CALCIUM 10.1 9.3 9.8 10.0 9.8  MG  --  1.9  --   --   --    GFR: Estimated Creatinine Clearance: 76.4 mL/min (A) (by C-G formula based on SCr of 0.54 mg/dL (L)). Liver Function Tests: No results for input(s): AST, ALT, ALKPHOS, BILITOT, PROT, ALBUMIN in the last 168 hours. No results for input(s): LIPASE, AMYLASE in the  last 168 hours. No results for input(s): AMMONIA in the last 168 hours. Coagulation Profile: No results for input(s): INR, PROTIME in the last 168 hours. Cardiac Enzymes: No results for input(s): CKTOTAL, CKMB, CKMBINDEX, TROPONINI in the last 168 hours. BNP (last 3 results) No results for input(s): PROBNP in the last 8760 hours. HbA1C: No results for input(s): HGBA1C in the last 72 hours. CBG: Recent Labs  Lab 06/30/18 1237 06/30/18 1628 06/30/18 2114 07/01/18 0739 07/01/18 1112  GLUCAP 118* 97 112* 111* 133*   Lipid Profile: No results for input(s): CHOL, HDL, LDLCALC, TRIG, CHOLHDL, LDLDIRECT in the last 72 hours. Thyroid Function Tests: No results for input(s): TSH, T4TOTAL, FREET4, T3FREE, THYROIDAB in the last 72 hours. Anemia Panel: No results for input(s): VITAMINB12, FOLATE,  FERRITIN, TIBC, IRON, RETICCTPCT in the last 72 hours. Urine analysis:    Component Value Date/Time   COLORURINE YELLOW 06/24/2018 1305   APPEARANCEUR CLEAR 06/24/2018 1305   LABSPEC 1.023 06/24/2018 1305   PHURINE 5.0 06/24/2018 1305   GLUCOSEU NEGATIVE 06/24/2018 1305   HGBUR SMALL (A) 06/24/2018 1305   BILIRUBINUR NEGATIVE 06/24/2018 1305   KETONESUR NEGATIVE 06/24/2018 1305   PROTEINUR 30 (A) 06/24/2018 1305   NITRITE NEGATIVE 06/24/2018 1305   LEUKOCYTESUR NEGATIVE 06/24/2018 1305     Keylie Beavers M.D. [facility] 07/01/2018, 2:02 PM  Pager: [phone] Between 7am to 7pm - call Pager - [phone]  After 7pm go to [email or web address] - password [email or web address]  Call night coverage person covering after 7pm

## 2018-07-01 NOTE — Plan of Care (Signed)
  Problem: Elimination: Goal: Will not experience complications related to urinary retention Outcome: Progressing   Problem: Pain Managment: Goal: General experience of comfort will improve Outcome: Progressing   Problem: Safety: Goal: Ability to remain free from injury will improve Outcome: Progressing   

## 2018-07-01 NOTE — Progress Notes (Signed)
Daily Progress Note   Patient Name: Randy [name]       Date: 07/01/2018 DOB: [birth year]  Age: 73 y.o. MRN#: [id number] Attending Physician: Mendel Corning, MD Primary Care Physician: Kris Mouton Admit Date: 06/21/2018  Reason for Consultation/Follow-up: Establishing goals of care  Subjective: I met today with Randy [name], 2 of his children, and a family friend who is also a physician.  See below  Length of Stay: 10  Current Medications: Scheduled Meds:  . [START ON 07/13/2018] amiodarone  200 mg Oral Daily  . amiodarone  400 mg Oral Daily  . atorvastatin  40 mg Oral Daily  . chlorhexidine  15 mL Mouth Rinse BID  . Chlorhexidine Gluconate Cloth  6 each Topical Daily  . erythromycin  1 application Right Eye QHS  . feeding supplement (ENSURE ENLIVE)  237 mL Oral BID BM  . furosemide  20 mg Oral Daily  . gatifloxacin  1 drop Right Eye QID  . hydrocortisone  25 mg Rectal BID  . mouth rinse  15 mL Mouth Rinse q12n4p  . naloxegol oxalate  25 mg Oral Daily  . pantoprazole  40 mg Oral Daily  . polyvinyl alcohol  1 drop Both Eyes QID  . predniSONE  5 mg Oral BID WC  . sodium chloride flush  10-40 mL Intracatheter Q12H    Continuous Infusions: . sodium chloride 10 mL/hr at 07/01/18 0600  . ceFEPime (MAXIPIME) IV 1 g (07/01/18 0429)    PRN Meds: sodium chloride, acetaminophen **OR** acetaminophen, chlorpheniramine-HYDROcodone, HYDROmorphone (DILAUDID) injection, ondansetron **OR** ondansetron (ZOFRAN) IV, polyethylene glycol, sodium chloride flush, traZODone  Physical Exam         Awake alert No distress Oriented Non focal Extremities some what more warm to touch, still with dusky appearance S 1 S 2  Regular breath sounds  Vital Signs: BP 108/64 (BP  Location: Left Arm)   Pulse 89   Temp (!) 97.5 F (36.4 C) (Oral)   Resp (!) 22   Ht _0  (1.803 m)   Wt 64.7 kg   SpO2 99%   BMI 19.89 kg/m  SpO2: SpO2: 99 % O2 Device: O2 Device: Nasal Cannula O2 Flow Rate: O2 Flow Rate (L/min): 2 L/min  Intake/output summary:   Intake/Output Summary (Last 24 hours) at 07/01/2018 0852 Last  data filed at 07/01/2018 0600 Gross per 24 hour  Intake 460 ml  Output 975 ml  Net -515 ml   LBM: Last BM Date: 06/30/18 Baseline Weight: Weight: 64.7 kg Most recent weight: Weight: 64.7 kg       Palliative Assessment/Data:    Flowsheet Rows     Most Recent Value  Intake Tab  Referral Department  Hospitalist  Unit at Time of Referral  ER  Palliative Care Primary Diagnosis  Cancer  Date Notified  06/21/18  Palliative Care Type  New Palliative care  Reason for referral  Clarify Goals of Care  Date of Admission  06/21/18  Date first seen by Palliative Care  06/22/18  # of days Palliative referral response time  1 Day(s)  # of days IP prior to Palliative referral  0  Clinical Assessment  Psychosocial & Spiritual Assessment  Palliative Care Outcomes      Patient Active Problem List   Diagnosis Date Noted  . HCAP (healthcare-associated pneumonia) 06/25/2018  . Hypotension 06/24/2018  . Protein-calorie malnutrition, severe 06/23/2018  . Encounter for palliative care   . Pressure injury of skin 06/22/2018  . GI bleed 06/22/2018  . SOB (shortness of breath)   . Palliative care by specialist   . Goals of care, counseling/discussion   . Hematochezia 06/21/2018  . Hyponatremia 06/21/2018  . Therapeutic opioid induced constipation 06/03/2018  . Failure to thrive syndrome, adult 06/03/2018  . Dyspepsia 04/04/2018  . 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 Abnormal weight loss 04/04/2018  . Stage IV squamous cell carcinoma of right lung ([place]) 11/12/2017  . Palliative care status 11/12/2017  . BMI 28.0-28.9,adult 09/27/2017  .  Nodule of right external ear 09/26/2017  . Mild protein-calorie malnutrition ([place]) 09/26/2017  . Malignant neoplasm of bronchus of right upper lobe ([place]) 04/24/2017  . Abdominal aortic ectasia ([place]) 03/23/2017  . Mass of upper lobe of right lung 03/14/2017  . Aortic atherosclerosis ([place]) 03/14/2017  . Chronic obstructive pulmonary disease ([place]) 03/13/2017  . Grade II diastolic dysfunction 51/06/5850  . Hypertension goal BP (blood pressure) < 140/90 03/12/2017  . Chronic cough 03/12/2017  . Diabetes mellitus without complication ([place]) 77/82/4235  . Former cigarette smoker 03/12/2017  . NSTEMI (non-ST elevated myocardial infarction) ([place]) 10/20/2015  . Carotid arterial disease ([place])   . Hyperlipidemia   . Coronary artery disease   . Leukocytosis   . Hypertensive heart disease without heart failure     Palliative Care Assessment & Plan   Patient Profile: 73 year old male with stage IV NSCLC and complicated hospital course including GI bleed, PNA, and A flutter with RVR  Recommendations/Plan: - I was heading down hall to see Randy [name] when two of his children and a family friend, who is also a Engineer, drilling and former Environmental consultant in [place], asked me to discuss with them.  They report understanding that Randy [name] is approaching end of life, and they think that he would likely best be served by hospice moving forward.  We discussed options for hospice at their request, including home hospice vs residential hospice services.  There are 2 teenagers in the home with Randy [name] and they remain concerned about how they will deal with it if he dies at home.  I do think that with a focus on comfort, he would likely be appropriate for residential hospice. - We then met with Randy [name] to discuss plans moving forward.  I expressed my concern that he has irreversible  illness and has continued to grow weaker rather than stronger.  He was somewhat open to conversation, but continued to  report being hopeful that he will be able to gain weight and be able to received more disease modifying therapy.  I gently let him know that I do not think that this is likely a realistic possibility at this point.  He seemed accepting of this, but later said he hopes to get strong enough to have more treatment. - He reports pain has been bigger concern today and that his current medication is not working.  He has not told anyone else this as to not be a "bother."  Pain has been 7-9/10 with pain getting to a 9 and improving to a 7 after pain medication.  His currently ordered pain medication also makes him nauseous.  Will transition to dilaudid tonight to see if more effective and then convert back to oral once better idea of his overall needs. - Will follow-up tomorrow.  He seems to be more open to discussions regarding end of life today.  Code Status:    Code Status Orders  (From admission, onward)         Start     Ordered   06/25/18 1857  Do not attempt resuscitation (DNR)  Continuous    Question Answer Comment  In the event of cardiac or respiratory ARREST Do not call a "code blue"   In the event of cardiac or respiratory ARREST Do not perform Intubation, CPR, defibrillation or ACLS   In the event of cardiac or respiratory ARREST Use medication by any route, position, wound care, and other measures to relive pain and suffering. May use oxygen, suction and manual treatment of airway obstruction as needed for comfort.      06/25/18 1856        Code Status History    Date Active Date Inactive Code Status Order ID Comments User Context   06/21/2018 1659 06/25/2018 1856 Partial Code [id number]  [name], MD ED   06/21/2018 1321 06/21/2018 1659 DNR [id number]  [name], MD ED   10/20/2015 2008 10/22/2015 1908 Full Code [id number]  Rogelia Mire, NP Inpatient    Advance Directive Documentation     Most Recent Value  Type of Advance Directive  Healthcare Power of  Attorney, Living will  Pre-existing out of facility DNR order (yellow form or pink MOST form)  -  "MOST" Form in Place?  -       Prognosis:   Weeks  Discharge Planning:  To Be Determined- two of his children report that they think he will be best served by residential hospice  Care plan was discussed with Dr. Tana Coast  Thank you for allowing the Palliative Medicine Team to assist in the care of this patient.   Total Time 60 Prolonged Time Billed No      Greater than 50%  of this time was spent counseling and coordinating care related to the above assessment and plan.  Micheline Rough, MD  Please contact Palliative Medicine Team phone at [phone] for questions and concerns.

## 2018-07-01 NOTE — Care Management Important Message (Signed)
Important Message  Patient Details  Name: Randy [name] MRN: [id number] Date of Birth: 1945-08-30   Medicare Important Message Given:  Yes    Kerin Salen 07/01/2018, 12:04 [place] Message  Patient Details  Name: Randy [name] MRN: [id number] Date of Birth: [birth year]   Medicare Important Message Given:  Yes    Kerin Salen 07/01/2018, 12:04 PM

## 2018-07-01 NOTE — Progress Notes (Signed)
Progress Note  Patient Name: Randy [name] Date of Encounter: 07/01/2018  Primary Cardiologist: [name], MD   Subjective   No cardiac complaints   Inpatient Medications    Scheduled Meds: . [START ON 07/13/2018] amiodarone  200 mg Oral Daily  . amiodarone  400 mg Oral Daily  . atorvastatin  40 mg Oral Daily  . chlorhexidine  15 mL Mouth Rinse BID  . Chlorhexidine Gluconate Cloth  6 each Topical Daily  . erythromycin  1 application Right Eye QHS  . feeding supplement (ENSURE ENLIVE)  237 mL Oral BID BM  . furosemide  20 mg Oral Daily  . gatifloxacin  1 drop Right Eye QID  . hydrocortisone  25 mg Rectal BID  . mouth rinse  15 mL Mouth Rinse q12n4p  . naloxegol oxalate  25 mg Oral Daily  . pantoprazole  40 mg Oral Daily  . polyvinyl alcohol  1 drop Both Eyes QID  . predniSONE  5 mg Oral BID WC  . sodium chloride flush  10-40 mL Intracatheter Q12H   Continuous Infusions: . sodium chloride 10 mL/hr at 07/01/18 0600  . ceFEPime (MAXIPIME) IV 1 g (07/01/18 0429)   PRN Meds: sodium chloride, acetaminophen **OR** acetaminophen, chlorpheniramine-HYDROcodone, HYDROmorphone (DILAUDID) injection, ondansetron **OR** ondansetron (ZOFRAN) IV, polyethylene glycol, sodium chloride flush, traZODone   Vital Signs    Vitals:   06/30/18 1500 06/30/18 1556 06/30/18 2107 07/01/18 0432  BP: 115/62 120/78 132/78 108/64  Pulse: 89 88 92 89  Resp: (!) 26 (!) 30 20 (!) 22  Temp:  (!) 97.4 F (36.3 C) 97.8 F (36.6 C) (!) 97.5 F (36.4 C)  TempSrc:  Axillary Oral Oral  SpO2: 99% 100% 100% 99%  Weight:      Height:        Intake/Output Summary (Last 24 hours) at 07/01/2018 1001 Last data filed at 07/01/2018 0600 Gross per 24 hour  Intake 460 ml  Output 975 ml  Net -515 ml   Last 3 Weights 06/21/2018 06/10/2018 05/28/2018  Weight (lbs) 142 lb 10.2 oz 141 lb 3.2 oz 143 lb  Weight (kg) 64.7 kg 64.048 kg 64.864 kg      Telemetry    Sinus rhythm- Personally Reviewed  ECG    No  new tracing - Personally Reviewed  Physical Exam  Cachectic, drowsy GEN: No acute distress.   Neck: No JVD Cardiac: RRR, no murmurs, rubs, or gallops.  Respiratory: Clear to auscultation bilaterally. GI: Soft, nontender, non-distended  MS: No edema; No deformity. Neuro:  Nonfocal  Psych: Normal affect   Labs    Chemistry Recent Labs  Lab 06/29/18 0342 06/30/18 0523 07/01/18 0620  NA 131* 131* 132*  K 4.2 4.2 4.2  CL 98 98 98  CO2 25 25 28   GLUCOSE 131* 122* 130*  BUN 30* 32* 30*  CREATININE 0.54* 0.57* 0.54*  CALCIUM 9.8 10.0 9.8  GFRNONAA >60 >60 >60  GFRAA >60 >60 >60  ANIONGAP 8 8 6      Hematology Recent Labs  Lab 06/29/18 0342 06/30/18 0523 07/01/18 0620  WBC 44.4* 41.6* 38.6*  RBC 2.87* 2.76* 2.85*  HGB 8.4* 8.1* 8.1*  HCT 26.6* 26.3* 26.9*  MCV 92.7 95.3 94.4  MCH 29.3 29.3 28.4  MCHC 31.6 30.8 30.1  RDW 18.8* 19.1* 18.6*  PLT 134* 133* 138*    Cardiac EnzymesNo results for input(s): TROPONINI in the last 168 hours. No results for input(s): TROPIPOC in the last 168 hours.   BNP  Recent Labs  Lab 06/27/18 1258  BNP 312.9*     DDimer No results for input(s): DDIMER in the last 168 hours.   Radiology    No results found.  Cardiac Studies   06/29/2018 echo  1. Noemal left ventricular size and systolic function. Mild concentric left ventricular hypertrophy.  EF 55-60%.  2. There is probable mild hypokinesis of the basal inferior and inferolateral left ventricular segments.  3. Mildly dilated right ventricular with normal systolic function.  4. Mildly dilated left atrial size.  5. Mildly dilated right atrial size.  6. There is moderate mitral annular calcification present. No significant mitral stenosis or insufficiency.  7. Normal tricuspid valve.  8. The aortic valve is tricuspid. There is mild thickening and sclerosis without any evidence of stenosis of the aortic valve.  9. The interatrial septum was not assessed.  Patient Profile       73 y.o. male with CAD s/p NSTEMI and stent to left circumflex/angioplasty to OM1 (2017), history of right carotid endarterectomy, borderline LVEF 88%, chronic diastolic heart failure, diabetes mellitus, hyperlipidemia, hypertension who has stage IV non-small cell lung cancer and was admitted for anemia related to colonic diverticular bleeding.  Developed healthcare associated pneumonia and subsequently atrial flutter with 2:1 AV block during the hospitalization, return to normal sinus rhythm on intravenous amiodarone.  Assessment & Plan    1. AFlutter:  Continue amiodarone p.o. 400 mg daily for 2 weeks, then 200 mg daily.  No anticoagulation given poor prognosis and recent GI bleed  NSR this am  2. HLD:    On statin. 3. CHF:  Evidence of diastolic dysfunction on previous echo.  He does not appear volume overloaded either clinically or by echo. 4. HTN:  Well controlled.  Continue current medications and low sodium Dash type diet.   5. DM Discussed low carb diet.  Target hemoglobin A1c is 6.5 or less.  Continue current medications. 6. Stage IV non-small cell lung cancer:  Prognosis is poor.  He has very poor functional status and has developed cachexia.  Palliative care should be strongly considered.     [facility] will sign off.   Medication Recommendations: Amiodarone 400 mg daily, then 200 mg daily starting February 15.  For the time being he should not receive anticoagulant or antiplatelet agents.  Can resume aspirin if there is convincing evidence that bleeding has stopped. Other recommendations (labs, testing, etc): As long as he is on amiodarone should have liver and thyroid function tests at least every 6 months. Follow up as an outpatient: When he is ready for discharge, please arrange follow-up in clinic in 1 month, unless the decision is made to pursue comfort care.  For questions or updates, please contact [place] Please consult [email or web address] for contact info under         Signed, Jenkins Rouge, MD  07/01/2018, 10:01 AM  \

## 2018-07-01 NOTE — Progress Notes (Signed)
PT Cancellation Note  Patient Details Name: Randy [name] MRN: [id number] DOB: 11/26/1945   Cancelled Treatment:    Reason Eval/Treat Not Completed: Per chart, plan is for comfort care and residential hospice. Will sign off.    Weston Anna, PT Acute Rehabilitation Services Pager: [phone] Office: [phone]

## 2018-07-01 NOTE — Progress Notes (Signed)
Patient's family requesting Port needle not to be changed today due to possible discharge tomorrow. Patient also palliative care.

## 2018-07-01 NOTE — Progress Notes (Signed)
Clinical Social Worker received consult to assist patient in being placed into a Residential Hospice facility. Per consult patient wants to go to [facility] or [facility]. CSW reached out to Harmon Pier to make referral for [facility]. Harmon Pier stated she will look over patients clinicals and let CSW know if they are able to offer hospice bed. CSW will continue to follow for support.   Rhea Pink, MSW,  [place]

## 2018-07-01 NOTE — Progress Notes (Signed)
OT Cancellation Note  Patient Details Name: KYNDAL GLOSTER MRN: [id number] DOB: 1946-02-06   Cancelled Treatment:    Reason Eval/Treat Not Completed: Other (comment)  Per chart, plan is for comfort care and residential hospice. Will sign off  Lorain, Mickel Baas, OT Acute Rehabilitation Services [phone] [phone]    07/01/2018, 4:55 PM

## 2018-07-01 NOTE — Consult Note (Signed)
   [facility] Inpatient Consult   07/01/2018  Randy [name] 1946-01-20 [id number]     Patient screened for potential [facility] Care Management services due to unplanned readmission risk score of 23% (high).  Chart reviewed. Palliative Medicine Team following. Noted discharge plan is for residential hospice. No identifiable [facility] Care Management needs.    Marthenia Rolling, MSN-Ed, RN,BSN [facility] Liaison [phone]

## 2018-07-01 NOTE — Progress Notes (Signed)
[facility]  Received request from [place] [facility] for family interest in [facility]. Chart reviewed and eligibility has been confirmed. Plan to meet with spouse Tuesday morning to complete paper work for transfer Tuesday. Will update CSW will paper work complete.   Thank you,  Erling Conte, LCSW [phone]

## 2018-07-02 DIAGNOSIS — K2921 Alcoholic gastritis with bleeding: Secondary | ICD-10-CM

## 2018-07-02 DIAGNOSIS — R451 Restlessness and agitation: Secondary | ICD-10-CM

## 2018-07-02 DIAGNOSIS — Z515 Encounter for palliative care: Secondary | ICD-10-CM

## 2018-07-02 LAB — GLUCOSE, CAPILLARY: Glucose-Capillary: 98 mg/dL (ref 70–99)

## 2018-07-02 MED ORDER — LORAZEPAM 2 MG/ML IJ SOLN
0.5000 mg | INTRAMUSCULAR | Status: DC | PRN
  Administered 2018-07-02: 0.5 mg via INTRAVENOUS
  Filled 2018-07-02: qty 1

## 2018-07-02 MED ORDER — LORAZEPAM 2 MG/ML IJ SOLN: 0.5000 mg | INTRAMUSCULAR | 0 refills | Status: AC | PRN

## 2018-07-02 MED ORDER — FUROSEMIDE 20 MG PO TABS: 20.0000 mg | ORAL_TABLET | Freq: Every day | ORAL | Status: AC

## 2018-07-02 MED ORDER — HEPARIN SOD (PORK) LOCK FLUSH 100 UNIT/ML IV SOLN
500.0000 [IU] | INTRAVENOUS | Status: AC | PRN
  Administered 2018-07-02: 500 [IU]

## 2018-07-02 MED ORDER — AMIODARONE HCL 200 MG PO TABS: 200.0000 mg | ORAL_TABLET | Freq: Every day | ORAL | Status: AC

## 2018-07-02 MED ORDER — HYDROCORTISONE ACETATE 25 MG RE SUPP: 25.0000 mg | Freq: Two times a day (BID) | RECTAL | 0 refills | Status: AC

## 2018-07-02 MED ORDER — PANTOPRAZOLE SODIUM 40 MG PO TBEC: 40.0000 mg | DELAYED_RELEASE_TABLET | Freq: Every day | ORAL | Status: AC

## 2018-07-02 MED ORDER — POLYETHYLENE GLYCOL 3350 17 G PO PACK: 17.0000 g | PACK | Freq: Every day | ORAL | 0 refills | Status: AC | PRN

## 2018-07-02 MED ORDER — AMIODARONE HCL 400 MG PO TABS: 400.0000 mg | ORAL_TABLET | Freq: Every day | ORAL | Status: AC

## 2018-07-02 NOTE — Progress Notes (Signed)
Daily Progress Note   Patient Name: Randy [name]       Date: 07/02/2018 DOB: 11-18-1945  Age: 73 y.o. MRN#: [id number] Attending Physician: Mendel Corning, MD Primary Care Physician: Kris Mouton Admit Date: 06/21/2018  Reason for Consultation/Follow-up: Establishing goals of care  Subjective: Patient is asleep, wife at bedside.   Wife Randy [name] states that the patient was restless all night, he was some what agitated, additionally, he was complaining of pain in his legs, also has scrotal swelling/discomfort. Wife asking about foley catheter.     See below  Length of Stay: 11  Current Medications: Scheduled Meds:  . [START ON 07/13/2018] amiodarone  200 mg Oral Daily  . amiodarone  400 mg Oral Daily  . atorvastatin  40 mg Oral Daily  . chlorhexidine  15 mL Mouth Rinse BID  . Chlorhexidine Gluconate Cloth  6 each Topical Daily  . erythromycin  1 application Right Eye QHS  . feeding supplement (ENSURE ENLIVE)  237 mL Oral BID BM  . furosemide  20 mg Oral Daily  . gatifloxacin  1 drop Right Eye QID  . hydrocortisone  25 mg Rectal BID  . mouth rinse  15 mL Mouth Rinse q12n4p  . naloxegol oxalate  25 mg Oral Daily  . pantoprazole  40 mg Oral Daily  . polyvinyl alcohol  1 drop Both Eyes QID  . predniSONE  5 mg Oral BID WC  . sodium chloride flush  10-40 mL Intracatheter Q12H    Continuous Infusions: . sodium chloride 10 mL/hr at 07/01/18 0600    PRN Meds: sodium chloride, acetaminophen **OR** acetaminophen, chlorpheniramine-HYDROcodone, HYDROmorphone (DILAUDID) injection, LORazepam, ondansetron **OR** ondansetron (ZOFRAN) IV, polyethylene glycol, sodium chloride flush, traZODone  Physical Exam         Asleep at the moment, appears in no distress More weak  appearing, pale appearing  No distress Shallow regular respirations Abdomen is not distended Has some edema in both LE  still with dusky appearance    Vital Signs: BP (!) 142/75 (BP Location: Left Arm)   Pulse 90   Temp 98.3 F (36.8 C) (Oral)   Resp 20   Ht 5\' 11"  (1.803 m)   Wt 64.7 kg   SpO2 100%   BMI 19.89 kg/m  SpO2: SpO2: 100 % O2 Device: O2  Device: Nasal Cannula O2 Flow Rate: O2 Flow Rate (L/min): 1.5 L/min  Intake/output summary:   Intake/Output Summary (Last 24 hours) at 07/02/2018 1962 Last data filed at 07/02/2018 0600 Gross per 24 hour  Intake 220 ml  Output 550 ml  Net -330 ml   LBM: Last BM Date: 06/30/18 Baseline Weight: Weight: 64.7 kg Most recent weight: Weight: 64.7 kg   PPS 20%    Palliative Assessment/Data:    Flowsheet Rows     Most Recent Value  Intake Tab  Referral Department  Hospitalist  Unit at Time of Referral  ER  Palliative Care Primary Diagnosis  Cancer  Date Notified  06/21/18  Palliative Care Type  New Palliative care  Reason for referral  Clarify Goals of Care  Date of Admission  06/21/18  Date first seen by Palliative Care  06/22/18  # of days Palliative referral response time  1 Day(s)  # of days IP prior to Palliative referral  0  Clinical Assessment  Psychosocial & Spiritual Assessment  Palliative Care Outcomes      Patient Active Problem List   Diagnosis Date Noted  . HCAP (healthcare-associated pneumonia) 06/25/2018  . Hypotension 06/24/2018  . Protein-calorie malnutrition, severe 06/23/2018  . Encounter for palliative care   . Pressure injury of skin 06/22/2018  . GI bleed 06/22/2018  . SOB (shortness of breath)   . Palliative care by specialist   . Goals of care, counseling/discussion   . Hematochezia 06/21/2018  . Hyponatremia 06/21/2018  . Therapeutic opioid induced constipation 06/03/2018  . Failure to thrive syndrome, adult 06/03/2018  . Dyspepsia 04/04/2018  . 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 Abnormal weight loss 04/04/2018  . Stage IV squamous cell carcinoma of right lung ([place]) 11/12/2017  . Palliative care status 11/12/2017  . BMI 28.0-28.9,adult 09/27/2017  . Nodule of right external ear 09/26/2017  . Mild protein-calorie malnutrition ([place]) 09/26/2017  . Malignant neoplasm of bronchus of right upper lobe ([place]) 04/24/2017  . Abdominal aortic ectasia ([place]) 03/23/2017  . Mass of upper lobe of right lung 03/14/2017  . Aortic atherosclerosis ([place]) 03/14/2017  . Chronic obstructive pulmonary disease ([place]) 03/13/2017  . Grade II diastolic dysfunction 22/97/9892  . Hypertension goal BP (blood pressure) < 140/90 03/12/2017  . Chronic cough 03/12/2017  . Diabetes mellitus without complication ([place]) 11/94/1740  . Former cigarette smoker 03/12/2017  . NSTEMI (non-ST elevated myocardial infarction) ([place]) 10/20/2015  . Carotid arterial disease ([place])   . Hyperlipidemia   . Coronary artery disease   . Leukocytosis   . Hypertensive heart disease without heart failure     Palliative Care Assessment & Plan   Patient Profile: 73 year old male with stage IV NSCLC and complicated hospital course including GI bleed, PNA, and A flutter with RVR  Recommendations/Plan: - Mr. Carlyon and his family have decided to pursue residential hospice for end of life care.  His wife states that she will tour both [facility] [place] as well as [facility] today. - Pain: continue IV dilaudid.  He will continue to require titration of medications at residential hospice facility.  Agitation/restlessness: Will add Ativan IV PRN and monitor.  End of life signs and symptoms discussed with wife. Offered active listening and supportive care.  We discussed about fluid shifts and other changes at this point in time. Recommend residential hospice on discharge. Patient's wife is tearful at his ongoing decline.   Code Status:    Code Status Orders  (  From admission, onward)         Start      Ordered   06/25/18 1857  Do not attempt resuscitation (DNR)  Continuous    Question Answer Comment  In the event of cardiac or respiratory ARREST Do not call a "code blue"   In the event of cardiac or respiratory ARREST Do not perform Intubation, CPR, defibrillation or ACLS   In the event of cardiac or respiratory ARREST Use medication by any route, position, wound care, and other measures to relive pain and suffering. May use oxygen, suction and manual treatment of airway obstruction as needed for comfort.      06/25/18 1856        Code Status History    Date Active Date Inactive Code Status Order ID Comments User Context   06/21/2018 1659 06/25/2018 1856 Partial Code [id number]  [name], MD ED   06/21/2018 1321 06/21/2018 1659 DNR [id number]  [name], MD ED   10/20/2015 2008 10/22/2015 1908 Full Code [id number]  Rogelia Mire, NP Inpatient    Advance Directive Documentation     Most Recent Value  Type of Advance Directive  Healthcare Power of Attorney, Living will  Pre-existing out of facility DNR order (yellow form or pink MOST form)  -  "MOST" Form in Place?  -       Prognosis:  Mr Bergdoll has advanced cancer with recent GI bleed with high risk of bleed recurrence.  He also has pneumonia with WBC still significantly elevated at 38.6 and has been going in and out of a-flutter with RVR.  With a focus on comfort, I believe that there is high likelihood that his prognosis is 2 weeks or less.  He has been having increased need for pain management and is likely to continue to develop symptoms.  I believe that he will be best served by transition to residential hospice if it can be arranged.       Discharge Planning: Hospice facility   Care plan was discussed with patient's wife at the bedside.   Thank you for allowing the Palliative Medicine Team to assist in the care of this patient.   Total Time 35 Prolonged Time Billed No      Greater than 50%  of  this time was spent counseling and coordinating care related to the above assessment and plan.  [name], MD [phone] Please contact Palliative Medicine Team phone at [phone] for questions and concerns.

## 2018-07-02 NOTE — Progress Notes (Signed)
Clinical Social Worker following patient and family for support and discharge needs. CSW spoke with patients wife ([name]) via phone. [name] stated her and family have not made a decision on wether they want patient to go to [facility] or [facility]. CSW will continue to follow for discharge needs.   Rhea Pink, MSW,  [place]

## 2018-07-02 NOTE — Progress Notes (Signed)
Clinical Social Worker facilitated patient discharge including contacting patient family and facility to confirm patient discharge plans.  Clinical information faxed to facility and family agreeable with plan.  CSW arranged ambulance transport via [facility] to [facility] [place] .  RN to call [phone] for report prior to discharge.  Clinical Social Worker will sign off for now as social work intervention is no longer needed. Please consult [place] again if new need arises.  Rhea Pink, MSW, [place]

## 2018-07-02 NOTE — Progress Notes (Signed)
[facility]                                                                              Patient Demographics  Randy [name], is a 73 y.o. male, DOB - [birth year], ZOX:[id number]  Admit date - 06/21/2018   Admitting Physician [name], MD  Outpatient Primary MD for the patient is [name], PA-C  Outpatient specialists:   LOS - 11  days   Medical records reviewed and are as summarized below:    Chief Complaint  Patient presents with  . 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  Patient on admission noted to have a hemoglobin of 7.9 from 11.9 on 06/10/2018.  Patient transfused a unit of packed red blood cells however had ongoing rectal bleeding the night of 06/21/2018 with 7 episodes of bloody bowel movements with hemoglobin dropping down to 7.2 despite a unit of packed red blood cell transfusion.  GI was consulted and felt that patient's anemia/bleed could likely be multifactorial secondary to internal hemorrhoid hemorrhage versus rectal stercoral ulcer.  Assessment & Plan    Principal Problem: GI bleed/hematochezia secondary to presumed diverticular bleed -History of constipation likely opioid induced on Movantik and MiraLAX as needed at home.  Prior to admission patient had significant constipation with concerns for impaction, hence there was concern for hemorrhoidal bleed versus stercoral ulcer -On the night of 06/21/2018, patient had ongoing GI bleed with 7 bloody BMs, on  admission hemoglobin 7.9, received total of 3 units packed RBCs during the hospitalization -Patient underwent flexible sigmoidoscopy on 06/22/2018 which showed diverticulosis -GI, [name] recommended continuation of Movantik, MiraLAX twice daily, rectal hydrocortisone suppositories twice daily. Aspirin and Plavix on hold, received 1 packed RBC transfusion on 1/29  Atrial flutter with RVR and 2:1 block, new -On 06/27/2018 evening, patient went into rapid atrial flutter, no significant improvement with Cardizem or digoxin, patient was placed on amiodarone drip and transferred to stepdown unit.  -Continue amiodarone 400 mg for 2 weeks then 200 mg daily, normal sinus rhythm -Secondary to GI bleed, stage IV malignancy, not an anticoagulation candidate.  -Now on comfort care status  Symptomatic anemia/acute blood loss anemia -Received total of 4 units packed RBCs, H&H stable -H&H stable  Healthcare associated pneumonia -On 06/24/2018, patient was found to be hypotensive with systolic BPs in 14N, worsening leukocytosis, high as 54.1 -Chest x-ray was concerning for pneumonia, initially placed on IV vancomycin and IV cefepime. -Urine strep antigen negative.  Patient has completed 8 days of IV cefepime, DC antibiotics -Now on comfort measures  Hypotension -Likely due to symptomatic anemia however concern for sepsis due to worsening leukocytosis, pneumonia, hypotension.   -BP currently stable  Hypothermia -Resolved likely due to  sepsis, pneumonia -TSH 2.0  Diabetes mellitus type 2 Last hemoglobin A1c 6.7 on 03/29/2018.  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 - Patient being followed by Dr. [name], last seen on 06/10/2018, felt patient may not be a good candidate for any treatment with his current status -Palliative following closely, addressing pain management  Dusky bluish coloration of the toes/feet -Concern for ischemic changes, poor perfusion due to significant GI bleed, Dopplers showed pulses on the right dorsalis pedis and tibialis, left tibialis -Patient had presented with GI bleed as such aspirin and Plavix currently on hold -ABIs done, patient seen by vascular surgery [name], recommended no role for vascular surgical intervention can follow-up on an as-needed basis.  Constipation Continue Movantik, continue MiraLAX   Leukocytosis Likely due to pneumonia on chest x-ray 1/27 WBC count improving, patient has completed 8 days of IV cefepime, discontinued Now on comfort measures  Hyperlipidemia Statins discontinued, on comfort measures  CAD with grade 2 diastolic dysfunction Currently no chest pain, continue to hold aspirin and Plavix, on low-dose Lasix 20 mg daily  Severe protein calorie malnutrition Likely due to progressive stage IV lung CA Patient received IV albumin during this hospitalization  Stage I pressure injury to coccyx Wound care consulted  Goals of care -I have discussed in detail with the patient's son, daughter, wife, sister-in-law at the bedside.   - Overall very poor prognosis with stage IV non-small cell lung cancer, performance/functional status poor, severe protein calorie malnutrition.  -Family and patient now realistic of deteriorating status, comfort measures initiated -Palliative medicine assisting with pain management, social work consulted for residential hospice.  Code Status: *DNR DVT Prophylaxis: SCDs Family Communication: Discussed in detail with the patient, all imaging results, lab results explained to the  patient's wife at the bedside, tearful, wants to look at both [facility] today.   Disposition Plan: Residential hospice once bed available  Time Spent in minutes 67mins   Procedures:   Chest x-ray 06/21/2018, 06/22/2018, 06/24/2018  1 unit packed red blood cells 06/21/2018  2 units packed red blood cells 06/22/2018  Flexible sigmoidoscopy per [name] 06/22/2018  ABIs 06/23/2018  Consultants:    Gastroenterology: Dr. Silverio Decamp 06/21/2018  Palliative care: Dr.Anwar 06/22/2018 Vascular surgery: Dr. Donzetta Matters 06/23/2018* Cardiology, Dr. Radford Pax 1/31  Antimicrobials:   Anti-infectives (From admission, onward)   Start     Dose/Rate Route Frequency Ordered Stop   06/24/18 2359  vancomycin (VANCOCIN) IVPB 750 mg/150 ml premix  Status:  Discontinued     750 mg 150 mL/hr over 60 Minutes Intravenous Every 12 hours 06/24/18 1109 06/25/18 1002   06/24/18 2000  ceFEPIme (MAXIPIME) 1 g in sodium chloride 0.9 % 100 mL IVPB  Status:  Discontinued     1 g 200 mL/hr over 30 Minutes Intravenous Every 8 hours 06/24/18 1059 07/02/18 0855   06/24/18 1200  vancomycin (VANCOCIN) 1,250 mg in sodium chloride 0.9 % 250 mL IVPB     1,250 mg 166.7 mL/hr over 90 Minutes Intravenous  Once 06/24/18 1102 06/24/18 1423   06/24/18 1130  ceFEPIme (MAXIPIME) 2 g in sodium chloride 0.9 % 100 mL IVPB  2 g 200 mL/hr over 30 Minutes Intravenous  Once 06/24/18 1059 06/24/18 1502         Medications  Scheduled Meds: . [START ON 07/13/2018] amiodarone  200 mg Oral Daily  . amiodarone  400 mg Oral Daily  . atorvastatin  40 mg Oral Daily  . chlorhexidine  15 mL Mouth Rinse BID  . Chlorhexidine Gluconate Cloth  6 each Topical Daily  . erythromycin  1 application Right Eye QHS  . feeding supplement (ENSURE ENLIVE)  237 mL Oral BID BM  . furosemide  20 mg Oral Daily  . gatifloxacin  1 drop Right Eye QID  . hydrocortisone  25 mg Rectal BID  . mouth rinse  15 mL Mouth Rinse q12n4p  . naloxegol oxalate   25 mg Oral Daily  . pantoprazole  40 mg Oral Daily  . polyvinyl alcohol  1 drop Both Eyes QID  . predniSONE  5 mg Oral BID WC  . sodium chloride flush  10-40 mL Intracatheter Q12H   Continuous Infusions: . sodium chloride 10 mL/hr at 07/01/18 0600   PRN Meds:.sodium chloride, acetaminophen **OR** acetaminophen, chlorpheniramine-HYDROcodone, HYDROmorphone (DILAUDID) injection, LORazepam, ondansetron **OR** ondansetron (ZOFRAN) IV, polyethylene glycol, sodium chloride flush, traZODone      Subjective:   Jamerion Cabello was seen and examined today.  Alert and awake at the time of my encounter, states pain is controlled.  Per patient's wife at the bedside, he was restless whole night, has scrotal swelling with discomfort.   Objective:   Vitals:   07/01/18 0432 07/01/18 1407 07/01/18 2006 07/02/18 0532  BP: 108/64 113/72 113/80 (!) 142/75  Pulse: 89 98 100 90  Resp: (!) 22 18 (!) 25 20  Temp: (!) 97.5 F (36.4 C) (!) 97.5 F (36.4 C) (!) 97.5 F (36.4 C) 98.3 F (36.8 C)  TempSrc: Oral Oral Oral Oral  SpO2: 99%  100% 100%  Weight:      Height:        Intake/Output Summary (Last 24 hours) at 07/02/2018 1254 Last data filed at 07/02/2018 3825 Gross per 24 hour  Intake 220 ml  Output 775 ml  Net -555 ml     Wt Readings from Last 3 Encounters:  06/21/18 64.7 kg  06/10/18 64 kg  05/28/18 64.9 kg    Physical Exam  General: Alert and oriented,, weak and cachectic  Eyes:   HEENT:    Cardiovascular: S1 S2 clear, no murmurs, RRR. 2+ pedal edema b/l  Respiratory: Decreased breath sound at the bases  Gastrointestinal: Soft, nontender, nondistended, NBS  Ext: 2+ pedal edema with scrotal edema  Neuro: no new deficits  Musculoskeletal: No cyanosis, clubbing  Skin:   Psych: Alert and awake    Data Reviewed:  I have personally reviewed following labs and imaging studies  Micro Results Recent Results (from the past 240 hour(s))  Culture, blood (Routine X 2) w  Reflex to ID Panel     Status: None   Collection Time: 06/24/18 10:33 AM  Result Value Ref Range Status   Specimen Description LEFT ANTECUBITAL  Final   Special Requests   Final    BOTTLES DRAWN AEROBIC AND ANAEROBIC Blood Culture adequate volume Performed at [facility] [address] [place], [place] [address]    Culture NO GROWTH 5 DAYS  Final   Report Status 06/29/2018 FINAL  Final  Culture, blood (Routine X 2) w Reflex to ID Panel     Status: None   Collection Time:  06/24/18 11:07 AM  Result Value Ref Range Status   Specimen Description BLOOD RIGHT HAND  Final   Special Requests   Final    BOTTLES DRAWN AEROBIC ONLY Blood Culture adequate volume Performed at [facility] [address], [place], [place] [address]    Culture NO GROWTH 5 DAYS  Final   Report Status 06/29/2018 FINAL  Final  Culture, Urine     Status: None   Collection Time: 06/24/18  1:05 PM  Result Value Ref Range Status   Specimen Description   Final    URINE, RANDOM Performed at [facility] [address], [place], [place] [address]    Special Requests   Final    NONE Performed at [facility], [place] [address] [place], [place] [address]    Culture   Final    NO GROWTH Performed at [facility], [place] [address] [place], [place] [address]    Report Status 06/25/2018 FINAL  Final  Culture, sputum-assessment     Status: None   Collection Time: 06/25/18  9:41 PM  Result Value Ref Range Status   Specimen Description SPUTUM  Final   Special Requests NONE  Final   Sputum evaluation   Final    THIS SPECIMEN IS ACCEPTABLE FOR SPUTUM CULTURE Performed at [facility], [place] [address] [place], [place] [address]    Report Status 06/25/2018 FINAL  Final  Culture, respiratory     Status: None   Collection Time: 06/25/18  9:41 PM  Result Value Ref Range Status   Specimen Description   Final     SPUTUM Performed at [facility] [address] [place], [place] [address]    Special Requests   Final    NONE Reflexed from [id number] Performed at [facility], [place] [address] [place],  [address]    Gram Stain   Final    ABUNDANT WBC PRESENT,BOTH PMN AND MONONUCLEAR FEW GRAM VARIABLE ROD FEW GRAM NEGATIVE RODS FEW GRAM POSITIVE COCCI ABUNDANT SQUAMOUS EPITHELIAL CELLS PRESENT Performed at [facility], [place] [address] [place],  [address]    Culture FEW STAPHYLOCOCCUS AUREUS  Final   Report Status 06/28/2018 FINAL  Final   Organism ID, Bacteria STAPHYLOCOCCUS AUREUS  Final      Susceptibility   Staphylococcus aureus - MIC*    CIPROFLOXACIN <=0.5 SENSITIVE Sensitive     ERYTHROMYCIN <=0.25 SENSITIVE Sensitive     GENTAMICIN <=0.5 SENSITIVE Sensitive     OXACILLIN 0.5 SENSITIVE Sensitive     TETRACYCLINE <=1 SENSITIVE Sensitive     VANCOMYCIN <=0.5 SENSITIVE Sensitive     TRIMETH/SULFA <=10 SENSITIVE Sensitive     CLINDAMYCIN <=0.25 SENSITIVE Sensitive     RIFAMPIN <=0.5 SENSITIVE Sensitive     Inducible Clindamycin NEGATIVE Sensitive     * FEW STAPHYLOCOCCUS AUREUS    Radiology Reports Dg Chest 2 View  Result Date: 06/21/2018 CLINICAL DATA:  History of lung cancer.  Weight loss. EXAM: CHEST - 2 VIEW COMPARISON:  March 12, 2017 FINDINGS: The heart size is normal. Right central venous line is identified in the superior vena cava. There is right perihilar and hilar mass progressed compared to prior chest x-ray of 2018 consistent with patient's known lung cancer. The left lung is clear. Nipple shadow is identified in the left lung base. The visualized skeletal structures are stable. IMPRESSION: Right perihilar and hilar mass progressed compared to prior chest  x-ray of 2018 consistent with patient's known lung cancer. No focal pneumonia is noted. Electronically Signed   By: Abelardo Diesel M.D.   On: 06/21/2018 10:27   Dg Chest  Port 1 View  Result Date: 06/24/2018 CLINICAL DATA:  Leukocytosis. EXAM: PORTABLE CHEST 1 VIEW COMPARISON:  06/22/2018 FINDINGS: Right chest wall port a catheter is identified with tip in the right atrium. Extensive right lung post treatment changes with volume loss and asymmetric elevation of the right hemidiaphragm again noted. Similar appearance of perihilar masslike architectural distortion within the right lung. Asymmetric airspace opacity within the right lung base is new from the previous exam and may represent early pneumonia. The left lung appears clear. IMPRESSION: 1. New opacity within the right lung base compared with 06/22/2018 may represent a small early pneumonia. Electronically Signed   By: Kerby Moors M.D.   On: 06/24/2018 12:03   Dg Chest Port 1 View  Result Date: 06/22/2018 CLINICAL DATA:  Shortness of breath this morning. History of diabetes, lung cancer, hypertension and coronary artery disease. EXAM: PORTABLE CHEST 1 VIEW COMPARISON:  Chest x-ray dated 06/21/2018. FINDINGS: Heart size and mediastinal contours are stable. Stable RIGHT perihilar opacity, corresponding to patient's known history of lung cancer, better demonstrated on earlier chest CT of 10/19/2017. Stable volume loss on the RIGHT. LEFT lung remains clear. No pleural effusion or pneumothorax seen. RIGHT-sided Port-A-Cath is stable in position with tip overlying the lower SVC/cavoatrial junction. IMPRESSION: No change compared to yesterday's chest x-ray. Known RIGHT perihilar cancer. No evidence of pneumonia or pulmonary edema. Electronically Signed   By: Franki Cabot M.D.   On: 06/22/2018 10:32   Vas [place] Burnard Bunting With/wo Tbi  Result Date: 06/25/2018 LOWER EXTREMITY DOPPLER STUDY Indications: Discolored toes.  Performing Technologist: Carlos Levering RVT  Examination Guidelines: A complete evaluation includes at minimum, Doppler waveform signals and systolic blood pressure reading at the level of bilateral brachial, anterior  tibial, and posterior tibial arteries, when vessel segments are accessible. Bilateral testing is considered an integral part of a complete examination. Photoelectric Plethysmograph (PPG) waveforms and toe systolic pressure readings are included as required and additional duplex testing as needed. Limited examinations for reoccurring indications may be performed as noted.  ABI Findings: +--------+------------------+-----+---------+--------+ Right   Rt Pressure (mmHg)IndexWaveform Comment  +--------+------------------+-----+---------+--------+ [id number]                    triphasic         +--------+------------------+-----+---------+--------+ PTA     139               1.05 biphasic          +--------+------------------+-----+---------+--------+ DP      113               0.85 biphasic          +--------+------------------+-----+---------+--------+ +--------+------------------+-----+---------+-------+ Left    Lt Pressure (mmHg)IndexWaveform Comment +--------+------------------+-----+---------+-------+ [id number]                    triphasic        +--------+------------------+-----+---------+-------+ PTA     145               1.09 biphasic         +--------+------------------+-----+---------+-------+ DP      134               1.01 biphasic         +--------+------------------+-----+---------+-------+ +-------+-----------+-----------+------------+------------+ ABI/TBIToday's ABIToday's TBIPrevious ABIPrevious TBI +-------+-----------+-----------+------------+------------+ Right  1.05                                           +-------+-----------+-----------+------------+------------+ Left   1.09                                           +-------+-----------+-----------+------------+------------+  Summary: Right: Resting right ankle-brachial index is within normal range. No evidence of significant right lower extremity arterial disease. Left: Resting  left ankle-brachial index is within normal range. No evidence of significant left lower extremity arterial disease.  *See table(s) above for measurements and observations.  Electronically signed by Ruta Hinds MD on 06/25/2018 at 11:52:37 AM.   Final     Lab Data:  CBC: Recent Labs  Lab 06/26/18 0348 06/27/18 1237 06/28/18 9485 06/29/18 0342 06/30/18 0523 07/01/18 0620  WBC 44.1* 52.2* 47.5* 44.4* 41.6* 38.6*  NEUTROABS 39.4*  --   --   --   --   --   HGB 7.5* 9.8* 8.6* 8.4* 8.1* 8.1*  HCT 23.9* 31.0* 27.0* 26.6* 26.3* 26.9*  MCV 93.4 94.2 93.8 92.7 95.3 94.4  PLT 101* 115* 108* 134* 133* 462*   Basic Metabolic Panel: Recent Labs  Lab 06/27/18 1237 06/28/18 0734 06/29/18 0342 06/30/18 0523 07/01/18 0620  NA 130* 130* 131* 131* 132*  K 3.9 4.2 4.2 4.2 4.2  CL 98 99 98 98 98  CO2 25 24 25 25 28   GLUCOSE 144* 132* 131* 122* 130*  BUN 25* 29* 30* 32* 30*  CREATININE 0.55* 0.54* 0.54* 0.57* 0.54*  CALCIUM 10.1 9.3 9.8 10.0 9.8  MG  --  1.9  --   --   --    GFR: Estimated Creatinine Clearance: 76.4 mL/min (A) (by C-G formula based on SCr of 0.54 mg/dL (L)). Liver Function Tests: No results for input(s): AST, ALT, ALKPHOS, BILITOT, PROT, ALBUMIN in the last 168 hours. No results for input(s): LIPASE, AMYLASE in the last 168 hours. No results for input(s): AMMONIA in the last 168 hours. Coagulation Profile: No results for input(s): INR, PROTIME in the last 168 hours. Cardiac Enzymes: No results for input(s): CKTOTAL, CKMB, CKMBINDEX, TROPONINI in the last 168 hours. BNP (last 3 results) No results for input(s): PROBNP in the last 8760 hours. HbA1C: No results for input(s): HGBA1C in the last 72 hours. CBG: Recent Labs  Lab 07/01/18 0739 07/01/18 1112 07/01/18 1647 07/01/18 2041 07/02/18 0739  GLUCAP 111* 133* 138* 128* 98   Lipid Profile: No results for input(s): CHOL, HDL, LDLCALC, TRIG, CHOLHDL, LDLDIRECT in the last 72 hours. Thyroid Function Tests: No  results for input(s): TSH, T4TOTAL, FREET4, T3FREE, THYROIDAB in the last 72 hours. Anemia Panel: No results for input(s): VITAMINB12, FOLATE, FERRITIN, TIBC, IRON, RETICCTPCT in the last 72 hours. Urine analysis:    Component Value Date/Time   COLORURINE YELLOW 06/24/2018 1305   APPEARANCEUR CLEAR 06/24/2018 1305   LABSPEC 1.023 06/24/2018 1305   PHURINE 5.0 06/24/2018 1305   GLUCOSEU NEGATIVE 06/24/2018 1305   HGBUR SMALL (A) 06/24/2018 1305   BILIRUBINUR NEGATIVE 06/24/2018 1305   KETONESUR NEGATIVE 06/24/2018 1305   PROTEINUR 30 (A) 06/24/2018 1305   NITRITE NEGATIVE 06/24/2018 1305   LEUKOCYTESUR NEGATIVE 06/24/2018 1305     Ripudeep Rai M.D. [facility] 07/02/2018, 12:54 PM  Pager: [phone] Between 7am to 7pm - call Pager - [phone]  After 7pm go to [email or web address] - password [email or web address]  Call night coverage person covering after 7pm

## 2018-07-02 NOTE — Progress Notes (Signed)
[facility]: [employer] Met with pt and his wife. There is also a son present as well. Discussed hospice services and the pt's goals of care. The pt and family confirm interest in the [facility] in [place]. Our MD Dr. Ander Purpura has approved the pt for GIP care. Pt will transfer by ambulance.   SW Caryl Pina arranging ambulance for 330pm. Webb Silversmith RN [phone]

## 2018-07-02 NOTE — Progress Notes (Signed)
Nutrition Brief Note  Patient last seen by a RD on 1/30. No meal intakes since 1/31. Chart reviewed. Pt now transitioning to comfort care with plan for d/c to residential hospice ([facility]) when a bed is available .  No further nutrition interventions warranted at this time.  Please re-consult as needed.     Jarome Matin, MS, RD, LDN, [facility] Inpatient Clinical Dietitian Pager # [phone] After hours/weekend pager # [phone]

## 2018-07-02 NOTE — Clinical Social Work Placement (Signed)
   CLINICAL SOCIAL WORK PLACEMENT  NOTE  Date:  07/02/2018  Patient Details  Name: Randy [name] MRN: [id number] Date of Birth: 1946/04/30  Clinical Social Work is seeking post-discharge placement for this patient at the   level of care (*CSW will initial, date and re-position this form in  chart as items are completed):      Patient/family provided with [facility] Work Department's list of facilities offering this level of care within the geographic area requested by the patient (or if unable, by the patient's family).      Patient/family informed of their freedom to choose among providers that offer the needed level of care, that participate in Medicare, Medicaid or managed care program needed by the patient, have an available bed and are willing to accept the patient.      Patient/family informed of [facility]'s ownership interest in [facility] and [facility], as well as of the fact that they are under no obligation to receive care at these facilities.  PASRR submitted to EDS on       PASRR number received on       Existing PASRR number confirmed on       FL2 transmitted to all facilities in geographic area requested by pt/family on       FL2 transmitted to all facilities within larger geographic area on       Patient informed that his/her managed care company has contracts with or will negotiate with certain facilities, including the following:            Patient/family informed of bed offers received.  [facility] at [facility])     Physician recommends and patient chooses bed at      Patient to be transferred to ([facility]) on 07/02/18.  Patient to be transferred to facility by ptar     Patient family notified on 07/02/18 of transfer.  Name of family member notified:  spoke with wife     PHYSICIAN       Additional Comment:    _______________________________________________ Wende Neighbors,  LCSW 07/02/2018, 2:44 PM

## 2018-07-02 NOTE — Discharge Summary (Signed)
Physician Discharge Summary   Patient ID: Randy [name] MRN: [id number] DOB/AGE: December 30, 1945 73 y.o.  Admit date: 06/21/2018 Discharge date: 07/02/2018  Primary Care Physician:  [name], PA-C   Recommendations for Outpatient Follow-up:  Comfort care  Home Health: Patient is discharging to residential hospice Equipment/Devices: None  Discharge Condition: Terminal, poor prognosis CODE STATUS: DNR, comfort care Diet recommendation: Comfort diet/regular   Discharge Diagnoses:    . GI bleed, hematochezia presumed diverticular Atrial flutter with RVR Symptomatic anemia/acute blood loss anemia Healthcare associated pneumonia Hypotension Hypothermia . Chronic obstructive pulmonary disease ([place]) . Coronary artery disease . Grade II diastolic dysfunction . Hyperlipidemia . Stage IV squamous cell carcinoma of right lung ([place]) . Hyponatremia . Stage I pressure injury to coccyx . Severe protein-calorie malnutrition ([place]) . Therapeutic opioid induced constipation   Consults:  Gastroenterology Palliative medicine Vascular surgery Cardiology    Allergies:   Allergies  Allergen Reactions  . Brilinta [Ticagrelor] Shortness Of Breath     DISCHARGE MEDICATIONS: Allergies as of 07/02/2018      Reactions   Brilinta [ticagrelor] Shortness Of Breath      Medication List    STOP taking these medications   atorvastatin 40 MG tablet Commonly known as:  LIPITOR   metoprolol succinate 25 MG 24 hr tablet Commonly known as:  TOPROL-XL   mirtazapine 30 MG tablet Commonly known as:  REMERON   PROBIOTIC PO   TUSSIONEX PENNKINETIC ER 10-8 MG/5ML Suer Generic drug:  chlorpheniramine-HYDROcodone     TAKE these medications   acetaminophen 500 MG tablet Commonly known as:  TYLENOL Take 1,000 mg by mouth every 6 (six) hours as needed for moderate pain.   AMBULATORY NON FORMULARY MEDICATION Power Wheelchair and accessories Diagnoses Stage IV squamous cell  carcinoma of right lung - C34.91, Debility R53.81   amiodarone 400 MG tablet Commonly known as:  PACERONE Take 1 tablet (400 mg total) by mouth daily for 10 days. Start taking on:  July 03, 2018   amiodarone 200 MG tablet Commonly known as:  PACERONE Take 1 tablet (200 mg total) by mouth daily. Start 07/13/2018 Start taking on:  July 13, 2018   aspirin EC 81 MG tablet Take 81 mg by mouth daily.   erythromycin ophthalmic ointment Place 1 application into the right eye at bedtime.   furosemide 20 MG tablet Commonly known as:  LASIX Take 1 tablet (20 mg total) by mouth daily. Start taking on:  July 03, 2018   HYDROcodone-acetaminophen 5-325 MG tablet Commonly known as:  NORCO/VICODIN Take 1 tablet by mouth every 6 (six) hours as needed for moderate pain.   hydrocortisone 25 MG suppository Commonly known as:  ANUSOL-HC Place 1 suppository (25 mg total) rectally 2 (two) times daily.   LORazepam 2 MG/ML injection Commonly known as:  ATIVAN Inject 0.25 mLs (0.5 mg total) into the vein every 4 (four) hours as needed for anxiety.   moxifloxacin 0.5 % ophthalmic solution Commonly known as:  VIGAMOX Place 1 drop into the right eye 4 (four) times daily.   naloxegol oxalate 25 MG Tabs tablet Commonly known as:  MOVANTIK Take 1 tablet (25 mg total) by mouth daily after breakfast. Take 1h before or 2h after meal   pantoprazole 40 MG tablet Commonly known as:  PROTONIX Take 1 tablet (40 mg total) by mouth daily. Start taking on:  July 03, 2018   polyethylene glycol packet Commonly known as:  MIRALAX / GLYCOLAX Take 17 g by mouth daily as  needed for moderate constipation.   predniSONE 10 MG tablet Commonly known as:  DELTASONE Take 0.5 tablets (5 mg total) by mouth 2 (two) times daily with a meal.   THERATEARS 0.25 % Soln Generic drug:  Carboxymethylcellulose Sodium Place 1 drop into both eyes 4 (four) times daily.   traZODone 50 MG tablet Commonly known as:   DESYREL Take 50 mg by mouth at bedtime as needed for sleep.        Brief H and P: For complete details please refer to admission H and P, but in brief *Patient is a 73 year old gentleman, medical history of abdominal aortic ectasia, coronary artery disease on Plavix, history of non-STEMI May 3474, grade 2 diastolic dysfunction, LVH, type 2 diabetes, hyperlipidemia, hypertension, history of leukocytosis, chronic constipation on Movantik and as needed MiraLAX, history of progressive stage IV non-small cell lung cancer currently not on treatment presented to the emergency room with rectal bleeding, weakness and shortness of breath. Patient on admission noted to have a hemoglobin of 7.9 from 11.9 on 06/10/2018. Patient transfused a unit of packed red blood cells however had ongoing rectal bleeding the night of 06/21/2018 with 7 episodes of bloody bowel movements with hemoglobin dropping down to 7.2 despite a unit of packed red blood cell transfusion. GI was consulted and felt that patient's anemia/bleed could likely be multifactorial secondary to internal hemorrhoid hemorrhage versus rectal stercoral ulcer.  Hospital Course:  GI bleed/hematochezia secondary to presumed diverticular bleed -History of constipation likely opioid induced on Movantik and MiraLAX as needed at home.  Prior to admission patient had significant constipation with concerns for impaction, hence there was concern for hemorrhoidal bleed versus stercoral ulcer -On the night of 06/21/2018, patient had ongoing GI bleed with 7 bloody BMs, on admission hemoglobin 7.9, received total of 3 units packed RBCs during the hospitalization -Patient underwent flexible sigmoidoscopy on 06/22/2018 which showed diverticulosis -GI, [name] recommended continuation of Movantik, MiraLAX twice daily, rectal hydrocortisone suppositories twice daily. Aspirin and Plavix placed on hold, received 1 packed RBC transfusion on 1/29  Atrial flutter with RVR and  2:1 block, new -On 06/27/2018 evening, patient went into rapid atrial flutter, no significant improvement with Cardizem or digoxin, patient was placed on amiodarone drip and transferred to stepdown unit.  -Continue amiodarone 400 mg for 2 weeks then 200 mg daily, normal sinus rhythm -Secondary to GI bleed, stage IV malignancy, not an anticoagulation candidate.  -Now on comfort care status  Symptomatic anemia/acute blood loss anemia -Received total of 4 units packed RBCs, H&H stable -H&H stable  Healthcare associated pneumonia -On 06/24/2018, patient was found to be hypotensive with systolic BPs in 25Z, worsening leukocytosis, high as 54.1 -Chest x-ray was concerning for pneumonia, initially placed on IV vancomycin and IV cefepime. -Urine strep antigen negative.  Patient has completed 8 days of IV cefepime, DC antibiotics -Now on comfort measures  Hypotension -Likely due to symptomatic anemia however concern for sepsis due to worsening leukocytosis, pneumonia, hypotension.   -BP currently stable  Hypothermia -Resolved likely due to sepsis, pneumonia -TSH 2.0  Diabetes mellitus type 2 Last hemoglobin A1c 6.7 on 03/29/2018.  CBGs controlled -Continue regular diet  Hyponatremia Secondary to hypovolemic hyponatremia, presented with GI bleed, hypotension, tachycardia -Sodium 125 at the time of admission, stable at 132  Stage IV squamous cell carcinoma of the right lung -Status post concurrent chemoradiation therapy, consolidation immunotherapy status post clinical trial with Tisotumab at [facility] in June 2019, subsequently discontinued due to disease  progression. - Patient being followed by Dr. [name], last seen on 06/10/2018, felt patient may not be a good candidate for any treatment with his current status -Palliative followed closely, addressing pain management  Dusky bluish coloration of the toes/feet -Concern for ischemic changes, poor perfusion due to  significant GI bleed, Dopplers showed pulses on the right dorsalis pedis and tibialis, left tibialis -Patient had presented with GI bleed as such aspirin and Plavix currently on hold -ABIs done, patient seen by vascular surgery [name], recommended no role for vascular surgical intervention can follow-up on an as-needed basis.  Constipation Continue Movantik, continue MiraLAX   Leukocytosis Likely due to pneumonia on chest x-ray 1/27 WBC count improving, patient has completed 8 days of IV cefepime, discontinued Now on comfort measures  Hyperlipidemia Statins discontinued, on comfort measures  CAD with grade 2 diastolic dysfunction Currently no chest pain, continue to hold aspirin and Plavix, on low-dose Lasix 20 mg daily  Severe protein calorie malnutrition Likely due to progressive stage IV lung CA Patient received IV albumin during this hospitalization  Stage I pressure injury to coccyx Wound care consulted  Goals of care -I have discussed in detail with the patient's son, daughter, wife, sister-in-law at the bedside. Overall very poor prognosis with stage IV non-small cell lung cancer, performance/functional status poor, severe protein calorie malnutrition.  -Family and patient now realistic of deteriorating status, comfort measures has been initiated  Code Status:   Day of Discharge S: No acute complaints  BP (!) 142/75 (BP Location: Left Arm)   Pulse 90   Temp 98.3 F (36.8 C) (Oral)   Resp 20   Ht 5\' 11"  (1.803 m)   Wt 64.7 kg   SpO2 100%   BMI 19.89 kg/m   Physical Exam: General: Alert and awake oriented, cachectic, frail HEENT: anicteric sclera, pupils reactive to light and accommodation CVS: S1-S2 clear no murmur rubs or gallops Chest: clear to auscultation bilaterally, no wheezing rales or rhonchi Abdomen: soft nontender, nondistended, normal bowel sounds Extremities: 2+ pitting edema, scrotal edema Neuro: No new deficits   The results of  significant diagnostics from this hospitalization (including imaging, microbiology, ancillary and laboratory) are listed below for reference.      Procedures/Studies:  Dg Chest 2 View  Result Date: 06/21/2018 CLINICAL DATA:  History of lung cancer.  Weight loss. EXAM: CHEST - 2 VIEW COMPARISON:  March 12, 2017 FINDINGS: The heart size is normal. Right central venous line is identified in the superior vena cava. There is right perihilar and hilar mass progressed compared to prior chest x-ray of 2018 consistent with patient's known lung cancer. The left lung is clear. Nipple shadow is identified in the left lung base. The visualized skeletal structures are stable. IMPRESSION: Right perihilar and hilar mass progressed compared to prior chest x-ray of 2018 consistent with patient's known lung cancer. No focal pneumonia is noted. Electronically Signed   By: Abelardo Diesel M.D.   On: 06/21/2018 10:27   Dg Chest Port 1 View  Result Date: 06/24/2018 CLINICAL DATA:  Leukocytosis. EXAM: PORTABLE CHEST 1 VIEW COMPARISON:  06/22/2018 FINDINGS: Right chest wall port a catheter is identified with tip in the right atrium. Extensive right lung post treatment changes with volume loss and asymmetric elevation of the right hemidiaphragm again noted. Similar appearance of perihilar masslike architectural distortion within the right lung. Asymmetric airspace opacity within the right lung base is new from the previous exam and may represent early pneumonia. The left lung appears  clear. IMPRESSION: 1. New opacity within the right lung base compared with 06/22/2018 may represent a small early pneumonia. Electronically Signed   By: Kerby Moors M.D.   On: 06/24/2018 12:03   Dg Chest Port 1 View  Result Date: 06/22/2018 CLINICAL DATA:  Shortness of breath this morning. History of diabetes, lung cancer, hypertension and coronary artery disease. EXAM: PORTABLE CHEST 1 VIEW COMPARISON:  Chest x-ray dated 06/21/2018. FINDINGS:  Heart size and mediastinal contours are stable. Stable RIGHT perihilar opacity, corresponding to patient's known history of lung cancer, better demonstrated on earlier chest CT of 10/19/2017. Stable volume loss on the RIGHT. LEFT lung remains clear. No pleural effusion or pneumothorax seen. RIGHT-sided Port-A-Cath is stable in position with tip overlying the lower SVC/cavoatrial junction. IMPRESSION: No change compared to yesterday's chest x-ray. Known RIGHT perihilar cancer. No evidence of pneumonia or pulmonary edema. Electronically Signed   By: Franki Cabot M.D.   On: 06/22/2018 10:32   Vas [place] Burnard Bunting With/wo Tbi  Result Date: 06/25/2018 LOWER EXTREMITY DOPPLER STUDY Indications: Discolored toes.  Performing Technologist: Carlos Levering RVT  Examination Guidelines: A complete evaluation includes at minimum, Doppler waveform signals and systolic blood pressure reading at the level of bilateral brachial, anterior tibial, and posterior tibial arteries, when vessel segments are accessible. Bilateral testing is considered an integral part of a complete examination. Photoelectric Plethysmograph (PPG) waveforms and toe systolic pressure readings are included as required and additional duplex testing as needed. Limited examinations for reoccurring indications may be performed as noted.  ABI Findings: +--------+------------------+-----+---------+--------+ Right   Rt Pressure (mmHg)IndexWaveform Comment  +--------+------------------+-----+---------+--------+ [id number]                    triphasic         +--------+------------------+-----+---------+--------+ PTA     139               1.05 biphasic          +--------+------------------+-----+---------+--------+ DP      113               0.85 biphasic          +--------+------------------+-----+---------+--------+ +--------+------------------+-----+---------+-------+ Left    Lt Pressure (mmHg)IndexWaveform Comment  +--------+------------------+-----+---------+-------+ [id number]                    triphasic        +--------+------------------+-----+---------+-------+ PTA     145               1.09 biphasic         +--------+------------------+-----+---------+-------+ DP      134               1.01 biphasic         +--------+------------------+-----+---------+-------+ +-------+-----------+-----------+------------+------------+ ABI/TBIToday's ABIToday's TBIPrevious ABIPrevious TBI +-------+-----------+-----------+------------+------------+ Right  1.05                                           +-------+-----------+-----------+------------+------------+ Left   1.09                                           +-------+-----------+-----------+------------+------------+  Summary: Right: Resting right ankle-brachial index is within normal range. No evidence of significant right lower extremity arterial disease. Left: Resting left ankle-brachial index is within  normal range. No evidence of significant left lower extremity arterial disease.  *See table(s) above for measurements and observations.  Electronically signed by Ruta Hinds MD on 06/25/2018 at 11:52:37 AM.   Final        LAB RESULTS: Basic Metabolic Panel: Recent Labs  Lab 06/28/18 0734  06/30/18 0523 07/01/18 0620  NA 130*   < > 131* 132*  K 4.2   < > 4.2 4.2  CL 99   < > 98 98  CO2 24   < > 25 28  GLUCOSE 132*   < > 122* 130*  BUN 29*   < > 32* 30*  CREATININE 0.54*   < > 0.57* 0.54*  CALCIUM 9.3   < > 10.0 9.8  MG 1.9  --   --   --    < > = values in this interval not displayed.   Liver Function Tests: No results for input(s): AST, ALT, ALKPHOS, BILITOT, PROT, ALBUMIN in the last 168 hours. No results for input(s): LIPASE, AMYLASE in the last 168 hours. No results for input(s): AMMONIA in the last 168 hours. CBC: Recent Labs  Lab 06/26/18 0348  06/30/18 0523 07/01/18 0620  WBC 44.1*   < > 41.6* 38.6*   NEUTROABS 39.4*  --   --   --   HGB 7.5*   < > 8.1* 8.1*  HCT 23.9*   < > 26.3* 26.9*  MCV 93.4   < > 95.3 94.4  PLT 101*   < > 133* 138*   < > = values in this interval not displayed.   Cardiac Enzymes: No results for input(s): CKTOTAL, CKMB, CKMBINDEX, TROPONINI in the last 168 hours. BNP: Invalid input(s): POCBNP CBG: Recent Labs  Lab 07/01/18 2041 07/02/18 0739  GLUCAP 128* 98      Disposition and Follow-up:     DISPOSITION: Residential hospice   DISCHARGE FOLLOW-UP    Time coordinating discharge:  35 minutes  Signed:   Estill Cotta M.D. [facility] 07/02/2018, 2:32 PM Pager: [phone]

## 2018-07-12 ENCOUNTER — Ambulatory Visit (HOSPITAL_COMMUNITY): Payer: Medicare Other

## 2018-07-12 ENCOUNTER — Other Ambulatory Visit: Payer: Medicare Other

## 2018-07-15 ENCOUNTER — Ambulatory Visit: Payer: Medicare Other | Admitting: Internal Medicine

## 2018-07-15 ENCOUNTER — Other Ambulatory Visit: Payer: Self-pay | Admitting: Internal Medicine

## 2018-07-28 DEATH — deceased

## 2019-09-11 IMAGING — CT NM PET TUM IMG INITIAL (PI) SKULL BASE T - THIGH
8 series · 25 of 25 positions shown · non-contrast
Comparison: CT 03/13/2017

CLINICAL DATA: Initial treatment strategy for staging of lung
cancer.

EXAM:
NUCLEAR MEDICINE PET SKULL BASE TO THIGH
TECHNIQUE: 3.2 mCi F-18 FDG was injected intravenously. Full-ring PET imaging
was performed from the skull base to thigh after the radiotracer. CT
data was obtained and used for attenuation correction and anatomic
localization.
FASTING BLOOD GLUCOSE:  Value: 126 mg/dl

[Series 3: pet sk_thigh ac · axial · 5.0mm · 4.07mm/px · z∈[-1265,-349]mm · 4 of 230 slices shown]
[im 1/230]
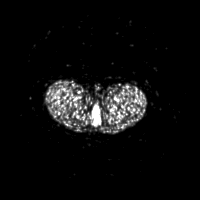
[im 77/230]
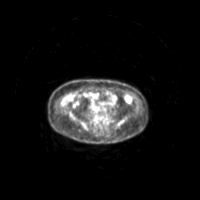
[im 153/230]
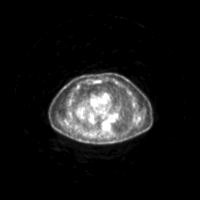
[im 230/230]
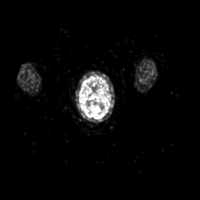

[Series 4: ct sk_thigh 5.0 b31f · axial · 5.0mm · 0.98mm/px · z∈[-1265,-349]mm · 5 of 230 slices shown]
[im 1/230]
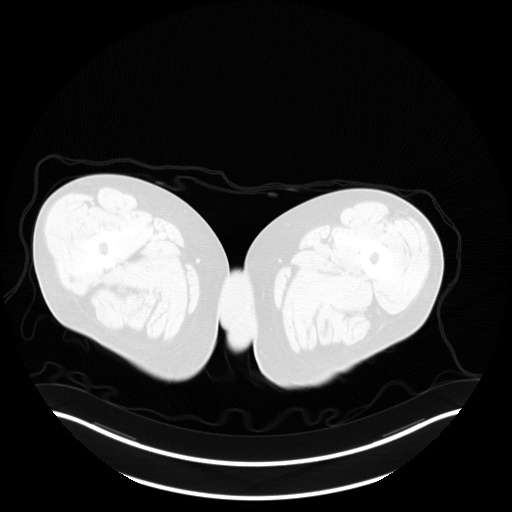
[im 58/230]
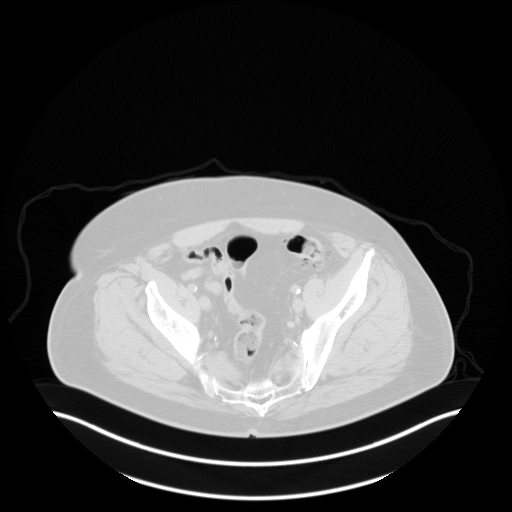
[im 115/230]
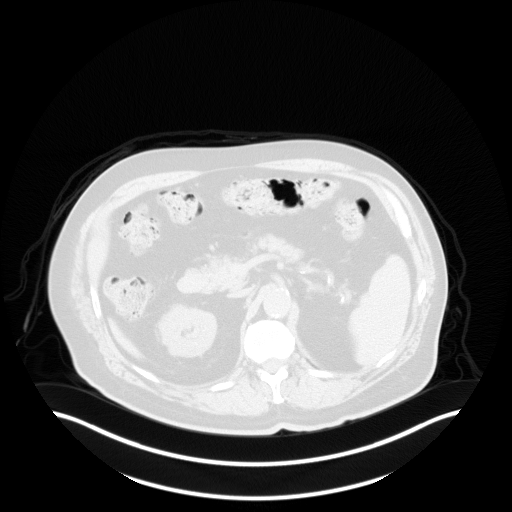
[im 172/230]
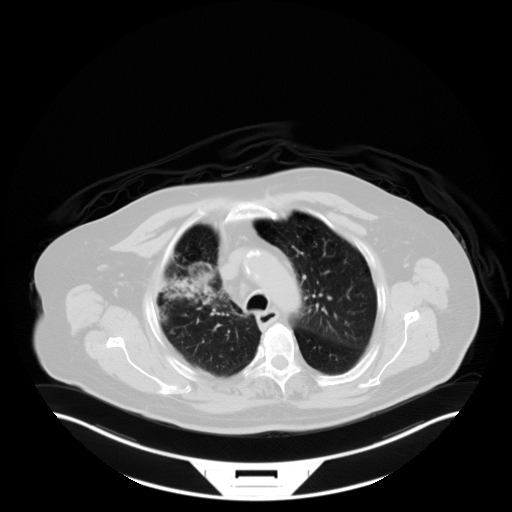
[im 230/230  brain]
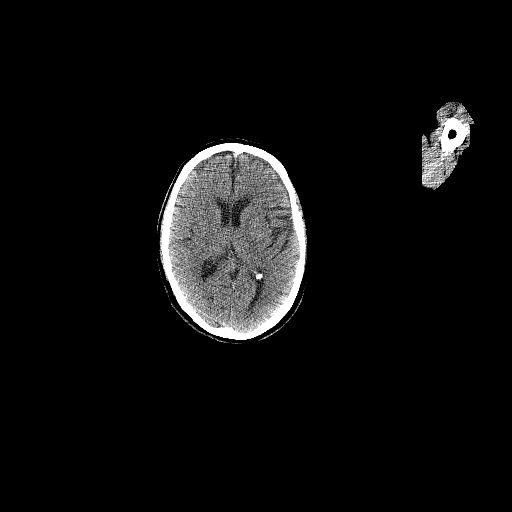

[Series 5: pet sk_thigh nac · axial · 5.0mm · 4.07mm/px · z∈[-1265,-349]mm · 5 of 230 slices shown]
[im 1/230]
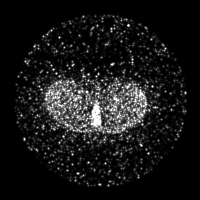
[im 58/230]
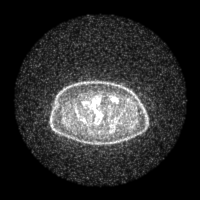
[im 115/230]
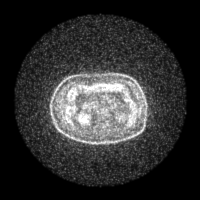
[im 172/230]
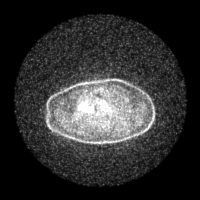
[im 230/230]
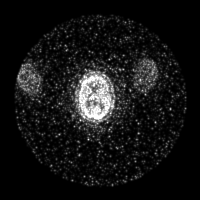

[Series 8: ct sk_thigh 5.0 b70f (id)_bone · axial · 5.0mm · 0.71mm/px · z∈[-805,-485]mm · 2 of 81 slices shown]
[im 1/81  bone]
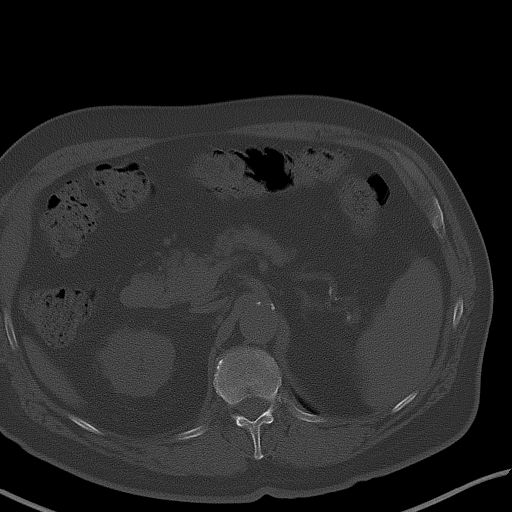
[im 81/81  bone]
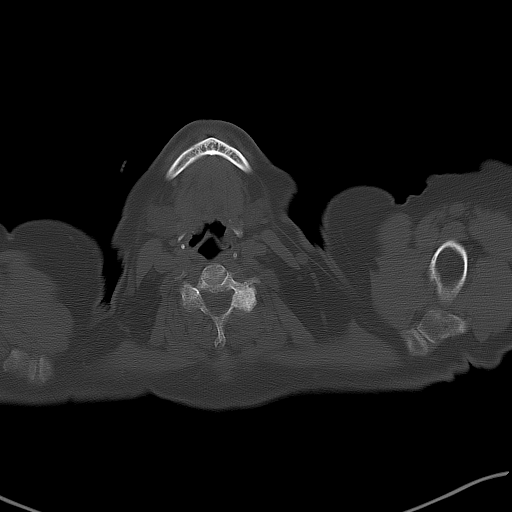

[Series 603: mip range · coronal · 1.90mm/px · 1 of 32 slices shown]
[im 1/32]
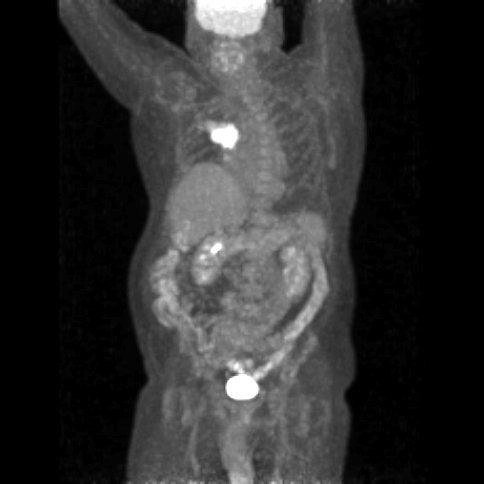

[Series 605: range-ct sk_thigh 5.0 (id)<alpha range(1)> · 2 of 79 slices shown]
[im 1/79]
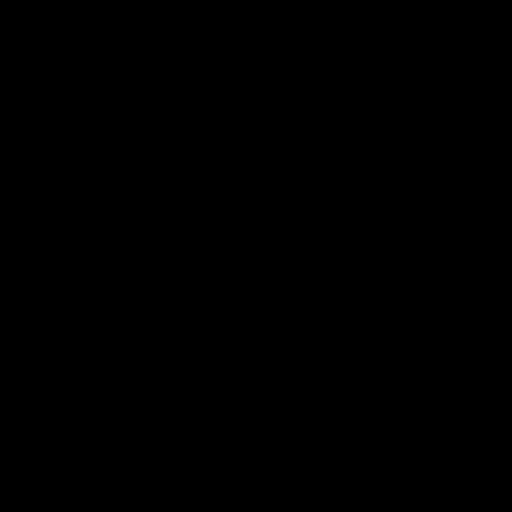
[im 79/79]
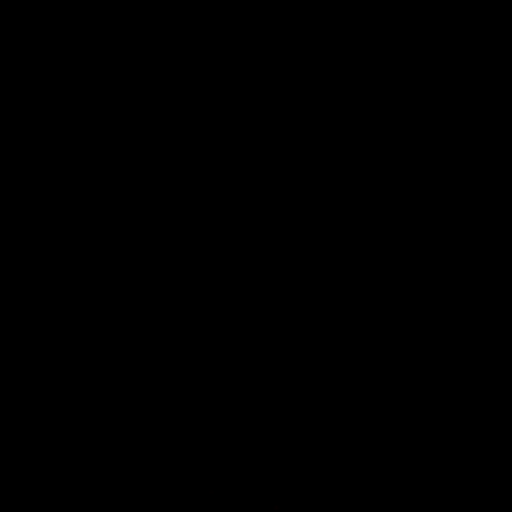

[Series 606: range-ct sk_thigh 5.0 (id)<alpha range> · 5 of 220 slices shown]
[im 1/220]
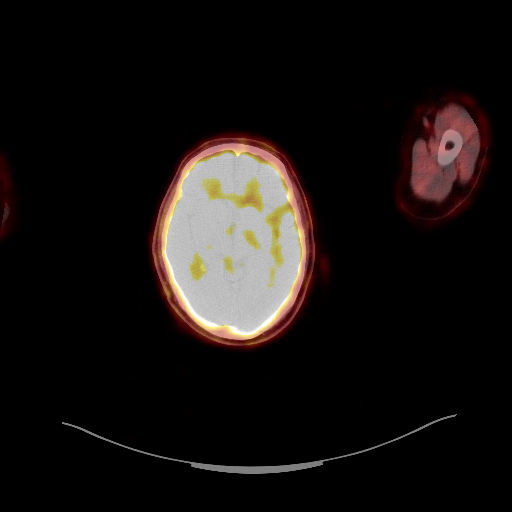
[im 55/220]
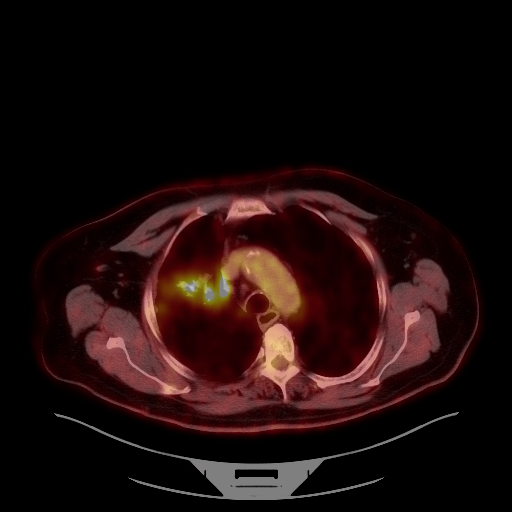
[im 110/220]
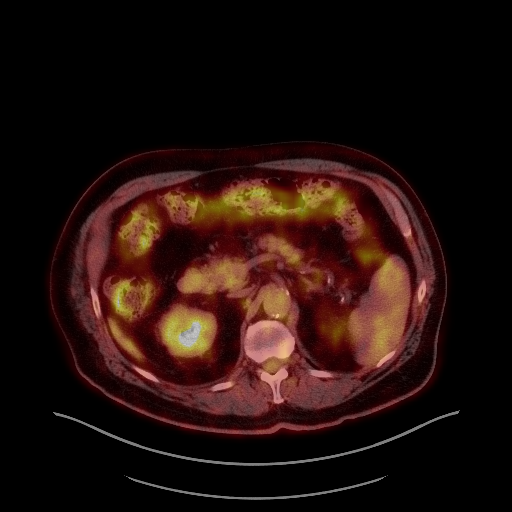
[im 165/220]
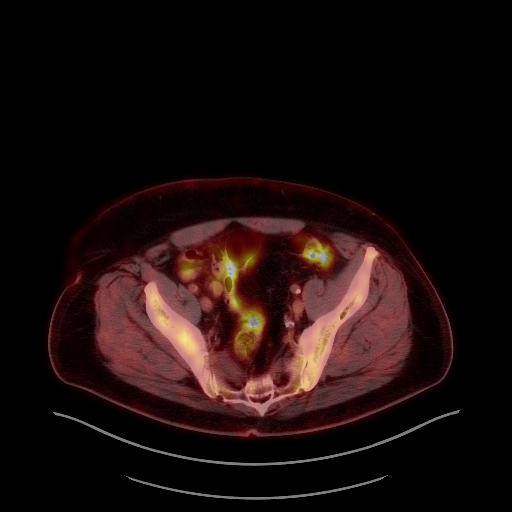
[im 220/220]
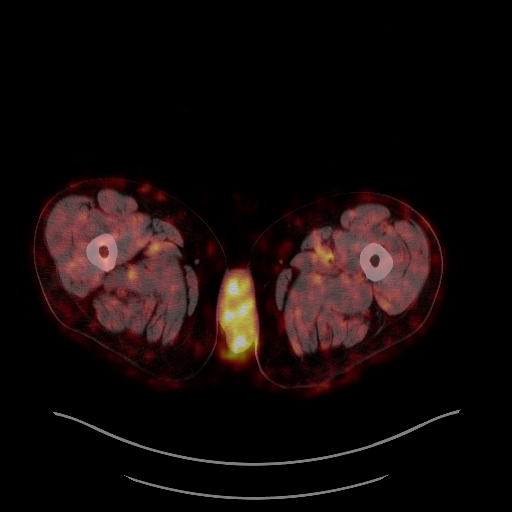

[Series 1025: results mm oncology reading · 4.0mm · 1.06mm/px · 1 of 5 slices shown]
[im 1/5]
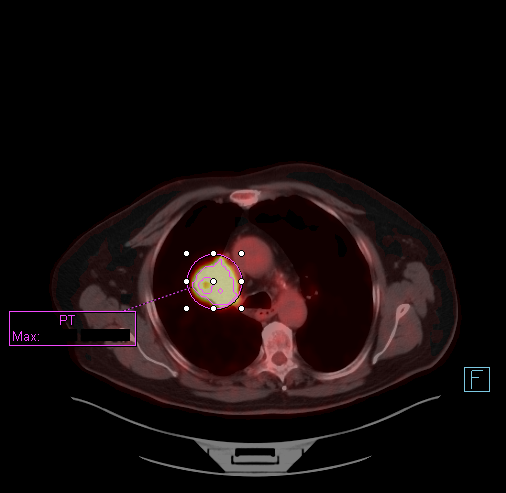

[25 of 25 positions shown; findings below may reference images not displayed]

FINDINGS: NECK: No areas of abnormal hypermetabolism. Bilateral carotid
atherosclerosis. No cervical adenopathy.

CHEST: Hypermetabolic central right upper lobe lung mass which
measures 5.0 cm and a S.U.V. max of 22.2, including on image
30/series a 8. This is similar in size to on the prior diagnostic
CT.

Surrounding presumed postobstructive pneumonitis. A more nodular
hypermetabolic component laterally including at a S.U.V. max of
on image 27/ series 8 could represent tumor extension or
postobstructive pneumonitis.

A focus of right hilar hypermetabolism measures a S.U.V. max of 4.5,
including on image 76/series 4. This is favored to correspond to a
node on image 162/series 3 of the prior diagnostic CT

Right mediastinal node measures 7 mm and a S.U.V. max of 3.0 on
image 74/series 4.

Multivessel coronary artery atherosclerosis. Tiny hiatal hernia.
Centrilobular and paraseptal emphysema. Scattered pulmonary nodules,
below PET resolution. These are grossly similar and nonspecific.
Example in the right middle lobe at 6 mm on image 45/series 8.

ABDOMEN/PELVIS: No abdominopelvic parenchymal or nodal
hypermetabolism. Punctate right renal collecting system calculi.
Normal adrenal glands. Abdominal aortic and branch vessel
atherosclerosis is advanced. Mild prostatomegaly.

SKELETON: No abnormal marrow activity.
IMPRESSION: 1. Hypermetabolic right upper lobe lung mass, consistent with
primary bronchogenic carcinoma. Surrounding postobstructive
pneumonitis.
2. Probable right hilar/infrahilar nodal metastasis. Equivocal
activity within a non pathologically enlarged right mediastinum.
3. No evidence of extrathoracic metastatic disease.
4. Nonspecific pulmonary nodules.
5. Coronary artery atherosclerosis. Aortic Atherosclerosis
(6G402-EC8.8).
6. Right nephrolithiasis.
